# Patient Record
Sex: Female | Born: 1945 | Race: White | Hispanic: No | Smoking: Current every day smoker
Health system: Southern US, Community
[De-identification: ages and names within clinical notes are randomized; demographics above are authoritative.]

## PROBLEM LIST (undated history)

## (undated) DIAGNOSIS — I739 Peripheral vascular disease, unspecified: Secondary | ICD-10-CM

## (undated) DIAGNOSIS — T8859XA Other complications of anesthesia, initial encounter: Secondary | ICD-10-CM

## (undated) DIAGNOSIS — H3530 Unspecified macular degeneration: Secondary | ICD-10-CM

## (undated) DIAGNOSIS — K5790 Diverticulosis of intestine, part unspecified, without perforation or abscess without bleeding: Secondary | ICD-10-CM

## (undated) DIAGNOSIS — D509 Iron deficiency anemia, unspecified: Secondary | ICD-10-CM

## (undated) DIAGNOSIS — K2270 Barrett's esophagus without dysplasia: Secondary | ICD-10-CM

## (undated) DIAGNOSIS — K439 Ventral hernia without obstruction or gangrene: Secondary | ICD-10-CM

## (undated) DIAGNOSIS — Z86010 Personal history of colonic polyps: Secondary | ICD-10-CM

## (undated) DIAGNOSIS — R002 Palpitations: Secondary | ICD-10-CM

## (undated) DIAGNOSIS — Z972 Presence of dental prosthetic device (complete) (partial): Secondary | ICD-10-CM

## (undated) DIAGNOSIS — A0472 Enterocolitis due to Clostridium difficile, not specified as recurrent: Secondary | ICD-10-CM

## (undated) DIAGNOSIS — F419 Anxiety disorder, unspecified: Secondary | ICD-10-CM

## (undated) DIAGNOSIS — I4891 Unspecified atrial fibrillation: Secondary | ICD-10-CM

## (undated) DIAGNOSIS — G4700 Insomnia, unspecified: Secondary | ICD-10-CM

## (undated) DIAGNOSIS — K589 Irritable bowel syndrome without diarrhea: Secondary | ICD-10-CM

## (undated) DIAGNOSIS — I2510 Atherosclerotic heart disease of native coronary artery without angina pectoris: Secondary | ICD-10-CM

## (undated) DIAGNOSIS — I6529 Occlusion and stenosis of unspecified carotid artery: Secondary | ICD-10-CM

## (undated) DIAGNOSIS — I779 Disorder of arteries and arterioles, unspecified: Secondary | ICD-10-CM

## (undated) DIAGNOSIS — Z87440 Personal history of urinary (tract) infections: Secondary | ICD-10-CM

## (undated) DIAGNOSIS — F32A Depression, unspecified: Secondary | ICD-10-CM

## (undated) DIAGNOSIS — R011 Cardiac murmur, unspecified: Secondary | ICD-10-CM

## (undated) DIAGNOSIS — M47819 Spondylosis without myelopathy or radiculopathy, site unspecified: Secondary | ICD-10-CM

## (undated) DIAGNOSIS — N281 Cyst of kidney, acquired: Secondary | ICD-10-CM

## (undated) DIAGNOSIS — H409 Unspecified glaucoma: Secondary | ICD-10-CM

## (undated) DIAGNOSIS — Z860101 Personal history of adenomatous and serrated colon polyps: Secondary | ICD-10-CM

## (undated) DIAGNOSIS — Z7982 Long term (current) use of aspirin: Secondary | ICD-10-CM

## (undated) DIAGNOSIS — E119 Type 2 diabetes mellitus without complications: Secondary | ICD-10-CM

## (undated) DIAGNOSIS — J45909 Unspecified asthma, uncomplicated: Secondary | ICD-10-CM

## (undated) DIAGNOSIS — R7303 Prediabetes: Secondary | ICD-10-CM

## (undated) DIAGNOSIS — I700 Atherosclerosis of aorta: Secondary | ICD-10-CM

## (undated) DIAGNOSIS — E785 Hyperlipidemia, unspecified: Secondary | ICD-10-CM

## (undated) DIAGNOSIS — M51379 Other intervertebral disc degeneration, lumbosacral region without mention of lumbar back pain or lower extremity pain: Secondary | ICD-10-CM

## (undated) DIAGNOSIS — J449 Chronic obstructive pulmonary disease, unspecified: Secondary | ICD-10-CM

## (undated) DIAGNOSIS — M1990 Unspecified osteoarthritis, unspecified site: Secondary | ICD-10-CM

## (undated) DIAGNOSIS — K219 Gastro-esophageal reflux disease without esophagitis: Secondary | ICD-10-CM

## (undated) DIAGNOSIS — Q2730 Arteriovenous malformation, site unspecified: Secondary | ICD-10-CM

## (undated) DIAGNOSIS — R3129 Other microscopic hematuria: Secondary | ICD-10-CM

## (undated) HISTORY — DX: Cyst of kidney, acquired: N28.1

## (undated) HISTORY — DX: Enterocolitis due to Clostridium difficile, not specified as recurrent: A04.72

## (undated) HISTORY — PX: BUNIONECTOMY: SHX129

## (undated) HISTORY — DX: Arteriovenous malformation, site unspecified: Q27.30

## (undated) HISTORY — DX: Barrett's esophagus without dysplasia: K22.70

## (undated) HISTORY — DX: Gastro-esophageal reflux disease without esophagitis: K21.9

## (undated) HISTORY — DX: Personal history of adenomatous and serrated colon polyps: Z86.0101

## (undated) HISTORY — PX: UPPER GASTROINTESTINAL ENDOSCOPY: SHX188

## (undated) HISTORY — DX: Insomnia, unspecified: G47.00

## (undated) HISTORY — PX: CHOLECYSTECTOMY: SHX55

## (undated) HISTORY — DX: Diverticulosis of intestine, part unspecified, without perforation or abscess without bleeding: K57.90

## (undated) HISTORY — DX: Cardiac murmur, unspecified: R01.1

## (undated) HISTORY — PX: TUBAL LIGATION: SHX77

## (undated) HISTORY — DX: Personal history of colonic polyps: Z86.010

## (undated) HISTORY — DX: Peripheral vascular disease, unspecified: I73.9

## (undated) HISTORY — DX: Iron deficiency anemia, unspecified: D50.9

## (undated) HISTORY — PX: CERVICAL CONE BIOPSY: SUR198

## (undated) HISTORY — DX: Irritable bowel syndrome, unspecified: K58.9

## (undated) HISTORY — PX: CATARACT EXTRACTION: SUR2

## (undated) HISTORY — DX: Hyperlipidemia, unspecified: E78.5

## (undated) HISTORY — DX: Other microscopic hematuria: R31.29

## (undated) HISTORY — DX: Occlusion and stenosis of unspecified carotid artery: I65.29

## (undated) HISTORY — PX: COLONOSCOPY: SHX174

## (undated) HISTORY — PX: FOOT SURGERY: SHX648

---

## 2004-05-20 ENCOUNTER — Ambulatory Visit: Payer: Self-pay | Admitting: Internal Medicine

## 2005-06-11 ENCOUNTER — Ambulatory Visit: Payer: Self-pay | Admitting: Internal Medicine

## 2005-08-03 ENCOUNTER — Ambulatory Visit: Payer: Self-pay | Admitting: Internal Medicine

## 2005-08-09 DIAGNOSIS — K5520 Angiodysplasia of colon without hemorrhage: Secondary | ICD-10-CM

## 2005-08-09 HISTORY — DX: Angiodysplasia of colon without hemorrhage: K55.20

## 2005-09-17 ENCOUNTER — Ambulatory Visit: Payer: Self-pay | Admitting: Gastroenterology

## 2005-09-17 ENCOUNTER — Encounter (INDEPENDENT_AMBULATORY_CARE_PROVIDER_SITE_OTHER): Payer: Self-pay | Admitting: *Deleted

## 2005-11-05 ENCOUNTER — Ambulatory Visit: Payer: Self-pay | Admitting: Internal Medicine

## 2005-11-18 ENCOUNTER — Ambulatory Visit: Payer: Self-pay | Admitting: Internal Medicine

## 2006-02-05 ENCOUNTER — Ambulatory Visit: Payer: Self-pay | Admitting: General Practice

## 2006-05-24 ENCOUNTER — Encounter (INDEPENDENT_AMBULATORY_CARE_PROVIDER_SITE_OTHER): Payer: Self-pay | Admitting: *Deleted

## 2006-08-05 ENCOUNTER — Ambulatory Visit: Payer: Self-pay | Admitting: Internal Medicine

## 2007-06-29 ENCOUNTER — Ambulatory Visit: Payer: Self-pay | Admitting: Internal Medicine

## 2007-07-11 ENCOUNTER — Ambulatory Visit: Payer: Self-pay | Admitting: Internal Medicine

## 2007-07-11 ENCOUNTER — Encounter: Payer: Self-pay | Admitting: Internal Medicine

## 2007-08-08 ENCOUNTER — Ambulatory Visit: Payer: Self-pay | Admitting: Internal Medicine

## 2007-08-28 ENCOUNTER — Ambulatory Visit: Payer: Self-pay | Admitting: Internal Medicine

## 2007-08-31 LAB — CONVERTED CEMR LAB
Basophils Absolute: 0.1 10*3/uL (ref 0.0–0.1)
Basophils Relative: 0.6 % (ref 0.0–1.0)
Eosinophils Absolute: 0.1 10*3/uL (ref 0.0–0.6)
Eosinophils Relative: 1.6 % (ref 0.0–5.0)
HCT: 37.9 % (ref 36.0–46.0)
Hemoglobin: 13.3 g/dL (ref 12.0–15.0)
Iron: 62 ug/dL (ref 42–145)
Lymphocytes Relative: 21.9 % (ref 12.0–46.0)
MCHC: 35.1 g/dL (ref 30.0–36.0)
MCV: 94 fL (ref 78.0–100.0)
Monocytes Absolute: 0.7 10*3/uL (ref 0.2–0.7)
Monocytes Relative: 8.7 % (ref 3.0–11.0)
Neutro Abs: 5.7 10*3/uL (ref 1.4–7.7)
Neutrophils Relative %: 67.2 % (ref 43.0–77.0)
Platelets: 387 10*3/uL (ref 150–400)
RBC: 4.03 M/uL (ref 3.87–5.11)
RDW: 12.7 % (ref 11.5–14.6)
Saturation Ratios: 16.5 % — ABNORMAL LOW (ref 20.0–50.0)
Transferrin: 269 mg/dL (ref >=212.0)
WBC: 8.5 10*3/uL (ref 4.5–10.5)

## 2007-09-05 DIAGNOSIS — D509 Iron deficiency anemia, unspecified: Secondary | ICD-10-CM | POA: Insufficient documentation

## 2007-09-05 DIAGNOSIS — Q279 Congenital malformation of peripheral vascular system, unspecified: Secondary | ICD-10-CM | POA: Insufficient documentation

## 2007-09-05 DIAGNOSIS — K2270 Barrett's esophagus without dysplasia: Secondary | ICD-10-CM | POA: Insufficient documentation

## 2007-09-05 DIAGNOSIS — K5730 Diverticulosis of large intestine without perforation or abscess without bleeding: Secondary | ICD-10-CM | POA: Insufficient documentation

## 2007-09-05 DIAGNOSIS — K589 Irritable bowel syndrome without diarrhea: Secondary | ICD-10-CM | POA: Insufficient documentation

## 2007-09-05 DIAGNOSIS — D126 Benign neoplasm of colon, unspecified: Secondary | ICD-10-CM | POA: Insufficient documentation

## 2007-09-05 DIAGNOSIS — E785 Hyperlipidemia, unspecified: Secondary | ICD-10-CM | POA: Insufficient documentation

## 2007-10-12 ENCOUNTER — Ambulatory Visit: Payer: Self-pay | Admitting: Internal Medicine

## 2007-10-12 LAB — CONVERTED CEMR LAB
Basophils Absolute: 0 10*3/uL (ref 0.0–0.1)
Basophils Relative: 0.3 % (ref 0.0–1.0)
Eosinophils Absolute: 0.2 10*3/uL (ref 0.0–0.6)
Eosinophils Relative: 1.5 % (ref 0.0–5.0)
HCT: 40.1 % (ref 36.0–46.0)
Hemoglobin: 13.5 g/dL (ref 12.0–15.0)
Lymphocytes Relative: 21.2 % (ref 12.0–46.0)
MCHC: 33.8 g/dL (ref 30.0–36.0)
MCV: 92.7 fL (ref 78.0–100.0)
Monocytes Absolute: 0.8 10*3/uL — ABNORMAL HIGH (ref 0.2–0.7)
Monocytes Relative: 7.8 % (ref 3.0–11.0)
Neutro Abs: 7 10*3/uL (ref 1.4–7.7)
Neutrophils Relative %: 69.2 % (ref 43.0–77.0)
Platelets: 379 10*3/uL (ref 150–400)
RBC: 4.33 M/uL (ref 3.87–5.11)
RDW: 13.5 % (ref 11.5–14.6)
WBC: 10.1 10*3/uL (ref 4.5–10.5)

## 2008-01-09 ENCOUNTER — Ambulatory Visit: Payer: Self-pay | Admitting: Internal Medicine

## 2008-01-12 LAB — CONVERTED CEMR LAB
Basophils Absolute: 0.1 10*3/uL (ref 0.0–0.1)
Basophils Relative: 1.4 % — ABNORMAL HIGH (ref 0.0–1.0)
Eosinophils Absolute: 0.2 10*3/uL (ref 0.0–0.7)
Eosinophils Relative: 2.4 % (ref 0.0–5.0)
HCT: 41 % (ref 36.0–46.0)
Hemoglobin: 14.3 g/dL (ref 12.0–15.0)
Lymphocytes Relative: 17.7 % (ref 12.0–46.0)
MCHC: 34.9 g/dL (ref 30.0–36.0)
MCV: 95.8 fL (ref 78.0–100.0)
Monocytes Absolute: 0.8 10*3/uL (ref 0.1–1.0)
Monocytes Relative: 8.8 % (ref 3.0–12.0)
Neutro Abs: 6.1 10*3/uL (ref 1.4–7.7)
Neutrophils Relative %: 69.7 % (ref 43.0–77.0)
Platelets: 373 10*3/uL (ref 150–400)
RBC: 4.28 M/uL (ref 3.87–5.11)
RDW: 12.8 % (ref 11.5–14.6)
WBC: 8.7 10*3/uL (ref 4.5–10.5)

## 2008-04-04 ENCOUNTER — Ambulatory Visit: Payer: Self-pay | Admitting: Urology

## 2008-08-22 ENCOUNTER — Ambulatory Visit: Payer: Self-pay | Admitting: Internal Medicine

## 2008-08-26 ENCOUNTER — Ambulatory Visit: Payer: Self-pay | Admitting: Internal Medicine

## 2008-08-26 LAB — CONVERTED CEMR LAB
Basophils Absolute: 0 10*3/uL (ref 0.0–0.1)
Basophils Relative: 0.2 % (ref 0.0–3.0)
Eosinophils Absolute: 0.2 10*3/uL (ref 0.0–0.7)
Eosinophils Relative: 1.9 % (ref 0.0–5.0)
HCT: 39.9 % (ref 36.0–46.0)
Hemoglobin: 13.6 g/dL (ref 12.0–15.0)
Lymphocytes Relative: 18.2 % (ref 12.0–46.0)
MCHC: 34 g/dL (ref 30.0–36.0)
MCV: 94.1 fL (ref 78.0–100.0)
Monocytes Absolute: 0.9 10*3/uL (ref 0.1–1.0)
Monocytes Relative: 10 % (ref 3.0–12.0)
Neutro Abs: 5.9 10*3/uL (ref 1.4–7.7)
Neutrophils Relative %: 69.7 % (ref 43.0–77.0)
Platelets: 384 10*3/uL (ref 150–400)
RBC: 4.24 M/uL (ref 3.87–5.11)
RDW: 13.5 % (ref 11.5–14.6)
WBC: 8.5 10*3/uL (ref 4.5–10.5)

## 2008-08-28 ENCOUNTER — Ambulatory Visit: Payer: Self-pay | Admitting: Internal Medicine

## 2009-06-04 ENCOUNTER — Encounter (INDEPENDENT_AMBULATORY_CARE_PROVIDER_SITE_OTHER): Payer: Self-pay | Admitting: *Deleted

## 2009-07-08 ENCOUNTER — Ambulatory Visit: Payer: Self-pay | Admitting: Internal Medicine

## 2009-08-15 ENCOUNTER — Telehealth: Payer: Self-pay | Admitting: Internal Medicine

## 2009-08-15 ENCOUNTER — Ambulatory Visit: Payer: Self-pay | Admitting: Internal Medicine

## 2009-08-19 ENCOUNTER — Encounter: Payer: Self-pay | Admitting: Internal Medicine

## 2009-08-25 ENCOUNTER — Ambulatory Visit: Payer: Self-pay | Admitting: Internal Medicine

## 2010-09-08 NOTE — Procedures (Signed)
Summary: Gastroenterology EGD  Gastroenterology EGD   Imported By: Darcey Nora RN 09/06/2007 10:29:49  _____________________________________________________________________  External Attachment:    Type:   Image     Comment:   External Document

## 2010-09-08 NOTE — Procedures (Signed)
Summary: Gastroenterology-Capsule Endo  Gastroenterology-Capsule Endo   Imported By: Hortense Ramal CMA 09/02/2008 08:50:22  _____________________________________________________________________  External Attachment:    Type:   Image     Comment:   External Document

## 2010-09-08 NOTE — Procedures (Signed)
Summary: Colonoscopy  Patient: Yolanda Jimenez Note: All result statuses are Final unless otherwise noted.  Tests: (1) Colonoscopy (COL)   COL Colonoscopy           DONE (C)     [facility]     [address]     [place], [place]  [address]           COLONOSCOPY PROCEDURE REPORT           PATIENT:  Yolanda Jimenez, Yolanda Jimenez  MR#:  #[id number]     BIRTHDATE:  10-05-1945, 63 yrs. old  GENDER:           ENDOSCOPIST:  [name]. Juanda Chance, MD     Referred by:  Bethann Punches, M.D.           PROCEDURE DATE:  08/15/2009     PROCEDURE:  Colonoscopy [address]     ASA CLASS:  Class I     INDICATIONS:  history of pre-cancerous (adenomatous) colon polyps     adenomatous polyp in 2007     diarrhea, likely post-cholecystectomy           MEDICATIONS:   There was residual sedation effect present from     prior procedure. 3 mg, Fentanyl 25 mcg           DESCRIPTION OF PROCEDURE:   After the risks benefits and     alternatives of the procedure were thoroughly explained, informed     consent was obtained.  Digital rectal exam was performed and     revealed no rectal masses.   The LB [id number] endoscope     was introduced through the anus and advanced to the cecum, which     was identified by both the appendix and ileocecal valve, without     limitations.  The quality of the prep was good, using MiraLax.     The instrument was then slowly withdrawn as the colon was fully     examined.     <<PROCEDUREIMAGES>>           FINDINGS:  A sessile polyp was found in the ascending colon. 1 cm     sessile polyp removed from right colon Polyp was snared without     cautery. Retrieval was successful (see image7 and image8). snare     polyp  Moderate diverticulosis was found (see image9 and image1).     This was otherwise a normal examination of the colon. Random     biopsies were obtained and sent to pathology (see image2, image3,     image4, image5, and image10). r/o microscopic colitis     Retroflexed views in  the rectum revealed no abnormalities.    The     scope was then withdrawn from the patient and the procedure     completed.           COMPLICATIONS:  None           ENDOSCOPIC IMPRESSION:     1) Sessile polyp in the ascending colon     2) Moderate diverticulosis     3) Otherwise normal examination     s/p random biopsies to r/o microscopic colitis     RECOMMENDATIONS:     1) Await pathology results     Questran 4 gm qd, #30 packets, 3 refills           REPEAT EXAM:  In 5 year(s) for.  ______________________________     [name]. Juanda Chance, MD           CC:           n.     REVISED:  09/01/2009 09:58 AM     eSIGNED:   [name]. Coreon Simkins at 09/01/2009 09:58 AM           Page 2 of 3   Aleda Grana, #[id number] L3  Note: An exclamation mark (!) indicates a result that was not dispersed into the flowsheet. Document Creation Date: 09/01/2009 9:58 AM _______________________________________________________________________  (1) Order result status: Final Collection or observation date-time: 08/15/2009 11:22 Requested date-time:  Receipt date-time:  Reported date-time:  Referring Physician:   Ordering Physician: [name] [phone]) Specimen Source:  Source: Launa Grill Order Number: [id number] Lab site:

## 2010-09-08 NOTE — Assessment & Plan Note (Signed)
Summary: consult before ecl diarrhea, frequent heartburn..em    History of Present Illness Visit Type: Follow-up Visit Primary GI MD: [name] MD Primary Provider: Bethann Punches, MD Chief Complaint: Patient is here for consult before ECL. She is having frequent loose stools mostly in the morning. Takes 5 trips to the bathroom to complete a BM to get her day started. She has some increased reflux symptoms and is needing to take some Tums at night as needd. She has noticed a questionable hernia upper abd she woul like Dr Juanda Chance to take a look at today. She denies any abdominal pain. History of Present Illness:   This is a 65 year old white female with Barrett's esophagus which was diagnosed on an upper endoscopy in [place] in 2007 and confirmed on a subsequent upper endoscopy in December 2008 at our office. 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 Patient had iron deficiency anemia which was corrected with iron supplements. Patient's hemoglobin done every 3 months in 2009 remained stable at 13.3-14.3. She has eliminated Motrin and anti-inflammatory medications. She continues to smoke. Patient has diarrhea since her cholecystectomy in 1997. She takes Prilosec 20 mg in the morning and another one sometimes at night. Patient comes today to discuss having an endoscopy and colonoscopy. She complains of frequent loose stools and increased reflux symptoms. She denies any abdominal discomfort or blood in the stool.    GI Review of Systems    Reports acid reflux.      Denies abdominal pain, belching, bloating, chest pain, dysphagia with liquids, dysphagia with solids, heartburn, loss of appetite, nausea, vomiting, vomiting blood, weight loss, and  weight gain.        Denies anal fissure, black tarry stools, change in bowel habit, constipation, diarrhea, diverticulosis, fecal incontinence, heme positive stool, hemorrhoids, irritable bowel syndrome,  jaundice, light color stool, liver problems, rectal bleeding, and  rectal pain.  Preventive Screening-Counseling & Management  Alcohol-Tobacco     Smoking Status: quit    Current Medications (verified): 1)  Prilosec Otc 20 Mg Tbec (Omeprazole Magnesium) .... Take 1 Tablet By Mouth Once A Day and One At Night As Needed 2)  Simvastatin 40 Mg Tabs (Simvastatin) .... Take One By Mouth Once Daily 3)  Vitamin D 1000 Unit  Tabs (Cholecalciferol) .... Take One By Mouth Once Daily 4)  Vitamin C 1000 Mg Tabs (Ascorbic Acid) .... Take One By Mouth Once Daily 5)  Calcium Carbonate-Vitamin D 600-400 Mg-Unit  Tabs (Calcium Carbonate-Vitamin D) .... Take One By Mouth Once Daily 6)  Vesicare 5 Mg Tabs (Solifenacin Succinate) .... Take One By Mouth Once Daily 7)  Magnesium 500 Mg Tabs (Magnesium) .... Take One By Mouth Once Daily 8)  Tums 500 Mg Chew (Calcium Carbonate Antacid) .... Take One By Mouth As Needed  Allergies (verified): 1)  ! Codeine 2)  ! 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 quit November 13 2008 Smoking Status:  quit  Review of Systems  The patient complains of back pain, muscle pains/cramps, and urine leakage.  The patient denies allergy/sinus, anemia, anxiety-new, arthritis/joint pain, blood in urine, breast changes/lumps, change in vision,  confusion, cough, coughing up blood, depression-new, fainting, fatigue, fever, headaches-new, hearing problems, heart murmur, heart rhythm changes, itching, menstrual pain, night sweats, nosebleeds, pregnancy symptoms, shortness of breath, skin rash, sleeping problems, sore throat, swelling of feet/legs, swollen lymph glands, thirst - excessive , urination - excessive , urination changes/pain, vision changes, and voice change.         Pertinent positive and negative review of systems were noted in the above HPI. All other ROS was otherwise negative.   Vital Signs:  Patient profile:   65 year old female Height:      62 inches Weight:      152.2 pounds BMI:     27.94 Pulse rate:   68 / minute Pulse rhythm:   regular BP sitting:   124 / 74  (left arm) Cuff size:   regular  Vitals Entered By: Harlow Mares CMA Duncan Dull) (July 08, 2009 2:19 PM)  Physical Exam  General:  alert, oriented and in no distress. Eyes:  PERRLA, no icterus. Mouth:  No deformity or lesions, dentition normal. Neck:  Supple; no masses or thyromegaly. Lungs:  Clear throughout to auscultation. Heart:  Regular rate and rhythm; no murmurs, rubs,  or bruits. Abdomen:  small ventral hernia in the midline in epigastrium 2-3 cm in diameter overlying the post laparoscopic cholecystectomy scar. It is easily reducible and it is not tender. Extremities:  No clubbing, cyanosis, edema or deformities noted.   Impression & Recommendations:  Problem # 1:  COLONIC POLYPS, ADENOMATOUS (ICD-211.3)  Patient has a history of adenomatous polyps of the colon on several prior colonoscopies. The last one was done in February 2007. Patient is due for a repeat colonoscopy.  Orders: Colon/Endo (Colon/Endo)  Problem # 2:  BARRETTS ESOPHAGUS (ICD-530.85)  Patient had Barrett's esophagus on her last upper endoscopy and biopsies from December 2008. She is due for a repeat endoscopy. She needs to continue Prilosec 20 mg twice a  day.  Orders: Colon/Endo (Colon/Endo)  Patient Instructions: 1)  Prilosec 20 mg p.o. b.i.d. 2)  Schedule upper endoscopies and biopsies for followup of Barrett's. 3)  Schedule colonoscopy for followup of adenomatous colon polyps. 4)  Copy sent to : Dr Bethann Punches Prescriptions: DULCOLAX 5 MG  TBEC (BISACODYL) Day before procedure take 2 at 3pm and 2 at 8pm.  #4 x 0   Entered by:   Hortense Ramal CMA (AAMA)   Authorized by:   Hart Carwin MD   Signed by:   Hortense Ramal CMA (AAMA) on 07/08/2009   Method used:   Electronically to        [facility] [phone]* (retail)       [address] [place], [place]  [address]       Ph: [phone] or [phone]       Fax: [phone]   RxID:   [id number] REGLAN 10 MG  TABS (METOCLOPRAMIDE HCL) As per prep instructions.  #2 x 0   Entered by:   Hortense Ramal CMA (AAMA)   Authorized by:   Hart Carwin MD   Signed by:   Hortense Ramal CMA (AAMA) on 07/08/2009   Method used:   [facility]  [address] [phone]* (retail)       [address] [place], [place]  [address]       Ph: [phone] or [phone]       Fax: [phone]   RxID:   [id number] MIRALAX   POWD (POLYETHYLENE GLYCOL 3350) As per prep  instructions.  #255gm x 0   Entered by:   Hortense Ramal CMA (AAMA)   Authorized by:   Hart Carwin MD   Signed by:   Hortense Ramal CMA (AAMA) on 07/08/2009   Method used:   Electronically to        [facility] [phone]* (retail)       [address] [place], [place]  [address]       Ph: [phone] or [phone]       Fax: [phone]   RxID:   [id number] PRILOSEC OTC 20 MG TBEC (OMEPRAZOLE MAGNESIUM) Take 1 tablet by mouth two times a day  #60 x 4   Entered by:   Hortense Ramal CMA (AAMA)   Authorized by:   Hart Carwin MD   Signed by:   Hortense Ramal CMA (AAMA) on 07/08/2009   Method used:   Electronically to        [facility] [phone]* (retail)       [address] [place], [place]  [address]       Ph: [phone] or [phone]       Fax: [phone]   RxID:   [id number]

## 2010-09-08 NOTE — Letter (Signed)
Summary: [facility] Instructions  [facility]  [address] [place], [place] [address]   Phone: [phone]  Fax: [phone]       Yolanda Jimenez    May 03, 1946    MRN: [id number]       Procedure Day /Date: Friday 08/15/09     Arrival Time: 8:30 am     Procedure Time: 9:30 am     Location of Procedure:                    _x_  [facility] (4th Floor)   PREPARATION FOR COLONOSCOPY WITH MIRALAX  Starting 5 days prior to your procedure _(08/08/09) do not eat nuts, seeds, popcorn, corn, beans, peas,  salads, or any raw vegetables.  Do not take any fiber supplements (e.g. Metamucil, Citrucel, and Benefiber). ____________________________________________________________________________________________________   THE DAY BEFORE YOUR PROCEDURE         DATE: 08/14/09 DAY: Thursday  1   Drink clear liquids the entire day-NO SOLID FOOD  2   Do not drink anything colored red or purple.  Avoid juices with pulp.  No orange juice.  3   Drink at least 64 oz. (8 glasses) of fluid/clear liquids during the day to prevent dehydration and help the prep work efficiently.  CLEAR LIQUIDS INCLUDE: Water Jello Ice Popsicles Tea (sugar ok, no milk/cream) Powdered fruit flavored drinks Coffee (sugar ok, no milk/cream) Gatorade Juice: apple, white grape, white cranberry  Lemonade Clear bullion, consomm, broth Carbonated beverages (any kind) Strained chicken noodle soup Hard Candy  4   Mix the entire bottle of Miralax with 64 oz. of Gatorade/Powerade in the morning and put in the refrigerator to chill.  5   At 3:00 pm take 2 Dulcolax/Bisacodyl tablets.  6   At 4:30 pm take one Reglan/Metoclopramide tablet.  7  Starting at 5:00 pm drink one 8 oz glass of the Miralax mixture every 15-20 minutes until you have finished drinking the entire 64 oz.  You should finish drinking prep around 7:30 or 8:00 pm.  8   If you are nauseated, you may take the 2nd Reglan/Metoclopramide tablet  at 6:30 pm.        9    At 8:00 pm take 2 more DULCOLAX/Bisacodyl tablets.       THE DAY OF YOUR PROCEDURE      DATE:  08/15/09 DAY: Friday  You may drink clear liquids until 7:30 am  (2 HOURS BEFORE PROCEDURE).   MEDICATION INSTRUCTIONS  Unless otherwise instructed, you should take regular prescription medications with a small sip of water as early as possible the morning of your procedure.         OTHER INSTRUCTIONS  You will need a responsible adult at least 67 years of age to accompany you and drive you home.   This person must remain in the waiting room during your procedure.  Wear loose fitting clothing that is easily removed.  Leave jewelry and other valuables at home.  However, you may wish to bring a book to read or an iPod/MP3 player to listen to music as you wait for your procedure to start.  Remove all body piercing jewelry and leave at home.  Total time from sign-in until discharge is approximately 2-3 hours.  You should go home directly after your procedure and rest.  You can resume normal activities the day after your procedure.  The day of your procedure you should not:   Drive   Make  legal decisions   Operate machinery   Drink alcohol   Return to work  You will receive specific instructions about eating, activities and medications before you leave.   The above instructions have been reviewed and explained to me by   Hortense Ramal, CMA    I fully understand and can verbalize these instructions _____________________________ Date 07/08/09

## 2010-09-08 NOTE — Letter (Signed)
Summary: Endo/Colon Letter  [facility]  [address] [place], [place] [address]   Phone: [phone]  Fax: [phone]      June 04, 2009 MRN: [id number]   Yolanda Jimenez [address] [place], [place]  [address]   Dear Ms. Werst,   According to your medical record, it is time for you to schedule an Endoscopy/Colonoscopy . Endoscopic screeening is recommended for patients with certain upper digestive tract conditions because of associated increased risk for cancers of the upper digestive system. The American Cancer Society recommends Colonoscopy as a method to detect early colon cancer. Patients with a family history of colon cancer, or a personal history of colon polyps or inflammatory bowel disease are at increased risk.  This letter has been generated based on the recommendations made at the time of your prior procedure. If you feel that in your particular situation this may no longer apply, please contact our office.  Please call our office at [phone] to schedule this appointment or to update your records at your earliest convenience.  Thank you for cooperating with [place] to provide you with the very best care possible.   Sincerely,  [name]. Juanda Chance, M.D.  [facility] [phone]

## 2010-09-08 NOTE — Letter (Signed)
Summary: Patient Notice-Barrett's [facility]  [address] [place], [place] [address]   Phone: [phone]  Fax: [phone]    x    August 19, 2009 MRN: [id number]    Yolanda Jimenez [address] [place], [place]  [address]    Dear Yolanda Jimenez,  I am pleased to inform you that the biopsies taken during your recent endoscopic examination did not show any evidence of cancer upon pathologic examination.  However, your biopsies indicate you have a condition known as Barrett's esophagus. While not cancer, it is pre-cancerous (can progress to cancer) and needs to be monitored with repeat endoscopic examination and biopsies.  Fortunately, it is quite rare that this develops into cancer, but careful monitoring of the condition along with taking your medication as prescribed is important in reducing the risk of developing cancer.  It is my recommendation that you have a repeat upper gastrointestinal endoscopic examination in _ years.  Additional information/recommendations:  __Please call [phone] to schedule a return visit to further      evaluate your condition.  __Continue with treatment plan as outlined the day of your exam.  Please call us if you have or develop heartburn, reflux symptoms, any swallowing problems, or if you have questions about your condition that have not been fully answered at this time.  Sincerely,  Hart Carwin MD  This letter has been electronically signed by your physician.  Appended Document: Patient Notice-Barrett's Esopghagus August 19, 2009 MRN: [id number]    [facility] [address] [place], [place]  [address]     Dear Yolanda Jimenez,  I am pleased to inform you that the biopsies taken during your recent endoscopic examination did not show any evidence of cancer upon pathologic examination.  However, your biopsies indicate you have a condition known as Barrett's esophagus. While not cancer, it is  pre-cancerous (can progress to cancer) and needs to be monitored with repeat endoscopic examination and biopsies.  Fortunately, it is quite rare that this develops into cancer, but careful monitoring of the condition along with taking your medication as prescribed is important in reducing the risk of developing cancer.  It is my recommendation that you have a repeat upper gastrointestinal endoscopic examination in 2 years.  Additional information/recommendations:  __Please call [phone] to schedule a return visit to further      evaluate your condition.  __Continue with treatment plan as outlined the day of your exam.  Please call us if you have or develop heartburn, reflux symptoms, any swallowing problems, or if you have questions about your condition that have not been fully answered at this time.  Sincerely,  Hart Carwin MD  This letter has been electronically signed by your physician.   Letter mailed 01.12.11

## 2010-09-08 NOTE — Letter (Signed)
Summary: Patient Notice- Colon Biospy Results  [facility]  [address] [place], [place] [address]   Phone: [phone]  Fax: [phone]        August 19, 2009 MRN: [id number]    TAMZIN BERTLING [address] [place], [place]  [address]    Dear Ms. Bhat,  I am pleased to inform you that the biopsies taken during your recent colonoscopy did not show any evidence of cancer upon pathologic examination.The polyp was adenomatous ( precancerous)  Additional information/recommendations:  _x_No further action is needed at this time.  Please follow-up with      your primary care physician for your other healthcare needs.  __Please call [phone] to schedule a return visit to review      your condition.  __Continue with the treatment plan as outlined on the day of your      exam.  _x_You should have a repeat colonoscopy examination for this problem           in _5  years.  Please call us if you are having persistent problems or have questions about your condition that have not been fully answered at this time.  Sincerely,  Hart Carwin MD   This letter has been electronically signed by your physician.  Appended Document: Patient Notice- Colon Biospy Results Letter mailed 01.12.11

## 2010-09-08 NOTE — Progress Notes (Signed)
Summary: pain in abdomen, passed blood   Phone Note Call from Patient   Caller: Patient Summary of Call: colonoscopy and egd today, notes reviewed some moderate crampy lower abd pain, T99, passed mucous with streaks, clump of blood, about a teaspoon no back pain ate a hamburger earlier rec: clear liquids, observe, notify me if T> 100.5 or worse if ok tomorrow resume normal diet for her Initial call taken by: Iva Boop MD, Clementeen Graham,  August 15, 2009 7:45 PM

## 2010-09-08 NOTE — Procedures (Signed)
Summary: Colonoscopy  Patient: Yolanda Jimenez Note: All result statuses are Final unless otherwise noted.  Tests: (1) Colonoscopy (COL)   COL Colonoscopy           DONE     [facility]     [address]     [place], [place]  [address]           COLONOSCOPY PROCEDURE REPORT           PATIENT:  Yolanda Jimenez, Yolanda Jimenez  MR#:  #[id number] L3     BIRTHDATE:  ,  yrs. old  GENDER:           ENDOSCOPIST:  [name]. Juanda Chance, MD     Referred by:  Bethann Punches, M.D.           PROCEDURE DATE:  08/15/2009     PROCEDURE:  Colonoscopy 347[address]     ASA CLASS:  Class I     INDICATIONS:  history of pre-cancerous (adenomatous) colon polyps     adenomatous polyp in 2007     diarrhea, likely post-cholecystectomy           MEDICATIONS:   There was residual sedation effect present from     prior procedure. 8 mg, Fentanyl 75 mcg           DESCRIPTION OF PROCEDURE:   After the risks benefits and     alternatives of the procedure were thoroughly explained, informed     consent was obtained.  Digital rectal exam was performed and     revealed no rectal masses.   The LB CF-H180AL [id number] endoscope     was introduced through the anus and advanced to the cecum, which     was identified by both the appendix and ileocecal valve, without     limitations.  The quality of the prep was good, using MiraLax.     The instrument was then slowly withdrawn as the colon was fully     examined.     <<PROCEDUREIMAGES>>           FINDINGS:  A sessile polyp was found in the ascending colon. 1 cm     sessile polyp removed from right colon Polyp was snared without     cautery. Retrieval was successful (see image7 and image8). snare     polyp  Moderate diverticulosis was found (see image9 and image1).     This was otherwise a normal examination of the colon. Random     biopsies were obtained and sent to pathology (see image2, image3,     image4, image5, and image10). r/o microscopic colitis     Retroflexed views in the rectum  revealed no abnormalities.    The     scope was then withdrawn from the patient and the procedure     completed.           COMPLICATIONS:  None           ENDOSCOPIC IMPRESSION:     1) Sessile polyp in the ascending colon     2) Moderate diverticulosis     3) Otherwise normal examination     s/p random biopsies to r/o microscopic colitis     RECOMMENDATIONS:     1) Await pathology results     Questran 4 gm qd, #30 packets, 3 refills           REPEAT EXAM:  In 5 year(s) for.           ______________________________  [name]. Juanda Chance, MD           CC:           n.     eSIGNED:   [name]. Brodie at 08/15/2009 11:46 AM           Page 2 of 3   Aleda Grana, #[id number] L3  Note: An exclamation mark (!) indicates a result that was not dispersed into the flowsheet. Document Creation Date: 08/15/2009 2:50 PM _______________________________________________________________________  (1) Order result status: Final Collection or observation date-time: 08/15/2009 11:22 Requested date-time:  Receipt date-time:  Reported date-time:  Referring Physician:   Ordering Physician: [name] [phone]) Specimen Source:  Source: Launa Grill Order Number: [id number] Lab site:   Appended Document: Colonoscopy     Procedures Next Due Date:    Colonoscopy: 08/2014

## 2010-09-08 NOTE — Miscellaneous (Signed)
Summary: RX Questran  Clinical Lists Changes  Medications: Added new medication of QUESTRAN 4 GM  PACK (CHOLESTYRAMINE) 1 packet once daily - Signed Rx of QUESTRAN 4 GM  PACK (CHOLESTYRAMINE) 1 packet once daily;  #30 x 3;  Signed;  Entered by: Sherren Kerns RN;  Authorized by: Hart Carwin MD;  Method used: [facility] to [facility]. [phone]*, [address], [place], [place], [place]  [address], Ph: [phone] or [phone], Fax: [phone] Observations: Added new observation of ALLERGY REV: Done (08/15/2009 11:55)    Prescriptions: QUESTRAN 4 GM  PACK (CHOLESTYRAMINE) 1 packet once daily  #30 x 3   Entered by:   Sherren Kerns RN   Authorized by:   Hart Carwin MD   Signed by:   Sherren Kerns RN on 08/15/2009   Method used:   [facility]ronically to        [facility] [phone] (retail)       [address] [place], [place]  [address]       Ph: [phone] or [phone]       Fax: [phone]   RxID:   [id number]

## 2010-09-08 NOTE — Procedures (Signed)
Summary: Upper Endoscopy  Patient: Yolanda Jimenez Note: All result statuses are Final unless otherwise noted.  Tests: (1) Upper Endoscopy (EGD)   EGD Upper Endoscopy       DONE     [facility]     [address]     [place], [place]  [address]           ENDOSCOPY PROCEDURE REPORT           PATIENT:  [name], [name]  MR#:  [id number]     BIRTHDATE:  11-14-1945, 63 yrs. old  GENDER:  female           ENDOSCOPIST:  [name]. Juanda Chance, MD     Referred by:  Bethann Punches, M.D.           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  The LB GIF-H180 [id number] endoscope was     introduced through the mouth and advanced to the second portion of     the duodenum, without limitations.  The instrument was slowly     withdrawn as the mucosa was fully examined.     <<PROCEDUREIMAGES>>           Normal GE junction was noted. Four quadrant biopsies were taken     every 35 cm. through the length of the barretts.  A sessile polyp     was found in the fundus. fundic gland polyp With standard forceps,     a biopsy was obtained and sent to pathology (see image3).     Otherwise the examination was normal (see image1 and image2).     Retroflexed views revealed no abnormalities.    The scope was then     withdrawn from the patient and the procedure completed.           COMPLICATIONS:  None           ENDOSCOPIC IMPRESSION:     1) Normal GE junction     2) Sessile polyp in the fundus     3) Otherwise normal examination    s/p biopsies from the normal appearing g-e junction     RECOMMENDATIONS:     1) await pathology results     continue Prelosec 20 mg po qd           REPEAT EXAM:  In 2 - 3 year(s) for Colonoscopy.           ______________________________     [name]. Juanda Chance, MD           CC:           n.     eSIGNED:   [name]. Christianjames Soule at 08/15/2009 11:32 AM  [name], [name], [phone]  Note: An exclamation mark (!) indicates a result that was not dispersed into the flowsheet. Document Creation Date: 08/15/2009 11:29 AM _______________________________________________________________________  (1) Order result status: Final Collection or observation date-time: 08/15/2009 11:02 Requested date-time:  Receipt date-time:  Reported date-time:  Referring Physician:   Ordering Physician: [name] [phone]) Specimen Source:  Source: Launa Grill Order Number: [id number] Lab site:   Appended Document: Upper Endoscopy     Procedures Next Due Date:    EGD: 08/2011

## 2010-09-11 ENCOUNTER — Ambulatory Visit: Payer: Self-pay | Admitting: Internal Medicine

## 2010-12-22 NOTE — Assessment & Plan Note (Signed)
Yolanda Jimenez                         GASTROENTEROLOGY OFFICE NOTE   NAME:Yolanda Jimenez, Yolanda Jimenez                         MRN:          [id number]  DATE:06/29/2007                            DOB:          1946/04/10    NEW PATIENT EVALUATION:  Yolanda Jimenez is a very nice 65 year old white  female who seeks another opinion of her AV malformations and Barrett's  esophagus.  She was found to be iron deficient approximately a year and  a half ago.  Hemoglobin less than 10, heme positive stool.  She  underwent GI evaluation by Dr. Trecia Rogers in [place].  Upper endoscopy  showed grade II esophagitis.  Biopsies of the GE junction showed  intestinal metaplasia, consistent with Barrett's esophagus.  There were  no blood cells on the biopsies.  The pathologist indicated that the  diagnosis of Barrett's would depend on where the biopsies were taken  from.   Her colonoscopy showed adenomatous polyps of the colon and mild  diverticulosis.  There was no evidence of colitis, only clear site of GI  blood loss.  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  I am not sure how many of them had  been seen.  Patient at the time of the capsule was taking up to six  ibuprofen a day.  She since then discontinued her ibuprofen and has  continued her iron supplement with complete resolution of her anemia.  Her last hemoglobin was 14 gm with 55% iron saturation.  She denies any  symptoms of gastroesophageal reflux and has never really had problems  with reflux or upper GI discomfort.  She was put on Prilosec on the  basis of the Barrett's esophagus, which she continues to take on a daily  basis.  She also has occasional diarrhea suggestive of irritable bowel  syndrome.  Her bowel habits are usually normal except for occasional  morning urgency and diarrhea, resulting in about 3-4 loose bowel  movements.  She has Bentyl 10 mg to control it.   She denies any rectal  bleeding.   MEDICATIONS:  1. Iron supplements 2-3 times a week.  2. Omeprazole 20 mg p.o. daily.  3. Lovastatin 40 mg p.o. daily.   PAST MEDICAL HISTORY:  Significant for high cholesterol.  She had a  cholecystectomy in 1997, tubal ligation in 1955.   FAMILY HISTORY:  Positive for diverticulitis in his father, heart  disease in mother, and diabetes in mother and her son.   SOCIAL HISTORY:  She is divorced.  Has college education.  She works as  a Child psychotherapist.  She smokes less than a pack of cigarettes a day.  Drinks wine socially.   REVIEW OF SYSTEMS:  Positive for eye glasses, back pain, leakage of  urine.   PHYSICAL EXAMINATION:  Blood pressure 122/80, pulse 82, weight 140  pounds.  __________.  Oral cavity was normal.  NECK:  Supple without adenopathy.  LUNGS:  Clear to auscultation.  COR:  Normal S1 and S2.  ABDOMEN:  Soft, nontender with normoactive bowel sounds.  No distention.  No palpable mass or fullness.  RECTAL:  Scant hemoccult negative stool.   IMPRESSION:  54. A 65 year old white female with diagnosis of intestinal metaplasia      on biopsies of the gastroesophageal junction, consistent with      Barrett's esophagus.  This was in February, 2007.  She is due for      repeat endoscopy in February, 2009.  According to her      gastroenterologist, but patient denies any symptoms of      gastroesophageal reflux.  She said she was never told that she had      Barrett's esophagus and was surprised when we brought it up.  2. History of arteriovenous malformation diagnosis made by small bowel      capsule endoscopy.  Patient is not sure how reliable is this      diagnosis, since she was on a lot of ibuprofen at the time of the      exam.  3. History of iron-deficiency anemia, heme-positive stool now      corrected with iron supplements.  4. History of adenomatous polyp of the polyp on colonoscopy in      February, 2007.  Next colonoscopy in  February, 2010.  5. History of irritable bowel syndrome and mild diverticulosis of the      left colon.  Patient on Benefiber and Bentyl p.r.n.   PLAN:  1. Repeat upper endoscopy.  Will re-biopsy the GE junction.  If she      truly has Barrett's esophagus and globoid cells at the GE junction.      She will have to stay on PPI indefinitely.  However, if her      biopsies are negative and she remains asymptomatic, I do not see      any reason to continue the Prilosec.  2. Stop smoking.  3. Continue Benefiber and antispasmodics.  4. Patient will repeat colonoscopy in February, 2010.  5. As far as her AV malformations are concerned, I would like her to      stop taking her iron supplements now and watch her hemoglobin and      hematocrit every 2-3 months.  If it keeps drifting down, it would      indicate that she has continued GI blood loss; however, if it stays      the same, and her stools remain hemoccult negative, the diagnosis      of AV malformation would be in doubt.     [name]. Juanda Chance, MD  Electronically Signed    DMB/MedQ  DD: 06/29/2007  DT: 06/29/2007  Job #: [id number]   cc:   Bethann Punches

## 2010-12-22 NOTE — Assessment & Plan Note (Signed)
[facility]                         GASTROENTEROLOGY OFFICE NOTE   NAME:Yolanda Jimenez, Yolanda Jimenez                         MRN:          [id number]  DATE:08/28/2007                            DOB:          [birth year]    Ms. Yolanda Jimenez is a 64 year old white female who has Barrett's esophagus.  This was confirmed on the second upper endoscopy on July 11, 2007.  First endoscopy being in 2007 in [place].  She also has a history of  iron deficiency anemia and AV malformations found on small bowel capsule  endoscopy in October 2007.  She is a smoker.  She has a history of  tubular adenoma of her colon found on colonoscopy in February 2007.  Her  next colonoscopy is scheduled for 3 years which would be 2010.  There is  a history of cholecystectomy, 1997, and bilateral tubal ligation, 1985.   MEDICATIONS:  1. Omeprazole 20 mg daily.  2. Simvastatin 40 mg daily.  3. Vitamin D 1000 mg a day.  4. She is also on Benefiber.   PHYSICAL EXAMINATION:  Blood pressure 120/70, pulse 72, and weight 138  pounds.  The patient was not reexamined today.   We have talked about Barrett's esophagus, about the association with  gastroesophageal reflux, and need for her to stop smoking.  Also we  talked about antireflux measures and having her blood count rechecked  every 3 months.  Her insurance prefers that she take Prilosec OTC 20 mg  dispense 42 one a day which is associated with only $5 co pay.   PLAN:  1. Prilosec 20 mg over the counter dispense 42, [facility]      [facility].  2. CBC and iron studies today.  3. Recall endoscopy and colonoscopy in December 2010.     [name]. Juanda Chance, MD  Electronically Signed    DMB/MedQ  DD: 08/28/2007  DT: 08/28/2007  Job #: [id number]   cc:   Bethann Punches

## 2011-01-13 ENCOUNTER — Other Ambulatory Visit (INDEPENDENT_AMBULATORY_CARE_PROVIDER_SITE_OTHER): Payer: Medicare Other

## 2011-01-13 ENCOUNTER — Encounter: Payer: Self-pay | Admitting: Internal Medicine

## 2011-01-13 ENCOUNTER — Ambulatory Visit (INDEPENDENT_AMBULATORY_CARE_PROVIDER_SITE_OTHER): Payer: Medicare Other | Admitting: Internal Medicine

## 2011-01-13 VITALS — BP 138/72 | HR 66 | Ht 62.0 in | Wt 135.0 lb

## 2011-01-13 DIAGNOSIS — D509 Iron deficiency anemia, unspecified: Secondary | ICD-10-CM

## 2011-01-13 DIAGNOSIS — K5521 Angiodysplasia of colon with hemorrhage: Secondary | ICD-10-CM

## 2011-01-13 DIAGNOSIS — R195 Other fecal abnormalities: Secondary | ICD-10-CM

## 2011-01-13 LAB — CBC WITH DIFFERENTIAL/PLATELET
Basophils Absolute: 0.1 10*3/uL (ref 0.0–0.1)
Basophils Relative: 0.7 % (ref 0.0–3.0)
Eosinophils Absolute: 0.2 10*3/uL (ref 0.0–0.7)
Eosinophils Relative: 2 % (ref 0.0–5.0)
HCT: 38.9 % (ref 36.0–46.0)
Hemoglobin: 12.6 g/dL (ref 12.0–15.0)
Lymphocytes Relative: 26.6 % (ref 12.0–46.0)
Lymphs Abs: 2.1 10*3/uL (ref 0.7–4.0)
MCHC: 32.4 g/dL (ref 30.0–36.0)
MCV: 88.6 fl (ref 78.0–100.0)
Monocytes Absolute: 0.7 10*3/uL (ref 0.1–1.0)
Monocytes Relative: 9.6 % (ref 3.0–12.0)
Neutro Abs: 4.7 10*3/uL (ref 1.4–7.7)
Neutrophils Relative %: 61.1 % (ref 43.0–77.0)
Platelets: 350 10*3/uL (ref 150.0–400.0)
RBC: 4.39 Mil/uL (ref 3.87–5.11)
RDW: 22.6 % — ABNORMAL HIGH (ref 11.5–14.6)
WBC: 7.7 10*3/uL (ref 4.5–10.5)

## 2011-01-13 MED ORDER — INTEGRA PLUS PO CAPS: ORAL_CAPSULE | ORAL | Status: DC

## 2011-01-13 NOTE — Patient Instructions (Addendum)
Your physician has requested that you go to the basement for the following lab work before leaving today: CBC We have sent Integra to your pharmacy. Please take 1 capsule by mouth once daily.  Dr Loraine Leriche Hyacinth Meeker

## 2011-01-13 NOTE — Progress Notes (Signed)
Yolanda Jimenez 1945/10/24 MRN [id number]      History of Present Illness:  This is a 65 year old white female with Barrett's esophagus and a history of AV malformations documented on a small bowel capsule endoscopy. She comes in with recurrent anemia. Her hemoglobin is 9.3. She has a low ferritin and iron binding capacity. She had a complete colonoscopy and upper endoscopy in January 2011 with findings of Barrett's esophagus and an aenomatous polyp. She denies any visible blood per rectum. Prior endoscopic procedures were in 2007. She started on iron supplements 4 weeks ago but complains of change in bowel habits, having difficulty evacuating the stool.   Past Medical History  Diagnosis Date  . IBS (irritable bowel syndrome)   . Hyperlipidemia   . Diverticulosis   . Hx of adenomatous colonic polyps   . Barrett esophagus   . AVM (arteriovenous malformation)   . Iron deficiency anemia   . Microscopic hematuria   . PVD (peripheral vascular disease)   . Carotid stenosis   . Osteoporosis     mild  . Hypertension   . Insomnia    Past Surgical History  Procedure Date  . Cholecystectomy   . Cervical cone biopsy   . Bunionectomy     right foot  . Foot surgery     right 2nd toe  . Tubal ligation     reports that she quit smoking about 2 years ago. She does not have any smokeless tobacco history on file. She reports that she drinks alcohol. She reports that she does not use illicit drugs. family history includes Diabetes in her mother, sister, and son; Heart disease in her mother; and Inflammatory bowel disease in her father.  There is no history of Colon cancer. Allergies  Allergen Reactions  . Codeine   . Hydrocodone-Acetaminophen     delirium        Review of Systems: Denies dysphagia, odynophagia, chest pain or visible blood per rectum  The remainder of the 10  point ROS is negative except as outlined in H&P   Physical Exam: General appearance  Well developed, in no  distress. Eyes- non icteric. HEENT nontraumatic, normocephalic. Mouth no lesions, tongue papillated, no cheilosis. Neck supple without adenopathy, thyroid not enlarged, no carotid bruits, no JVD. Lungs Clear to auscultation bilaterally. Cor normal S1 normal S2, regular rhythm , no murmur,  quiet precordium. Abdomen soft nontender abdomen with normal active bowel sounds. Liver edge at posterior margin. Rectal: Soft Hemoccult positive stool. Extremities no pedal edema. Skin no lesions. Neurological alert and oriented x 3. Psychological normal mood and affect.  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 Patient stopped taking iron orally at our request after her hemoglobin responded to oral iron supplements. It appears that she will have to take the iron supplements indefinitely and have her hemoglobin checked on a regular basis. We will repeat her CBC today. She wants to switch to a different iron preparation. She has been given a prescription for Integra plus.  Problem #2 colon polyps on her last colonoscopy in January 2011. A recall colonoscopy will be due in January 2016.  Problem #3 Barrett's esophagus. This was confirmed on an upper endoscopy in January 2011. We will repeat her endoscopy in January 2013.   01/13/2011 [name]

## 2011-04-05 ENCOUNTER — Telehealth: Payer: Self-pay | Admitting: Internal Medicine

## 2011-04-05 NOTE — Telephone Encounter (Signed)
Spoke with patient and she is having problems with IBS and reflux. States that when she eats, 15-30 minutes later she has to go to the bathroom multiple times. The stool is soft with mucous in it. When she wipes, she has bright red blood on the tissue. She is also having abdominal pain below her belly button. For the last month, she is waking up with a sore throat. She went to her ENT and was told it is due to reflux. Her ENT increased her Prilosec to 2 in AM and PM for 2 weeks then BID. This has not helped her at all. She is requesting an OV. Scheduled with Mike Gip, PA on 04/07/11 at 8:30 AM. Hx Barrett's esophagus, IBS, AV malformations, anemia, Last colon, EGD Jan. 2011- Barrett's eso., adenomatous polyps. Last OV 01/13/11.

## 2011-04-06 NOTE — Telephone Encounter (Signed)
Reviewed and agree.

## 2011-04-07 ENCOUNTER — Ambulatory Visit (INDEPENDENT_AMBULATORY_CARE_PROVIDER_SITE_OTHER): Payer: Medicare Other | Admitting: Physician Assistant

## 2011-04-07 ENCOUNTER — Encounter: Payer: Self-pay | Admitting: Physician Assistant

## 2011-04-07 ENCOUNTER — Other Ambulatory Visit (INDEPENDENT_AMBULATORY_CARE_PROVIDER_SITE_OTHER): Payer: Medicare Other

## 2011-04-07 DIAGNOSIS — Q2730 Arteriovenous malformation, site unspecified: Secondary | ICD-10-CM

## 2011-04-07 DIAGNOSIS — R1011 Right upper quadrant pain: Secondary | ICD-10-CM

## 2011-04-07 DIAGNOSIS — K219 Gastro-esophageal reflux disease without esophagitis: Secondary | ICD-10-CM

## 2011-04-07 DIAGNOSIS — Q279 Congenital malformation of peripheral vascular system, unspecified: Secondary | ICD-10-CM

## 2011-04-07 LAB — CBC WITH DIFFERENTIAL/PLATELET
Basophils Absolute: 0.1 10*3/uL (ref 0.0–0.1)
Basophils Relative: 1 % (ref 0.0–3.0)
Eosinophils Absolute: 0.1 10*3/uL (ref 0.0–0.7)
Eosinophils Relative: 1.1 % (ref 0.0–5.0)
HCT: 43.1 % (ref 36.0–46.0)
Hemoglobin: 14.1 g/dL (ref 12.0–15.0)
Lymphocytes Relative: 18.7 % (ref 12.0–46.0)
Lymphs Abs: 1.5 10*3/uL (ref 0.7–4.0)
MCHC: 32.8 g/dL (ref 30.0–36.0)
MCV: 93.5 fl (ref 78.0–100.0)
Monocytes Absolute: 0.7 10*3/uL (ref 0.1–1.0)
Monocytes Relative: 7.9 % (ref 3.0–12.0)
Neutro Abs: 5.9 10*3/uL (ref 1.4–7.7)
Neutrophils Relative %: 71.3 % (ref 43.0–77.0)
Platelets: 372 10*3/uL (ref 150.0–400.0)
RBC: 4.61 Mil/uL (ref 3.87–5.11)
RDW: 15.4 % — ABNORMAL HIGH (ref 11.5–14.6)
WBC: 8.3 10*3/uL (ref 4.5–10.5)

## 2011-04-07 MED ORDER — CIPROFLOXACIN HCL 500 MG PO TABS: 500.0000 mg | ORAL_TABLET | Freq: Two times a day (BID) | ORAL | Status: AC

## 2011-04-07 MED ORDER — METRONIDAZOLE 500 MG PO TABS: 500.0000 mg | ORAL_TABLET | Freq: Two times a day (BID) | ORAL | Status: AC

## 2011-04-07 MED ORDER — DICYCLOMINE HCL 10 MG PO CAPS: ORAL_CAPSULE | ORAL | Status: DC

## 2011-04-07 MED ORDER — ESOMEPRAZOLE MAGNESIUM 40 MG PO CPDR: DELAYED_RELEASE_CAPSULE | ORAL | Status: DC

## 2011-04-07 NOTE — Patient Instructions (Signed)
We have given you samples of Nexium, take 1 capsule twice daily for 12-14 days. Call us if the Nexium helps and we will call in a prescription.  We sent 3 prescriptions to [facility] pharmacy [facility]. Call us back if the symptoms have not resolved. Follow up with Dr. Juanda Chance in 1 month.  Ask for an early morning appointment to avoid having to wait too long.

## 2011-04-08 ENCOUNTER — Telehealth: Payer: Self-pay | Admitting: *Deleted

## 2011-04-08 LAB — C-REACTIVE PROTEIN: CRP: 0.18 mg/dL (ref <0.6)

## 2011-04-08 NOTE — Telephone Encounter (Signed)
Message copied by Daphine Deutscher on Thu Apr 08, 2011  2:24 PM ------      Message from: Dierks, Virginia S      Created: Thu Apr 08, 2011  2:09 PM       Please let Tove know her hgb is normal

## 2011-04-08 NOTE — Telephone Encounter (Signed)
Patient notified of results.

## 2011-04-09 ENCOUNTER — Encounter: Payer: Self-pay | Admitting: Physician Assistant

## 2011-04-09 NOTE — Progress Notes (Signed)
Agree with initial assessment and plans 

## 2011-04-09 NOTE — Progress Notes (Signed)
Subjective:    Patient ID: Yolanda Jimenez, female    DOB: August 08, 1946, 65 y.o.   MRN: [id number]  HPI R. of his age 65 year old female known to Dr. Lina Jimenez with history of Barrett's esophagus and previously documented small bowel AVMs on  capsule endoscopy. She was last seen earlier this summer with a change in bowel habits after starting on iron supplements for her anemia which is felt related to be AVMs. She was switched to Integra plus at that time and plans were for recall colonoscopy in 2016.  At this time she had acute onset about 5 days  with diarrhea. She says with her IBS she generally will have loose stools in the mornings, however on the way home from the beach this past weekend  she had stopped 4 times with an acute diarrhea. Associated with this she has had cramping and lower abdominal discomfort and some mild bright blood on the tissue. Since that time she is also noted a lot of mucus and bile in the stool. She has not had any nausea or vomiting, no fever or chills. She says she's been scared to eat because within 15 minutes or so of eating she has to run to the bathroom.  She is also complaining of an increase in reflux symptoms. She has been on Prilosec once daily and after her recent visit to the ENT M.D. she was told that she had reflux causing her early morning sore throat. She was told she had her evidence of inflammation on her laryngoscopy  and has been on Prilosec 2,twice daily over the past 2 weeks. She did feel somewhat better on higher doses but is aware that she is having reflux symptoms and is concerned because of her Barrett's.    Review of Systems  Constitutional: Negative.   HENT: Positive for sore throat.   Eyes: Negative.   Respiratory: Negative.   Cardiovascular: Negative.   Gastrointestinal: Positive for nausea, abdominal pain and diarrhea.  Genitourinary: Negative.   Musculoskeletal: Negative.   Skin: Negative.   Neurological: Negative.   Hematological:  Negative.   Psychiatric/Behavioral: Negative.    Outpatient Prescriptions Prior to Visit  Medication Sig Dispense Refill  . Ascorbic Acid (VITAMIN C) 1000 MG tablet Take 1,000 mg by mouth daily.        . calcium carbonate 200 MG capsule Take 250 mg by mouth daily.        . Cholecalciferol (VITAMIN D) 1000 UNITS capsule Take 1,000 Units by mouth daily.        [name] omeprazole (PRILOSEC) 20 MG capsule Take 20 mg by mouth daily.       . simvastatin (ZOCOR) 40 MG tablet Take 40 mg by mouth at bedtime.        . temazepam (RESTORIL) 30 MG capsule Take 30 mg by mouth at bedtime as needed.        . FeFum-FePoly-FA-B Cmp-C-Biot (INTEGRA PLUS) CAPS Take 1 tablet by mouth once daily  30 capsule  2  . Ferrous Fumarate-Vitamin C (VITRON-C) 200-125 MG TABS Take 1 tablet by mouth daily.        . magnesium gluconate (MAGONATE) 500 MG tablet Take 500 mg by mouth daily.         Allergies  Allergen Reactions  . Codeine   . Hydrocodone-Acetaminophen     delirium        Objective:   Physical Exam Well-developed female in no acute distress, alert oriented x3, pleasant, HEENT; nontraumatic normocephalic EOMI PERRLA  sclera anicteric, Neck; supple no JVD, Cardiovascular; regular rate and rhythm with S1-S2 no murmur or gallop, Pulmonary; clear bilaterally, Abdomen; soft, bowel sounds active,    she has mild bilateral lower abdominal tenderness, More marked on the left, no guarding, no rebound ,no palpable mass or hepatosplenomegaly , Rectal; not done, Extremities no clubbing cyanosis or edema skin benign, Psych; mood and affect normal and appropriate.        Assessment & Plan:  #39 66 year old female with history of IBS now with acute diarrheal illness x5 days with lower abdominal pain and history of diverticulosis. Rule out acute infectious colitis, rule out diverticulitis though feel less likely.  Plan; Check CBC today Start dicyclomine 10 mg by mouth 2-3 times daily as needed for cramping and pain Start  empiric Cipro 500 mg twice a day x7 days and Flagyl 500 mg twice a day x7 days.  Patient is advised that if her symptoms worsen and/or persist she will need stool cultures, and reevaluation.  #2 Poorly controlled GERD with laryngeal inflammation. Will give trial of Nexium 40 mg twice daily before breakfast and before dinner for 2-3 weeks, samples given today and if effective we'll give prescription. We discussed upper endoscopy. She is due for followup screening early 2013. She will followup with Dr. Lina Jimenez in the office in 3-4 weeks and can decide at that time regarding proceeding with EGD

## 2011-04-21 ENCOUNTER — Telehealth: Payer: Self-pay | Admitting: Internal Medicine

## 2011-04-21 DIAGNOSIS — R197 Diarrhea, unspecified: Secondary | ICD-10-CM

## 2011-04-22 NOTE — Telephone Encounter (Signed)
Not doing well. Saw Amy Esterwood, PA 2 weeks ago and was put on 2 antibiotics, Bentyl and Nexium. Still has a lot of discomfort in stomach and it is sore. States her stools a mushy to liquid and dark in color except for this morning when it was light in color.  She is having multiple stools a day, especially after eating.She is seeing pieces of food in her stool. She is wanting to know if she should do stool samples prior to her OV with Dr. Juanda Chance on 05/14/11.

## 2011-04-22 NOTE — Telephone Encounter (Signed)
Spoke with patient and she will pick up stool sample containers today. She understands not to start Imodium until after obtaining samples.

## 2011-04-22 NOTE — Telephone Encounter (Signed)
Yes, please ask her do stool culture, cdiff pcr, stool o/p and lactoferrin. She may take immodium  2-3 x daily as needed after she brings in stool samples. thanks

## 2011-04-23 ENCOUNTER — Telehealth: Payer: Self-pay | Admitting: Internal Medicine

## 2011-04-23 ENCOUNTER — Other Ambulatory Visit: Payer: Medicare Other

## 2011-04-23 DIAGNOSIS — R197 Diarrhea, unspecified: Secondary | ICD-10-CM

## 2011-04-23 MED ORDER — ESOMEPRAZOLE MAGNESIUM 40 MG PO CPDR: DELAYED_RELEASE_CAPSULE | ORAL | Status: DC

## 2011-04-23 NOTE — Telephone Encounter (Signed)
Left message--samples out front 

## 2011-04-24 LAB — FECAL LACTOFERRIN, QUANT: Lactoferrin: POSITIVE

## 2011-04-26 ENCOUNTER — Telehealth: Payer: Self-pay | Admitting: *Deleted

## 2011-04-26 LAB — CLOSTRIDIUM DIFFICILE BY PCR: Toxigenic C. Difficile by PCR: DETECTED — CR

## 2011-04-26 LAB — OVA AND PARASITE SCREEN: OP: NONE SEEN

## 2011-04-26 MED ORDER — METRONIDAZOLE 250 MG PO TABS: ORAL_TABLET | ORAL | Status: DC

## 2011-04-26 MED ORDER — SACCHAROMYCES BOULARDII 250 MG PO CAPS: ORAL_CAPSULE | ORAL | Status: DC

## 2011-04-26 NOTE — Telephone Encounter (Signed)
Spoke with patient and gave her the results and recommendations. Rx sent to pharmacy. Patient will call on Thursday with update.

## 2011-04-26 NOTE — Telephone Encounter (Signed)
Message copied by Daphine Deutscher on Mon Apr 26, 2011  3:48 PM ------      Message from: Shubert, Virginia S      Created: Mon Apr 26, 2011  3:39 PM       Please call Yolanda Jimenez and let her know  She has +c. Diff-not sure why as she had not been on any antibiotics. She needs to start flagyl 250 mg 4 times daily x 14 days, and florastor twice daily x 3 weeks. Stop immodium for now. Ask her to call back on Thursday with a progress report. Lots of fluids, and no alcohol while on flagyl. thanks

## 2011-04-27 LAB — STOOL CULTURE

## 2011-04-30 ENCOUNTER — Telehealth: Payer: Self-pay | Admitting: *Deleted

## 2011-04-30 NOTE — Telephone Encounter (Signed)
Patient calling with an update on how she is doing. She is doing better. Only had one trip to the bathroom yesterday and two trips today. Still has urgency and bad odor.

## 2011-05-07 ENCOUNTER — Encounter: Payer: Self-pay | Admitting: *Deleted

## 2011-05-14 ENCOUNTER — Ambulatory Visit (INDEPENDENT_AMBULATORY_CARE_PROVIDER_SITE_OTHER): Payer: Medicare Other | Admitting: Internal Medicine

## 2011-05-14 ENCOUNTER — Other Ambulatory Visit (INDEPENDENT_AMBULATORY_CARE_PROVIDER_SITE_OTHER): Payer: Medicare Other

## 2011-05-14 ENCOUNTER — Other Ambulatory Visit: Payer: Self-pay | Admitting: Internal Medicine

## 2011-05-14 ENCOUNTER — Encounter: Payer: Self-pay | Admitting: Internal Medicine

## 2011-05-14 VITALS — BP 120/80 | HR 64 | Ht 62.0 in | Wt 131.2 lb

## 2011-05-14 DIAGNOSIS — A0472 Enterocolitis due to Clostridium difficile, not specified as recurrent: Secondary | ICD-10-CM

## 2011-05-14 DIAGNOSIS — K5521 Angiodysplasia of colon with hemorrhage: Secondary | ICD-10-CM

## 2011-05-14 DIAGNOSIS — K2270 Barrett's esophagus without dysplasia: Secondary | ICD-10-CM

## 2011-05-14 LAB — CBC WITH DIFFERENTIAL/PLATELET
Basophils Absolute: 0 10*3/uL (ref 0.0–0.1)
Basophils Relative: 0.5 % (ref 0.0–3.0)
Eosinophils Absolute: 0.1 10*3/uL (ref 0.0–0.7)
Eosinophils Relative: 1.1 % (ref 0.0–5.0)
HCT: 43.1 % (ref 36.0–46.0)
Hemoglobin: 14.3 g/dL (ref 12.0–15.0)
Lymphocytes Relative: 16.4 % (ref 12.0–46.0)
Lymphs Abs: 1.2 10*3/uL (ref 0.7–4.0)
MCHC: 33.2 g/dL (ref 30.0–36.0)
MCV: 96.4 fl (ref 78.0–100.0)
Monocytes Absolute: 0.7 10*3/uL (ref 0.1–1.0)
Monocytes Relative: 8.9 % (ref 3.0–12.0)
Neutro Abs: 5.4 10*3/uL (ref 1.4–7.7)
Neutrophils Relative %: 73.1 % (ref 43.0–77.0)
Platelets: 363 10*3/uL (ref 150.0–400.0)
RBC: 4.47 Mil/uL (ref 3.87–5.11)
RDW: 14.8 % — ABNORMAL HIGH (ref 11.5–14.6)
WBC: 7.4 10*3/uL (ref 4.5–10.5)

## 2011-05-14 LAB — IBC PANEL
Iron: 166 ug/dL — ABNORMAL HIGH (ref 42–145)
Saturation Ratios: 53.9 % — ABNORMAL HIGH (ref 20.0–50.0)
Transferrin: 220.1 mg/dL (ref 212.0–360.0)

## 2011-05-14 MED ORDER — MAGIC MOUTHWASH: ORAL | Status: DC

## 2011-05-14 MED ORDER — OMEPRAZOLE 20 MG PO CPDR: DELAYED_RELEASE_CAPSULE | ORAL | Status: DC

## 2011-05-14 NOTE — Patient Instructions (Addendum)
You have been scheduled for an endoscopy. Please follow written instructions given to you at your visit today. We have sent the following medications to your pharmacy for you to pick up at your convenience: Prilosec 20 mg. Take 2 tablets by mouth twice daily (you requested we give you 20 mg instead of 40 mg due to insurance coverage) Magic Mouthwash. Swish and Swallow 5 cc by mouth four times daily x 1 week. Your physician has requested that you go to the basement for the following lab work before leaving today: CBC, CMET CC:Dr Bethann Punches

## 2011-05-14 NOTE — Progress Notes (Signed)
Yolanda Jimenez 10/30/1945 MRN [id number]    History of Present Illness:  This is a 65 year old white female, with a recent history of pseudomembranous colitis which has responded to Flagyl. It started after she was treated for suspected diverticulitis with Cipro and Flagyl. Her C. difficile toxin was positive. She responded to Flagyl after discontinuation of Cipro. Her only complaint today is a coated tongue, and a bad taste in her mouth as well as the continued  gastroesophageal reflux. She was diagnosed with Barrett's esophagus on an upper endoscopy in January 2011. She is currently on Prilosec 20 mg twice a day with only partial relief of her symptoms. She could not afford Nexium. She has a history of AV malformations taking iron supplements as well as aspirin 81 mg 2 times a week. Her last hemoglobin on August 29 was 14.1.   Past Medical History  Diagnosis Date  . IBS (irritable bowel syndrome)   . Hyperlipidemia   . Diverticulosis   . Hx of adenomatous colonic polyps   . Barrett esophagus   . AVM (arteriovenous malformation)   . Iron deficiency anemia   . Microscopic hematuria   . PVD (peripheral vascular disease)   . Carotid stenosis   . Osteoporosis     mild  . Hypertension   . Insomnia   . C. difficile diarrhea    Past Surgical History  Procedure Date  . Cholecystectomy   . Cervical cone biopsy   . Bunionectomy     right foot  . Foot surgery     right 2nd toe  . Tubal ligation     reports that she has been smoking Cigarettes.  She has been smoking about .5 packs per day. She does not have any smokeless tobacco history on file. She reports that she drinks alcohol. She reports that she does not use illicit drugs. family history includes Diabetes in her mother, sister, and son; Heart disease in her mother; Hypertension in her mother and son; Inflammatory bowel disease in her father; and Multiple myeloma in her father.  There is no history of Colon cancer. Allergies    Allergen Reactions  . Codeine   . Hydrocodone-Acetaminophen     delirium        Review of Systems:  The remainder of the 10 point ROS is negative except as outlined in H&P   Physical Exam: General appearance  Well developed, in no distress. Eyes- non icteric. HEENT nontraumatic, normocephalic. Tongue is coated. Mouth no lesion, no cheilosis. Neck supple without adenopathy, thyroid not enlarged, no carotid bruits, no JVD. Lungs Clear to auscultation bilaterally. Cor normal S1, normal S2, regular rhythm, no murmur,  quiet precordium. Abdomen: Soft abdomen with normal active bowel sounds. No distention minimal tenderness left lower quadrant. Liver edge at costal margin. Rectal: Soft Hemoccult-positive stool. Extremities no pedal edema. Skin no lesions. Neurological alert and oriented x 3. Psychological normal mood and affect.  Assessment and Plan:  Problem #1 C. difficile colitis. Patient is currently asymptomatic. She will continue probiotics, one a day for 2 weeks then discontinue. We will add Magic mouthwash 4 times a day for one week.  Problem #2 gastroesophageal reflux with Barrett's esophagus. She is interested in repeating her upper endoscopy at this time. We will increase her Prilosec to 40 mg by mouth twice a day.  Problem #3 AVMs. We will check a CBC and iron studies today. She needs to continue on aspirin because of vascular disease.   05/14/2011 Yolanda Jimenez

## 2011-05-17 ENCOUNTER — Telehealth: Payer: Self-pay

## 2011-05-17 NOTE — Telephone Encounter (Signed)
Message copied by Michele Mcalpine on Mon May 17, 2011  8:16 AM ------      Message from: Hart Carwin      Created: Sat May 15, 2011  3:20 PM       Please call pt with normal iron level and CBC

## 2011-05-17 NOTE — Telephone Encounter (Signed)
Pt aware.

## 2011-06-01 ENCOUNTER — Encounter: Payer: Self-pay | Admitting: Internal Medicine

## 2011-06-01 ENCOUNTER — Ambulatory Visit (AMBULATORY_SURGERY_CENTER): Payer: Medicare Other | Admitting: Internal Medicine

## 2011-06-01 VITALS — BP 143/77 | HR 63 | Temp 98.2°F | Resp 16 | Ht 62.0 in | Wt 131.0 lb

## 2011-06-01 DIAGNOSIS — K2270 Barrett's esophagus without dysplasia: Secondary | ICD-10-CM

## 2011-06-01 MED ORDER — SODIUM CHLORIDE 0.9 % IV SOLN: 500.0000 mL | INTRAVENOUS | Status: DC

## 2011-06-01 NOTE — Patient Instructions (Signed)
FOLLOW DISCHARGE INSTRUCTIONS (BLUE & GREEN SHEETS)    ANTIREFLUX INFORMATION GIVEN TO YOU   CONTINUE PPI (ANTIREFLUX MEDICATION )

## 2011-06-02 ENCOUNTER — Telehealth: Payer: Self-pay | Admitting: *Deleted

## 2011-06-02 NOTE — Telephone Encounter (Signed)

## 2011-06-07 ENCOUNTER — Encounter: Payer: Self-pay | Admitting: Internal Medicine

## 2011-09-30 ENCOUNTER — Ambulatory Visit: Payer: Self-pay | Admitting: Internal Medicine

## 2011-10-08 ENCOUNTER — Other Ambulatory Visit: Payer: Self-pay | Admitting: Internal Medicine

## 2012-02-17 ENCOUNTER — Ambulatory Visit: Payer: Self-pay | Admitting: Internal Medicine

## 2012-03-15 ENCOUNTER — Other Ambulatory Visit: Payer: Self-pay | Admitting: Internal Medicine

## 2012-10-02 ENCOUNTER — Ambulatory Visit: Payer: Self-pay | Admitting: Internal Medicine

## 2012-12-08 ENCOUNTER — Other Ambulatory Visit: Payer: Self-pay | Admitting: *Deleted

## 2012-12-08 MED ORDER — OMEPRAZOLE MAGNESIUM 20 MG PO TBEC: DELAYED_RELEASE_TABLET | ORAL | Status: DC

## 2013-02-26 ENCOUNTER — Telehealth: Payer: Self-pay | Admitting: Internal Medicine

## 2013-02-26 NOTE — Telephone Encounter (Signed)
Spoke with patient and scheduled her on 03/05/13 at 10:00 AM with Doug Sou, PA.

## 2013-03-05 ENCOUNTER — Ambulatory Visit (INDEPENDENT_AMBULATORY_CARE_PROVIDER_SITE_OTHER): Payer: Medicare Other | Admitting: Gastroenterology

## 2013-03-05 ENCOUNTER — Encounter: Payer: Self-pay | Admitting: Gastroenterology

## 2013-03-05 VITALS — BP 106/70 | HR 64 | Ht 61.0 in | Wt 128.5 lb

## 2013-03-05 DIAGNOSIS — R197 Diarrhea, unspecified: Secondary | ICD-10-CM

## 2013-03-05 DIAGNOSIS — D509 Iron deficiency anemia, unspecified: Secondary | ICD-10-CM

## 2013-03-05 DIAGNOSIS — K589 Irritable bowel syndrome without diarrhea: Secondary | ICD-10-CM

## 2013-03-05 NOTE — Progress Notes (Signed)
Reviewed and agree.

## 2013-03-05 NOTE — Progress Notes (Signed)
03/05/2013 Yolanda Jimenez [id number] 05-16-1946   History of Present Illness:  Patient is a pleasant 67 year old female who is a patient of Dr. Regino Schultze.  She follows with her for GERD/Barrett's and IBS with diarrhea.    Patient was put on my schedule today to discuss issues with the diarrhea.  She has intermittent diarrhea that is unpredictable.  Does not correlate with her diet per se (thinks that she may have some component of lactose intolerance and switched to almond milk, but still eating other dairy containing products).  Had an episode of incontinence with explosive diarrhea last Monday.  Since then she has been taking one Imodium daily.  Did not have a BM for 4 days, but now had three days in a row with solid, formed BM.  No abdominal pain or bloody stool.  Last colonoscopy was 08/2009 at which time random biopsies were negative for microscopic colitis.  She also had one polyp that was a TA with recommended repeat in 5 years from that time.  Has GERD and Barretts.  Is due for EGD in October for her two year recommended surveillance.  Takes prilosec 40 mg daily and a second one if needed.    Recent labs reveal normal Hgb of 12.8 grams, but ferritin was low at 6.  Is only taking ferrous gluconate, one tablet two times a week at this time.    Current Medications, Allergies, Past Medical History, Past Surgical History, Family History and Social History were reviewed in [employer] record.   Physical Exam: Ht 5\' 1"  (1.549 m)  Wt 128 lb 8 oz (58.287 kg)  BMI 24.29 kg/m2 General: Well developed, white female in no acute distress Head: Normocephalic and atraumatic Eyes:  sclerae anicteric, conjunctiva pink  Ears: Normal auditory acuity Lungs: Clear throughout to auscultation Heart: Regular rate and rhythm Abdomen: Soft, non-tender and non-distended. No masses, no hepatomegaly.  Hyperactive BS. Musculoskeletal: Symmetrical with no gross deformities  Extremities: No  edema  Neurological: Alert oriented x 4, grossly nonfocal Psychological:  Alert and cooperative. Normal mood and affect  Assessment and Recommendations: -Diarrhea:  Intermittent issue for several years.  Likely component of IBS and bile-salt diarrhea with possibly some lactose intolerance as well.  She is doing well with taking only one Imodium daily and is having formed bowel movements.  She can absolutely continue this regimen.  We will give her a lactose intolerance diet to try as well. -GERD/Barrett's:  Due for EGD in October.  Will return to see Dr. Juanda Chance at that time and schedule procedure. -Iron deficiency:  Hgb normal on recent BW but ferritin low.  She has only been taking her iron supplements twice a week, therefore, I recommended that she begin taking them daily.  Also, I recommended ferrous sulfate 325 mg as the recommended supplement.

## 2013-03-05 NOTE — Patient Instructions (Addendum)
You have been given a separate informational sheet regarding your tobacco use, the importance of quitting and local resources to help you quit.  Continue your Imodium one every day to control the diarrhea.  Take iron every day, consider switching to Ferrous Sulfate 325 mg (65 FE) which is over the counter.  Today we are giving you information on Lactose Intolerance to read and follow.  Make a follow up appointment with Dr. [name] for Oct. 2014.  I appreciate the opportunity to care for you.

## 2013-04-16 ENCOUNTER — Other Ambulatory Visit: Payer: Self-pay

## 2013-04-16 MED ORDER — OMEPRAZOLE 40 MG PO CPDR: 40.0000 mg | DELAYED_RELEASE_CAPSULE | Freq: Every day | ORAL | Status: DC

## 2013-05-18 ENCOUNTER — Other Ambulatory Visit (INDEPENDENT_AMBULATORY_CARE_PROVIDER_SITE_OTHER): Payer: Medicare Other

## 2013-05-18 ENCOUNTER — Ambulatory Visit: Payer: Medicare Other | Admitting: Internal Medicine

## 2013-05-18 ENCOUNTER — Encounter: Payer: Self-pay | Admitting: Internal Medicine

## 2013-05-18 ENCOUNTER — Ambulatory Visit (INDEPENDENT_AMBULATORY_CARE_PROVIDER_SITE_OTHER): Payer: Medicare Other | Admitting: Internal Medicine

## 2013-05-18 VITALS — BP 126/74 | HR 68 | Ht 61.0 in | Wt 126.2 lb

## 2013-05-18 DIAGNOSIS — K2270 Barrett's esophagus without dysplasia: Secondary | ICD-10-CM

## 2013-05-18 LAB — CBC WITH DIFFERENTIAL/PLATELET
Basophils Absolute: 0 10*3/uL (ref 0.0–0.1)
Basophils Relative: 0.5 % (ref 0.0–3.0)
Eosinophils Absolute: 0.2 10*3/uL (ref 0.0–0.7)
Eosinophils Relative: 1.8 % (ref 0.0–5.0)
HCT: 40.3 % (ref 36.0–46.0)
Hemoglobin: 13.3 g/dL (ref 12.0–15.0)
Lymphocytes Relative: 23.9 % (ref 12.0–46.0)
Lymphs Abs: 2.1 10*3/uL (ref 0.7–4.0)
MCHC: 32.8 g/dL (ref 30.0–36.0)
MCV: 91.6 fl (ref 78.0–100.0)
Monocytes Absolute: 0.7 10*3/uL (ref 0.1–1.0)
Monocytes Relative: 7.6 % (ref 3.0–12.0)
Neutro Abs: 5.9 10*3/uL (ref 1.4–7.7)
Neutrophils Relative %: 66.2 % (ref 43.0–77.0)
Platelets: 363 10*3/uL (ref 150.0–400.0)
RBC: 4.4 Mil/uL (ref 3.87–5.11)
RDW: 16.5 % — ABNORMAL HIGH (ref 11.5–14.6)
WBC: 8.9 10*3/uL (ref 4.5–10.5)

## 2013-05-18 LAB — IBC PANEL
Iron: 102 ug/dL (ref 42–145)
Saturation Ratios: 26.7 % (ref 20.0–50.0)
Transferrin: 272.4 mg/dL (ref 212.0–360.0)

## 2013-05-18 NOTE — Patient Instructions (Addendum)
You have been given a separate informational sheet regarding your tobacco use, the importance of quitting and local resources to help you quit.  You have been scheduled for an endoscopy with propofol. Please follow written instructions given to you at your visit today. If you use inhalers (even only as needed), please bring them with you on the day of your procedure. Your physician has requested that you go to www.startemmi.com and enter the access code given to you at your visit today. This web site gives a general overview about your procedure. However, you should still follow specific instructions given to you by our office regarding your preparation for the procedure.  Your physician has requested that you go to the basement for the following lab work before leaving today: Urine for HIAA  CC: Dr Bethann Punches

## 2013-05-18 NOTE — Progress Notes (Signed)
Yolanda Jimenez 09/05/1945 MRN [id number]   History of Present Illness:  This is a 67 year old white female with Barrett's esophagus and chronic diarrhea. She has a history of AVMs causing chronic GI blood loss. The AVMs showed on a small bowel capsule endoscopy. Her last appointment was in July 2014. She continues to have diarrhea unless she takes Imodium every other day. Colonoscopies in 2007 and in January 2011 showed tubular adenomas. She will be due for a recall colonoscopy in January 2016. Her last upper endoscopy in October 2012 showed Barrett's esophagus. She has been on iron supplements. She is due to have her blood count rechecked today.   Past Medical History  Diagnosis Date  . IBS (irritable bowel syndrome)   . Hyperlipidemia   . Diverticulosis   . Hx of adenomatous colonic polyps   . Barrett esophagus   . AVM (arteriovenous malformation)   . Iron deficiency anemia   . Microscopic hematuria   . PVD (peripheral vascular disease)   . Carotid stenosis   . Osteoporosis     mild  . Hypertension   . Insomnia   . C. difficile diarrhea   . GERD (gastroesophageal reflux disease)    Past Surgical History  Procedure Laterality Date  . Cholecystectomy    . Cervical cone biopsy    . Bunionectomy      right foot  . Foot surgery      right 2nd toe  . Tubal ligation      reports that she has been smoking Cigarettes.  She has been smoking about 0.50 packs per day. She has never used smokeless tobacco. She reports that she drinks alcohol. She reports that she does not use illicit drugs. family history includes Diabetes in her maternal grandmother, mother, sister, and son; Heart disease in her mother; Hypertension in her mother and son; Inflammatory bowel disease in her father; Multiple myeloma in her father. There is no history of Colon cancer. Allergies  Allergen Reactions  . Codeine         Review of Systems: Denies dysphagia, odynophagia, abdominal pain  The remainder of the  10 point ROS is negative except as outlined in H&P   Physical Exam: General appearance  Well developed, in no distress. Eyes- non icteric. HEENT nontraumatic, normocephalic. Mouth no lesions, tongue papillated, no cheilosis. Neck supple without adenopathy, thyroid not enlarged, no carotid bruits, no JVD. Lungs Clear to auscultation bilaterally. Cor normal S1, normal S2, regular rhythm, no murmur,  quiet precordium. Abdomen: Normal active bowel sounds. Soft with minimal tenderness left lower quadrant. Rectal: Small amount of Hemoccult negative stool. Extremities no pedal edema. Skin no lesions. Neurological alert and oriented x 3. Psychological normal mood and affect.  Assessment and Plan:  Problem #8 67 year old white female with Barrett's esophagus who is due for a recall upper endoscopy. This will be scheduled.   Problem #2 Colorectal screening. Patient has a history of a tubular adenoma in 2011. A recall colonoscopy will be due in January 2016.  Problem #3 Chronic diarrhea. We will check a sprue profile today and 24-hour urine for HIAA. She will continue Imodium.  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 She is to continue iron supplements. She is Hemoccult-negative today. We will recheck her CBC and iron studies.   05/18/2013 Yolanda Jimenez

## 2013-05-21 ENCOUNTER — Encounter: Payer: Self-pay | Admitting: Internal Medicine

## 2013-05-21 LAB — CELIAC PANEL 10
Endomysial Screen: NEGATIVE
Gliadin IgA: 7.5 U/mL (ref <20)
Gliadin IgG: 6.9 U/mL (ref <20)
IgA: 148 mg/dL (ref 69–380)
Tissue Transglut Ab: 7.9 U/mL (ref <20)
Tissue Transglutaminase Ab, IgA: 2 U/mL (ref <20)

## 2013-06-01 ENCOUNTER — Other Ambulatory Visit: Payer: Self-pay

## 2013-06-01 ENCOUNTER — Ambulatory Visit (AMBULATORY_SURGERY_CENTER): Payer: Medicare Other | Admitting: Internal Medicine

## 2013-06-01 ENCOUNTER — Encounter: Payer: Self-pay | Admitting: Internal Medicine

## 2013-06-01 VITALS — BP 138/91 | HR 58 | Temp 97.5°F | Resp 19 | Ht 61.0 in | Wt 126.0 lb

## 2013-06-01 DIAGNOSIS — K209 Esophagitis, unspecified without bleeding: Secondary | ICD-10-CM

## 2013-06-01 DIAGNOSIS — D1330 Benign neoplasm of unspecified part of small intestine: Secondary | ICD-10-CM

## 2013-06-01 DIAGNOSIS — K2270 Barrett's esophagus without dysplasia: Secondary | ICD-10-CM

## 2013-06-01 MED ORDER — SODIUM CHLORIDE 0.9 % IV SOLN: 500.0000 mL | INTRAVENOUS | Status: DC

## 2013-06-01 NOTE — Progress Notes (Signed)
Called to room to assist during endoscopic procedure.  Patient ID and intended procedure confirmed with present staff. Received instructions for my participation in the procedure from the performing physician.  

## 2013-06-01 NOTE — Progress Notes (Addendum)
Patient did not experience any of the following events: a burn prior to discharge; a fall within the facility; wrong site/side/patient/procedure/implant event; or a hospital transfer or hospital admission upon discharge from the facility. ([id number])Patient did not have preoperative order for IV antibiotic SSI prophylaxis. [phone])  Pt bp was gradually increasing during recovery, held pt a little longer to check bp, last bp was 138/91 which had decreased from the previous.-adm

## 2013-06-01 NOTE — Op Note (Signed)
[facility] [address] [place], [address]   ENDOSCOPY PROCEDURE REPORT  PATIENT: Yolanda Jimenez, Yolanda Jimenez  MR#: [id number] BIRTHDATE: 22-Jun-1946 , 67  yrs. old GENDER: Female ENDOSCOPIST: Hart Carwin, MD REFERRED BY:  Bethann Punches, M.D. PROCEDURE DATE:  06/01/2013 PROCEDURE:  EGD w/ biopsy ASA CLASS:     Class II INDICATIONS:  history of Barrett's esophagus.   05/2011 EGD- Barrett's esophagus, pt is a smoker, also hx of diarrhea. MEDICATIONS: MAC sedation, administered by CRNA and propofol (Diprivan) 100mg  IV TOPICAL ANESTHETIC: none  DESCRIPTION OF PROCEDURE: After the risks benefits and alternatives of the procedure were thoroughly explained, informed consent was obtained.  The LB [id number] endoscope was introduced through the mouth and advanced to the second portion of the duodenum. Without limitations.  The instrument was slowly withdrawn as the mucosa was fully examined.      [Esophagus: proximal mid and distal esophageal mucosa appeared normal there was no esophageal stricture or hernia. The Z line appeared normal. Biopsies were taken for followup of Barrett's esophagus Stomach: Gastric mucosa appeared normal through the body and the gastric antrum. Pyloric outlet was unremarkable. Retroflexion of the scope revealed normal fundus and cardia. Duodenum, descending duodenum and duodenal bulb were unremarkable. Multiple biopsies were taken from second portion duodenum to rule out sprue          The scope was then withdrawn from the patient and the procedure completed.  COMPLICATIONS: There were no complications. ENDOSCOPIC IMPRESSION:  normal upper endoscopy L. esophagus stomach and duodenum. Status post biopsies from the GE junction to followup on Barrett's esophagus. Status post small bowel biopsy to evaluate chronic diarrhea RECOMMENDATIONS: 1.  Await biopsy results 2.  Anti-reflux regimen to be follow 3.  Continue PPI  REPEAT EXAM: for  EGD pending biopsy results.  eSigned:  Hart Carwin, MD 06/01/2013 9:36 AM   CC:  PATIENT NAME:  Yolanda Jimenez, Yolanda Jimenez MR#: [id number]

## 2013-06-01 NOTE — Progress Notes (Signed)
Pt states she has had microscopic blood in her urine "for years:- has been followed be her MD

## 2013-06-01 NOTE — Patient Instructions (Addendum)

## 2013-06-01 NOTE — Progress Notes (Signed)
Report to pacu rn, vss, bbs=clear 

## 2013-06-04 ENCOUNTER — Telehealth: Payer: Self-pay | Admitting: *Deleted

## 2013-06-04 NOTE — Telephone Encounter (Signed)
  Follow up Call-  Call back number 06/01/2013 06/01/2011  Post procedure Call Back phone  # [phone]  Permission to leave phone message Yes -     Patient questions:  Do you have a fever, pain , or abdominal swelling? no Pain Score  0 *  Have you tolerated food without any problems? yes  Have you been able to return to your normal activities? yes  Do you have any questions about your discharge instructions: Diet   no Medications  no Follow up visit  no  Do you have questions or concerns about your Care? no  Actions: * If pain score is 4 or above: No action needed, pain <4.

## 2013-06-05 ENCOUNTER — Encounter: Payer: Self-pay | Admitting: Internal Medicine

## 2013-06-07 ENCOUNTER — Encounter: Payer: Self-pay | Admitting: Internal Medicine

## 2013-06-07 LAB — 5 HIAA, QUANTITATIVE, URINE, 24 HOUR: 5-HIAA, 24 Hr Urine: 7.4 mg/24 h — ABNORMAL HIGH (ref <=6.0)

## 2013-06-14 ENCOUNTER — Other Ambulatory Visit: Payer: Self-pay

## 2013-06-15 ENCOUNTER — Telehealth: Payer: Self-pay | Admitting: Internal Medicine

## 2013-06-15 NOTE — Telephone Encounter (Signed)
Patient is calling for results of 24 hour urine test. She has results that were posted on MyChart but does not know what they mean.

## 2013-06-17 NOTE — Telephone Encounter (Signed)
24 hour urine collection for HIAA shows very slightly elevated value of 7.4, in order to see if it is significant, we will have to repeat the test in 3 months. As it is now, it is not a significant elevation to warranty octreotide scan.She will need a follow up appointment for diarrhea.

## 2013-06-18 NOTE — Telephone Encounter (Signed)
Patient notified of Dr. Regino Schultze response.  She is scheduled for a REV for 07/03/13 10:00

## 2013-07-03 ENCOUNTER — Encounter: Payer: Self-pay | Admitting: Internal Medicine

## 2013-07-03 ENCOUNTER — Ambulatory Visit (INDEPENDENT_AMBULATORY_CARE_PROVIDER_SITE_OTHER): Payer: Medicare Other | Admitting: Internal Medicine

## 2013-07-03 VITALS — BP 120/80 | HR 78 | Ht 61.0 in | Wt 125.4 lb

## 2013-07-03 DIAGNOSIS — R197 Diarrhea, unspecified: Secondary | ICD-10-CM

## 2013-07-03 MED ORDER — OMEPRAZOLE 20 MG PO CPDR: DELAYED_RELEASE_CAPSULE | ORAL | Status: DC

## 2013-07-03 NOTE — Patient Instructions (Addendum)
You have been scheduled for a small bowel follow thru at [facility] Radiology Department. Your appointment is on 07/04/13 at 9:00 am. Please arrive 30 minutes prior to your test for registration. Make certain not to have anything to eat or drink starting Midnight on the night before your test. ____________________________________________________________________ The Small Bowel Follow Thru examination is used to visualize the entire small bowel (intestines); specifically the connection between the small and large intestine. You will be positioned on a flat x-ray table and an image of your abdomen taken. Then the technologist will show the x-ray to the radiologist. The radiologist will instruct your technologist how much (1-2 cups) barium sulfate you will drink and when to begin taking the timed x-rays, usually 15-30 minutes after you begin drinking. Barium is a harmless substance that will highlight your small intestine by absorbing x-ray. The taste is chalky and it feels very heavy both in the cup and in your stomach.  After the first x-ray is taken and shown to the radiologist, he/she will determine when the next image is to be taken. This is repeated until the barium has reached the end of the small intestine and enters the beginning of the colon (cecum). At such time when the barium spills into the colon, you will be positioned on the x-ray table once again. The radiologist will use a fluoroscopic camera to take some detailed pictures of the connection between your small intestine and colon. The fluoroscope is an x-ray unit that works with a television/computer screen. The radiologist will apply pressure to your abdomen with his/her hand and a lead glove, a plastic paddle, or a paddle with an inflated rubber balloon on the end. This is to spread apart your loops of intestine so he/she can see all areas.   This test typically takes around 1 hour to complete.  **Important** Drink plenty of  water (8-10 cups/day) for a few days following the procedure to avoid constipation and blockage. The barium will make your stools white for a few days. ____________________________________________________________________  We have sent the following medications to your pharmacy for you to pick up at your convenience: Omeprazole  CC: Dr Hyacinth Meeker

## 2013-07-03 NOTE — Progress Notes (Signed)
Yolanda Jimenez [birth year] MRN [id number]  History of Present Illness:  This is a 67 year old white female with AV malformation, chronic GI blood loss and chronic diarrhea. AVMs were diagnosed on a small bowel capsule endoscopy.doen in [place]. She had Barrett's esophagus on an endoscopy in 2012 but the most recent endoscopy last month did not show any evidence of Barrett's esophagus. Her diarrhea has been fully evaluated with small bowel biopsies which were negative. She also had a negative sprue profile and had a slightly elevated 24-hour urine for HIAA of 7.4 (normal being less than 6). She has a history of C. difficile colitis treated with Flagyl in September 2012. Her diarrhea is controlled with Imodium, one a day. If she doesn't take it, she has loose stools. She denies abdominal pain or weight loss. Her last colonoscopy in 2011 showed adenomatous polyps, random biopsies negative for microscopic colitis. Her recall colonoscopy will be due in January 2016.    Past Medical History  Diagnosis Date  . IBS (irritable bowel syndrome)   . Hyperlipidemia   . Diverticulosis   . Hx of adenomatous colonic polyps   . Barrett esophagus   . AVM (arteriovenous malformation)   . Iron deficiency anemia   . Microscopic hematuria   . PVD (peripheral vascular disease)   . Carotid stenosis   . Osteoporosis     mild  . Hypertension   . Insomnia   . C. difficile diarrhea   . GERD (gastroesophageal reflux disease)   . Kidney cysts    Past Surgical History  Procedure Laterality Date  . Cholecystectomy    . Cervical cone biopsy    . Bunionectomy      right foot  . Foot surgery      right 2nd toe  . Tubal ligation      reports that she has been smoking Cigarettes.  She has been smoking about 0.50 packs per day. She has never used smokeless tobacco. She reports that she drinks alcohol. She reports that she does not use illicit drugs. family history includes Diabetes in her maternal grandmother,  mother, sister, and son; Heart disease in her mother; Hypertension in her mother and son; Inflammatory bowel disease in her father; Multiple myeloma in her father. There is no history of Colon cancer, Esophageal cancer, Rectal cancer, or Stomach cancer. Allergies  Allergen Reactions  . Codeine         Review of Systems: Negative for weight loss or abdominal pain  The remainder of the 10 point ROS is negative except as outlined in H&P   Physical Exam: General appearance  Well developed, in no distress. Psychological normal mood and affect.  Assessment and Plan:  Problem #73 67 year old white female with chronic diarrhea who has slight elevation of 24-hour urine collection for HIAA's. The elevation is only slight and probably not clinically significant. However, I would like to repeat the 24-hour urine collection in 6 months. We will obtain a small bowel follow-through . She will continue Imodium, one a day.Consider Lotronex 0.5 mg  qd  Problem #2 History of adenomatous polyps. A repeat colonoscopy will be due in January 2016.  Problem #3 AVMs causing chronic GI blood loss. Her last hemoglobin was 13.3, iron saturation was 26%. She will continue iron supplements 2 times a week.   07/03/2013 [name]

## 2013-07-04 ENCOUNTER — Ambulatory Visit: Payer: Self-pay | Admitting: Internal Medicine

## 2013-07-18 ENCOUNTER — Telehealth: Payer: Self-pay | Admitting: Internal Medicine

## 2013-07-18 MED ORDER — COLESTIPOL HCL 1 G PO TABS: ORAL_TABLET | ORAL | Status: DC

## 2013-07-18 NOTE — Telephone Encounter (Signed)
Spoke with patient and gave her Dr. Brodie's recommendations. 

## 2013-07-18 NOTE — Telephone Encounter (Signed)
SBFT transit time was 60 min which is not too fast or too slow, about normal. We are planning to repeat the urine collection for HIAA's in  About  April 2015. Try Colestid 1gm, #60 2 po qd, it binds bile overflow after cholecystectomy and often helps the diarrhea. She can still use the Imodium

## 2013-07-18 NOTE — Telephone Encounter (Signed)
rx sent to pharmacy. Left a message for patient to call me. 

## 2013-07-18 NOTE — Telephone Encounter (Signed)
Spoke with patient and she is asking for SBFT results. The results as per report from [facility] are negative. She is asking what is next for her. Please, advise.

## 2013-08-03 ENCOUNTER — Encounter: Payer: Self-pay | Admitting: Internal Medicine

## 2013-08-09 DIAGNOSIS — R87629 Unspecified abnormal cytological findings in specimens from vagina: Secondary | ICD-10-CM

## 2013-08-09 DIAGNOSIS — Z9841 Cataract extraction status, right eye: Secondary | ICD-10-CM

## 2013-08-09 HISTORY — DX: Unspecified abnormal cytological findings in specimens from vagina: R87.629

## 2013-08-09 HISTORY — DX: Cataract extraction status, left eye: Z98.41

## 2013-09-01 ENCOUNTER — Other Ambulatory Visit: Payer: Self-pay | Admitting: Internal Medicine

## 2013-09-11 ENCOUNTER — Ambulatory Visit: Payer: Self-pay | Admitting: Ophthalmology

## 2013-10-09 ENCOUNTER — Ambulatory Visit: Payer: Self-pay | Admitting: Internal Medicine

## 2013-10-16 ENCOUNTER — Ambulatory Visit: Payer: Self-pay | Admitting: Ophthalmology

## 2013-11-06 ENCOUNTER — Telehealth: Payer: Self-pay | Admitting: Internal Medicine

## 2013-11-06 DIAGNOSIS — R197 Diarrhea, unspecified: Secondary | ICD-10-CM

## 2013-11-06 NOTE — Telephone Encounter (Signed)
Patient states she is to have a repeat 24 hour urine in April 2015. Please, advise if she needs this.

## 2013-11-06 NOTE — Telephone Encounter (Signed)
Patient notified. Lab in EPIC.

## 2013-11-06 NOTE — Telephone Encounter (Signed)
24 hr urine for HIAA 06/01/2013 was slightly elevated, repeat in 6 months 11/30/2013. Please notify the pt.

## 2013-11-15 ENCOUNTER — Other Ambulatory Visit: Payer: Medicare Other

## 2013-11-15 DIAGNOSIS — R197 Diarrhea, unspecified: Secondary | ICD-10-CM

## 2013-11-22 LAB — 5 HIAA, QUANTITATIVE, URINE, 24 HOUR: 5-HIAA, 24 Hr Urine: 3.3 mg/24 h (ref <=6.0)

## 2013-12-10 ENCOUNTER — Other Ambulatory Visit: Payer: Self-pay | Admitting: Internal Medicine

## 2014-01-29 ENCOUNTER — Ambulatory Visit: Payer: Self-pay | Admitting: Physician Assistant

## 2014-03-01 DIAGNOSIS — N390 Urinary tract infection, site not specified: Secondary | ICD-10-CM | POA: Insufficient documentation

## 2014-03-01 DIAGNOSIS — R3129 Other microscopic hematuria: Secondary | ICD-10-CM | POA: Insufficient documentation

## 2014-03-01 DIAGNOSIS — R351 Nocturia: Secondary | ICD-10-CM | POA: Insufficient documentation

## 2014-03-02 DIAGNOSIS — R1030 Lower abdominal pain, unspecified: Secondary | ICD-10-CM | POA: Insufficient documentation

## 2014-03-19 DIAGNOSIS — R350 Frequency of micturition: Secondary | ICD-10-CM | POA: Insufficient documentation

## 2014-03-21 ENCOUNTER — Telehealth: Payer: Self-pay | Admitting: Internal Medicine

## 2014-03-21 MED ORDER — DICYCLOMINE HCL 10 MG PO CAPS: ORAL_CAPSULE | ORAL | Status: DC

## 2014-03-21 NOTE — Telephone Encounter (Signed)
Patient calling to report for the last 2-3 weeks, she has had episodes of lower abdominal pain and "man belching." Almost went to ED. She took Tums, Zantac and Maalox. Maalox finally helped. Scheduled with Dr. Olevia Perches on 04/02/14 at 1:30 PM.

## 2014-03-21 NOTE — Telephone Encounter (Signed)
Spoke with patient and gave her Dr. Brodie's recommendations. 

## 2014-03-21 NOTE — Telephone Encounter (Signed)
Please continue Omeprazole 20 mg daily as listed under her meds ( just in case she stopped it). Bentyl 10 mg, #30, 1 po tid ac, prn lower abd. pain

## 2014-04-02 ENCOUNTER — Ambulatory Visit (INDEPENDENT_AMBULATORY_CARE_PROVIDER_SITE_OTHER): Payer: Medicare Other | Admitting: Internal Medicine

## 2014-04-02 ENCOUNTER — Encounter: Payer: Self-pay | Admitting: Internal Medicine

## 2014-04-02 VITALS — BP 106/70 | HR 60 | Ht 61.0 in | Wt 133.2 lb

## 2014-04-02 DIAGNOSIS — T8189XA Other complications of procedures, not elsewhere classified, initial encounter: Secondary | ICD-10-CM

## 2014-04-02 DIAGNOSIS — D509 Iron deficiency anemia, unspecified: Secondary | ICD-10-CM

## 2014-04-02 DIAGNOSIS — K5520 Angiodysplasia of colon without hemorrhage: Secondary | ICD-10-CM

## 2014-04-02 DIAGNOSIS — K2270 Barrett's esophagus without dysplasia: Secondary | ICD-10-CM

## 2014-04-02 MED ORDER — DICYCLOMINE HCL 10 MG PO CAPS: 10.0000 mg | ORAL_CAPSULE | ORAL | Status: DC

## 2014-04-02 MED ORDER — COLESTIPOL HCL 1 G PO TABS: ORAL_TABLET | ORAL | Status: DC

## 2014-04-02 NOTE — Patient Instructions (Addendum)
You will be due for a recall colonoscopy in 08/2014. We will send you a reminder in the mail when it gets closer to that time.  We have sent the following medications to your pharmacy for you to pick up at your convenience: Dicyclomine Colestipol  CC:Dr Emily Filbert

## 2014-04-02 NOTE — Progress Notes (Signed)
Yolanda Jimenez 05-Aug-1946 [id number]  Note: This dictation was prepared with Dragon digital system. Any transcriptional errors that result from this procedure are unintentional.   History of Present Illness:  This is a 68 year old white female with postcholecystectomy diarrhea  controlled on colestipol 2 g daily, Imodium 1 by mouth every other day, Bentyl 10 mg every morning. Her last appointment was in November 2014. She will be due for a recall colonoscopy in January 2016. There is a history of adenomatous polyps in 08/2009.. Random biopsies of the colon were negative for microscopic colitis. She had a Barrett's esophagus om EGD  in 2012 but this was not confirmed on a repeat endoscopy in October 2014. She had mild elevation of 24-hour urine collection for HIAA. A repeat exam showed a normal value of 3.3. 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    Past Medical History  Diagnosis Date  . IBS (irritable bowel syndrome)   . Hyperlipidemia   . Diverticulosis   . Hx of adenomatous colonic polyps   . Barrett esophagus   . AVM (arteriovenous malformation)   . Iron deficiency anemia   . Microscopic hematuria   . PVD (peripheral vascular disease)   . Carotid stenosis   . Osteoporosis     mild  . Hypertension   . Insomnia   . C. difficile diarrhea   . GERD (gastroesophageal reflux disease)   . Kidney cysts     Past Surgical History  Procedure Laterality Date  . Cholecystectomy    . Cervical cone biopsy    . Bunionectomy      right foot  . Foot surgery      right 2nd toe  . Tubal ligation      Allergies  Allergen Reactions  . Codeine     Family history and social history have been reviewed.  Review of Systems: Denies dysphagia heartburn. Diarrhea has improved  The remainder of the 10 point ROS is negative except as outlined in the H&P  Physical Exam: General Appearance Well developed, in no distress Eyes   Non icteric  HEENT  Non traumatic, normocephalic  Mouth No lesion, tongue papillated, no cheilosis Neck Supple without adenopathy, thyroid not enlarged, no carotid bruits, no JVD Lungs Clear to auscultation bilaterally COR Normal S1, normal S2, regular rhythm, no murmur, quiet precordium Abdomen soft nontender with normoactive bowel sounds. 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 This is currently not a problem.  Problem #2 History of adenomatous polyps of the colon. She will be due for recall colonoscopy in January 2016.  Problem #3 Chronic diarrhea. This is probably from a combination of irritable bowel syndrome and bile salt overflow. We will refill Bentyl, colestipol and Imodium.  Problem #4 She will have her CBC and iron levels checked with her primary care physician, Dr. Sabra Heck.  Problem#5- icisional hernia post chole scar  Yolanda Jimenez 04/02/2014

## 2014-05-07 ENCOUNTER — Other Ambulatory Visit: Payer: Self-pay | Admitting: *Deleted

## 2014-05-07 ENCOUNTER — Telehealth: Payer: Self-pay | Admitting: Internal Medicine

## 2014-05-07 NOTE — Telephone Encounter (Signed)
Spoke with patient and gave her recommendations. Scheduled colonoscopy on 07/10/14 at 11:30 AM and pre visit on 07/03/14 at 10:00 pre visit.

## 2014-05-07 NOTE — Telephone Encounter (Signed)
Pt's complaint has been diarrhea, so we are treating her diarrhea with colestipol, Bentyl ,Imodium,  Clearly, it has been working. She needs to GRADUALLY reduce her medications to find the right balance. She has to do it herself..Taking a laxative is not a good idea. Since her underlying problem is post cholecystectomy diarrhea.You can go ahead and schedule her for colonoscopy.

## 2014-05-07 NOTE — Telephone Encounter (Signed)
Patient calling to report she has been having constipation and abdominal pain. States she stopped the Bentyl last week because she thought it was causing the constipation. She reports she has to take Dulcolax to have a bowel movement. She did hold her Colestipol yesterday. Last night, she had the abdominal pain. This AM she had diarrhea again. She is asking if she can schedule her colonoscopy now instead of January since she is having all these issues. Please, advise.

## 2014-05-21 ENCOUNTER — Encounter: Payer: Self-pay | Admitting: Internal Medicine

## 2014-05-31 ENCOUNTER — Telehealth: Payer: Self-pay | Admitting: *Deleted

## 2014-05-31 NOTE — Telephone Encounter (Signed)
Patient's omeprazole has been approved from 04/29/14-05/30/15. Case ID is [id number].

## 2014-07-03 ENCOUNTER — Ambulatory Visit (AMBULATORY_SURGERY_CENTER): Payer: Self-pay | Admitting: *Deleted

## 2014-07-03 VITALS — Ht 62.0 in | Wt 130.6 lb

## 2014-07-03 DIAGNOSIS — Z86010 Personal history of colonic polyps: Secondary | ICD-10-CM

## 2014-07-03 MED ORDER — MOVIPREP 100 G PO SOLR: ORAL | Status: DC

## 2014-07-03 NOTE — Progress Notes (Signed)
No egg or soy allergy  No anesthesia or intubation problems per pt  No diet medications taken  Registered in EMMI   

## 2014-07-10 ENCOUNTER — Ambulatory Visit (AMBULATORY_SURGERY_CENTER): Payer: Medicare Other | Admitting: Internal Medicine

## 2014-07-10 ENCOUNTER — Encounter: Payer: Self-pay | Admitting: Internal Medicine

## 2014-07-10 VITALS — BP 142/72 | HR 63 | Temp 97.8°F | Resp 20 | Ht 62.0 in | Wt 130.0 lb

## 2014-07-10 DIAGNOSIS — D126 Benign neoplasm of colon, unspecified: Secondary | ICD-10-CM

## 2014-07-10 DIAGNOSIS — D124 Benign neoplasm of descending colon: Secondary | ICD-10-CM

## 2014-07-10 DIAGNOSIS — Z86010 Personal history of colonic polyps: Secondary | ICD-10-CM

## 2014-07-10 MED ORDER — SODIUM CHLORIDE 0.9 % IV SOLN: 500.0000 mL | INTRAVENOUS | Status: DC

## 2014-07-10 NOTE — Patient Instructions (Addendum)
No Aspirin,Aspirin Products or NSAIDS (motrin, aleve, advil etc) for 2 weeks, July 24, 2014.  YOU HAD AN ENDOSCOPIC PROCEDURE TODAY AT [facility]: Refer to the procedure report that was given to you for any specific questions about what was found during the examination.  If the procedure report does not answer your questions, please call your gastroenterologist to clarify.  If you requested that your care partner not be given the details of your procedure findings, then the procedure report has been included in a sealed envelope for you to review at your convenience later.  YOU SHOULD EXPECT: Some feelings of bloating in the abdomen. Passage of more gas than usual.  Walking can help get rid of the air that was put into your GI tract during the procedure and reduce the bloating. If you had a lower endoscopy (such as a colonoscopy or flexible sigmoidoscopy) you may notice spotting of blood in your stool or on the toilet paper. If you underwent a bowel prep for your procedure, then you may not have a normal bowel movement for a few days.  DIET: Your first meal following the procedure should be a light meal and then it is ok to progress to your normal diet.  A half-sandwich or bowl of soup is an example of a good first meal.  Heavy or fried foods are harder to digest and may make you feel nauseous or bloated.  Likewise meals heavy in dairy and vegetables can cause extra gas to form and this can also increase the bloating.  Drink plenty of fluids but you should avoid alcoholic beverages for 24 hours.  ACTIVITY: Your care partner should take you home directly after the procedure.  You should plan to take it easy, moving slowly for the rest of the day.  You can resume normal activity the day after the procedure however you should NOT DRIVE or use heavy machinery for 24 hours (because of the sedation medicines used during the test).    SYMPTOMS TO REPORT IMMEDIATELY: A gastroenterologist  can be reached at any hour.  During normal business hours, 8:30 AM to 5:00 PM Monday through Friday, call [phone].  After hours and on weekends, please call the GI answering service at [phone] who will take a message and have the physician on call contact you.   Following lower endoscopy (colonoscopy or flexible sigmoidoscopy):  Excessive amounts of blood in the stool  Significant tenderness or worsening of abdominal pains  Swelling of the abdomen that is new, acute  Fever of 100F or higher  FOLLOW UP: If any biopsies were taken you will be contacted by phone or by letter within the next 1-3 weeks.  Call your gastroenterologist if you have not heard about the biopsies in 3 weeks.  Our staff will call the home number listed on your records the next business day following your procedure to check on you and address any questions or concerns that you may have at that time regarding the information given to you following your procedure. This is a courtesy call and so if there is no answer at the home number and we have not heard from you through the emergency physician on call, we will assume that you have returned to your regular daily activities without incident.  SIGNATURES/CONFIDENTIALITY: You and/or your care partner have signed paperwork which will be entered into your electronic medical record.  These signatures attest to the fact that that the information above on your After  Visit Summary has been reviewed and is understood.  Full responsibility of the confidentiality of this discharge information lies with you and/or your care-partner. 

## 2014-07-10 NOTE — Progress Notes (Signed)
Pt stable to RR 

## 2014-07-10 NOTE — Progress Notes (Signed)
Called to room to assist during endoscopic procedure.  Patient ID and intended procedure confirmed with present staff. Received instructions for my participation in the procedure from the performing physician.  

## 2014-07-10 NOTE — Op Note (Signed)
[facility]  [address] [place], [id number]   COLONOSCOPY PROCEDURE REPORT  PATIENT: Yolanda Jimenez, Yolanda Jimenez  MR#: [id number] BIRTHDATE: August 04, 1946 , 43  yrs. old GENDER: female ENDOSCOPIST: Lafayette Dragon, MD REFERRED HQ:[facility] Sabra Heck, M.D. PROCEDURE DATE:  07/10/2014 PROCEDURE:   Colonoscopy with snare polypectomy First Screening Colonoscopy - Avg.  risk and is 50 yrs.  old or older - No.  Prior Negative Screening - Now for repeat screening. N/A  History of Adenoma - Now for follow-up colonoscopy & has been > or = to 3 yrs.  Yes hx of adenoma.  Has been 3 or more years since last colonoscopy.  Polyps Removed Today? Yes. ASA CLASS:   Class II INDICATIONS:prior colonoscopy in 2007 and in 2011 revealed tubular adenoma. MEDICATIONS: Monitored anesthesia care and Propofol 150 mg IV  DESCRIPTION OF PROCEDURE:   After the risks benefits and alternatives of the procedure were thoroughly explained, informed consent was obtained.  The digital rectal exam revealed no abnormalities of the rectum.   The LB [id number]  endoscope was introduced through the anus and advanced to the cecum, which was identified by both the appendix and ileocecal valve. No adverse events experienced.   The quality of the prep was Moviprep fair The instrument was then slowly withdrawn as the colon was fully examined.      COLON FINDINGS: Two sessile polyps measuring 10 mm in size were found in the descending colon.  A polypectomy was performed with a cold snare.  The resection was complete, the polyp tissue was completely retrieved and sent to histology.   There was moderate diverticulosis noted in the sigmoid colon.  Retroflexed views revealed no abnormalities. The time to cecum=7 minutes 44 seconds. Withdrawal time=6 minutes 25 seconds.  The scope was withdrawn and the procedure completed. COMPLICATIONS: There were no immediate complications.  ENDOSCOPIC IMPRESSION: 1.   Two sessile  polyps were found in the descending colon; polypectomy was performed with a cold snare 2.   Moderate diverticulosis was noted in the sigmoid colon 3 .internal hemorrhoids first-grade   RECOMMENDATIONS: 1.  Await pathology results 2.  No aspirin or anti-inflammatory agents for 2 weeks  3. High fiber diet recall colonoscopy pending path report  eSigned:  Lafayette Dragon, MD 07/10/2014 12:42 PM   cc:   PATIENT NAME:  Yolanda Jimenez, Yolanda Jimenez MR#: [id number]

## 2014-07-11 ENCOUNTER — Telehealth: Payer: Self-pay | Admitting: *Deleted

## 2014-07-11 NOTE — Telephone Encounter (Signed)
  Follow up Call-  Call back number 07/10/2014 06/01/2013  Post procedure Call Back phone  # [phone]  Permission to leave phone message Yes Yes     Patient questions:  Do you have a fever, pain , or abdominal swelling? No. Pain Score  0 *  Have you tolerated food without any problems? Yes.    Have you been able to return to your normal activities? No.  Do you have any questions about your discharge instructions: Diet   No. Medications  No. Follow up visit  No.  Do you have questions or concerns about your Care? No.  Actions: * If pain score is 4 or above: No action needed, pain <4.

## 2014-07-12 ENCOUNTER — Emergency Department: Payer: Self-pay | Admitting: Emergency Medicine

## 2014-07-12 LAB — URINALYSIS, COMPLETE
Bacteria: NONE SEEN
Bilirubin,UR: NEGATIVE
Glucose,UR: NEGATIVE mg/dL (ref 0–75)
Ketone: NEGATIVE
Nitrite: NEGATIVE
Ph: 5 (ref 4.5–8.0)
Protein: NEGATIVE
RBC,UR: 17 /HPF (ref 0–5)
Specific Gravity: 1.015 (ref 1.003–1.030)
Squamous Epithelial: 1
Transitional Epi: <1
WBC UR: 2 /HPF (ref 0–5)

## 2014-07-12 LAB — CBC
HCT: 36.8 % (ref 35.0–47.0)
HGB: 11.6 g/dL — ABNORMAL LOW (ref 12.0–16.0)
MCH: 27.4 pg (ref 26.0–34.0)
MCHC: 31.6 g/dL — ABNORMAL LOW (ref 32.0–36.0)
MCV: 87 fL (ref 80–100)
Platelet: 414 10*3/uL (ref 150–440)
RBC: 4.24 10*6/uL (ref 3.80–5.20)
RDW: 18.1 % — ABNORMAL HIGH (ref 11.5–14.5)
WBC: 8.2 10*3/uL (ref 3.6–11.0)

## 2014-07-12 LAB — BASIC METABOLIC PANEL
Anion Gap: 5 — ABNORMAL LOW (ref 7–16)
BUN: 11 mg/dL (ref 7–18)
Calcium, Total: 8.3 mg/dL — ABNORMAL LOW (ref 8.5–10.1)
Chloride: 107 mmol/L (ref 98–107)
Co2: 30 mmol/L (ref 21–32)
Creatinine: 0.73 mg/dL (ref 0.60–1.30)
EGFR (African American): >60
EGFR (Non-African Amer.): >60
Glucose: 133 mg/dL — ABNORMAL HIGH (ref 65–99)
Osmolality: 284 (ref 275–301)
Potassium: 3.4 mmol/L — ABNORMAL LOW (ref 3.5–5.1)
Sodium: 142 mmol/L (ref 136–145)

## 2014-07-12 LAB — TROPONIN I: Troponin-I: <0.02 ng/mL

## 2014-07-15 ENCOUNTER — Encounter: Payer: Self-pay | Admitting: Internal Medicine

## 2014-07-24 DIAGNOSIS — R339 Retention of urine, unspecified: Secondary | ICD-10-CM | POA: Insufficient documentation

## 2014-08-29 DIAGNOSIS — E119 Type 2 diabetes mellitus without complications: Secondary | ICD-10-CM | POA: Insufficient documentation

## 2014-08-29 DIAGNOSIS — E782 Mixed hyperlipidemia: Secondary | ICD-10-CM | POA: Insufficient documentation

## 2014-08-29 DIAGNOSIS — E1151 Type 2 diabetes mellitus with diabetic peripheral angiopathy without gangrene: Secondary | ICD-10-CM | POA: Insufficient documentation

## 2014-08-29 DIAGNOSIS — N029 Recurrent and persistent hematuria with unspecified morphologic changes: Secondary | ICD-10-CM | POA: Insufficient documentation

## 2014-08-29 HISTORY — DX: Recurrent and persistent hematuria with unspecified morphologic changes: N02.9

## 2014-10-14 ENCOUNTER — Ambulatory Visit: Payer: Self-pay | Admitting: Internal Medicine

## 2015-04-20 ENCOUNTER — Emergency Department
Admission: EM | Admit: 2015-04-20 | Discharge: 2015-04-20 | Disposition: A | Discharge status: Home / Self Care | Payer: Medicare Other | Attending: Emergency Medicine | Admitting: Emergency Medicine

## 2015-04-20 ENCOUNTER — Encounter: Payer: Self-pay | Admitting: Emergency Medicine

## 2015-04-20 ENCOUNTER — Emergency Department: Payer: Medicare Other

## 2015-04-20 DIAGNOSIS — Z79811 Long term (current) use of aromatase inhibitors: Secondary | ICD-10-CM | POA: Insufficient documentation

## 2015-04-20 DIAGNOSIS — G8929 Other chronic pain: Secondary | ICD-10-CM | POA: Diagnosis not present

## 2015-04-20 DIAGNOSIS — K439 Ventral hernia without obstruction or gangrene: Secondary | ICD-10-CM

## 2015-04-20 DIAGNOSIS — Z79899 Other long term (current) drug therapy: Secondary | ICD-10-CM | POA: Insufficient documentation

## 2015-04-20 DIAGNOSIS — E119 Type 2 diabetes mellitus without complications: Secondary | ICD-10-CM | POA: Insufficient documentation

## 2015-04-20 DIAGNOSIS — Z720 Tobacco use: Secondary | ICD-10-CM | POA: Insufficient documentation

## 2015-04-20 DIAGNOSIS — R1010 Upper abdominal pain, unspecified: Secondary | ICD-10-CM | POA: Diagnosis present

## 2015-04-20 HISTORY — DX: Type 2 diabetes mellitus without complications: E11.9

## 2015-04-20 LAB — COMPREHENSIVE METABOLIC PANEL
ALT: 11 U/L — ABNORMAL LOW (ref 14–54)
AST: 22 U/L (ref 15–41)
Albumin: 4.1 g/dL (ref 3.5–5.0)
Alkaline Phosphatase: 67 U/L (ref 38–126)
Anion gap: 8 (ref 5–15)
BUN: 15 mg/dL (ref 6–20)
CO2: 26 mmol/L (ref 22–32)
Calcium: 8.8 mg/dL — ABNORMAL LOW (ref 8.9–10.3)
Chloride: 102 mmol/L (ref 101–111)
Creatinine, Ser: 0.65 mg/dL (ref 0.44–1.00)
GFR calc Af Amer: >60 mL/min (ref >60)
GFR calc non Af Amer: >60 mL/min (ref >60)
Glucose, Bld: 118 mg/dL — ABNORMAL HIGH (ref 65–99)
Potassium: 3.7 mmol/L (ref 3.5–5.1)
Sodium: 136 mmol/L (ref 135–145)
Total Bilirubin: 0.6 mg/dL (ref 0.3–1.2)
Total Protein: 7.2 g/dL (ref 6.5–8.1)

## 2015-04-20 LAB — URINALYSIS COMPLETE WITH MICROSCOPIC (ARMC ONLY)
Bilirubin Urine: NEGATIVE
Glucose, UA: NEGATIVE mg/dL
Ketones, ur: NEGATIVE mg/dL
Nitrite: NEGATIVE
Protein, ur: NEGATIVE mg/dL
Specific Gravity, Urine: 1.011 (ref 1.005–1.030)
pH: 5 (ref 5.0–8.0)

## 2015-04-20 LAB — CBC
HCT: 41.7 % (ref 35.0–47.0)
Hemoglobin: 13.7 g/dL (ref 12.0–16.0)
MCH: 30.3 pg (ref 26.0–34.0)
MCHC: 33 g/dL (ref 32.0–36.0)
MCV: 91.9 fL (ref 80.0–100.0)
Platelets: 346 10*3/uL (ref 150–440)
RBC: 4.53 MIL/uL (ref 3.80–5.20)
RDW: 15.3 % — ABNORMAL HIGH (ref 11.5–14.5)
WBC: 9.5 10*3/uL (ref 3.6–11.0)

## 2015-04-20 LAB — TROPONIN I: Troponin I: <0.03 ng/mL (ref <0.031)

## 2015-04-20 LAB — LIPASE, BLOOD: Lipase: 28 U/L (ref 22–51)

## 2015-04-20 MED ORDER — ACETAMINOPHEN 500 MG PO TABS
1000.0000 mg | ORAL_TABLET | Freq: Once | ORAL | Status: AC
Start: 2015-04-20 — End: 2015-04-20
  Administered 2015-04-20: 1000 mg via ORAL
  Filled 2015-04-20: qty 2

## 2015-04-20 MED ORDER — IOHEXOL 300 MG/ML  SOLN
75.0000 mL | Freq: Once | INTRAMUSCULAR | Status: AC | PRN
  Administered 2015-04-20: 75 mL via INTRAVENOUS

## 2015-04-20 MED ORDER — SODIUM CHLORIDE 0.9 % IV BOLUS (SEPSIS)
500.0000 mL | Freq: Once | INTRAVENOUS | Status: AC
  Administered 2015-04-20: 500 mL via INTRAVENOUS

## 2015-04-20 MED ORDER — IOHEXOL 240 MG/ML SOLN
25.0000 mL | INTRAMUSCULAR | Status: AC
  Administered 2015-04-20: 25 mL via ORAL

## 2015-04-20 NOTE — ED Provider Notes (Signed)
[facility] Emergency Department Provider Note REMINDER - THIS NOTE IS NOT A FINAL MEDICAL RECORD UNTIL IT IS SIGNED. UNTIL THEN, THE CONTENT BELOW MAY REFLECT INFORMATION FROM A DOCUMENTATION TEMPLATE, NOT THE ACTUAL PATIENT VISIT. ____________________________________________  Time seen: Approximately 1:04 PM  I have reviewed the triage vital signs and the nursing notes.   HISTORY  Chief Complaint Abdominal Pain    HPI Yolanda Jimenez is a 69 y.o. female previous history of a cholecystectomy, diverticulosis, colonic AVM who presents today with upper abdominal pain. She reports that when she woke up at 7 AM this morning she was noticing moderate cramping pain in the area of her old surgical hernia. She also reports she felt this pain kind of radiated both up and down into the lower chest and into the lower abdomen. No nausea or vomiting. No trouble breathing. There is no fever or chills. She also reports having chronic low back pain, this does not appear to be significantly changed.  She reports that Dr. Iran Planas performed her original cholecystectomy many years ago.  The patient reports that her hernia actually seems to be slightly smaller than it usually does, and she is not completely sure that the pain is actually coming from it but the area of pain is the same region that it is in.   Past Medical History  Diagnosis Date  . IBS (irritable bowel syndrome)   . Hyperlipidemia   . Diverticulosis   . Hx of adenomatous colonic polyps   . Barrett esophagus   . AVM (arteriovenous malformation)   . Iron deficiency anemia   . Microscopic hematuria   . PVD (peripheral vascular disease)   . Carotid stenosis   . Osteoporosis     mild  . Insomnia   . C. difficile diarrhea   . GERD (gastroesophageal reflux disease)   . Heart murmur   . Kidney cysts     hx UTIs  . Diabetes mellitus without complication     Patient Active Problem List   Diagnosis Date  Noted  . Diarrhea 03/05/2013  . COLONIC POLYPS, ADENOMATOUS 09/05/2007  . HYPERLIPIDEMIA 09/05/2007  . IRON DEFICIENCY 09/05/2007  . BARRETTS ESOPHAGUS 09/05/2007  . DIVERTICULOSIS, COLON 09/05/2007  . IRRITABLE BOWEL SYNDROME 09/05/2007  . ARTERIOVENOUS MALFORMATION 09/05/2007    Past Surgical History  Procedure Laterality Date  . Cholecystectomy    . Cervical cone biopsy    . Bunionectomy      right foot  . Foot surgery      right 2nd toe  . Tubal ligation    . Cataract extraction      both eyes  . Colonoscopy    . Upper gastrointestinal endoscopy      Current Outpatient Rx  Name  Route  Sig  Dispense  Refill  . Ascorbic Acid (VITAMIN C) 1000 MG tablet   Oral   Take 1,000 mg by mouth daily.           . calcium carbonate 200 MG capsule   Oral   Take 250 mg by mouth daily.           . Cholecalciferol (VITAMIN D) 1000 UNITS capsule   Oral   Take 1,000 Units by mouth daily.           . Loperamide HCl (IMODIUM PO)   Oral   Take 1 tablet by mouth as needed.         [name] omeprazole (PRILOSEC) 20 MG capsule  Take 2 tablets by mouth every morning (30 minutes before breakfast) and 1 tablet by mouth at bedtime.   270 capsule   3   . Probiotic Product (PROBIOTIC DAILY PO)   Oral   Take by mouth. Perfect probiotic 1 tablet daily         . simvastatin (ZOCOR) 40 MG tablet   Oral   Take 40 mg by mouth at bedtime.           . temazepam (RESTORIL) 30 MG capsule   Oral   Take 30 mg by mouth at bedtime as needed.             Allergies Codeine  Family History  Problem Relation Age of Onset  . Colon cancer Neg Hx   . Esophageal cancer Neg Hx   . Rectal cancer Neg Hx   . Stomach cancer Neg Hx   . Diabetes Mother   . Heart disease Mother   . Hypertension Mother   . Diabetes Son   . Diabetes Sister   . Inflammatory bowel disease Father     diverticulitis  . Multiple myeloma Father   . Hypertension Son   . Diabetes Maternal Grandmother      Social History Social History  Substance Use Topics  . Smoking status: Current Every Day Smoker -- 0.50 packs/day    Types: Cigarettes  . Smokeless tobacco: Never Used     Comment: she has patches to start just not started yet.   . Alcohol Use: Yes     Comment: socially    Review of Systems Constitutional: No fever/chills Eyes: No visual changes. ENT: No sore throat. Cardiovascular: Denies chest pain. Respiratory: Denies shortness of breath. Gastrointestinal:  No nausea, no vomiting.  No diarrhea.  No constipation. She had a normal bowel movement this morning. Genitourinary: Negative for dysuria. Musculoskeletal: Negative for back pain except for her somewhat chronic pain. Skin: Negative for rash. Neurological: Negative for headaches, focal weakness or numbness.  10-point ROS otherwise negative.  ____________________________________________   PHYSICAL EXAM:  VITAL SIGNS: ED Triage Vitals  Enc Vitals Group     BP 04/20/15 1226 138/73 mmHg     Pulse Rate 04/20/15 1226 73     Resp 04/20/15 1226 20     Temp 04/20/15 1226 98 F (36.7 C)     Temp Source 04/20/15 1226 Oral     SpO2 04/20/15 1226 99 %     Weight 04/20/15 1226 117 lb (53.071 kg)     Height 04/20/15 1226 _0  (1.575 m)     Head Cir --      Peak Flow --      Pain Score 04/20/15 1228 8     Pain Loc --      Pain Edu? --      Excl. in [place]? --    Constitutional: Alert and oriented. Well appearing and in no acute distress. Eyes: Conjunctivae are normal. PERRL. EOMI. Head: Atraumatic. Nose: No congestion/rhinnorhea. Mouth/Throat: Mucous membranes are moist.  Oropharynx non-erythematous. Neck: No stridor.   Cardiovascular: Normal rate, regular rhythm. Grossly normal heart sounds.  Good peripheral circulation. Respiratory: Normal respiratory effort.  No retractions. Lungs CTAB. Gastrointestinal: Soft and nontender except for some very mild tenderness in the epigastrium, she does have a soft ventral hernia,  but there is no overlying erythema and it does not appear to be overtly tender. The hernia is not reducible, but is also not painful to attempt to reduce and she reports  that she has not been able to push this hernia back in for well over a year. She does have mild tenderness around the site of the umbilical hernia throughout the epigastrium without rebound or guarding. No distention. No abdominal bruits.Musculoskeletal: No lower extremity tenderness nor edema.  No joint effusions. Neurologic:  Normal speech and language. No gross focal neurologic deficits are appreciated. No gait instability. Skin:  Skin is warm, dry and intact. No rash noted. Psychiatric: Mood and affect are normal. Speech and behavior are normal.  ____________________________________________   LABS (all labs ordered are listed, but only abnormal results are displayed)  Labs Reviewed  COMPREHENSIVE METABOLIC PANEL - Abnormal; Notable for the following:    Glucose, Bld 118 (*)    Calcium 8.8 (*)    ALT 11 (*)    All other components within normal limits  CBC - Abnormal; Notable for the following:    RDW 15.3 (*)    All other components within normal limits  URINALYSIS COMPLETEWITH MICROSCOPIC ([facility] ONLY) - Abnormal; Notable for the following:    Color, Urine YELLOW (*)    APPearance CLEAR (*)    Hgb urine dipstick 1+ (*)    Leukocytes, UA TRACE (*)    Bacteria, UA RARE (*)    Squamous Epithelial / LPF 0-5 (*)    All other components within normal limits  LIPASE, BLOOD  TROPONIN I   ____________________________________________  EKG  Reviewed and interpreted by me Ventricular rate 55 Sinus bradycardia No ischemic T-wave abnormalities Overall reviewed and felt to be normal EKG except for sinus bradycardia without ischemic change QRS 66 QTc 400 PR 1:30 ____________________________________________  RADIOLOGY  IMPRESSION: No acute intra abdominal abnormality to account for the patient's symptoms.  Note  that there is a small ventral midline hernia containing small amount of fat and the falciform ligament just be low the xiphoid process. Unchanged in size since the comparison, however, there is a small amount of fluid which may a represent inflammatory changes. Correlation with point tenderness at this location would be useful.  No nephrolithiasis. Right kidney collecting system is unchanged from the comparison.  Signed,  Dulcy Fanny. Earleen Newport, DO  [facility]  [facility]iology ____________________________________________   PROCEDURES  Procedure(s) performed: None  Critical Care performed: No  ____________________________________________   INITIAL IMPRESSION / ASSESSMENT AND PLAN / ED COURSE  Pertinent labs & imaging results that were available during my care of the patient were reviewed by me and considered in my medical decision making (see chart for details).  CT and labs and history reviewed with Dr. Marletta Lor of surgery. He advises that close outpatient follow-up is indicated, but no surgical intervention at this time. I agree, the patient reports her symptoms are much better and she has no evidence of incarceration. She does appear to have a fat and ligament containing hernia without bowel involvement. I reviewed follow-up and a close plan of care and return precautions with the patient who is very agreeable. She is sitting up, feeling well, fully dressed and reports that she feels much better at this time after Tylenol. ____________________________________________   FINAL CLINICAL IMPRESSION(S) / ED DIAGNOSES  Final diagnoses:  Ventral hernia without obstruction or gangrene      Delman Kitten, MD 04/20/15 [phone]

## 2015-04-20 NOTE — ED Notes (Signed)
Patient has hernia to upper abdomen that occurred after gallbladder surgery a few years ago.  Patient began having severe abdominal pain this morning.  Was told by Dr. Pat Patrick previously if ever pain come to ED right away

## 2015-04-20 NOTE — Discharge Instructions (Signed)
Please follow-up with [facility] surgical this week. Return to the emergency room right away if you develop vomiting, severe pain, you have noticed that the hernia site is red or becomes very hard or severely painful, or new concerns arise.  Hernia A hernia occurs when an internal organ pushes out through a weak spot in the abdominal wall. Hernias most commonly occur in the groin and around the navel. Hernias often can be pushed back into place (reduced). Most hernias tend to get worse over time. Some abdominal hernias can get stuck in the opening (irreducible or incarcerated hernia) and cannot be reduced. An irreducible abdominal hernia which is tightly squeezed into the opening is at risk for impaired blood supply (strangulated hernia). A strangulated hernia is a medical emergency. Because of the risk for an irreducible or strangulated hernia, surgery may be recommended to repair a hernia. CAUSES   Heavy lifting.  Prolonged coughing.  Straining to have a bowel movement.  A cut (incision) made during an abdominal surgery. HOME CARE INSTRUCTIONS   Bed rest is not required. You may continue your normal activities.  Avoid lifting more than 10 pounds (4.5 kg) or straining.  Cough gently. If you are a smoker it is best to stop. Even the best hernia repair can break down with the continual strain of coughing. Even if you do not have your hernia repaired, a cough will continue to aggravate the problem.  Do not wear anything tight over your hernia. Do not try to keep it in with an outside bandage or truss. These can damage abdominal contents if they are trapped within the hernia sac.  Eat a normal diet.  Avoid constipation. Straining over long periods of time will increase hernia size and encourage breakdown of repairs. If you cannot do this with diet alone, stool softeners may be used. SEEK IMMEDIATE MEDICAL CARE IF:   You have a fever.  You develop increasing abdominal pain.  You feel nauseous or  vomit.  Your hernia is stuck outside the abdomen, looks discolored, feels hard, or is tender.  You have any changes in your bowel habits or in the hernia that are unusual for you.  You have increased pain or swelling around the hernia.  You cannot push the hernia back in place by applying gentle pressure while lying down. MAKE SURE YOU:   Understand these instructions.  Will watch your condition.  Will get help right away if you are not doing well or get worse. Document Released: 07/26/2005 Document Revised: 10/18/2011 Document Reviewed: 03/14/2008 [facility] Patient Information 2015 [place], [place]. This information is not intended to replace advice given to you by your health care provider. Make sure you discuss any questions you have with your health care provider.

## 2015-04-20 NOTE — ED Notes (Signed)
MD at bedside. 

## 2015-04-29 ENCOUNTER — Encounter: Payer: Self-pay | Admitting: Surgery

## 2015-04-29 ENCOUNTER — Ambulatory Visit (INDEPENDENT_AMBULATORY_CARE_PROVIDER_SITE_OTHER): Payer: Medicare Other | Admitting: Surgery

## 2015-04-29 VITALS — BP 139/87 | HR 59 | Temp 98.5°F | Ht 62.0 in | Wt 119.0 lb

## 2015-04-29 DIAGNOSIS — K439 Ventral hernia without obstruction or gangrene: Secondary | ICD-10-CM

## 2015-04-29 NOTE — Progress Notes (Signed)
Surgery History and Physical  CC: Epigastric bulge  HPI: Yolanda Jimenez is a pleasant 69 yo F who presents with nonreducible epigastric bulge.  Was seen in ER recently for pain in this area.  CT scan showed some fat in hernia.  Has had a history of cholecystectomy and noticed the bulge a few years later.  Has had for approx 3-4 years without pain and was seen by Dr. Pat Patrick years ago who did not recommend repair at that time.  Currently pain free.  No fevers/chills, night sweats, shortness of breath, chest pain, abdominal pain, nausea/vomiting, diarrhea/constipation, dysuria/hematuria.  Active Ambulatory Problems    Diagnosis Date Noted  . COLONIC POLYPS, ADENOMATOUS 09/05/2007  . HYPERLIPIDEMIA 09/05/2007  . IRON DEFICIENCY 09/05/2007  . BARRETTS ESOPHAGUS 09/05/2007  . DIVERTICULOSIS, COLON 09/05/2007  . IRRITABLE BOWEL SYNDROME 09/05/2007  . ARTERIOVENOUS MALFORMATION 09/05/2007  . Diarrhea 03/05/2013  . Diabetes mellitus, type 2 08/29/2014  . Combined fat and carbohydrate induced hyperlipemia 08/29/2014   Resolved Ambulatory Problems    Diagnosis Date Noted  . No Resolved Ambulatory Problems   Past Medical History  Diagnosis Date  . IBS (irritable bowel syndrome)   . Hyperlipidemia   . Diverticulosis   . Hx of adenomatous colonic polyps   . Barrett esophagus   . AVM (arteriovenous malformation)   . Iron deficiency anemia   . Microscopic hematuria   . PVD (peripheral vascular disease)   . Carotid stenosis   . Osteoporosis   . Insomnia   . C. difficile diarrhea   . GERD (gastroesophageal reflux disease)   . Heart murmur   . Kidney cysts   . Diabetes mellitus without complication      Medication List       This list is accurate as of: 04/29/15 10:08 AM.  Always use your most recent med list.               calcium carbonate 200 MG capsule  Take 250 mg by mouth daily.     diazepam 5 MG tablet  Commonly known as:  VALIUM     IMODIUM PO  Take 1 tablet by mouth as  needed.     magnesium oxide 400 MG tablet  Commonly known as:  MAG-OX  Take 400 mg by mouth daily.     omeprazole 20 MG capsule  Commonly known as:  PRILOSEC  Take 2 tablets by mouth every morning (30 minutes before breakfast) and 1 tablet by mouth at bedtime.     PRESERVISION AREDS 2 PO  Take 1 tablet by mouth daily.     simvastatin 40 MG tablet  Commonly known as:  ZOCOR  Take 40 mg by mouth at bedtime.     temazepam 30 MG capsule  Commonly known as:  RESTORIL  Take 30 mg by mouth at bedtime as needed.     vitamin C 1000 MG tablet  Take 1,000 mg by mouth daily.     Vitamin D 1000 UNITS capsule  Take 1,000 Units by mouth daily.       Family History  Problem Relation Age of Onset  . Colon cancer Neg Hx   . Esophageal cancer Neg Hx   . Rectal cancer Neg Hx   . Stomach cancer Neg Hx   . Diabetes Mother   . Heart disease Mother   . Hypertension Mother   . Diabetes Son   . Diabetes Sister   . Inflammatory bowel disease Father     diverticulitis  .  Multiple myeloma Father   . Hypertension Son   . Diabetes Maternal Grandmother    Social History   Social History  . Marital Status: Single    Spouse Name: N/A  . Number of Children: 2  . Years of Education: N/A   Occupational History  . Retired    Social History Main Topics  . Smoking status: Current Every Day Smoker -- 0.50 packs/day    Types: Cigarettes  . Smokeless tobacco: Never Used     Comment: she has patches to start just not started yet.   . Alcohol Use: Yes     Comment: socially  . Drug Use: No  . Sexual Activity: Not on file   Other Topics Concern  . Not on file   Social History Narrative   ROS: Full ROS obtained, pertinent positives and negatives as above  Blood pressure 139/87, pulse 59, temperature 98.5 F (36.9 C), temperature source Oral, height _0  (1.575 m), weight 119 lb (53.978 kg). 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  I have offered surgical repair vs observation.  I have explained the surgery and its risks and benefits and the risks and benefits of observation, including worsening pain and risk of incarcerated intestine requiring emergent surgery.  I have told her the signs and symptoms of incarcerated intestine as well as offered her to return in 1 month to evaluate how she does over this interval.  She would like to continue to observe.

## 2015-04-29 NOTE — Patient Instructions (Signed)
Told to call or return to ER if hernia becomes painful, hard, red, associated with nausea/vomiting

## 2015-06-12 ENCOUNTER — Encounter: Payer: Self-pay | Admitting: Emergency Medicine

## 2015-06-12 ENCOUNTER — Emergency Department: Payer: Medicare Other

## 2015-06-12 ENCOUNTER — Emergency Department
Admission: EM | Admit: 2015-06-12 | Discharge: 2015-06-12 | Disposition: A | Discharge status: Home / Self Care | Payer: Medicare Other | Attending: Emergency Medicine | Admitting: Emergency Medicine

## 2015-06-12 DIAGNOSIS — Z79899 Other long term (current) drug therapy: Secondary | ICD-10-CM | POA: Insufficient documentation

## 2015-06-12 DIAGNOSIS — R079 Chest pain, unspecified: Secondary | ICD-10-CM | POA: Diagnosis present

## 2015-06-12 DIAGNOSIS — E119 Type 2 diabetes mellitus without complications: Secondary | ICD-10-CM | POA: Insufficient documentation

## 2015-06-12 DIAGNOSIS — R0789 Other chest pain: Secondary | ICD-10-CM | POA: Insufficient documentation

## 2015-06-12 DIAGNOSIS — Z720 Tobacco use: Secondary | ICD-10-CM | POA: Diagnosis not present

## 2015-06-12 LAB — CBC
HCT: 37.7 % (ref 35.0–47.0)
Hemoglobin: 12.4 g/dL (ref 12.0–16.0)
MCH: 29.8 pg (ref 26.0–34.0)
MCHC: 32.9 g/dL (ref 32.0–36.0)
MCV: 90.6 fL (ref 80.0–100.0)
Platelets: 373 10*3/uL (ref 150–440)
RBC: 4.16 MIL/uL (ref 3.80–5.20)
RDW: 14.8 % — ABNORMAL HIGH (ref 11.5–14.5)
WBC: 7.3 10*3/uL (ref 3.6–11.0)

## 2015-06-12 LAB — BASIC METABOLIC PANEL
Anion gap: 5 (ref 5–15)
BUN: 15 mg/dL (ref 6–20)
CO2: 26 mmol/L (ref 22–32)
Calcium: 8.8 mg/dL — ABNORMAL LOW (ref 8.9–10.3)
Chloride: 107 mmol/L (ref 101–111)
Creatinine, Ser: 0.59 mg/dL (ref 0.44–1.00)
GFR calc Af Amer: >60 mL/min (ref >60)
GFR calc non Af Amer: >60 mL/min (ref >60)
Glucose, Bld: 121 mg/dL — ABNORMAL HIGH (ref 65–99)
Potassium: 3.9 mmol/L (ref 3.5–5.1)
Sodium: 138 mmol/L (ref 135–145)

## 2015-06-12 LAB — TROPONIN I: Troponin I: <0.03 ng/mL (ref <0.031)

## 2015-06-12 NOTE — ED Notes (Signed)
Per Dr Burlene Arnt, hold off on orders for now because pt may be discharged.

## 2015-06-12 NOTE — ED Provider Notes (Signed)
[facility] Emergency Department Provider Note  ____________________________________________   I have reviewed the triage vital signs and the nursing notes.   HISTORY  Chief Complaint Chest Pain    HPI Yolanda Jimenez is a 69 y.o. female Patient presents today complaining of left-sided chest wall pain. She states that his been several weeks since she last exercised in the gym. Yesterday, she did increased work with a machine that involves her pushing weights away from her chest with her arms. This morning, her chest felt a little bit sore and then she had some cramping-like discomfort in the pectoralis muscle at around 1:00. The pain is improving although she touches it or moves the wrong way it will flareup again. She is not had any dyspnea, denies any lower extremity pain or swelling, no recent travel no recent surgery no personal or family history of PE or DVT she has had no fevers chills or cough she is not short of breath she is watching TV in a chair and has no complaints at this time. She states "I think I pulled a muscle". patient has never had any coronary artery disease, she is not baseline hypertensive, she is a nonsmoker, her family history is positive for CAD in her mother. Patient has no antecedent exertional symptoms.the pain is a cramping sharp-like discomfort there is no diaphoresis there is no nausea there is no vomiting there is no radiation she can pinpoint the exact spots where it hurts.  Past Medical History  Diagnosis Date  . IBS (irritable bowel syndrome)   . Hyperlipidemia   . Diverticulosis   . Hx of adenomatous colonic polyps   . Barrett esophagus   . AVM (arteriovenous malformation)   . Iron deficiency anemia   . Microscopic hematuria   . PVD (peripheral vascular disease) ([place])   . Carotid stenosis   . Osteoporosis     mild  . Insomnia   . C. difficile diarrhea   . GERD (gastroesophageal reflux disease)   . Heart murmur   . Kidney  cysts     hx UTIs  . Diabetes mellitus without complication [facility])     Patient Active Problem List   Diagnosis Date Noted  . Diabetes mellitus, type 2 ([place]) 08/29/2014  . Combined fat and carbohydrate induced hyperlipemia 08/29/2014  . Diarrhea 03/05/2013  . COLONIC POLYPS, ADENOMATOUS 09/05/2007  . HYPERLIPIDEMIA 09/05/2007  . IRON DEFICIENCY 09/05/2007  . BARRETTS ESOPHAGUS 09/05/2007  . DIVERTICULOSIS, COLON 09/05/2007  . IRRITABLE BOWEL SYNDROME 09/05/2007  . ARTERIOVENOUS MALFORMATION 09/05/2007    Past Surgical History  Procedure Laterality Date  . Cholecystectomy    . Cervical cone biopsy    . Bunionectomy      right foot  . Foot surgery      right 2nd toe  . Tubal ligation    . Cataract extraction      both eyes  . Colonoscopy    . Upper gastrointestinal endoscopy      Current Outpatient Rx  Name  Route  Sig  Dispense  Refill  . Ascorbic Acid (VITAMIN C) 1000 MG tablet   Oral   Take 1,000 mg by mouth daily.           . calcium carbonate 200 MG capsule   Oral   Take 250 mg by mouth daily.           . Cholecalciferol (VITAMIN D) 1000 UNITS capsule   Oral   Take 1,000 Units by mouth  daily.           . diazepam (VALIUM) 5 MG tablet               . Loperamide HCl (IMODIUM PO)   Oral   Take 1 tablet by mouth as needed.         . magnesium oxide (MAG-OX) 400 MG tablet   Oral   Take 400 mg by mouth daily.         . Multiple Vitamins-Minerals (PRESERVISION AREDS 2 PO)   Oral   Take 1 tablet by mouth daily.         [name] omeprazole (PRILOSEC) 20 MG capsule      Take 2 tablets by mouth every morning (30 minutes before breakfast) and 1 tablet by mouth at bedtime.   270 capsule   3   . simvastatin (ZOCOR) 40 MG tablet   Oral   Take 40 mg by mouth at bedtime.           . temazepam (RESTORIL) 30 MG capsule   Oral   Take 30 mg by mouth at bedtime as needed.             Allergies Codeine and Hydrocodone-acetaminophen  Family  History  Problem Relation Age of Onset  . Colon cancer Neg Hx   . Esophageal cancer Neg Hx   . Rectal cancer Neg Hx   . Stomach cancer Neg Hx   . Diabetes Mother   . Heart disease Mother   . Hypertension Mother   . Diabetes Son   . Diabetes Sister   . Inflammatory bowel disease Father     diverticulitis  . Multiple myeloma Father   . Hypertension Son   . Diabetes Maternal Grandmother     Social History Social History  Substance Use Topics  . Smoking status: Current Every Day Smoker -- 0.50 packs/day    Types: Cigarettes  . Smokeless tobacco: Never Used     Comment: she has patches to start just not started yet.   . Alcohol Use: Yes     Comment: socially    Review of Systems Constitutional: No fever/chills Eyes: No visual changes. ENT: No sore throat. No stiff neck no neck pain Cardiovascular: Denies chest pain. Respiratory: Denies shortness of breath. Gastrointestinal:   no vomiting.  No diarrhea.  No constipation. Genitourinary: Negative for dysuria. Musculoskeletal: Negative lower extremity swelling Skin: Negative for rash. Neurological: Negative for headaches, focal weakness or numbness. 10-point ROS otherwise negative.  ____________________________________________   PHYSICAL EXAM:  VITAL SIGNS: ED Triage Vitals  Enc Vitals Group     BP 06/12/15 1433 140/79 mmHg     Pulse Rate 06/12/15 1433 62     Resp 06/12/15 1433 18     Temp 06/12/15 1433 97.9 F (36.6 C)     Temp Source 06/12/15 1433 Oral     SpO2 06/12/15 1433 98 %     Weight 06/12/15 1433 117 lb (53.071 kg)     Height 06/12/15 1433 5' 2"  (1.575 m)     Head Cir --      Peak Flow --      Pain Score 06/12/15 1431 3     Pain Loc --      Pain Edu? --      Excl. in [place]? --     Constitutional: Alert and oriented. Well appearing and in no acute distress. Eyes: Conjunctivae are normal. PERRL. EOMI. Head: Atraumatic. Nose: No congestion/rhinnorhea. Mouth/Throat: Mucous membranes  are moist.   Oropharynx non-erythematous. Neck: No stridor.   Nontender with no meningismus Cardiovascular: Normal rate, regular rhythm. Grossly normal heart sounds.  Good peripheral circulation. Respiratory: Normal respiratory effort.  No retractions. Lungs CTAB. Abdominal: Soft and nontender. No distention. No guarding no rebound Back:  There is no focal tenderness or step off there is no midline tenderness there are no lesions noted. there is no CVA tenderness Chest: There are no shingles lesions, the chest wall is normal appearance. Patient's husband is the room, she declines other chaperone. The patient'sleft pectoralis muscle at origin and insertion as well as along the chest wall is tender to palpation. There is no abscess is not red is not swollen there is not hot to touch there is no crepitus there is no flail chest. My touch this area patient states "ouch that's the pain right there", she can specify the exact spot with the pain is the worst. This is the muscle group that was the most sore yesterday after doing her exercise. Musculoskeletal: No lower extremity tenderness. No joint effusions, no DVT signs strong distal pulses no edema Neurologic:  Normal speech and language. No gross focal neurologic deficits are appreciated.  Skin:  Skin is warm, dry and intact. No rash noted. Psychiatric: Mood and affect are normal. Speech and behavior are normal.  ____________________________________________   LABS (all labs ordered are listed, but only abnormal results are displayed)  Labs Reviewed  BASIC METABOLIC PANEL - Abnormal; Notable for the following:    Glucose, Bld 121 (*)    Calcium 8.8 (*)    All other components within normal limits  CBC - Abnormal; Notable for the following:    RDW 14.8 (*)    All other components within normal limits  TROPONIN I   ____________________________________________  EKG  I personally interpreted EKG, normal sinus rhythm rate 63 bpm no acute ST elevation or  acute ST depression normal axis unremarkable EKG ____________________________________________  RADIOLOGY  I personally reviewed x-ray ____________________________________________   PROCEDURES  Procedure(s) performed: None  Critical Care performed: None  ____________________________________________   INITIAL IMPRESSION / ASSESSMENT AND PLAN / ED COURSE  Pertinent labs & imaging results that were available during my care of the patient were reviewed by me and considered in my medical decision making (see chart for details).  Visual very reproducible pain in her chest wall after exercising her pectoralis muscle yesterday. The patient does not appear to be suffering at this time for any evidence of ACS PE or dissection myocarditis pericarditis endocarditis pneumothorax or pneumonia. I am very reassured by her physical exam. Given her age I have offered to keep her here for serial cardiac enzymes, but she declines. She understands the risk of going home which, as I see, is likely minimal. However this was made available to her. The patient understands that it would help me definitively rule out CAD. The patient has had no antecedent or concomitant exertional symptoms and she will return if she does. She has had no leg swelling recent travels or other evidence of DVT and she has no real risk factors for that. Her symptoms are not consistent with PE. I do not think that it is in the patient's best interest to give her a dye load and put her in a CT scan for this. Given that the patient declines further workup, her etiology seems quite clearly related to her reproducible muscular skeletal tenderness, and there is no evidence of acute infectious cardiopulmonary or other pathology  we will discharge her home and her request with close outpatient follow-up ____________________________________________   FINAL CLINICAL IMPRESSION(S) / ED DIAGNOSES  Final diagnoses:  None     Schuyler Amor,  MD 06/12/15 [phone]

## 2015-06-12 NOTE — ED Notes (Signed)
Pt began with sharp left side chest pain while walking across living room just prior to coming.  CP eased while in triage.  Denies any other sx.

## 2015-06-12 NOTE — Discharge Instructions (Signed)
At this time, it does seem most consistent with a strained here pectoralis muscle. However, if you have increased pain, shortness of breath, nausea, or change your mind about further workup please return to the emergency department. You have declined to stay for repeat troponin, cardiac enzymes, which is not unreasonable but it does somewhat limit the way we can evaluate this. If you do have increased pain or you feel worse please return immediately to see [place].follow closely with your doctor.

## 2015-10-03 ENCOUNTER — Other Ambulatory Visit: Payer: Self-pay | Admitting: Internal Medicine

## 2015-10-03 DIAGNOSIS — Z1231 Encounter for screening mammogram for malignant neoplasm of breast: Secondary | ICD-10-CM

## 2015-10-21 ENCOUNTER — Ambulatory Visit
Admission: RE | Admit: 2015-10-21 | Discharge: 2015-10-21 | Disposition: A | Discharge status: Home / Self Care | Payer: Medicare Other | Source: Ambulatory Visit | Attending: Internal Medicine | Admitting: Internal Medicine

## 2015-10-21 DIAGNOSIS — Z1231 Encounter for screening mammogram for malignant neoplasm of breast: Secondary | ICD-10-CM | POA: Diagnosis present

## 2016-01-16 ENCOUNTER — Encounter: Payer: Self-pay | Admitting: Gastroenterology

## 2016-01-16 ENCOUNTER — Ambulatory Visit (INDEPENDENT_AMBULATORY_CARE_PROVIDER_SITE_OTHER): Payer: Medicare Other | Admitting: Gastroenterology

## 2016-01-16 VITALS — BP 110/70 | HR 76 | Ht 61.0 in | Wt 119.0 lb

## 2016-01-16 DIAGNOSIS — K589 Irritable bowel syndrome without diarrhea: Secondary | ICD-10-CM

## 2016-01-16 DIAGNOSIS — K439 Ventral hernia without obstruction or gangrene: Secondary | ICD-10-CM | POA: Diagnosis not present

## 2016-01-16 DIAGNOSIS — R197 Diarrhea, unspecified: Secondary | ICD-10-CM | POA: Diagnosis not present

## 2016-01-16 MED ORDER — CHOLESTYRAMINE 4 G PO PACK: 4.0000 g | PACK | Freq: Every day | ORAL | Status: DC

## 2016-01-16 NOTE — Patient Instructions (Signed)
Low FODMAP Diet given VSL #3 one packet daily, you can purchase this over the counter  We will send in your prescription to your pharmacy

## 2016-02-09 NOTE — Progress Notes (Signed)
Yolanda Jimenez    [id number]    04/09/1946  Primary Care Physician:Mark Roselee Culver, MD  Referring Physician: Rusty Aus, MD [facility], [place] [address]  Chief complaint:  Diarrhea  HPI: 35 yr F with s/p cholecystectomy  Here for follow up visit. She has chronic diarrhea and takes imodium as needed. She was also on Colestipol, discontinued unclear when. Last seen in [facility] 2015. Past GI work up includes random biopsies of the colon were negative for microscopic colitis. She had a Barrett's esophagus om EGD in 2012 but this was not confirmed on a repeat endoscopy in October 2014. She had mild elevation of 24-hour urine collection for HIAA. A repeat exam showed a normal value of 3.3. 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 Last colonoscopy in 07/2014 2 tubular adenomas were removed.  Patient c/o of having explosive BM mostly in the morning after she wakes, 1-2 BM a day. Sometimes soft and sometimes watery or semi formed. Denies having multiple bowel movements though out the day or fecal incontinence. No noctural symptoms. She also c/o bulge in the belly, ventral hernia. Denies any nausea, vomiting, abdominal pain, melena or bright red blood per rectum    Outpatient Encounter Prescriptions as of 01/16/2016  Medication Sig  . Ascorbic Acid (VITAMIN C) 1000 MG tablet Take 1,000 mg by mouth daily.    . calcium carbonate 200 MG capsule Take 250 mg by mouth daily.    . Cholecalciferol (VITAMIN D) 1000 UNITS capsule Take 1,000 Units by mouth daily.    . diazepam (VALIUM) 5 MG tablet Take 5 mg by mouth as needed.   . Loperamide HCl (IMODIUM PO) Take 1 tablet by mouth as needed.  . magnesium oxide (MAG-OX) 400 MG tablet Take 400 mg by mouth daily.  . Multiple Vitamins-Minerals (PRESERVISION AREDS 2 PO) Take 1 tablet by mouth daily.  [name] omeprazole (PRILOSEC) 20 MG capsule  Take 2 tablets by mouth every morning (30 minutes before breakfast) and 1 tablet by mouth at bedtime.  . simvastatin (ZOCOR) 40 MG tablet Take 40 mg by mouth at bedtime.    . temazepam (RESTORIL) 30 MG capsule Take 30 mg by mouth at bedtime as needed.    . cholestyramine (QUESTRAN) 4 g packet Take 1 packet (4 g total) by mouth daily.   No facility-administered encounter medications on file as of 01/16/2016.    Allergies as of 01/16/2016 - Review Complete 01/16/2016  Allergen Reaction Noted  . Codeine  08/23/2008  . Hydrocodone-acetaminophen Other (See Comments) 04/25/2015    Past Medical History  Diagnosis Date  . IBS (irritable bowel syndrome)   . Hyperlipidemia   . Diverticulosis   . Hx of adenomatous colonic polyps   . Barrett esophagus   . AVM (arteriovenous malformation)   . Iron deficiency anemia   . Microscopic hematuria   . PVD (peripheral vascular disease) ([place])   . Carotid stenosis   . Osteoporosis     mild  . Insomnia   . C. difficile diarrhea   . GERD (gastroesophageal reflux disease)   . Heart murmur   . Kidney cysts     hx UTIs  . Diabetes mellitus without complication [facility])     Past Surgical History  Procedure Laterality Date  . Cholecystectomy    . Cervical cone biopsy    . Bunionectomy  right foot  . Foot surgery      right 2nd toe  . Tubal ligation    . Cataract extraction      both eyes  . Colonoscopy    . Upper gastrointestinal endoscopy      Family History  Problem Relation Age of Onset  . Colon cancer Neg Hx   . Esophageal cancer Neg Hx   . Rectal cancer Neg Hx   . Stomach cancer Neg Hx   . Diabetes Mother   . Heart disease Mother   . Hypertension Mother   . Diabetes Son   . Diabetes Sister   . Inflammatory bowel disease Father     diverticulitis  . Multiple myeloma Father   . Hypertension Son   . Diabetes Maternal Grandmother     Social History   Social History  . Marital Status: Single    Spouse Name: N/A  . Number  of Children: 2  . Years of Education: N/A   Occupational History  . Retired    Social History Main Topics  . Smoking status: Current Every Day Smoker -- 0.50 packs/day    Types: Cigarettes  . Smokeless tobacco: Never Used     Comment: she has patches to start just not started yet.   . Alcohol Use: Yes     Comment: socially  . Drug Use: No  . Sexual Activity: Not on file   Other Topics Concern  . Not on file   Social History Narrative      Review of systems: Review of Systems  Constitutional: Negative for fever and chills.  HENT: Negative.   Eyes: Negative for blurred vision.  Respiratory: Negative for cough, shortness of breath and wheezing.   Cardiovascular: Negative for chest pain and palpitations.  Gastrointestinal: as per HPI Genitourinary: Negative for dysuria, urgency, frequency and hematuria.  Musculoskeletal: Negative for myalgias, back pain and joint pain.  Skin: Negative for itching and rash.  Neurological: Negative for dizziness, tremors, focal weakness, seizures and loss of consciousness.  Endo/Heme/Allergies: Negative for environmental allergies.  Psychiatric/Behavioral: Negative for depression, suicidal ideas and hallucinations.  All other systems reviewed and are negative.   Physical Exam: Filed Vitals:   01/16/16 1340  BP: 110/70  Pulse: 76   Gen:      No acute distress HEENT:  EOMI, sclera anicteric Neck:     No masses; no thyromegaly Lungs:    Clear to auscultation bilaterally; normal respiratory effort CV:         Regular rate and rhythm; no murmurs Abd:      + bowel sounds; soft, non-tender; no palpable masses, no distension. Small reducible ventral hernia Ext:    No edema; adequate peripheral perfusion Skin:      Warm and dry; no rash Neuro: alert and oriented x 3 Psych: normal mood and affect  Data Reviewed:  Reviewed chart in epic   Assessment and Plan/Recommendations:  50 yr F s/p cholecystectomy with chronic diarrhea likely  IBS-predominant diarrhea here to establish care Advised patient to do a trial of Low fodmap diet and pro biotic VSL# 3 Cholestyramine as needed Avoid large meals or high fat diet Reassured regarding ventral hernia and will not recommend surgery at this point Due for surveillance colonoscopy in 07/2019 Return in 3-4 months   K. Denzil Magnuson , MD [phone] Mon-Fri 8a-5p [phone] after 5p, weekends, holidays  CC: Rusty Aus, MD

## 2016-09-03 DIAGNOSIS — Z0000 Encounter for general adult medical examination without abnormal findings: Secondary | ICD-10-CM | POA: Insufficient documentation

## 2016-09-13 DIAGNOSIS — N281 Cyst of kidney, acquired: Secondary | ICD-10-CM | POA: Insufficient documentation

## 2016-09-15 ENCOUNTER — Encounter (INDEPENDENT_AMBULATORY_CARE_PROVIDER_SITE_OTHER): Payer: Self-pay

## 2016-09-15 ENCOUNTER — Ambulatory Visit (INDEPENDENT_AMBULATORY_CARE_PROVIDER_SITE_OTHER): Payer: Self-pay | Admitting: Vascular Surgery

## 2016-09-17 DIAGNOSIS — M79676 Pain in unspecified toe(s): Secondary | ICD-10-CM | POA: Insufficient documentation

## 2016-09-20 ENCOUNTER — Other Ambulatory Visit: Payer: Self-pay | Admitting: Internal Medicine

## 2016-09-20 DIAGNOSIS — Z1231 Encounter for screening mammogram for malignant neoplasm of breast: Secondary | ICD-10-CM

## 2016-10-13 ENCOUNTER — Ambulatory Visit (INDEPENDENT_AMBULATORY_CARE_PROVIDER_SITE_OTHER): Payer: Self-pay | Admitting: Vascular Surgery

## 2016-10-13 ENCOUNTER — Ambulatory Visit (INDEPENDENT_AMBULATORY_CARE_PROVIDER_SITE_OTHER): Payer: Medicare Other

## 2016-10-13 ENCOUNTER — Other Ambulatory Visit (INDEPENDENT_AMBULATORY_CARE_PROVIDER_SITE_OTHER): Payer: Self-pay | Admitting: Vascular Surgery

## 2016-10-13 DIAGNOSIS — E119 Type 2 diabetes mellitus without complications: Secondary | ICD-10-CM

## 2016-10-13 DIAGNOSIS — R230 Cyanosis: Secondary | ICD-10-CM

## 2016-10-13 DIAGNOSIS — I6523 Occlusion and stenosis of bilateral carotid arteries: Secondary | ICD-10-CM | POA: Diagnosis not present

## 2016-10-15 ENCOUNTER — Ambulatory Visit (INDEPENDENT_AMBULATORY_CARE_PROVIDER_SITE_OTHER): Payer: Medicare Other | Admitting: Vascular Surgery

## 2016-10-15 VITALS — BP 130/79 | HR 61 | Resp 16 | Ht 62.0 in | Wt 113.0 lb

## 2016-10-15 DIAGNOSIS — I739 Peripheral vascular disease, unspecified: Secondary | ICD-10-CM | POA: Insufficient documentation

## 2016-10-15 DIAGNOSIS — I6523 Occlusion and stenosis of bilateral carotid arteries: Secondary | ICD-10-CM | POA: Diagnosis not present

## 2016-10-15 DIAGNOSIS — E785 Hyperlipidemia, unspecified: Secondary | ICD-10-CM

## 2016-10-15 DIAGNOSIS — E119 Type 2 diabetes mellitus without complications: Secondary | ICD-10-CM

## 2016-10-15 DIAGNOSIS — I6529 Occlusion and stenosis of unspecified carotid artery: Secondary | ICD-10-CM | POA: Insufficient documentation

## 2016-10-15 NOTE — Assessment & Plan Note (Signed)
She has known small vessel disease but her ABIs today remained preserved and normal at 1.0 bilaterally. Her digital pressure is reduced bilaterally. No current limb threatening symptoms. Plan to recheck in 1 year.

## 2016-10-15 NOTE — Assessment & Plan Note (Signed)
Her carotid duplex today reveals stable 1-39% carotid artery stenosis bilaterally without significant progression from her previous study. No worrisome symptoms. Recheck in 1 year.

## 2016-10-15 NOTE — Assessment & Plan Note (Signed)
lipid control important in reducing the progression of atherosclerotic disease. Continue statin therapy  

## 2016-10-15 NOTE — Assessment & Plan Note (Signed)
blood glucose control important in reducing the progression of atherosclerotic disease. Also, involved in wound healing. On appropriate medications.  

## 2016-10-15 NOTE — Progress Notes (Signed)
MRN : [id number]  Yolanda Jimenez is a 71 y.o. (1946/03/14) female who presents with chief complaint of  Chief Complaint  Patient presents with  . Re-evaluation    Ultrasound follow up done on 10/13/16  .  History of Present Illness: Patient returns today in follow up of Multiple vascular issues. She has had an area on the inside of her right second toe which has had intermittent ulceration. Currently healed. She has known small vessel disease but her ABIs today remained preserved and normal at 1.0 bilaterally. Her digital pressure is reduced bilaterally. She is also followed for carotid disease. She denies any focal neurologic symptoms. Specifically, the patient denies amaurosis fugax, speech or swallowing difficulties, or arm or leg weakness or numbness. Her carotid duplex today reveals stable 1-39% carotid artery stenosis bilaterally without significant progression from her previous study.  Current Outpatient Prescriptions  Medication Sig Dispense Refill  . Ascorbic Acid (VITAMIN C) 1000 MG tablet Take 1,000 mg by mouth daily.      . calcium carbonate 200 MG capsule Take 250 mg by mouth daily.      . Cholecalciferol (VITAMIN D) 1000 UNITS capsule Take 1,000 Units by mouth daily.      . cholestyramine (QUESTRAN) 4 g packet Take 1 packet (4 g total) by mouth daily. 30 each 6  . diazepam (VALIUM) 5 MG tablet Take 5 mg by mouth as needed.     . Loperamide HCl (IMODIUM PO) Take 1 tablet by mouth as needed.    . magnesium oxide (MAG-OX) 400 MG tablet Take 400 mg by mouth daily.    . Multiple Vitamins-Minerals (PRESERVISION AREDS 2 PO) Take 1 tablet by mouth daily.    [name] omeprazole (PRILOSEC) 20 MG capsule Take 2 tablets by mouth every morning (30 minutes before breakfast) and 1 tablet by mouth at bedtime. 270 capsule 3  . simvastatin (ZOCOR) 40 MG tablet Take 40 mg by mouth at bedtime.      . temazepam (RESTORIL) 30 MG capsule Take 30 mg by mouth at bedtime as needed.       No current  facility-administered medications for this visit.     Past Medical History:  Diagnosis Date  . AVM (arteriovenous malformation)   . Barrett esophagus   . C. difficile diarrhea   . Carotid stenosis   . Diabetes mellitus without complication ([place])   . Diverticulosis   . GERD (gastroesophageal reflux disease)   . Heart murmur   . Hx of adenomatous colonic polyps   . Hyperlipidemia   . IBS (irritable bowel syndrome)   . Insomnia   . Iron deficiency anemia   . Kidney cysts    hx UTIs  . Microscopic hematuria   . Osteoporosis    mild  . PVD (peripheral vascular disease) ([place])     Past Surgical History:  Procedure Laterality Date  . BUNIONECTOMY     right foot  . CATARACT EXTRACTION     both eyes  . CERVICAL CONE BIOPSY    . CHOLECYSTECTOMY    . COLONOSCOPY    . FOOT SURGERY     right 2nd toe  . TUBAL LIGATION    . UPPER GASTROINTESTINAL ENDOSCOPY      Social History Social History  Substance Use Topics  . Smoking status: Current Every Day Smoker    Packs/day: 0.50    Types: Cigarettes  . Smokeless tobacco: Never Used     Comment: she has patches to start just  not started yet.   . Alcohol use Yes     Comment: socially    Family History Family History  Problem Relation Age of Onset  . Colon cancer Neg Hx   . Esophageal cancer Neg Hx   . Rectal cancer Neg Hx   . Stomach cancer Neg Hx   . Diabetes Mother   . Heart disease Mother   . Hypertension Mother   . Diabetes Son   . Diabetes Sister   . Inflammatory bowel disease Father     diverticulitis  . Multiple myeloma Father   . Hypertension Son   . Diabetes Maternal Grandmother     Allergies  Allergen Reactions  . Codeine     "felt drunk, crazy"  . Hydrocodone-Acetaminophen Other (See Comments)    Confusion/delirium     REVIEW OF SYSTEMS (Negative unless checked)  Constitutional: [] Weight loss  [] Fever  [] Chills Cardiac: [] Chest pain   [] Chest pressure   [] Palpitations   [] Shortness of breath  when laying flat   [] Shortness of breath at rest   [] Shortness of breath with exertion. Vascular:  [] Pain in legs with walking   [] Pain in legs at rest   [] Pain in legs when laying flat   [] Claudication   [] Pain in feet when walking  [x] Pain in feet at rest  [] Pain in feet when laying flat   [] History of DVT   [] Phlebitis   [] Swelling in legs   [] Varicose veins   [] Non-healing ulcers Pulmonary:   [] Uses home oxygen   [] Productive cough   [] Hemoptysis   [] Wheeze  [] COPD   [] Asthma Neurologic:  [] Dizziness  [] Blackouts   [] Seizures   [] History of stroke   [] History of TIA  [] Aphasia   [] Temporary blindness   [] Dysphagia   [] Weakness or numbness in arms   [] Weakness or numbness in legs Musculoskeletal:  [] Arthritis   [] Joint swelling   [] Joint pain   [] Low back pain Hematologic:  [] Easy bruising  [] Easy bleeding   [] Hypercoagulable state   [] Anemic   Gastrointestinal:  [] Blood in stool   [] Vomiting blood  [] Gastroesophageal reflux/heartburn   [] Abdominal pain Genitourinary:  [] Chronic kidney disease   [] Difficult urination  [] Frequent urination  [] Burning with urination   [] Hematuria Skin:  [] Rashes   [x] Ulcers   [x] Wounds Psychological:  [] History of anxiety   []  History of major depression.  Physical Examination  BP 130/79 (BP Location: Right Arm)   Pulse 61   Resp 16   Ht 5' 2"  (1.575 m)   Wt 51.3 kg (113 lb)   BMI 20.67 kg/m  Gen:  WD/WN, NAD Head: [place]/AT, No temporalis wasting. Ear/Nose/Throat: Hearing grossly intact, nares w/o erythema or drainage, trachea midline Eyes: Conjunctiva clear. Sclera non-icteric Neck: Supple.  No JVD.  Pulmonary:  Good air movement, no use of accessory muscles.  Cardiac: RRR, normal S1, S2 Vascular:  Vessel Right Left  Radial Palpable Palpable  Ulnar Palpable Palpable  Brachial Palpable Palpable  Carotid Palpable, without bruit Palpable, without bruit  Aorta Not palpable N/A  Femoral Palpable Palpable  Popliteal Palpable Palpable  PT Palpable  Palpable  DP Palpable Palpable   Gastrointestinal: soft, non-tender/non-distended. No guarding/reflex.  Musculoskeletal: M/S 5/5 throughout.  No deformity or atrophy. No edema. Cyanosis is present in right second toe.   Neurologic: Sensation grossly intact in extremities.  Symmetrical.  Speech is fluent.  Psychiatric: Judgment intact, Mood & affect appropriate for pt's clinical situation. Dermatologic: No rashes or ulcers noted.  No cellulitis or open wounds.  Lymph : No Cervical, Axillary, or Inguinal lymphadenopathy.      Labs Recent Results (from the past 2160 hour(s))  VAS US CAROTID     Status: None (In process)   Collection Time: 10/13/16  9:40 AM  Result Value Ref Range   Right CCA prox sys 80 cm/s   Right CCA prox dias 15 cm/s   Right cca dist sys -55 cm/s   Left CCA prox sys 76 cm/s   Left CCA prox dias 25 cm/s   Left CCA dist sys 49 cm/s   Left CCA dist dias 17 cm/s   Left ICA prox sys -43 cm/s   Left ICA prox dias -12 cm/s   Left ICA dist sys -48 cm/s   Left ICA dist dias -16 cm/s   RIGHT CCA MID DIAS 18.00 cm/s   RIGHT ECA DIAS -9.00 cm/s   LEFT ECA DIAS 12.00 cm/s    Radiology No results found.  Assessment/Plan  Diabetes mellitus, type 2 blood glucose control important in reducing the progression of atherosclerotic disease. Also, involved in wound healing. On appropriate medications.   Hyperlipidemia lipid control important in reducing the progression of atherosclerotic disease. Continue statin therapy   PAD (peripheral artery disease) ([place])  She has known small vessel disease but her ABIs today remained preserved and normal at 1.0 bilaterally. Her digital pressure is reduced bilaterally. No current limb threatening symptoms. Plan to recheck in 1 year.  Carotid stenosis Her carotid duplex today reveals stable 1-39% carotid artery stenosis bilaterally without significant progression from her previous study. No worrisome symptoms. Recheck in 1  year.    Leotis Pain, MD  10/15/2016 10:44 AM    This note was created with Dragon medical transcription system.  Any errors from dictation are purely unintentional

## 2016-10-25 ENCOUNTER — Ambulatory Visit
Admission: RE | Admit: 2016-10-25 | Discharge: 2016-10-25 | Disposition: A | Discharge status: Home / Self Care | Payer: Medicare Other | Source: Ambulatory Visit | Attending: Internal Medicine | Admitting: Internal Medicine

## 2016-10-25 DIAGNOSIS — Z1231 Encounter for screening mammogram for malignant neoplasm of breast: Secondary | ICD-10-CM | POA: Diagnosis present

## 2017-07-26 ENCOUNTER — Other Ambulatory Visit: Payer: Self-pay

## 2017-07-26 DIAGNOSIS — Z8719 Personal history of other diseases of the digestive system: Secondary | ICD-10-CM

## 2017-08-22 ENCOUNTER — Encounter: Payer: Self-pay | Admitting: *Deleted

## 2017-08-22 ENCOUNTER — Other Ambulatory Visit: Payer: Self-pay

## 2017-08-26 NOTE — Discharge Instructions (Signed)
General Anesthesia, Adult, Care After °These instructions provide you with information about caring for yourself after your procedure. Your health care provider may also give you more specific instructions. Your treatment has been planned according to current medical practices, but problems sometimes occur. Call your health care provider if you have any problems or questions after your procedure. °What can I expect after the procedure? °After the procedure, it is common to have: °· Vomiting. °· A sore throat. °· Mental slowness. ° °It is common to feel: °· Nauseous. °· Cold or shivery. °· Sleepy. °· Tired. °· Sore or achy, even in parts of your body where you did not have surgery. ° °Follow these instructions at home: °For at least 24 hours after the procedure: °· Do not: °? Participate in activities where you could fall or become injured. °? Drive. °? Use heavy machinery. °? Drink alcohol. °? Take sleeping pills or medicines that cause drowsiness. °? Make important decisions or sign legal documents. °? Take care of children on your own. °· Rest. °Eating and drinking °· If you vomit, drink water, juice, or soup when you can drink without vomiting. °· Drink enough fluid to keep your urine clear or pale yellow. °· Make sure you have little or no nausea before eating solid foods. °· Follow the diet recommended by your health care provider. °General instructions °· Have a responsible adult stay with you until you are awake and alert. °· Return to your normal activities as told by your health care provider. Ask your health care provider what activities are safe for you. °· Take over-the-counter and prescription medicines only as told by your health care provider. °· If you smoke, do not smoke without supervision. °· Keep all follow-up visits as told by your health care provider. This is important. °Contact a health care provider if: °· You continue to have nausea or vomiting at home, and medicines are not helpful. °· You  cannot drink fluids or start eating again. °· You cannot urinate after 8-12 hours. °· You develop a skin rash. °· You have fever. °· You have increasing redness at the site of your procedure. °Get help right away if: °· You have difficulty breathing. °· You have chest pain. °· You have unexpected bleeding. °· You feel that you are having a life-threatening or urgent problem. °This information is not intended to replace advice given to you by your health care provider. Make sure you discuss any questions you have with your health care provider. °Document Released: 11/01/2000 Document Revised: 12/29/2015 Document Reviewed: 07/10/2015 °Elsevier Interactive Patient Education © 2018 [employer] ° °

## 2017-08-29 ENCOUNTER — Encounter
Admission: RE | Disposition: A | Discharge status: Home / Self Care | Payer: Self-pay | Source: Ambulatory Visit | Attending: Gastroenterology

## 2017-08-29 ENCOUNTER — Ambulatory Visit
Admission: RE | Admit: 2017-08-29 | Discharge: 2017-08-29 | Disposition: A | Discharge status: Home / Self Care | Payer: Medicare Other | Source: Ambulatory Visit | Attending: Gastroenterology | Admitting: Gastroenterology

## 2017-08-29 ENCOUNTER — Ambulatory Visit: Payer: Medicare Other | Admitting: Anesthesiology

## 2017-08-29 DIAGNOSIS — Z87440 Personal history of urinary (tract) infections: Secondary | ICD-10-CM | POA: Diagnosis not present

## 2017-08-29 DIAGNOSIS — K449 Diaphragmatic hernia without obstruction or gangrene: Secondary | ICD-10-CM | POA: Insufficient documentation

## 2017-08-29 DIAGNOSIS — E1151 Type 2 diabetes mellitus with diabetic peripheral angiopathy without gangrene: Secondary | ICD-10-CM | POA: Insufficient documentation

## 2017-08-29 DIAGNOSIS — K210 Gastro-esophageal reflux disease with esophagitis: Secondary | ICD-10-CM | POA: Diagnosis not present

## 2017-08-29 DIAGNOSIS — G4700 Insomnia, unspecified: Secondary | ICD-10-CM | POA: Diagnosis not present

## 2017-08-29 DIAGNOSIS — E785 Hyperlipidemia, unspecified: Secondary | ICD-10-CM | POA: Diagnosis not present

## 2017-08-29 DIAGNOSIS — F17210 Nicotine dependence, cigarettes, uncomplicated: Secondary | ICD-10-CM | POA: Insufficient documentation

## 2017-08-29 DIAGNOSIS — A0472 Enterocolitis due to Clostridium difficile, not specified as recurrent: Secondary | ICD-10-CM | POA: Insufficient documentation

## 2017-08-29 DIAGNOSIS — Z8719 Personal history of other diseases of the digestive system: Secondary | ICD-10-CM

## 2017-08-29 DIAGNOSIS — K209 Esophagitis, unspecified without bleeding: Secondary | ICD-10-CM

## 2017-08-29 DIAGNOSIS — K589 Irritable bowel syndrome without diarrhea: Secondary | ICD-10-CM | POA: Insufficient documentation

## 2017-08-29 DIAGNOSIS — K2270 Barrett's esophagus without dysplasia: Secondary | ICD-10-CM | POA: Insufficient documentation

## 2017-08-29 DIAGNOSIS — Z79899 Other long term (current) drug therapy: Secondary | ICD-10-CM | POA: Insufficient documentation

## 2017-08-29 HISTORY — DX: Presence of dental prosthetic device (complete) (partial): Z97.2

## 2017-08-29 HISTORY — PX: ESOPHAGOGASTRODUODENOSCOPY (EGD) WITH PROPOFOL: SHX5813

## 2017-08-29 SURGERY — ESOPHAGOGASTRODUODENOSCOPY (EGD) WITH PROPOFOL
Anesthesia: General | Wound class: Clean Contaminated

## 2017-08-29 MED ORDER — LACTATED RINGERS IV SOLN: INTRAVENOUS | Status: DC

## 2017-08-29 MED ORDER — LACTATED RINGERS IV SOLN
INTRAVENOUS | Status: DC
  Administered 2017-08-29: 07:00:00 via INTRAVENOUS

## 2017-08-29 MED ORDER — SODIUM CHLORIDE 0.9 % IV SOLN: INTRAVENOUS | Status: DC

## 2017-08-29 MED ORDER — PROPOFOL 10 MG/ML IV BOLUS
INTRAVENOUS | Status: DC | PRN
  Administered 2017-08-29 (×2): 20 mg via INTRAVENOUS
  Administered 2017-08-29: 70 mg via INTRAVENOUS

## 2017-08-29 MED ORDER — GLYCOPYRROLATE 0.2 MG/ML IJ SOLN
INTRAMUSCULAR | Status: DC | PRN
  Administered 2017-08-29: 0.2 mg via INTRAVENOUS

## 2017-08-29 MED ORDER — LIDOCAINE HCL (CARDIAC) 20 MG/ML IV SOLN
INTRAVENOUS | Status: DC | PRN
  Administered 2017-08-29: 50 mg via INTRAVENOUS

## 2017-08-29 SURGICAL SUPPLY — 33 items
BALLN DILATOR 10-12 8 (BALLOONS)
BALLN DILATOR 12-15 8 (BALLOONS)
BALLN DILATOR 15-18 8 (BALLOONS)
BALLN DILATOR CRE 0-12 8 (BALLOONS)
BALLN DILATOR ESOPH 8 10 CRE (MISCELLANEOUS) IMPLANT
BALLOON DILATOR 12-15 8 (BALLOONS) IMPLANT
BALLOON DILATOR 15-18 8 (BALLOONS) IMPLANT
BALLOON DILATOR CRE 0-12 8 (BALLOONS) IMPLANT
BLOCK BITE 60FR ADLT L/F GRN (MISCELLANEOUS) ×2 IMPLANT
CANISTER SUCT 1200ML W/VALVE (MISCELLANEOUS) ×2 IMPLANT
CLIP HMST 235XBRD CATH ROT (MISCELLANEOUS) IMPLANT
CLIP RESOLUTION 360 11X235 (MISCELLANEOUS)
ELECT REM PT RETURN 9FT ADLT (ELECTROSURGICAL)
ELECTRODE REM PT RTRN 9FT ADLT (ELECTROSURGICAL) IMPLANT
FCP ESCP3.2XJMB 240X2.8X (MISCELLANEOUS)
FORCEPS BIOP RAD 4 LRG CAP 4 (CUTTING FORCEPS) ×2 IMPLANT
FORCEPS BIOP RJ4 240 W/NDL (MISCELLANEOUS)
FORCEPS ESCP3.2XJMB 240X2.8X (MISCELLANEOUS) IMPLANT
GOWN CVR UNV OPN BCK APRN NK (MISCELLANEOUS) ×2 IMPLANT
GOWN ISOL THUMB LOOP REG UNIV (MISCELLANEOUS) ×2
INJECTOR VARIJECT VIN23 (MISCELLANEOUS) IMPLANT
KIT DEFENDO VALVE AND CONN (KITS) IMPLANT
KIT ENDO PROCEDURE OLY (KITS) ×2 IMPLANT
MARKER SPOT ENDO TATTOO 5ML (MISCELLANEOUS) IMPLANT
RETRIEVER NET PLAT FOOD (MISCELLANEOUS) IMPLANT
SNARE SHORT THROW 13M SML OVAL (MISCELLANEOUS) IMPLANT
SNARE SHORT THROW 30M LRG OVAL (MISCELLANEOUS) IMPLANT
SPOT EX ENDOSCOPIC TATTOO (MISCELLANEOUS)
SYR INFLATION 60ML (SYRINGE) IMPLANT
TRAP ETRAP POLY (MISCELLANEOUS) IMPLANT
VARIJECT INJECTOR VIN23 (MISCELLANEOUS)
WATER STERILE IRR 250ML POUR (IV SOLUTION) ×2 IMPLANT
WIRE CRE 18-20MM 8CM F G (MISCELLANEOUS) IMPLANT

## 2017-08-29 NOTE — Anesthesia Procedure Notes (Signed)
Performed by: Rai Sinagra, CRNA Pre-anesthesia Checklist: Patient identified, Emergency Drugs available, Suction available, Timeout performed and Patient being monitored Patient Re-evaluated:Patient Re-evaluated prior to induction Oxygen Delivery Method: Nasal cannula Placement Confirmation: positive ETCO2       

## 2017-08-29 NOTE — Transfer of Care (Signed)
Immediate Anesthesia Transfer of Care Note  Patient: Yolanda Jimenez  Procedure(s) Performed: ESOPHAGOGASTRODUODENOSCOPY (EGD) WITH PROPOFOL (N/A )  Patient Location: PACU  Anesthesia Type: General  Level of Consciousness: awake, alert  and patient cooperative  Airway and Oxygen Therapy: Patient Spontanous Breathing and Patient connected to supplemental oxygen  Post-op Assessment: Post-op Vital signs reviewed, Patient's Cardiovascular Status Stable, Respiratory Function Stable, Patent Airway and No signs of Nausea or vomiting  Post-op Vital Signs: Reviewed and stable  Complications: No apparent anesthesia complications

## 2017-08-29 NOTE — Anesthesia Preprocedure Evaluation (Signed)
Anesthesia Evaluation  Patient identified by MRN, date of birth, ID band Patient awake    Reviewed: Allergy & Precautions, H&P , NPO status , Patient's Chart, lab work & pertinent test results  Airway Mallampati: II  TM Distance: >3 FB Neck ROM: full    Dental no notable dental hx.    Pulmonary Current Smoker,    Pulmonary exam normal        Cardiovascular negative cardio ROS Normal cardiovascular exam     Neuro/Psych    GI/Hepatic Neg liver ROS, Medicated,  Endo/Other  diabetes  Renal/GU Renal disease     Musculoskeletal   Abdominal   Peds  Hematology   Anesthesia Other Findings   Reproductive/Obstetrics                             Anesthesia Physical Anesthesia Plan  ASA: II  Anesthesia Plan: General   Post-op Pain Management:    Induction:   PONV Risk Score and Plan:   Airway Management Planned:   Additional Equipment:   Intra-op Plan:   Post-operative Plan:   Informed Consent: I have reviewed the patients History and Physical, chart, labs and discussed the procedure including the risks, benefits and alternatives for the proposed anesthesia with the patient or authorized representative who has indicated his/her understanding and acceptance.     Plan Discussed with:   Anesthesia Plan Comments:         Anesthesia Quick Evaluation

## 2017-08-29 NOTE — Anesthesia Postprocedure Evaluation (Signed)
Anesthesia Post Note  Patient: Yolanda Jimenez  Procedure(s) Performed: ESOPHAGOGASTRODUODENOSCOPY (EGD) WITH PROPOFOL (N/A )  Patient location during evaluation: PACU Anesthesia Type: General Level of consciousness: awake and alert Pain management: pain level controlled Vital Signs Assessment: post-procedure vital signs reviewed and stable Respiratory status: spontaneous breathing Cardiovascular status: blood pressure returned to baseline Postop Assessment: no headache Anesthetic complications: no    Jaci Standard, III,  Ayeisha Lindenberger D

## 2017-08-29 NOTE — Op Note (Signed)
[facility] Gastroenterology Patient Name: Yolanda Jimenez Procedure Date: 08/29/2017 7:40 AM MRN: [id number] Account #: [id number] Date of Birth: [birth year] Admit Type: Outpatient Age: 72 Room: [facility] OR ROOM 01 Gender: Female Note Status: Finalized Procedure:            Upper GI endoscopy Indications:          Surveillance for malignancy due to personal history of                        Barrett's esophagus Providers:            Lucilla Lame MD, MD Referring MD:         Rusty Aus, MD (Referring MD) Medicines:            Propofol per Anesthesia Complications:        No immediate complications. Procedure:            Pre-Anesthesia Assessment:                       - Prior to the procedure, a History and Physical was                        performed, and patient medications and allergies were                        reviewed. The patient's tolerance of previous                        anesthesia was also reviewed. The risks and benefits of                        the procedure and the sedation options and risks were                        discussed with the patient. All questions were                        answered, and informed consent was obtained. Prior                        Anticoagulants: The patient has taken no previous                        anticoagulant or antiplatelet agents. ASA Grade                        Assessment: II - A patient with mild systemic disease.                        After reviewing the risks and benefits, the patient was                        deemed in satisfactory condition to undergo the                        procedure.                       After obtaining informed consent, the endoscope was  passed under direct vision. Throughout the procedure,                        the patient's blood pressure, pulse, and oxygen                        saturations were monitored continuously. The Olympus    [id number]-HQ190 Endoscope (S#. [id number]) was introduced                        through the mouth, and advanced to the second part of                        duodenum. The upper GI endoscopy was accomplished                        without difficulty. The patient tolerated the procedure                        well. Findings:      A small hiatal hernia was present.      LA Grade A (one or more mucosal breaks less than 5 mm, not extending       between tops of 2 mucosal folds) esophagitis with no bleeding was found       at the gastroesophageal junction. Biopsies were taken with a cold       forceps for histology.      The stomach was normal.      The examined duodenum was normal. Impression:           - Small hiatal hernia.                       - LA Grade A esophagitis. Biopsied.                       - Normal stomach.                       - Normal examined duodenum. Recommendation:       - Discharge patient to home.                       - Resume previous diet.                       - Continue present medications.                       - Await pathology results. Procedure Code(s):    --- Professional ---                       [phone], Esophagogastroduodenoscopy, flexible, transoral;                        with biopsy, single or multiple Diagnosis Code(s):    --- Professional ---                       K22.70, Barrett's esophagus without dysplasia                       K20.9, Esophagitis, unspecified CPT copyright 2016 American Medical Association. All rights reserved.  The codes documented in this report are preliminary and upon coder review may  be revised to meet current compliance requirements. Lucilla Lame MD, MD 08/29/2017 8:05:47 AM This report has been signed electronically. Number of Addenda: 0 Note Initiated On: 08/29/2017 7:40 AM      [facility]

## 2017-08-29 NOTE — H&P (Signed)
Lucilla Lame, MD [address] [place], [place] [address] Phone:[phone] Fax : [phone]  Primary Care Physician:  Rusty Aus, MD Primary Gastroenterologist:  Dr. Allen Norris  Pre-Procedure History & Physical: HPI:  Yolanda Jimenez is a 72 y.o. female is here for an endoscopy.   Past Medical History:  Diagnosis Date  . AVM (arteriovenous malformation)   . Barrett esophagus   . C. difficile diarrhea   . Carotid stenosis   . Diabetes mellitus without complication ([place])   . Diverticulosis   . GERD (gastroesophageal reflux disease)   . Heart murmur   . Hx of adenomatous colonic polyps   . Hyperlipidemia   . IBS (irritable bowel syndrome)   . Insomnia   . Iron deficiency anemia   . Kidney cysts    hx UTIs  . Microscopic hematuria   . Osteoporosis    mild  . PVD (peripheral vascular disease) ([place])   . Wears dentures    partial lower    Past Surgical History:  Procedure Laterality Date  . BUNIONECTOMY     right foot  . CATARACT EXTRACTION     both eyes  . CERVICAL CONE BIOPSY    . CHOLECYSTECTOMY    . COLONOSCOPY    . FOOT SURGERY     right 2nd toe  . TUBAL LIGATION    . UPPER GASTROINTESTINAL ENDOSCOPY      Prior to Admission medications   Medication Sig Start Date End Date Taking? Authorizing Provider  Ascorbic Acid (VITAMIN C) 1000 MG tablet Take 1,000 mg by mouth daily.     Yes [provider]  Cholecalciferol (VITAMIN D) 1000 UNITS capsule Take 1,000 Units by mouth daily.     Yes [provider]  diazepam (VALIUM) 5 MG tablet Take 5 mg by mouth as needed.  02/03/15  Yes [provider]  hydrochlorothiazide (HYDRODIURIL) 12.5 MG tablet Take 12.5 mg by mouth daily as needed.   Yes [provider]  Loperamide HCl (IMODIUM PO) Take 1 tablet by mouth as needed.   Yes [provider]  magnesium oxide (MAG-OX) 400 MG tablet Take 400 mg by mouth daily.   Yes [provider]  Multiple  Vitamins-Minerals (PRESERVISION AREDS 2 PO) Take 1 tablet by mouth daily.   Yes [provider]  omeprazole (PRILOSEC) 20 MG capsule Take 2 tablets by mouth every morning (30 minutes before breakfast) and 1 tablet by mouth at bedtime. 07/03/13  Yes Lafayette Dragon, MD  simvastatin (ZOCOR) 40 MG tablet Take 40 mg by mouth at bedtime.     Yes [provider]  temazepam (RESTORIL) 30 MG capsule Take 30 mg by mouth at bedtime as needed.     Yes [provider]    Allergies as of 07/26/2017 - Review Complete 10/15/2016  Allergen Reaction Noted  . Codeine  08/23/2008  . Hydrocodone-acetaminophen Other (See Comments) 04/25/2015    Family History  Problem Relation Age of Onset  . Diabetes Mother   . Heart disease Mother   . Hypertension Mother   . Diabetes Son   . Diabetes Sister   . Inflammatory bowel disease Father        diverticulitis  . Multiple myeloma Father   . Hypertension Son   . Diabetes Maternal Grandmother   . Colon cancer Neg Hx   . Esophageal cancer Neg Hx   . Rectal cancer Neg Hx   . Stomach cancer Neg Hx  Social History   Socioeconomic History  . Marital status: Single    Spouse name: Not on file  . Number of children: 2  . Years of education: Not on file  . Highest education level: Not on file  Social Needs  . Financial resource strain: Not on file  . Food insecurity - worry: Not on file  . Food insecurity - inability: Not on file  . Transportation needs - medical: Not on file  . Transportation needs - non-medical: Not on file  Occupational History  . Occupation: Retired  Tobacco Use  . Smoking status: Current Every Day Smoker    Packs/day: 0.50    Years: 55.00    Pack years: 27.50    Types: Cigarettes  . Smokeless tobacco: Never Used  . Tobacco comment: she has patches to start just not started yet. smoked since teenager.  Substance and Sexual Activity  . Alcohol use: Yes    Alcohol/week: 1.2 oz    Types: 2 Glasses of  wine per week    Comment: socially  . Drug use: No  . Sexual activity: Not on file  Other Topics Concern  . Not on file  Social History Narrative  . Not on file    Review of Systems: See HPI, otherwise negative ROS  Physical Exam: BP (!) 144/76   Pulse (!) 56   Temp 97.7 F (36.5 C) (Temporal)   Resp 16   Ht _0  (1.575 m)   Wt 112 lb (50.8 kg)   SpO2 100%   BMI 20.49 kg/m  General:   Alert,  pleasant and cooperative in NAD Head:  Normocephalic and atraumatic. Neck:  Supple; no masses or thyromegaly. Lungs:  Clear throughout to auscultation.    Heart:  Regular rate and rhythm. Abdomen:  Soft, nontender and nondistended. Normal bowel sounds, without guarding, and without rebound.   Neurologic:  Alert and  oriented x4;  grossly normal neurologically.  Impression/Plan: Yolanda Jimenez is here for an endoscopy to be performed for Barrett's esophagus  Risks, benefits, limitations, and alternatives regarding  endoscopy have been reviewed with the patient.  Questions have been answered.  All parties agreeable.   Lucilla Lame, MD  08/29/2017, 7:47 AM

## 2017-09-06 ENCOUNTER — Encounter: Payer: Self-pay | Admitting: Gastroenterology

## 2017-09-07 ENCOUNTER — Encounter: Payer: Self-pay | Admitting: Gastroenterology

## 2017-09-09 DIAGNOSIS — R7989 Other specified abnormal findings of blood chemistry: Secondary | ICD-10-CM | POA: Insufficient documentation

## 2017-09-14 ENCOUNTER — Other Ambulatory Visit: Payer: Self-pay | Admitting: Internal Medicine

## 2017-09-14 DIAGNOSIS — R319 Hematuria, unspecified: Secondary | ICD-10-CM

## 2017-09-16 ENCOUNTER — Ambulatory Visit
Admission: RE | Admit: 2017-09-16 | Discharge: 2017-09-16 | Disposition: A | Discharge status: Home / Self Care | Payer: Medicare Other | Source: Ambulatory Visit | Attending: Internal Medicine | Admitting: Internal Medicine

## 2017-09-16 DIAGNOSIS — N281 Cyst of kidney, acquired: Secondary | ICD-10-CM | POA: Insufficient documentation

## 2017-09-16 DIAGNOSIS — R319 Hematuria, unspecified: Secondary | ICD-10-CM | POA: Diagnosis not present

## 2017-09-19 ENCOUNTER — Other Ambulatory Visit: Payer: Self-pay | Admitting: Internal Medicine

## 2017-09-19 DIAGNOSIS — Z1231 Encounter for screening mammogram for malignant neoplasm of breast: Secondary | ICD-10-CM

## 2017-10-18 ENCOUNTER — Ambulatory Visit (INDEPENDENT_AMBULATORY_CARE_PROVIDER_SITE_OTHER): Payer: Medicare Other

## 2017-10-18 ENCOUNTER — Encounter (INDEPENDENT_AMBULATORY_CARE_PROVIDER_SITE_OTHER): Payer: Self-pay | Admitting: Vascular Surgery

## 2017-10-18 ENCOUNTER — Ambulatory Visit (INDEPENDENT_AMBULATORY_CARE_PROVIDER_SITE_OTHER): Payer: Medicare Other | Admitting: Vascular Surgery

## 2017-10-18 VITALS — BP 115/73 | HR 61 | Resp 16 | Wt 109.4 lb

## 2017-10-18 DIAGNOSIS — I739 Peripheral vascular disease, unspecified: Secondary | ICD-10-CM | POA: Diagnosis not present

## 2017-10-18 DIAGNOSIS — E119 Type 2 diabetes mellitus without complications: Secondary | ICD-10-CM | POA: Diagnosis not present

## 2017-10-18 DIAGNOSIS — I6523 Occlusion and stenosis of bilateral carotid arteries: Secondary | ICD-10-CM

## 2017-10-18 DIAGNOSIS — E785 Hyperlipidemia, unspecified: Secondary | ICD-10-CM | POA: Diagnosis not present

## 2017-10-18 NOTE — Progress Notes (Signed)
MRN : [id number]  Yolanda Jimenez is a 72 y.o. (01-09-1946) female who presents with chief complaint of  Chief Complaint  Patient presents with  . Follow-up    11yrcarotid and abi  .  History of Present Illness: Patient returns in follow-up.  She continues to be bothered by pain and discoloration of her right second toe.  Most of the toes on her right foot has some discoloration, but only the second toe is painful.  This has been present for many years.  It sounds like she has had a couple of ulcers over the past year but none currently.  Her ABIs today remain normal bilaterally with brisk triphasic waveforms, but her digital pressures are markedly reduced with very blunted waveforms throughout all of the toes.  This is consistent with some degree of small vessel disease and likely some vasospasm/Raynaud's disease.  She is also studied with carotid duplex today which reveals stable, 1-39% carotid artery stenosis bilaterally.  Current Outpatient Prescriptions  Medication Sig Dispense Refill  . Ascorbic Acid (VITAMIN C) 1000 MG tablet Take 1,000 mg by mouth daily.      . calcium carbonate 200 MG capsule Take 250 mg by mouth daily.      . Cholecalciferol (VITAMIN D) 1000 UNITS capsule Take 1,000 Units by mouth daily.      . cholestyramine (QUESTRAN) 4 g packet Take 1 packet (4 g total) by mouth daily. 30 each 6  . diazepam (VALIUM) 5 MG tablet Take 5 mg by mouth as needed.     . Loperamide HCl (IMODIUM PO) Take 1 tablet by mouth as needed.    . magnesium oxide (MAG-OX) 400 MG tablet Take 400 mg by mouth daily.    . Multiple Vitamins-Minerals (PRESERVISION AREDS 2 PO) Take 1 tablet by mouth daily.    .Marland Kitchenomeprazole (PRILOSEC) 20 MG capsule Take 2 tablets by mouth every morning (30 minutes before breakfast) and 1 tablet by mouth at bedtime. 270 capsule 3  . simvastatin (ZOCOR) 40 MG tablet Take 40 mg by mouth at bedtime.      . temazepam (RESTORIL) 30 MG capsule Take 30 mg by mouth  at bedtime as needed.       No current facility-administered medications for this visit.         Past Medical History:  Diagnosis Date  . AVM (arteriovenous malformation)   . Barrett esophagus   . C. difficile diarrhea   . Carotid stenosis   . Diabetes mellitus without complication ([place]   . Diverticulosis   . GERD (gastroesophageal reflux disease)   . Heart murmur   . Hx of adenomatous colonic polyps   . Hyperlipidemia   . IBS (irritable bowel syndrome)   . Insomnia   . Iron deficiency anemia   . Kidney cysts    hx UTIs  . Microscopic hematuria   . Osteoporosis    mild  . PVD (peripheral vascular disease) ([facility]          Past Surgical History:  Procedure Laterality Date  . BUNIONECTOMY     right foot  . CATARACT EXTRACTION     both eyes  . CERVICAL CONE BIOPSY    . CHOLECYSTECTOMY    . COLONOSCOPY    . FOOT SURGERY     right 2nd toe  . TUBAL LIGATION    . UPPER GASTROINTESTINAL ENDOSCOPY      Social History        Social History  Substance  Use Topics  . Smoking status: Current Every Day Smoker    Packs/day: 0.50    Types: Cigarettes  . Smokeless tobacco: Never Used     Comment: she has patches to start just not started yet.   . Alcohol use Yes      Comment: socially    Family History       Family History  Problem Relation Age of Onset  . Colon cancer Neg Hx   . Esophageal cancer Neg Hx   . Rectal cancer Neg Hx   . Stomach cancer Neg Hx   . Diabetes Mother   . Heart disease Mother   . Hypertension Mother   . Diabetes Son   . Diabetes Sister   . Inflammatory bowel disease Father     diverticulitis  . Multiple myeloma Father   . Hypertension Son   . Diabetes Maternal Grandmother          Allergies  Allergen Reactions  . Codeine     "felt drunk, crazy"  . Hydrocodone-Acetaminophen Other (See Comments)    Confusion/delirium     REVIEW OF SYSTEMS  (Negative unless checked)  Constitutional: [] Weight loss  [] Fever  [] Chills Cardiac: [] Chest pain   [] Chest pressure   [] Palpitations   [] Shortness of breath when laying flat   [] Shortness of breath at rest   [] Shortness of breath with exertion. Vascular:  [] Pain in legs with walking   [] Pain in legs at rest   [] Pain in legs when laying flat   [] Claudication   [] Pain in feet when walking  [x] Pain in feet at rest  [] Pain in feet when laying flat   [] History of DVT   [] Phlebitis   [] Swelling in legs   [] Varicose veins   [] Non-healing ulcers Pulmonary:   [] Uses home oxygen   [] Productive cough   [] Hemoptysis   [] Wheeze  [] COPD   [] Asthma Neurologic:  [] Dizziness  [] Blackouts   [] Seizures   [] History of stroke   [] History of TIA  [] Aphasia   [] Temporary blindness   [] Dysphagia   [] Weakness or numbness in arms   [] Weakness or numbness in legs Musculoskeletal:  [] Arthritis   [] Joint swelling   [] Joint pain   [] Low back pain Hematologic:  [] Easy bruising  [] Easy bleeding   [] Hypercoagulable state   [] Anemic   Gastrointestinal:  [] Blood in stool   [] Vomiting blood  [] Gastroesophageal reflux/heartburn   [] Abdominal pain Genitourinary:  [] Chronic kidney disease   [] Difficult urination  [] Frequent urination  [] Burning with urination   [] Hematuria Skin:  [] Rashes   [x] Ulcers   [x] Wounds Psychological:  [] History of anxiety   []  History of major depression.      Physical Examination  Vitals:   10/18/17 1031  BP: 115/73  Pulse: 61  Resp: 16  Weight: 49.6 kg (109 lb 6.4 oz)   Body mass index is 20.01 kg/m. Gen:  WD/WN, NAD. Appears younger than stated age. Head: [place]/AT, No temporalis wasting. Ear/Nose/Throat: Hearing grossly intact, nares w/o erythema or drainage, trachea midline Eyes: Conjunctiva clear. Sclera non-icteric Neck: Supple.  No bruit.  Pulmonary:  Good air movement, respirations not labored, no use of accessory muscles Cardiac: RRR, no JVD Vascular:  Vessel Right Left  Radial  Palpable Palpable                          PT Palpable Palpable  DP Palpable Palpable   Gastrointestinal: soft, non-tender/non-distended. No guarding/reflex.  Musculoskeletal: M/S 5/5 throughout.  No  deformity or atrophy. No edema.  Blue to purple discoloration on the right more than left of all 5 toes, although the second toe is the most severe.  No open ulcerations. Neurologic: CN 2-12 intact. Sensation grossly intact in extremities.  Symmetrical.  Speech is fluent. Motor exam as listed above. Psychiatric: Judgment intact, Mood & affect appropriate for pt's clinical situation. Dermatologic: No rashes or ulcers noted.  No cellulitis or open wounds.      CBC Lab Results  Component Value Date   WBC 7.3 06/12/2015   HGB 12.4 06/12/2015   HCT 37.7 06/12/2015   MCV 90.6 06/12/2015   PLT 373 06/12/2015    BMET    Component Value Date/Time   NA 138 06/12/2015 1441   NA 142 07/12/2014 2035   K 3.9 06/12/2015 1441   K 3.4 (L) 07/12/2014 2035   CL 107 06/12/2015 1441   CL 107 07/12/2014 2035   CO2 26 06/12/2015 1441   CO2 30 07/12/2014 2035   GLUCOSE 121 (H) 06/12/2015 1441   GLUCOSE 133 (H) 07/12/2014 2035   BUN 15 06/12/2015 1441   BUN 11 07/12/2014 2035   CREATININE 0.59 06/12/2015 1441   CREATININE 0.73 07/12/2014 2035   CALCIUM 8.8 (L) 06/12/2015 1441   CALCIUM 8.3 (L) 07/12/2014 2035   GFRNONAA >60 06/12/2015 1441   GFRNONAA >60 07/12/2014 2035   GFRAA >60 06/12/2015 1441   GFRAA >60 07/12/2014 2035   CrCl cannot be calculated (Patient's most recent lab result is older than the maximum 21 days allowed.).  COAG No results found for: INR, PROTIME  Radiology No results found.    Assessment/Plan  Diabetes mellitus, type 2 blood glucose control important in reducing the progression of atherosclerotic disease. Also, involved in wound healing. On appropriate medications.   Hyperlipidemia lipid control important in reducing the progression of  atherosclerotic disease. Continue statin therapy   Carotid stenosis Mild, 1-39% stenosis bilaterally.  No progression. Recheck in 1 year  PAD (peripheral artery disease) (HCC) Her ABIs today remain normal bilaterally with brisk triphasic waveforms, but her digital pressures are markedly reduced with very blunted waveforms throughout all of the toes.  This is consistent with some degree of small vessel disease and likely some vasospasm/Raynaud's disease.  No intervention recommended currently.  Would be very cautious with any procedures on her toes.  Recheck in 1 year.    Leotis Pain, MD  10/18/2017 2:16 PM    This note was created with Dragon medical transcription system.  Any errors from dictation are purely unintentional

## 2017-10-18 NOTE — Assessment & Plan Note (Signed)
Mild, 1-39% stenosis bilaterally.  No progression. Recheck in 1 year

## 2017-10-18 NOTE — Assessment & Plan Note (Signed)
Her ABIs today remain normal bilaterally with brisk triphasic waveforms, but her digital pressures are markedly reduced with very blunted waveforms throughout all of the toes.  This is consistent with some degree of small vessel disease and likely some vasospasm/Raynaud's disease.  No intervention recommended currently.  Would be very cautious with any procedures on her toes.  Recheck in 1 year.

## 2017-10-27 ENCOUNTER — Ambulatory Visit
Admission: RE | Admit: 2017-10-27 | Discharge: 2017-10-27 | Disposition: A | Discharge status: Home / Self Care | Payer: Medicare Other | Source: Ambulatory Visit | Attending: Internal Medicine | Admitting: Internal Medicine

## 2017-10-27 DIAGNOSIS — Z1231 Encounter for screening mammogram for malignant neoplasm of breast: Secondary | ICD-10-CM | POA: Diagnosis not present

## 2018-03-08 DIAGNOSIS — E782 Mixed hyperlipidemia: Secondary | ICD-10-CM

## 2018-03-08 DIAGNOSIS — E1169 Type 2 diabetes mellitus with other specified complication: Secondary | ICD-10-CM | POA: Insufficient documentation

## 2018-03-22 ENCOUNTER — Encounter: Payer: Self-pay | Admitting: Urology

## 2018-03-22 ENCOUNTER — Ambulatory Visit: Payer: Medicare Other | Admitting: Urology

## 2018-03-22 VITALS — BP 132/86 | HR 69 | Ht 62.0 in | Wt 107.0 lb

## 2018-03-22 DIAGNOSIS — R3129 Other microscopic hematuria: Secondary | ICD-10-CM

## 2018-03-22 LAB — URINALYSIS, COMPLETE
Bilirubin, UA: NEGATIVE
Glucose, UA: NEGATIVE
Ketones, UA: NEGATIVE
Nitrite, UA: NEGATIVE
Protein, UA: NEGATIVE
Specific Gravity, UA: 1.02 (ref 1.005–1.030)
Urobilinogen, Ur: 0.2 mg/dL (ref 0.2–1.0)
pH, UA: 5.5 (ref 5.0–7.5)

## 2018-03-22 LAB — MICROSCOPIC EXAMINATION

## 2018-03-22 NOTE — Progress Notes (Signed)
03/22/2018 12:07 PM   Yolanda Jimenez 22-Jul-1946 [id number]  Referring provider: Rusty Aus, MD [facility] [place], [place] [address]  Chief Complaint  Patient presents with  . Hematuria    New Patient    HPI: 72 year old female presents to establish local urologic care.  She has been followed by Dr. Jacqlyn Larsen for more than 10 years for microscopic hematuria.  She had a negative evaluation and her minimal microhematuria has been stable.  She last saw Dr. Jacqlyn Larsen in February 2018.  She has stable lower urinary tract symptoms.  She does have urinary frequency and variable nocturia ranging from 0 to every 1 hour.  She denies dysuria or gross hematuria.  Denies flank, abdominal or pelvic pain.  She did have an abdominal ultrasound ordered by Dr. Sabra Heck in February 2019 which showed a stable 2.9 cm left simple renal cyst.  Her PVR was 35 mL by ultrasound.   PMH: Past Medical History:  Diagnosis Date  . AVM (arteriovenous malformation)   . Barrett esophagus   . C. difficile diarrhea   . Carotid stenosis   . Diabetes mellitus without complication ([place])   . Diverticulosis   . GERD (gastroesophageal reflux disease)   . Heart murmur   . Hx of adenomatous colonic polyps   . Hyperlipidemia   . IBS (irritable bowel syndrome)   . Insomnia   . Iron deficiency anemia   . Kidney cysts    hx UTIs  . Microscopic hematuria   . Osteoporosis    mild  . PVD (peripheral vascular disease) ([place])   . Wears dentures    partial lower    Surgical History: Past Surgical History:  Procedure Laterality Date  . BUNIONECTOMY     right foot  . CATARACT EXTRACTION     both eyes  . CERVICAL CONE BIOPSY    . CHOLECYSTECTOMY    . COLONOSCOPY    . ESOPHAGOGASTRODUODENOSCOPY (EGD) WITH PROPOFOL N/A 08/29/2017   Procedure: ESOPHAGOGASTRODUODENOSCOPY (EGD) WITH PROPOFOL;  Surgeon: Lucilla Lame, MD;  Location: [facility];  Service: Endoscopy;  Laterality:  N/A;  . FOOT SURGERY     right 2nd toe  . TUBAL LIGATION    . UPPER GASTROINTESTINAL ENDOSCOPY      Home Medications:  Allergies as of 03/22/2018      Reactions   Codeine    "felt drunk, crazy"   Hydrocodone-acetaminophen Other (See Comments)   Confusion/delirium      Medication List        Accurate as of 03/22/18 12:07 PM. Always use your most recent med list.          ferrous gluconate 240 (27 FE) MG tablet Commonly known as:  FERGON Take by mouth.   hydrochlorothiazide 12.5 MG tablet Commonly known as:  HYDRODIURIL Take 12.5 mg by mouth daily as needed.   IMODIUM PO Take 1 tablet by mouth as needed.   magnesium oxide 400 MG tablet Commonly known as:  MAG-OX Take 400 mg by mouth daily.   omeprazole 20 MG capsule Commonly known as:  PRILOSEC Take 2 tablets by mouth every morning (30 minutes before breakfast) and 1 tablet by mouth at bedtime.   PRESERVISION AREDS 2 PO Take 1 tablet by mouth daily.   simvastatin 40 MG tablet Commonly known as:  ZOCOR Take 40 mg by mouth at bedtime.   vitamin C 1000 MG tablet Take 1,000 mg by mouth daily.   Vitamin D 1000  units capsule Take 1,000 Units by mouth daily.       Allergies:  Allergies  Allergen Reactions  . Codeine     "felt drunk, crazy"  . Hydrocodone-Acetaminophen Other (See Comments)    Confusion/delirium    Family History: Family History  Problem Relation Age of Onset  . Diabetes Mother   . Heart disease Mother   . Hypertension Mother   . Diabetes Son   . Diabetes Sister   . Inflammatory bowel disease Father        diverticulitis  . Multiple myeloma Father   . Hypertension Son   . Diabetes Maternal Grandmother   . Colon cancer Neg Hx   . Esophageal cancer Neg Hx   . Rectal cancer Neg Hx   . Stomach cancer Neg Hx   . Breast cancer Neg Hx     Social History:  reports that she has been smoking cigarettes. She has a 27.50 pack-year smoking history. She has never used smokeless tobacco.  She reports that she drinks about 2.0 standard drinks of alcohol per week. She reports that she does not use drugs.  ROS: UROLOGY Frequent Urination?: Yes Hard to postpone urination?: No Burning/pain with urination?: No Get up at night to urinate?: Yes Leakage of urine?: No Urine stream starts and stops?: No Trouble starting stream?: No Do you have to strain to urinate?: No Blood in urine?: Yes Urinary tract infection?: No Sexually transmitted disease?: No Injury to kidneys or bladder?: No Painful intercourse?: No Weak stream?: No Currently pregnant?: No Vaginal bleeding?: No Last menstrual period?: n  Gastrointestinal Nausea?: No Vomiting?: No Indigestion/heartburn?: Yes Diarrhea?: Yes Constipation?: No  Constitutional Fever: No Night sweats?: No Weight loss?: No Fatigue?: Yes  Skin Skin rash/lesions?: No Itching?: No  Eyes Blurred vision?: No Double vision?: No  Ears/Nose/Throat Sore throat?: No Sinus problems?: No  Hematologic/Lymphatic Swollen glands?: No Easy bruising?: No  Cardiovascular Leg swelling?: No Chest pain?: No  Respiratory Cough?: No Shortness of breath?: No  Endocrine Excessive thirst?: No  Musculoskeletal Back pain?: No Joint pain?: No  Neurological Headaches?: No Dizziness?: No  Psychologic Depression?: No Anxiety?: No  Physical Exam: BP 132/86   Pulse 69   Ht 5' 2"  (1.575 m)   Wt 107 lb (48.5 kg)   BMI 19.57 kg/m   Constitutional:  Alert and oriented, No acute distress. HEENT: [place] AT, moist mucus membranes.  Trachea midline, no masses. Cardiovascular: No clubbing, cyanosis, or edema. Respiratory: Normal respiratory effort, no increased work of breathing. GI: Abdomen is soft, nontender, nondistended, no abdominal masses GU: No CVA tenderness Lymph: No cervical or inguinal lymphadenopathy. Skin: No rashes, bruises or suspicious lesions. Neurologic: Grossly intact, no focal deficits, moving all 4  extremities. Psychiatric: Normal mood and affect.  Laboratory Data:  Urinalysis Dipstick 2+ blood/1+ leukocytes Microscopy 3-10 RBC   Assessment & Plan:   72 year old female with stable microhematuria and a prior negative evaluation.  Recent abdominal ultrasound was unremarkable.  Recommend he continue annual follow-up and to return earlier for any noted changes such as gross hematuria.    Return in about 1 year (around 03/23/2019) for Recheck.  Abbie Sons, [place] [address], [place], [place] [address] [phone]

## 2018-03-26 ENCOUNTER — Encounter: Payer: Self-pay | Admitting: Urology

## 2018-04-04 DIAGNOSIS — Z720 Tobacco use: Secondary | ICD-10-CM | POA: Insufficient documentation

## 2018-05-15 ENCOUNTER — Inpatient Hospital Stay
Admission: EM | Admit: 2018-05-15 | Discharge: 2018-05-18 | DRG: 470 | Disposition: A | Discharge status: Skilled Nursing Facility | Payer: Medicare Other | Attending: Internal Medicine | Admitting: Internal Medicine

## 2018-05-15 ENCOUNTER — Other Ambulatory Visit: Payer: Self-pay

## 2018-05-15 ENCOUNTER — Encounter: Payer: Self-pay | Admitting: Emergency Medicine

## 2018-05-15 ENCOUNTER — Emergency Department: Payer: Medicare Other

## 2018-05-15 DIAGNOSIS — M810 Age-related osteoporosis without current pathological fracture: Secondary | ICD-10-CM | POA: Diagnosis present

## 2018-05-15 DIAGNOSIS — Z888 Allergy status to other drugs, medicaments and biological substances status: Secondary | ICD-10-CM

## 2018-05-15 DIAGNOSIS — M79672 Pain in left foot: Secondary | ICD-10-CM | POA: Diagnosis present

## 2018-05-15 DIAGNOSIS — S72009A Fracture of unspecified part of neck of unspecified femur, initial encounter for closed fracture: Secondary | ICD-10-CM | POA: Diagnosis present

## 2018-05-15 DIAGNOSIS — K219 Gastro-esophageal reflux disease without esophagitis: Secondary | ICD-10-CM | POA: Diagnosis present

## 2018-05-15 DIAGNOSIS — S72002A Fracture of unspecified part of neck of left femur, initial encounter for closed fracture: Secondary | ICD-10-CM | POA: Diagnosis present

## 2018-05-15 DIAGNOSIS — F17210 Nicotine dependence, cigarettes, uncomplicated: Secondary | ICD-10-CM | POA: Diagnosis present

## 2018-05-15 DIAGNOSIS — E1151 Type 2 diabetes mellitus with diabetic peripheral angiopathy without gangrene: Secondary | ICD-10-CM | POA: Diagnosis present

## 2018-05-15 DIAGNOSIS — K580 Irritable bowel syndrome with diarrhea: Secondary | ICD-10-CM | POA: Diagnosis present

## 2018-05-15 DIAGNOSIS — Z885 Allergy status to narcotic agent status: Secondary | ICD-10-CM | POA: Diagnosis not present

## 2018-05-15 DIAGNOSIS — Z96649 Presence of unspecified artificial hip joint: Secondary | ICD-10-CM

## 2018-05-15 DIAGNOSIS — M25559 Pain in unspecified hip: Secondary | ICD-10-CM

## 2018-05-15 DIAGNOSIS — M79673 Pain in unspecified foot: Secondary | ICD-10-CM

## 2018-05-15 DIAGNOSIS — Z79899 Other long term (current) drug therapy: Secondary | ICD-10-CM | POA: Diagnosis not present

## 2018-05-15 DIAGNOSIS — E785 Hyperlipidemia, unspecified: Secondary | ICD-10-CM | POA: Diagnosis present

## 2018-05-15 DIAGNOSIS — W010XXA Fall on same level from slipping, tripping and stumbling without subsequent striking against object, initial encounter: Secondary | ICD-10-CM | POA: Diagnosis present

## 2018-05-15 DIAGNOSIS — W19XXXA Unspecified fall, initial encounter: Secondary | ICD-10-CM

## 2018-05-15 DIAGNOSIS — Y92512 Supermarket, store or market as the place of occurrence of the external cause: Secondary | ICD-10-CM

## 2018-05-15 LAB — CBC WITH DIFFERENTIAL/PLATELET
Basophils Absolute: 0.1 10*3/uL (ref 0–0.1)
Basophils Relative: 1 %
Eosinophils Absolute: 0.1 10*3/uL (ref 0–0.7)
Eosinophils Relative: 1 %
HCT: 45.2 % (ref 35.0–47.0)
Hemoglobin: 15.3 g/dL (ref 12.0–16.0)
Lymphocytes Relative: 5 %
Lymphs Abs: 0.7 10*3/uL — ABNORMAL LOW (ref 1.0–3.6)
MCH: 32.9 pg (ref 26.0–34.0)
MCHC: 33.8 g/dL (ref 32.0–36.0)
MCV: 97.2 fL (ref 80.0–100.0)
Monocytes Absolute: 0.7 10*3/uL (ref 0.2–0.9)
Monocytes Relative: 5 %
Neutro Abs: 13.5 10*3/uL — ABNORMAL HIGH (ref 1.4–6.5)
Neutrophils Relative %: 88 %
Platelets: 277 10*3/uL (ref 150–440)
RBC: 4.65 MIL/uL (ref 3.80–5.20)
RDW: 14.2 % (ref 11.5–14.5)
WBC: 15.1 10*3/uL — ABNORMAL HIGH (ref 3.6–11.0)

## 2018-05-15 LAB — COMPREHENSIVE METABOLIC PANEL
ALT: 16 U/L (ref 0–44)
AST: 25 U/L (ref 15–41)
Albumin: 4.2 g/dL (ref 3.5–5.0)
Alkaline Phosphatase: 62 U/L (ref 38–126)
Anion gap: 9 (ref 5–15)
BUN: 11 mg/dL (ref 8–23)
CO2: 24 mmol/L (ref 22–32)
Calcium: 9.1 mg/dL (ref 8.9–10.3)
Chloride: 105 mmol/L (ref 98–111)
Creatinine, Ser: 0.57 mg/dL (ref 0.44–1.00)
GFR calc Af Amer: >60 mL/min (ref >60)
GFR calc non Af Amer: >60 mL/min (ref >60)
Glucose, Bld: 121 mg/dL — ABNORMAL HIGH (ref 70–99)
Potassium: 4.3 mmol/L (ref 3.5–5.1)
Sodium: 138 mmol/L (ref 135–145)
Total Bilirubin: 0.6 mg/dL (ref 0.3–1.2)
Total Protein: 7.1 g/dL (ref 6.5–8.1)

## 2018-05-15 MED ORDER — OCUVITE-LUTEIN PO CAPS
1.0000 | ORAL_CAPSULE | Freq: Every day | ORAL | Status: DC
  Administered 2018-05-15 – 2018-05-18 (×3): 1 via ORAL
  Filled 2018-05-15 (×4): qty 1

## 2018-05-15 MED ORDER — CEFAZOLIN SODIUM-DEXTROSE 2-4 GM/100ML-% IV SOLN
2.0000 g | INTRAVENOUS | Status: AC
  Administered 2018-05-16: 2 g via INTRAVENOUS
  Filled 2018-05-15: qty 100

## 2018-05-15 MED ORDER — PANTOPRAZOLE SODIUM 40 MG PO TBEC
40.0000 mg | DELAYED_RELEASE_TABLET | Freq: Two times a day (BID) | ORAL | Status: DC
  Administered 2018-05-17 – 2018-05-18 (×3): 40 mg via ORAL
  Filled 2018-05-15 (×3): qty 1

## 2018-05-15 MED ORDER — SIMVASTATIN 20 MG PO TABS
40.0000 mg | ORAL_TABLET | Freq: Every day | ORAL | Status: DC
  Administered 2018-05-15 – 2018-05-17 (×3): 40 mg via ORAL
  Filled 2018-05-15 (×3): qty 2

## 2018-05-15 MED ORDER — POLYETHYLENE GLYCOL 3350 17 G PO PACK: 17.0000 g | PACK | Freq: Every day | ORAL | Status: DC | PRN

## 2018-05-15 MED ORDER — TRAMADOL HCL 50 MG PO TABS
50.0000 mg | ORAL_TABLET | Freq: Four times a day (QID) | ORAL | Status: DC | PRN
  Administered 2018-05-15 – 2018-05-16 (×2): 50 mg via ORAL
  Filled 2018-05-15 (×2): qty 1

## 2018-05-15 MED ORDER — SODIUM CHLORIDE 0.9 % IV SOLN
INTRAVENOUS | Status: AC
  Administered 2018-05-15: 19:00:00 via INTRAVENOUS

## 2018-05-15 MED ORDER — MAGNESIUM OXIDE 400 (241.3 MG) MG PO TABS
400.0000 mg | ORAL_TABLET | Freq: Every day | ORAL | Status: DC
  Administered 2018-05-17 – 2018-05-18 (×2): 400 mg via ORAL
  Filled 2018-05-15 (×2): qty 1

## 2018-05-15 MED ORDER — VITAMIN C 500 MG PO TABS
1000.0000 mg | ORAL_TABLET | Freq: Every day | ORAL | Status: DC
  Administered 2018-05-17 – 2018-05-18 (×2): 1000 mg via ORAL
  Filled 2018-05-15 (×2): qty 2

## 2018-05-15 MED ORDER — MORPHINE SULFATE (PF) 2 MG/ML IV SOLN: 2.0000 mg | INTRAVENOUS | Status: DC | PRN

## 2018-05-15 MED ORDER — ONDANSETRON HCL 4 MG PO TABS: 4.0000 mg | ORAL_TABLET | Freq: Four times a day (QID) | ORAL | Status: DC | PRN

## 2018-05-15 MED ORDER — FERROUS GLUCONATE 324 (38 FE) MG PO TABS
324.0000 mg | ORAL_TABLET | Freq: Two times a day (BID) | ORAL | Status: DC
  Administered 2018-05-15 – 2018-05-18 (×3): 324 mg via ORAL
  Filled 2018-05-15 (×7): qty 1

## 2018-05-15 MED ORDER — DOCUSATE SODIUM 100 MG PO CAPS
100.0000 mg | ORAL_CAPSULE | Freq: Two times a day (BID) | ORAL | Status: DC
  Administered 2018-05-15: 100 mg via ORAL
  Filled 2018-05-15: qty 1

## 2018-05-15 MED ORDER — FENTANYL CITRATE (PF) 100 MCG/2ML IJ SOLN
25.0000 ug | Freq: Once | INTRAMUSCULAR | Status: AC
  Administered 2018-05-15: 25 ug via INTRAVENOUS
  Filled 2018-05-15: qty 2

## 2018-05-15 MED ORDER — ACETAMINOPHEN 650 MG RE SUPP: 650.0000 mg | Freq: Four times a day (QID) | RECTAL | Status: DC | PRN

## 2018-05-15 MED ORDER — VITAMIN D 1000 UNITS PO TABS
1000.0000 [IU] | ORAL_TABLET | Freq: Every day | ORAL | Status: DC
  Administered 2018-05-17 – 2018-05-18 (×2): 1000 [IU] via ORAL
  Filled 2018-05-15 (×2): qty 1

## 2018-05-15 MED ORDER — ACETAMINOPHEN 325 MG PO TABS: 650.0000 mg | ORAL_TABLET | Freq: Four times a day (QID) | ORAL | Status: DC | PRN

## 2018-05-15 MED ORDER — ONDANSETRON HCL 4 MG/2ML IJ SOLN
4.0000 mg | Freq: Four times a day (QID) | INTRAMUSCULAR | Status: DC | PRN
  Filled 2018-05-15: qty 2

## 2018-05-15 MED ORDER — TEMAZEPAM 15 MG PO CAPS
15.0000 mg | ORAL_CAPSULE | Freq: Every evening | ORAL | Status: DC | PRN
  Administered 2018-05-15: 15 mg via ORAL
  Filled 2018-05-15: qty 1

## 2018-05-15 NOTE — ED Provider Notes (Signed)
[facility] Emergency Department Provider Note  ___________________________________________   First MD Initiated Contact with Patient 05/15/18 1319     (approximate)  I have reviewed the triage vital signs and the nursing notes.   HISTORY  Chief Complaint Fall  HPI Yolanda Jimenez is a 72 y.o. female presents to the ED via EMS with injury to her left side after she tripped and fell over a plastic object that was sticking out in the aisle.  Patient states that she landed on her left shoulder and has pain to her left ankle, left knee, and left hip.  She denies headache or loss of consciousness.  She states that she was aware of what was going on the entire time.  She denies any visual changes, nausea, vomiting, chest pain or shortness of breath.  Currently she rates her pain as 9 out of 10.  Past Medical History:  Diagnosis Date  . AVM (arteriovenous malformation)   . Barrett esophagus   . C. difficile diarrhea   . Carotid stenosis   . Diabetes mellitus without complication ([place])   . Diverticulosis   . GERD (gastroesophageal reflux disease)   . Heart murmur   . Hx of adenomatous colonic polyps   . Hyperlipidemia   . IBS (irritable bowel syndrome)   . Insomnia   . Iron deficiency anemia   . Kidney cysts    hx UTIs  . Microscopic hematuria   . Osteoporosis    mild  . PVD (peripheral vascular disease) ([place])   . Wears dentures    partial lower    Patient Active Problem List   Diagnosis Date Noted  . Hip fracture ([place]) 05/15/2018  . DM type 2 with diabetic mixed hyperlipidemia ([place]) 03/08/2018  . Low serum vitamin D 09/09/2017  . History of Barrett's esophagus   . Esophagitis, unspecified   . PAD (peripheral artery disease) ([place]) 10/15/2016  . Carotid stenosis 10/15/2016  . Pain in toe 09/17/2016  . Renal cyst, acquired, left 09/13/2016  . Diabetes mellitus, type 2 ([place]) 08/29/2014  . Combined fat and carbohydrate induced hyperlipemia  08/29/2014  . Incomplete emptying of bladder 07/24/2014  . Increased frequency of urination 03/19/2014  . Lower abdominal pain 03/02/2014  . Microscopic hematuria 03/01/2014  . Nocturia 03/01/2014  . Urinary tract infection, site not specified 03/01/2014  . Diarrhea 03/05/2013  . COLONIC POLYPS, ADENOMATOUS 09/05/2007  . Hyperlipidemia 09/05/2007  . IRON DEFICIENCY 09/05/2007  . BARRETTS ESOPHAGUS 09/05/2007  . DIVERTICULOSIS, COLON 09/05/2007  . IRRITABLE BOWEL SYNDROME 09/05/2007  . ARTERIOVENOUS MALFORMATION 09/05/2007    Past Surgical History:  Procedure Laterality Date  . BUNIONECTOMY     right foot  . CATARACT EXTRACTION     both eyes  . CERVICAL CONE BIOPSY    . CHOLECYSTECTOMY    . COLONOSCOPY    . ESOPHAGOGASTRODUODENOSCOPY (EGD) WITH PROPOFOL N/A 08/29/2017   Procedure: ESOPHAGOGASTRODUODENOSCOPY (EGD) WITH PROPOFOL;  Surgeon: Lucilla Lame, MD;  Location: [place];  Service: Endoscopy;  Laterality: N/A;  . FOOT SURGERY     right 2nd toe  . TUBAL LIGATION    . UPPER GASTROINTESTINAL ENDOSCOPY      Prior to Admission medications   Medication Sig Start Date End Date Taking? Authorizing Provider  Ascorbic Acid (VITAMIN C) 1000 MG tablet Take 1,000 mg by mouth daily.     Yes [provider]  Cholecalciferol (VITAMIN D) 1000 UNITS capsule Take 1,000 Units by mouth daily.  Yes [provider]  diazepam (VALIUM) 5 MG tablet Take 5 mg by mouth daily as needed for anxiety.  05/10/18  Yes [provider]  ferrous gluconate (FERGON) 240 (27 FE) MG tablet Take 240 mg by mouth daily.  09/09/17  Yes [provider]  loperamide (IMODIUM) 2 MG capsule Take 2 mg by mouth as needed for diarrhea or loose stools.   Yes [provider]  magnesium oxide (MAG-OX) 400 MG tablet Take 400 mg by mouth daily.   Yes [provider]  Multiple Vitamins-Minerals (PRESERVISION AREDS 2 PO) Take 1 tablet by mouth daily.   Yes [provider]  omeprazole (PRILOSEC) 20 MG capsule Take 2 tablets by mouth every morning (30 minutes before breakfast) and 1 tablet by mouth at bedtime. 07/03/13  Yes Lafayette Dragon, MD  simvastatin (ZOCOR) 40 MG tablet Take 40 mg by mouth at bedtime.     Yes [provider]  temazepam (RESTORIL) 15 MG capsule Take 15 mg by mouth at bedtime as needed for sleep. 05/10/18  Yes [provider]    Allergies Codeine; Hydrocodone-acetaminophen; and Nsaids  Family History  Problem Relation Age of Onset  . Diabetes Mother   . Heart disease Mother   . Hypertension Mother   . Diabetes Son   . Diabetes Sister   . Inflammatory bowel disease Father        diverticulitis  . Multiple myeloma Father   . Hypertension Son   . Diabetes Maternal Grandmother   . Colon cancer Neg Hx   . Esophageal cancer Neg Hx   . Rectal cancer Neg Hx   . Stomach cancer Neg Hx   . Breast cancer Neg Hx     Social History Social History   Tobacco Use  . Smoking status: Current Every Day Smoker    Packs/day: 0.50    Years: 55.00    Pack years: 27.50    Types: Cigarettes  . Smokeless tobacco: Never Used  . Tobacco comment: she has patches to start just not started yet. smoked since teenager.  Substance Use Topics  . Alcohol use: Yes    Alcohol/week: 2.0 standard drinks    Types: 2 Glasses of wine per week    Comment: socially  . Drug use: No    Review of Systems Constitutional: No fever/chills Eyes: No visual changes. ENT: No trauma. Cardiovascular: Denies chest pain. Respiratory: Denies shortness of breath. Gastrointestinal: No abdominal pain.  No nausea, no vomiting. Genitourinary: Negative for dysuria. Musculoskeletal: Positive left shoulder, left hip, left knee, left ankle pain. Skin: Negative for injury. Neurological: Negative for headaches, focal weakness or numbness. ____________________________________________   PHYSICAL EXAM:  VITAL SIGNS: ED Triage Vitals  Enc  Vitals Group     BP 05/15/18 1205 (!) 166/100     Pulse Rate 05/15/18 1205 (!) 54     Resp 05/15/18 1205 18     Temp 05/15/18 1205 98 F (36.7 C)     Temp Source 05/15/18 1205 Oral     SpO2 05/15/18 1205 98 %     Weight 05/15/18 1206 105 lb (47.6 kg)     Height 05/15/18 1206 _0  (1.575 m)     Head Circumference --      Peak Flow --      Pain Score 05/15/18 1206 9     Pain Loc --      Pain Edu? --      Excl. in [place]? --  Constitutional: Alert and oriented. Well appearing and in no acute distress.  Currently patient is sitting in a wheelchair with her leg elevated on the stretcher.  Patient states that she did not want to move from the stretcher and preferred to sit in the wheelchair.  Patient is pleasant, cooperative and answering questions appropriately. Eyes: Conjunctivae are normal. PERRL. EOMI. Head: Atraumatic. Nose: No trauma. Neck: No stridor.  Nontender cervical spine to palpation posteriorly.  Range of motion is that restriction. Cardiovascular: Normal rate, regular rhythm. Grossly normal heart sounds.  Good peripheral circulation. Respiratory: Normal respiratory effort.  No retractions. Lungs CTAB. Gastrointestinal: Soft and nontender. No distention.  Bowel sounds are normoactive x4 quadrants. Musculoskeletal: Nontender thoracic or lumbar spine to palpation posteriorly.  Patient is able to move left shoulder without any restriction and no crepitus is appreciated.  There is no soft tissue edema or restricted range of motion with exam of the left elbow and wrist.  Patient is able to move all digits without any difficulty.  Examination of the left lower extremity rotation and shortening is not appreciated with patient sitting in a wheelchair.  There is moderate tenderness to palpation of the left lateral hip and mid femur.  No edema is present to the left knee or ankle.  Patient is able to move her lower extremity and flex and extend her knee without any difficulty.  There is  higher level of suspicion for injury to the left hip on exam. Neurologic:  Normal speech and language. No gross focal neurologic deficits are appreciated. No gait instability. Skin:  Skin is warm, dry and intact.  Psychiatric: Mood and affect are normal. Speech and behavior are normal.  ____________________________________________   LABS (all labs ordered are listed, but only abnormal results are displayed)  Labs Reviewed  CBC WITH DIFFERENTIAL/PLATELET - Abnormal; Notable for the following components:      Result Value   WBC 15.1 (*)    Neutro Abs 13.5 (*)    Lymphs Abs 0.7 (*)    All other components within normal limits  COMPREHENSIVE METABOLIC PANEL - Abnormal; Notable for the following components:   Glucose, Bld 121 (*)    All other components within normal limits    RADIOLOGY  ED MD interpretation:   Right pelvis is positive for left femoral neck fracture.  Official radiology report(s): Dg Chest 1 View  Result Date: 05/15/2018 CLINICAL DATA:  Left shoulder pain following a fall. Smoker. EXAM: CHEST  1 VIEW COMPARISON:  06/12/2015. FINDINGS: Normal sized heart. Mildly tortuous and calcified thoracic aorta. Clear lungs. The lungs are hyperexpanded with diffuse accentuation of the interstitial markings. Cholecystectomy clips. Mild scoliosis. Upper lumbar spine degenerative changes. IMPRESSION: No acute abnormality.  Mildly progressive changes of COPD. Electronically Signed   By: Claudie Revering M.D.   On: 05/15/2018 14:18   Dg Pelvis 1-2 Views  Result Date: 05/15/2018 CLINICAL DATA:  Proximal left leg pain following a fall. EXAM: PELVIS - 1-2 VIEW COMPARISON:  Left femur radiographs obtained at the same time. Abdomen and pelvis CT dated 04/20/2015. FINDINGS: Left femoral neck fracture with varus angulation and proximal displacement of the distal fragment. No other fractures seen. Lower lumbar spine degenerative changes. Atheromatous arterial calcifications. IMPRESSION: Left femoral  neck fracture, as described above. Electronically Signed   By: Claudie Revering M.D.   On: 05/15/2018 14:17   Dg Tibia/fibula Left  Result Date: 05/15/2018 CLINICAL DATA:  Left lower leg pain following a fall. EXAM: LEFT TIBIA AND FIBULA -  2 VIEW COMPARISON:  None. FINDINGS: Moderate inferior posterior calcaneal spur formation. No fracture or dislocation. Atheromatous arterial calcifications. IMPRESSION: 1. No fracture or dislocation. 2. Calcaneal spurs. Electronically Signed   By: Claudie Revering M.D.   On: 05/15/2018 14:15   Dg Femur Min 2 Views Left  Result Date: 05/15/2018 CLINICAL DATA:  Left leg pain following a fall. EXAM: LEFT FEMUR 2 VIEWS COMPARISON:  None. FINDINGS: Left femoral neck fracture with varus angulation and proximal displacement of the distal fragment. Atheromatous arterial calcifications. IMPRESSION: Left femoral neck fracture, as described above. Electronically Signed   By: Claudie Revering M.D.   On: 05/15/2018 14:15   ____________________________________________   PROCEDURES  Procedure(s) performed: None  Procedures  Critical Care performed: No  ____________________________________________   INITIAL IMPRESSION / ASSESSMENT AND PLAN / ED COURSE  As part of my medical decision making, I reviewed the following data within the electronic MEDICAL RECORD NUMBER Notes from prior ED visits and [place] Controlled Substance Database  Patient presents to the ED via EMS after a mechanical fall while shopping.  Physical exam is suspicious for left hip injury.  X-rays confirm that patient has a femoral neck fracture on the left.  Patient lab work was ordered and she was given fentanyl 25 mg IV as she states that she does not tolerate pain medication well.  Patient also has not had anything to eat since approximately 8:30 AM when she ate a rice crispy treat.  Dr. Roland Rack was on-call and her x-ray findings was discussed.  Patient will be seen by the hospitalist and admitted.  Patient is n.p.o. until  further instructions per Dr. Roland Rack.  ____________________________________________   FINAL CLINICAL IMPRESSION(S) / ED DIAGNOSES  Final diagnoses:  Closed displaced fracture of left femoral neck ([place])  Fall, initial encounter     ED Discharge Orders    None       Note:  This document was prepared using Dragon voice recognition software and may include unintentional dictation errors.    Johnn Hai, PA-C 05/15/18 1612    Merlyn Lot, MD 05/16/18 [phone]

## 2018-05-15 NOTE — H&P (Signed)
[place] [facility] [place] NAME: Nicolette Gieske    MR#:  [id number]  DATE OF BIRTH:  11-Oct-1945  DATE OF ADMISSION:  05/15/2018  PRIMARY CARE PHYSICIAN: Rusty Aus, MD   REQUESTING/REFERRING PHYSICIAN: Dr. Merlyn Lot  CHIEF COMPLAINT:   Chief Complaint  Patient presents with  . Fall    HISTORY OF PRESENT ILLNESS:  Amee Boothe  is a 72 y.o. female with a known history of IBS, history of colon polyps, iron deficiency anemia, osteoporosis, GERD, diet-controlled diabetes presents to hospital secondary to a fall and left hip pain. Patient is independent and ambulatory at baseline.  She was walking at the shopping store [employer] today and accidentally tripped over a plastic wire coming out of box and landed on her left side.  She immediately felt the pain in her left hip and could not move.  EMS was called and was brought over to the emergency room.  X-ray indicating left femoral neck fracture.  Patient denies any cardiac history, denies any chest pain, palpitations or dyspnea.  She is otherwise active at baseline.  No recent nausea or vomiting.  Has GI symptoms secondary to her IBS. Labs are within normal limits. Going to OR today.  PAST MEDICAL HISTORY:   Past Medical History:  Diagnosis Date  . AVM (arteriovenous malformation)   . Barrett esophagus   . C. difficile diarrhea   . Carotid stenosis   . Diabetes mellitus without complication ([place])   . Diverticulosis   . GERD (gastroesophageal reflux disease)   . Heart murmur   . Hx of adenomatous colonic polyps   . Hyperlipidemia   . IBS (irritable bowel syndrome)   . Insomnia   . Iron deficiency anemia   . Kidney cysts    hx UTIs  . Microscopic hematuria   . Osteoporosis    mild  . PVD (peripheral vascular disease) ([place])   . Wears dentures    partial lower    PAST SURGICAL HISTORY:   Past Surgical History:  Procedure Laterality Date  . BUNIONECTOMY     right foot  .  CATARACT EXTRACTION     both eyes  . CERVICAL CONE BIOPSY    . CHOLECYSTECTOMY    . COLONOSCOPY    . ESOPHAGOGASTRODUODENOSCOPY (EGD) WITH PROPOFOL N/A 08/29/2017   Procedure: ESOPHAGOGASTRODUODENOSCOPY (EGD) WITH PROPOFOL;  Surgeon: Lucilla Lame, MD;  Location: [place];  Service: Endoscopy;  Laterality: N/A;  . FOOT SURGERY     right 2nd toe  . TUBAL LIGATION    . UPPER GASTROINTESTINAL ENDOSCOPY      SOCIAL HISTORY:   Social History   Tobacco Use  . Smoking status: Current Every Day Smoker    Packs/day: 0.50    Years: 55.00    Pack years: 27.50    Types: Cigarettes  . Smokeless tobacco: Never Used  . Tobacco comment: she has patches to start just not started yet. smoked since teenager.  Substance Use Topics  . Alcohol use: Yes    Alcohol/week: 2.0 standard drinks    Types: 2 Glasses of wine per week    Comment: socially    FAMILY HISTORY:   Family History  Problem Relation Age of Onset  . Diabetes Mother   . Heart disease Mother   . Hypertension Mother   . Diabetes Son   . Diabetes Sister   . Inflammatory bowel disease Father        diverticulitis  .  Multiple myeloma Father   . Hypertension Son   . Diabetes Maternal Grandmother   . Colon cancer Neg Hx   . Esophageal cancer Neg Hx   . Rectal cancer Neg Hx   . Stomach cancer Neg Hx   . Breast cancer Neg Hx     DRUG ALLERGIES:   Allergies  Allergen Reactions  . Codeine     "felt drunk, crazy"  . Hydrocodone-Acetaminophen Other (See Comments)    Confusion/delirium  . Nsaids Other (See Comments)    States it makes her fell drunk    REVIEW OF SYSTEMS:   Review of Systems  Constitutional: Negative for chills, fever, malaise/fatigue and weight loss.  HENT: Negative for ear discharge, ear pain, hearing loss, nosebleeds and tinnitus.   Eyes: Negative for blurred vision, double vision and photophobia.  Respiratory: Negative for cough, hemoptysis, shortness of breath and wheezing.     Cardiovascular: Negative for chest pain, palpitations, orthopnea and leg swelling.  Gastrointestinal: Negative for abdominal pain, constipation, diarrhea, heartburn, melena, nausea and vomiting.  Genitourinary: Negative for dysuria, frequency, hematuria and urgency.  Musculoskeletal: Positive for joint pain and myalgias. Negative for back pain and neck pain.  Skin: Negative for rash.  Neurological: Negative for dizziness, tingling, tremors, sensory change, speech change, focal weakness and headaches.  Endo/Heme/Allergies: Does not bruise/bleed easily.  Psychiatric/Behavioral: Negative for depression.    MEDICATIONS AT HOME:   Prior to Admission medications   Medication Sig Start Date End Date Taking? Authorizing Provider  Ascorbic Acid (VITAMIN C) 1000 MG tablet Take 1,000 mg by mouth daily.     Yes [provider]  Cholecalciferol (VITAMIN D) 1000 UNITS capsule Take 1,000 Units by mouth daily.     Yes [provider]  diazepam (VALIUM) 5 MG tablet Take 5 mg by mouth daily as needed for anxiety.  05/10/18  Yes [provider]  ferrous gluconate (FERGON) 240 (27 FE) MG tablet Take 240 mg by mouth daily.  09/09/17  Yes [provider]  loperamide (IMODIUM) 2 MG capsule Take 2 mg by mouth as needed for diarrhea or loose stools.   Yes [provider]  magnesium oxide (MAG-OX) 400 MG tablet Take 400 mg by mouth daily.   Yes [provider]  Multiple Vitamins-Minerals (PRESERVISION AREDS 2 PO) Take 1 tablet by mouth daily.   Yes [provider]  omeprazole (PRILOSEC) 20 MG capsule Take 2 tablets by mouth every morning (30 minutes before breakfast) and 1 tablet by mouth at bedtime. 07/03/13  Yes Lafayette Dragon, MD  simvastatin (ZOCOR) 40 MG tablet Take 40 mg by mouth at bedtime.     Yes [provider]  temazepam (RESTORIL) 15 MG capsule Take 15 mg by mouth at bedtime as needed for sleep. 05/10/18  Yes [provider]       VITAL SIGNS:  Blood pressure (!) 166/100, pulse (!) 54, temperature 98 F (36.7 C), temperature source Oral, resp. rate 18, height 5' 2"  (1.575 m), weight 47.6 kg, SpO2 98 %.  PHYSICAL EXAMINATION:   Physical Exam  GENERAL:  72 y.o.-year-old pleasant patient lying in the bed with no acute distress.  EYES: Pupils equal, round, reactive to light and accommodation. No scleral icterus. Extraocular muscles intact.  HEENT: Head atraumatic, normocephalic. Oropharynx and nasopharynx clear.  NECK:  Supple, no jugular venous distention. No thyroid enlargement, no tenderness.  LUNGS: Normal breath sounds bilaterally, no wheezing, rales,rhonchi or crepitation. No use of accessory muscles of respiration.  CARDIOVASCULAR:  S1, S2 normal. No  rubs, or gallops. 2/6 systolic murmur present ABDOMEN: Soft, nontender, nondistended. Bowel sounds present. No organomegaly or mass.  EXTREMITIES: left leg abducted and externally rotated No pedal edema, cyanosis, or clubbing.  NEUROLOGIC: Cranial nerves II through XII are intact. Muscle strength 5/5 in all extremities. Sensation intact. Gait not checked.  PSYCHIATRIC: The patient is alert and oriented x 3.  SKIN: No obvious rash, lesion, or ulcer.   LABORATORY PANEL:   CBC Recent Labs  Lab 05/15/18 1415  WBC 15.1*  HGB 15.3  HCT 45.2  PLT 277   ------------------------------------------------------------------------------------------------------------------  Chemistries  Recent Labs  Lab 05/15/18 1415  NA 138  K 4.3  CL 105  CO2 24  GLUCOSE 121*  BUN 11  CREATININE 0.57  CALCIUM 9.1  AST 25  ALT 16  ALKPHOS 62  BILITOT 0.6   ------------------------------------------------------------------------------------------------------------------  Cardiac Enzymes No results for input(s): TROPONINI in the last 168  hours. ------------------------------------------------------------------------------------------------------------------  RADIOLOGY:  Dg Chest 1 View  Result Date: 05/15/2018 CLINICAL DATA:  Left shoulder pain following a fall. Smoker. EXAM: CHEST  1 VIEW COMPARISON:  06/12/2015. FINDINGS: Normal sized heart. Mildly tortuous and calcified thoracic aorta. Clear lungs. The lungs are hyperexpanded with diffuse accentuation of the interstitial markings. Cholecystectomy clips. Mild scoliosis. Upper lumbar spine degenerative changes. IMPRESSION: No acute abnormality.  Mildly progressive changes of COPD. Electronically Signed   By: Claudie Revering M.D.   On: 05/15/2018 14:18   Dg Pelvis 1-2 Views  Result Date: 05/15/2018 CLINICAL DATA:  Proximal left leg pain following a fall. EXAM: PELVIS - 1-2 VIEW COMPARISON:  Left femur radiographs obtained at the same time. Abdomen and pelvis CT dated 04/20/2015. FINDINGS: Left femoral neck fracture with varus angulation and proximal displacement of the distal fragment. No other fractures seen. Lower lumbar spine degenerative changes. Atheromatous arterial calcifications. IMPRESSION: Left femoral neck fracture, as described above. Electronically Signed   By: Claudie Revering M.D.   On: 05/15/2018 14:17   Dg Tibia/fibula Left  Result Date: 05/15/2018 CLINICAL DATA:  Left lower leg pain following a fall. EXAM: LEFT TIBIA AND FIBULA - 2 VIEW COMPARISON:  None. FINDINGS: Moderate inferior posterior calcaneal spur formation. No fracture or dislocation. Atheromatous arterial calcifications. IMPRESSION: 1. No fracture or dislocation. 2. Calcaneal spurs. Electronically Signed   By: Claudie Revering M.D.   On: 05/15/2018 14:15   Dg Femur Min 2 Views Left  Result Date: 05/15/2018 CLINICAL DATA:  Left leg pain following a fall. EXAM: LEFT FEMUR 2 VIEWS COMPARISON:  None. FINDINGS: Left femoral neck fracture with varus angulation and proximal displacement of the distal fragment.  Atheromatous arterial calcifications. IMPRESSION: Left femoral neck fracture, as described above. Electronically Signed   By: Claudie Revering M.D.   On: 05/15/2018 14:15    EKG:   Orders placed or performed during the hospital encounter of 05/15/18  . ED EKG  . ED EKG    IMPRESSION AND PLAN:   Kinjal Neitzke  is a 72 y.o. female with a known history of IBS, history of colon polyps, iron deficiency anemia, osteoporosis, GERD, diet-controlled diabetes presents to hospital secondary to a fall and left hip pain.  1. Left femoral neck fracture-orthopedic surgery consulted - pain control - DVT prophylaxis and physical therapy after surgery - monitor hb after surgery  2.  Hyperlipidemia-statin  3.  Tobacco use disorder-counseled, refused nicotine patch for now  4.  GERD-on PPI  5.  DVT prophylaxis-will be started after surgery  All the records are reviewed and case discussed with ED provider. Management plans discussed with the patient, family and they are in agreement.  CODE STATUS: Full Code  TOTAL TIME TAKING CARE OF THIS PATIENT: 50 minutes.    Jacorian Golaszewski M.D on 05/15/2018 at 3:45 PM  Between 7am to 6pm - Pager - [phone]  After 6pm go to [email or web address] - password [email or web address] [facility]  Office  [phone]  CC: Primary care physician; Rusty Aus, MD

## 2018-05-15 NOTE — ED Triage Notes (Signed)
Tripped and fell at store, pain L shoulder, hip knee and ankle. No LOC.

## 2018-05-15 NOTE — ED Notes (Signed)
See triage note  States she was at [employer] and tripped   Landed on left side  Having pain to left ankle.knee,hip,and shoulder  States she hit her head but denies any headache or neck pain

## 2018-05-15 NOTE — Consult Note (Signed)
ORTHOPAEDIC CONSULTATION  REQUESTING PHYSICIAN: Gladstone Lighter, MD  Chief Complaint:   Left hip pain.  History of Present Illness: Yolanda Jimenez is a 72 y.o. 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  She was brought to the emergency room where x-rays demonstrated a displaced left femoral neck fracture.  The patient notes soreness to her left shoulder, left hip, and left knee, but she did not strike her head or lose consciousness.  She denies any lightheadedness, dizziness, chest pain, shortness of breath, or other symptoms which may have precipitated her fall.  Past Medical History:  Diagnosis Date  . AVM (arteriovenous malformation)   . Barrett esophagus   . C. difficile diarrhea   . Carotid stenosis   . Diabetes mellitus without complication ([place])   . Diverticulosis   . GERD (gastroesophageal reflux disease)   . Heart murmur   . Hx of adenomatous colonic polyps   . Hyperlipidemia   . IBS (irritable bowel syndrome)   . Insomnia   . Iron deficiency anemia   . Kidney cysts    hx UTIs  . Microscopic hematuria   . Osteoporosis    mild  . PVD (peripheral vascular disease) ([place])   . Wears dentures    partial lower   Past Surgical History:  Procedure Laterality Date  . BUNIONECTOMY     right foot  . CATARACT EXTRACTION     both eyes  . CERVICAL CONE BIOPSY    . CHOLECYSTECTOMY    . COLONOSCOPY    . ESOPHAGOGASTRODUODENOSCOPY (EGD) WITH PROPOFOL N/A 08/29/2017   Procedure: ESOPHAGOGASTRODUODENOSCOPY (EGD) WITH PROPOFOL;  Surgeon: Lucilla Lame, MD;   Location: [facility];  Service: Endoscopy;  Laterality: N/A;  . FOOT SURGERY     right 2nd toe  . TUBAL LIGATION    . UPPER GASTROINTESTINAL ENDOSCOPY     Social History   Socioeconomic History  . Marital status: Single    Spouse name: Not on file  . Number of children: 2  . Years of education: Not on file  . Highest education level: Not on file  Occupational History  . Occupation: Retired  Scientific laboratory technician  . Financial resource strain: Not on file  . Food insecurity:    Worry: Not on file    Inability: Not on file  . Transportation needs:    Medical: Not on file    Non-medical: Not on file  Tobacco Use  . Smoking status: Current Every Day Smoker    Packs/day: 0.50    Years: 55.00    Pack years: 27.50    Types: Cigarettes  . Smokeless tobacco: Never Used  . Tobacco comment: she has patches to start just not started yet. smoked since teenager.  Substance and Sexual Activity  . Alcohol use: Yes    Alcohol/week: 2.0 standard drinks    Types: 2 Glasses of wine per week    Comment: socially  . Drug use: No  . Sexual activity: Not on file  Lifestyle  . Physical activity:    Days per week: Not on file    Minutes per session: Not on file  . Stress: Not on file  Relationships  .  Social connections:    Talks on phone: Not on file    Gets together: Not on file    Attends religious service: Not on file    Active member of club or organization: Not on file    Attends meetings of clubs or organizations: Not on file    Relationship status: Not on file  Other Topics Concern  . Not on file  Social History Narrative   Independent at baseline.  Lives at home with her husband   Family History  Problem Relation Age of Onset  . Diabetes Mother   . Heart disease Mother   . Hypertension Mother   . Diabetes Son   . Diabetes Sister   . Inflammatory bowel disease Father        diverticulitis  . Multiple myeloma Father   . Hypertension Son   . Diabetes Maternal  Grandmother   . Colon cancer Neg Hx   . Esophageal cancer Neg Hx   . Rectal cancer Neg Hx   . Stomach cancer Neg Hx   . Breast cancer Neg Hx    Allergies  Allergen Reactions  . Codeine     "felt drunk, crazy"  . Hydrocodone-Acetaminophen Other (See Comments)    Confusion/delirium  . Nsaids Other (See Comments)    States it makes her fell drunk   Prior to Admission medications   Medication Sig Start Date End Date Taking? Authorizing Provider  Ascorbic Acid (VITAMIN C) 1000 MG tablet Take 1,000 mg by mouth daily.     Yes [provider]  Cholecalciferol (VITAMIN D) 1000 UNITS capsule Take 1,000 Units by mouth daily.     Yes [provider]  diazepam (VALIUM) 5 MG tablet Take 5 mg by mouth daily as needed for anxiety.  05/10/18  Yes [provider]  ferrous gluconate (FERGON) 240 (27 FE) MG tablet Take 240 mg by mouth daily.  09/09/17  Yes [provider]  loperamide (IMODIUM) 2 MG capsule Take 2 mg by mouth as needed for diarrhea or loose stools.   Yes [provider]  magnesium oxide (MAG-OX) 400 MG tablet Take 400 mg by mouth daily.   Yes [provider]  Multiple Vitamins-Minerals (PRESERVISION AREDS 2 PO) Take 1 tablet by mouth daily.   Yes [provider]  omeprazole (PRILOSEC) 20 MG capsule Take 2 tablets by mouth every morning (30 minutes before breakfast) and 1 tablet by mouth at bedtime. 07/03/13  Yes Lafayette Dragon, MD  simvastatin (ZOCOR) 40 MG tablet Take 40 mg by mouth at bedtime.     Yes [provider]  temazepam (RESTORIL) 15 MG capsule Take 15 mg by mouth at bedtime as needed for sleep. 05/10/18  Yes [provider]   Dg Chest 1 View  Result Date: 05/15/2018 CLINICAL DATA:  Left shoulder pain following a fall. Smoker. EXAM: CHEST  1 VIEW COMPARISON:  06/12/2015. FINDINGS: Normal sized heart. Mildly tortuous and calcified thoracic aorta. Clear lungs. The lungs are hyperexpanded with diffuse  accentuation of the interstitial markings. Cholecystectomy clips. Mild scoliosis. Upper lumbar spine degenerative changes. IMPRESSION: No acute abnormality.  Mildly progressive changes of COPD. Electronically Signed   By: Claudie Revering M.D.   On: 05/15/2018 14:18   Dg Pelvis 1-2 Views  Result Date: 05/15/2018 CLINICAL DATA:  Proximal left leg pain following a fall. EXAM: PELVIS - 1-2 VIEW COMPARISON:  Left femur radiographs obtained at the same time. Abdomen and pelvis CT dated 04/20/2015. FINDINGS: Left femoral  neck fracture with varus angulation and proximal displacement of the distal fragment. No other fractures seen. Lower lumbar spine degenerative changes. Atheromatous arterial calcifications. IMPRESSION: Left femoral neck fracture, as described above. Electronically Signed   By: Claudie Revering M.D.   On: 05/15/2018 14:17   Dg Tibia/fibula Left  Result Date: 05/15/2018 CLINICAL DATA:  Left lower leg pain following a fall. EXAM: LEFT TIBIA AND FIBULA - 2 VIEW COMPARISON:  None. FINDINGS: Moderate inferior posterior calcaneal spur formation. No fracture or dislocation. Atheromatous arterial calcifications. IMPRESSION: 1. No fracture or dislocation. 2. Calcaneal spurs. Electronically Signed   By: Claudie Revering M.D.   On: 05/15/2018 14:15   Dg Femur Min 2 Views Left  Result Date: 05/15/2018 CLINICAL DATA:  Left leg pain following a fall. EXAM: LEFT FEMUR 2 VIEWS COMPARISON:  None. FINDINGS: Left femoral neck fracture with varus angulation and proximal displacement of the distal fragment. Atheromatous arterial calcifications. IMPRESSION: Left femoral neck fracture, as described above. Electronically Signed   By: Claudie Revering M.D.   On: 05/15/2018 14:15    Positive ROS: All other systems have been reviewed and were otherwise negative with the exception of those mentioned in the HPI and as above.  Physical Exam: General:  Alert, no acute distress Psychiatric:  Patient is competent for consent with  normal mood and affect   Cardiovascular:  No pedal edema Respiratory:  No wheezing, non-labored breathing GI:  Abdomen is soft and non-tender Skin:  No lesions in the area of chief complaint Neurologic:  Sensation intact distally Lymphatic:  No axillary or cervical lymphadenopathy  Orthopedic Exam:  Orthopedic examination is limited to the left hip and lower extremity.  The hip is held in a somewhat flexed and internally rotated position.  The left lower extremity is shorter than the right.  Skin inspection around the left hip is unremarkable.  There is no swelling, erythema, ecchymosis, abrasions, or other skin normality is identified.  She has only mild tenderness to palpation over the lateral aspect of the left hip.  She has more severe pain with any attempted active or passive motion of the left hip.  She is neurovascularly intact to the left lower extremity and foot, demonstrating the ability to actively dorsiflex and plantarflex her toes and ankle.  Sensation is intact to light touch to all distributions.  She has good capillary refill to her left foot.  X-rays:  X-rays of the pelvis and left hip are available for review.  These films demonstrate a completely displaced left femoral neck fracture with varus alignment of the fracture.  There are no significant degenerative changes of the hip joint.  No lytic lesions or other acute bony abnormalities are identified.  Assessment: Displaced left femoral neck fracture.  Plan: The treatment options have been discussed with the patient and her family, who is at the bedside, including both surgical and nonsurgical choices.  Patient would like to proceed with surgical intervention to include a left hip hemiarthroplasty.  This procedure has been discussed in detail with the patient, as have the potential risks (including bleeding, infection, nerve and blood vessel injury, persistent or recurrent pain, stiffness, dislocation, leg length inequality,  loosening of any failure of the components, need for further surgery, blood clots, strokes, heart attacks and/or arrhythmias, etc.) and benefits.  The patient states her understanding wishes to proceed.  A formal written consent will be obtained by the nursing staff.  Thank you for asked me to participate in the care of this  most pleasant woman.  I will be happy to follow her with you.   Pascal Lux, MD  Beeper #:  [phone]  05/15/2018 4:58 PM

## 2018-05-15 NOTE — ED Provider Notes (Signed)
-----------------------------------------   4:06 PM on 05/15/2018 -----------------------------------------  EKG reviewed and interpreted by myself shows a normal sinus rhythm at 66 bpm with a narrow QRS, normal axis, normal intervals with no ST changes.  Reassuring EKG.   Harvest Dark, MD 05/15/18 1606

## 2018-05-15 NOTE — ED Notes (Signed)
Admitting MD in with pt    

## 2018-05-16 ENCOUNTER — Inpatient Hospital Stay: Payer: Medicare Other | Admitting: Certified Registered Nurse Anesthetist

## 2018-05-16 ENCOUNTER — Encounter: Payer: Self-pay | Admitting: Anesthesiology

## 2018-05-16 ENCOUNTER — Inpatient Hospital Stay: Payer: Medicare Other

## 2018-05-16 ENCOUNTER — Encounter
Admission: EM | Disposition: A | Discharge status: Skilled Nursing Facility | Payer: Self-pay | Source: Home / Self Care | Attending: Internal Medicine

## 2018-05-16 HISTORY — PX: HIP ARTHROPLASTY: SHX981

## 2018-05-16 LAB — BASIC METABOLIC PANEL
Anion gap: 6 (ref 5–15)
BUN: 10 mg/dL (ref 8–23)
CO2: 26 mmol/L (ref 22–32)
Calcium: 8.3 mg/dL — ABNORMAL LOW (ref 8.9–10.3)
Chloride: 105 mmol/L (ref 98–111)
Creatinine, Ser: 0.6 mg/dL (ref 0.44–1.00)
GFR calc Af Amer: >60 mL/min (ref >60)
GFR calc non Af Amer: >60 mL/min (ref >60)
Glucose, Bld: 101 mg/dL — ABNORMAL HIGH (ref 70–99)
Potassium: 3.7 mmol/L (ref 3.5–5.1)
Sodium: 137 mmol/L (ref 135–145)

## 2018-05-16 LAB — CBC
HCT: 42.7 % (ref 35.0–47.0)
Hemoglobin: 14.3 g/dL (ref 12.0–16.0)
MCH: 33.3 pg (ref 26.0–34.0)
MCHC: 33.4 g/dL (ref 32.0–36.0)
MCV: 99.8 fL (ref 80.0–100.0)
Platelets: 240 10*3/uL (ref 150–440)
RBC: 4.28 MIL/uL (ref 3.80–5.20)
RDW: 14 % (ref 11.5–14.5)
WBC: 8.1 10*3/uL (ref 3.6–11.0)

## 2018-05-16 LAB — SURGICAL PCR SCREEN
MRSA, PCR: NEGATIVE
Staphylococcus aureus: NEGATIVE

## 2018-05-16 LAB — GLUCOSE, CAPILLARY: Glucose-Capillary: 83 mg/dL (ref 70–99)

## 2018-05-16 SURGERY — HEMIARTHROPLASTY, HIP, DIRECT ANTERIOR APPROACH, FOR FRACTURE
Anesthesia: Spinal | Site: Hip | Laterality: Left

## 2018-05-16 MED ORDER — PROPOFOL 500 MG/50ML IV EMUL
INTRAVENOUS | Status: AC
  Filled 2018-05-16: qty 50

## 2018-05-16 MED ORDER — BUPIVACAINE-EPINEPHRINE (PF) 0.25% -1:200000 IJ SOLN
INTRAMUSCULAR | Status: DC | PRN
  Administered 2018-05-16: 30 mL

## 2018-05-16 MED ORDER — ENOXAPARIN SODIUM 40 MG/0.4ML ~~LOC~~ SOLN
40.0000 mg | SUBCUTANEOUS | Status: DC
  Administered 2018-05-17 – 2018-05-18 (×2): 40 mg via SUBCUTANEOUS
  Filled 2018-05-16 (×2): qty 0.4

## 2018-05-16 MED ORDER — OXYCODONE HCL 5 MG PO TABS: 10.0000 mg | ORAL_TABLET | ORAL | Status: DC | PRN

## 2018-05-16 MED ORDER — MAGNESIUM HYDROXIDE 400 MG/5ML PO SUSP
30.0000 mL | Freq: Every day | ORAL | Status: DC | PRN
  Filled 2018-05-16: qty 30

## 2018-05-16 MED ORDER — BUPIVACAINE HCL (PF) 0.5 % IJ SOLN
INTRAMUSCULAR | Status: DC | PRN
  Administered 2018-05-16: 3 mL

## 2018-05-16 MED ORDER — MIDAZOLAM HCL 2 MG/2ML IJ SOLN
INTRAMUSCULAR | Status: AC
  Filled 2018-05-16: qty 2

## 2018-05-16 MED ORDER — MEPERIDINE HCL 50 MG/ML IJ SOLN
12.5000 mg | Freq: Once | INTRAMUSCULAR | Status: AC
  Administered 2018-05-16: 12.5 mg via INTRAVENOUS

## 2018-05-16 MED ORDER — DIPHENHYDRAMINE HCL 12.5 MG/5ML PO ELIX: 12.5000 mg | ORAL_SOLUTION | ORAL | Status: DC | PRN

## 2018-05-16 MED ORDER — DOCUSATE SODIUM 100 MG PO CAPS
100.0000 mg | ORAL_CAPSULE | Freq: Two times a day (BID) | ORAL | Status: DC
  Administered 2018-05-17 – 2018-05-18 (×3): 100 mg via ORAL
  Filled 2018-05-16 (×4): qty 1

## 2018-05-16 MED ORDER — ACETAMINOPHEN 500 MG PO TABS
1000.0000 mg | ORAL_TABLET | Freq: Four times a day (QID) | ORAL | Status: AC
  Administered 2018-05-16 – 2018-05-17 (×2): 1000 mg via ORAL
  Filled 2018-05-16 (×4): qty 2

## 2018-05-16 MED ORDER — TRAMADOL HCL 50 MG PO TABS
50.0000 mg | ORAL_TABLET | Freq: Four times a day (QID) | ORAL | Status: DC | PRN
  Administered 2018-05-17 (×2): 50 mg via ORAL
  Filled 2018-05-16 (×2): qty 1

## 2018-05-16 MED ORDER — BISACODYL 10 MG RE SUPP
10.0000 mg | Freq: Every day | RECTAL | Status: DC | PRN
  Administered 2018-05-18: 10 mg via RECTAL
  Filled 2018-05-16: qty 1

## 2018-05-16 MED ORDER — METOCLOPRAMIDE HCL 5 MG/ML IJ SOLN: 5.0000 mg | Freq: Three times a day (TID) | INTRAMUSCULAR | Status: DC | PRN

## 2018-05-16 MED ORDER — HYDROMORPHONE HCL 1 MG/ML IJ SOLN: 0.5000 mg | INTRAMUSCULAR | Status: DC | PRN

## 2018-05-16 MED ORDER — FLEET ENEMA 7-19 GM/118ML RE ENEM: 1.0000 | ENEMA | Freq: Once | RECTAL | Status: DC | PRN

## 2018-05-16 MED ORDER — KETAMINE HCL 50 MG/ML IJ SOLN
INTRAMUSCULAR | Status: DC | PRN
  Administered 2018-05-16: 25 mg via INTRAMUSCULAR

## 2018-05-16 MED ORDER — LOPERAMIDE HCL 2 MG PO CAPS
2.0000 mg | ORAL_CAPSULE | ORAL | Status: DC | PRN
  Filled 2018-05-16: qty 1

## 2018-05-16 MED ORDER — ACETAMINOPHEN 325 MG PO TABS: 325.0000 mg | ORAL_TABLET | Freq: Four times a day (QID) | ORAL | Status: DC | PRN

## 2018-05-16 MED ORDER — ONDANSETRON HCL 4 MG/2ML IJ SOLN
INTRAMUSCULAR | Status: AC
  Administered 2018-05-16: 4 mg via INTRAVENOUS
  Filled 2018-05-16: qty 2

## 2018-05-16 MED ORDER — PROPOFOL 500 MG/50ML IV EMUL
INTRAVENOUS | Status: DC | PRN
  Administered 2018-05-16: 25 ug/kg/min via INTRAVENOUS

## 2018-05-16 MED ORDER — LACTATED RINGERS IV SOLN
INTRAVENOUS | Status: DC | PRN
  Administered 2018-05-16 (×2): via INTRAVENOUS

## 2018-05-16 MED ORDER — ONDANSETRON HCL 4 MG/2ML IJ SOLN
4.0000 mg | Freq: Once | INTRAMUSCULAR | Status: AC | PRN
  Administered 2018-05-16: 4 mg via INTRAVENOUS

## 2018-05-16 MED ORDER — FENTANYL CITRATE (PF) 250 MCG/5ML IJ SOLN
INTRAMUSCULAR | Status: AC
  Filled 2018-05-16: qty 5

## 2018-05-16 MED ORDER — FENTANYL CITRATE (PF) 100 MCG/2ML IJ SOLN: 25.0000 ug | INTRAMUSCULAR | Status: DC | PRN

## 2018-05-16 MED ORDER — NEOMYCIN-POLYMYXIN B GU 40-200000 IR SOLN
Status: DC | PRN
  Administered 2018-05-16: 16 mL

## 2018-05-16 MED ORDER — MIDAZOLAM HCL 5 MG/5ML IJ SOLN
INTRAMUSCULAR | Status: DC | PRN
  Administered 2018-05-16: .5 mg via INTRAVENOUS
  Administered 2018-05-16: 1 mg via INTRAVENOUS
  Administered 2018-05-16: 0.5 mg via INTRAVENOUS

## 2018-05-16 MED ORDER — BUPIVACAINE LIPOSOME 1.3 % IJ SUSP
INTRAMUSCULAR | Status: DC | PRN
  Administered 2018-05-16: 20 mL

## 2018-05-16 MED ORDER — METOCLOPRAMIDE HCL 10 MG PO TABS: 5.0000 mg | ORAL_TABLET | Freq: Three times a day (TID) | ORAL | Status: DC | PRN

## 2018-05-16 MED ORDER — PROPOFOL 10 MG/ML IV BOLUS
INTRAVENOUS | Status: DC | PRN
  Administered 2018-05-16 (×2): 20 mg via INTRAVENOUS

## 2018-05-16 MED ORDER — MEPERIDINE HCL 50 MG/ML IJ SOLN
INTRAMUSCULAR | Status: AC
  Filled 2018-05-16: qty 1

## 2018-05-16 MED ORDER — CEFAZOLIN SODIUM-DEXTROSE 2-4 GM/100ML-% IV SOLN
2.0000 g | Freq: Four times a day (QID) | INTRAVENOUS | Status: AC
  Administered 2018-05-16 – 2018-05-17 (×3): 2 g via INTRAVENOUS
  Filled 2018-05-16 (×4): qty 100

## 2018-05-16 MED ORDER — LIDOCAINE HCL (PF) 2 % IJ SOLN
INTRAMUSCULAR | Status: AC
  Filled 2018-05-16: qty 10

## 2018-05-16 MED ORDER — ONDANSETRON HCL 4 MG/2ML IJ SOLN: 4.0000 mg | Freq: Four times a day (QID) | INTRAMUSCULAR | Status: DC | PRN

## 2018-05-16 MED ORDER — TRANEXAMIC ACID 1000 MG/10ML IV SOLN
INTRAVENOUS | Status: DC | PRN
  Administered 2018-05-16: 1000 mg

## 2018-05-16 MED ORDER — ONDANSETRON HCL 4 MG PO TABS: 4.0000 mg | ORAL_TABLET | Freq: Four times a day (QID) | ORAL | Status: DC | PRN

## 2018-05-16 SURGICAL SUPPLY — 62 items
BAG DECANTER FOR FLEXI CONT (MISCELLANEOUS) IMPLANT
BLADE SAGITTAL WIDE XTHICK NO (BLADE) ×2 IMPLANT
BLADE SURG SZ20 CARB STEEL (BLADE) ×2 IMPLANT
BNDG COHESIVE 6X5 TAN STRL LF (GAUZE/BANDAGES/DRESSINGS) ×2 IMPLANT
BOWL CEMENT MIXING ADV NOZZLE (MISCELLANEOUS) IMPLANT
CANISTER SUCT 1200ML W/VALVE (MISCELLANEOUS) ×2 IMPLANT
CANISTER SUCT 3000ML PPV (MISCELLANEOUS) ×4 IMPLANT
CHLORAPREP W/TINT 26ML (MISCELLANEOUS) ×4 IMPLANT
DECANTER SPIKE VIAL GLASS SM (MISCELLANEOUS) ×4 IMPLANT
DRAPE IMP U-DRAPE 54X76 (DRAPES) ×4 IMPLANT
DRAPE INCISE IOBAN 66X60 STRL (DRAPES) ×2 IMPLANT
DRAPE SHEET LG 3/4 BI-LAMINATE (DRAPES) ×2 IMPLANT
DRAPE SURG 17X11 SM STRL (DRAPES) ×2 IMPLANT
DRAPE SURG 17X23 STRL (DRAPES) ×2 IMPLANT
DRSG OPSITE POSTOP 4X12 (GAUZE/BANDAGES/DRESSINGS) ×2 IMPLANT
DRSG OPSITE POSTOP 4X14 (GAUZE/BANDAGES/DRESSINGS) ×2 IMPLANT
ELECT BLADE 6.5 EXT (BLADE) ×2 IMPLANT
ELECT CAUTERY BLADE 6.4 (BLADE) ×2 IMPLANT
ELECT REM PT RETURN 9FT ADLT (ELECTROSURGICAL) ×2
ELECTRODE REM PT RTRN 9FT ADLT (ELECTROSURGICAL) ×1 IMPLANT
GAUZE PACK 2X3YD (MISCELLANEOUS) IMPLANT
GLOVE BIO SURGEON STRL SZ8 (GLOVE) ×4 IMPLANT
GLOVE INDICATOR 8.0 STRL GRN (GLOVE) ×2 IMPLANT
GOWN STRL REUS W/ TWL LRG LVL3 (GOWN DISPOSABLE) ×1 IMPLANT
GOWN STRL REUS W/ TWL XL LVL3 (GOWN DISPOSABLE) ×1 IMPLANT
GOWN STRL REUS W/TWL LRG LVL3 (GOWN DISPOSABLE) ×1
GOWN STRL REUS W/TWL XL LVL3 (GOWN DISPOSABLE) ×1
HEAD ENDO II MOD SZ 45 (Orthopedic Implant) ×1 IMPLANT
HOOD PEEL AWAY FLYTE STAYCOOL (MISCELLANEOUS) ×4 IMPLANT
INSERT TAPER ENDO II -3 (Orthopedic Implant) ×1 IMPLANT
IV NS 100ML SINGLE PACK (IV SOLUTION) IMPLANT
LABEL OR SOLS (LABEL) ×2 IMPLANT
MAT ABSORB  FLUID 56X50 GRAY (MISCELLANEOUS) ×1
MAT ABSORB FLUID 56X50 GRAY (MISCELLANEOUS) ×1 IMPLANT
NDL FILTER BLUNT 18X1 1/2 (NEEDLE) ×1 IMPLANT
NDL SAFETY ECLIPSE 18X1.5 (NEEDLE) ×1 IMPLANT
NDL SPNL 20GX3.5 QUINCKE YW (NEEDLE) ×1 IMPLANT
NEEDLE FILTER BLUNT 18X 1/2SAF (NEEDLE) ×1
NEEDLE FILTER BLUNT 18X1 1/2 (NEEDLE) ×1 IMPLANT
NEEDLE HYPO 18GX1.5 SHARP (NEEDLE) ×1
NEEDLE SPNL 20GX3.5 QUINCKE YW (NEEDLE) ×2 IMPLANT
NS IRRIG 1000ML POUR BTL (IV SOLUTION) ×2 IMPLANT
PACK HIP PROSTHESIS (MISCELLANEOUS) ×2 IMPLANT
PULSAVAC PLUS IRRIG FAN TIP (DISPOSABLE) ×2
SOL .9 NS 3000ML IRR  AL (IV SOLUTION) ×2
SOL .9 NS 3000ML IRR UROMATIC (IV SOLUTION) ×2 IMPLANT
STAPLER SKIN PROX 35W (STAPLE) ×2 IMPLANT
STEM COLLARLESS RED 11X135MM (Stem) ×1 IMPLANT
STRAP SAFETY 5IN WIDE (MISCELLANEOUS) ×2 IMPLANT
SUT ETHIBOND 2 V 37 (SUTURE) ×6 IMPLANT
SUT VIC AB 1 CT1 36 (SUTURE) ×4 IMPLANT
SUT VIC AB 2-0 CT1 (SUTURE) ×6 IMPLANT
SUT VIC AB 2-0 CT1 27 (SUTURE) ×3
SUT VIC AB 2-0 CT1 TAPERPNT 27 (SUTURE) ×3 IMPLANT
SUT VICRYL 1-0 27IN ABS (SUTURE) ×4
SUTURE VICRYL 1-0 27IN ABS (SUTURE) ×2 IMPLANT
SYR 10ML LL (SYRINGE) ×2 IMPLANT
SYR 30ML LL (SYRINGE) ×6 IMPLANT
SYR TB 1ML 27GX1/2 LL (SYRINGE) IMPLANT
TAPE TRANSPORE STRL 2 31045 (GAUZE/BANDAGES/DRESSINGS) ×2 IMPLANT
TIP BRUSH PULSAVAC PLUS 24.33 (MISCELLANEOUS) ×2 IMPLANT
TIP FAN IRRIG PULSAVAC PLUS (DISPOSABLE) ×1 IMPLANT

## 2018-05-16 NOTE — Anesthesia Post-op Follow-up Note (Signed)
Anesthesia QCDR form completed.        

## 2018-05-16 NOTE — Progress Notes (Signed)
[facility] [place] NAME: Cathlene Gardella    MR#:  [id number]  DATE OF BIRTH:  [birth year]  SUBJECTIVE:   Patient came after she had asked in a fall while shopping at [employer] tripped over the plastic strap. Complained of left hip pain found to have hip fracture now awaiting surgery. REVIEW OF SYSTEMS:   Review of Systems  Constitutional: Negative for chills, fever and weight loss.  HENT: Negative for ear discharge, ear pain and nosebleeds.   Eyes: Negative for blurred vision, pain and discharge.  Respiratory: Negative for sputum production, shortness of breath, wheezing and stridor.   Cardiovascular: Negative for chest pain, palpitations, orthopnea and PND.  Gastrointestinal: Negative for abdominal pain, diarrhea, nausea and vomiting.  Genitourinary: Negative for frequency and urgency.  Musculoskeletal: Positive for back pain and joint pain.  Neurological: Negative for sensory change, speech change, focal weakness and weakness.  Psychiatric/Behavioral: Negative for depression and hallucinations. The patient is not nervous/anxious.    Tolerating Diet:npo Tolerating PT: pending  DRUG ALLERGIES:   Allergies  Allergen Reactions  . Codeine     "felt drunk, crazy"  . Hydrocodone-Acetaminophen Other (See Comments)    Confusion/delirium  . Nsaids Other (See Comments)    States it makes her fell drunk    VITALS:  Blood pressure (!) 141/74, pulse 61, temperature 98.1 F (36.7 C), temperature source Oral, resp. rate 18, height 5\' 2"  (1.575 m), weight 47.6 kg, SpO2 96 %.  PHYSICAL EXAMINATION:   Physical Exam  GENERAL:  72 y.o.-year-old patient lying in the bed with no acute distress.  EYES: Pupils equal, round, reactive to light and accommodation. No scleral icterus. Extraocular muscles intact.  HEENT: Head atraumatic, normocephalic. Oropharynx and nasopharynx clear.  NECK:  Supple, no jugular venous distention. No thyroid enlargement,  no tenderness.  LUNGS: Normal breath sounds bilaterally, no wheezing, rales, rhonchi. No use of accessory muscles of respiration.  CARDIOVASCULAR: S1, S2 normal. No murmurs, rubs, or gallops.  ABDOMEN: Soft, nontender, nondistended. Bowel sounds present. No organomegaly or mass.  EXTREMITIES: No cyanosis, clubbing or edema b/l. Left lower extremity decreased range of motion secondary to fracture NEUROLOGIC: Cranial nerves II through XII are intact. No focal Motor or sensory deficits b/l.   PSYCHIATRIC:  patient is alert and oriented x 3.  SKIN: No obvious rash, lesion, or ulcer.   LABORATORY PANEL:  CBC Recent Labs  Lab 05/16/18 0559  WBC 8.1  HGB 14.3  HCT 42.7  PLT 240    Chemistries  Recent Labs  Lab 05/15/18 1415 05/16/18 0559  NA 138 137  K 4.3 3.7  CL 105 105  CO2 24 26  GLUCOSE 121* 101*  BUN 11 10  CREATININE 0.57 0.60  CALCIUM 9.1 8.3*  AST 25  --   ALT 16  --   ALKPHOS 62  --   BILITOT 0.6  --    Cardiac Enzymes No results for input(s): TROPONINI in the last 168 hours. RADIOLOGY:  Dg Chest 1 View  Result Date: 05/15/2018 CLINICAL DATA:  Left shoulder pain following a fall. Smoker. EXAM: CHEST  1 VIEW COMPARISON:  06/12/2015. FINDINGS: Normal sized heart. Mildly tortuous and calcified thoracic aorta. Clear lungs. The lungs are hyperexpanded with diffuse accentuation of the interstitial markings. Cholecystectomy clips. Mild scoliosis. Upper lumbar spine degenerative changes. IMPRESSION: No acute abnormality.  Mildly progressive changes of COPD. Electronically Signed   By: Claudie Revering M.D.   On: 05/15/2018 14:18  Dg Pelvis 1-2 Views  Result Date: 05/15/2018 CLINICAL DATA:  Proximal left leg pain following a fall. EXAM: PELVIS - 1-2 VIEW COMPARISON:  Left femur radiographs obtained at the same time. Abdomen and pelvis CT dated 04/20/2015. FINDINGS: Left femoral neck fracture with varus angulation and proximal displacement of the distal fragment. No other  fractures seen. Lower lumbar spine degenerative changes. Atheromatous arterial calcifications. IMPRESSION: Left femoral neck fracture, as described above. Electronically Signed   By: Claudie Revering M.D.   On: 05/15/2018 14:17   Dg Tibia/fibula Left  Result Date: 05/15/2018 CLINICAL DATA:  Left lower leg pain following a fall. EXAM: LEFT TIBIA AND FIBULA - 2 VIEW COMPARISON:  None. FINDINGS: Moderate inferior posterior calcaneal spur formation. No fracture or dislocation. Atheromatous arterial calcifications. IMPRESSION: 1. No fracture or dislocation. 2. Calcaneal spurs. Electronically Signed   By: Claudie Revering M.D.   On: 05/15/2018 14:15   Dg Femur Min 2 Views Left  Result Date: 05/15/2018 CLINICAL DATA:  Left leg pain following a fall. EXAM: LEFT FEMUR 2 VIEWS COMPARISON:  None. FINDINGS: Left femoral neck fracture with varus angulation and proximal displacement of the distal fragment. Atheromatous arterial calcifications. IMPRESSION: Left femoral neck fracture, as described above. Electronically Signed   By: Claudie Revering M.D.   On: 05/15/2018 14:15   ASSESSMENT AND PLAN:  * Aneliz Carbary  is a 72 y.o. female with a known history of IBS, history of colon polyps, iron deficiency anemia, osteoporosis, GERD, diet-controlled diabetes presents to hospital secondary to a fall and left hip pain.  1. Left femoral neck fracture-orthopedic surgery consulted--Dr poggi - pain control - DVT prophylaxis and physical therapy after surgery - monitor hb after surgery -pt is at a low to intermediate risk for surgery  2.  Hyperlipidemia-statin  3.  Tobacco use disorder-counseled, refused nicotine patch for now  4.  GERD-on PPI  5.  DVT prophylaxis-will be started after surgery ase discussed with Care Management/Social Worker. Management plans discussed with the patient, family and they are in agreement.  CODE STATUS: *full  DVT Prophylaxis: after surgery, cont SCD  TOTAL TIME TAKING CARE OF THIS  PATIENT: *30* minutes.  >50% time spent on counselling and coordination of care  POSSIBLE D/C IN 1-2* DAYS, DEPENDING ON CLINICAL CONDITION.  Note: This dictation was prepared with Dragon dictation along with smaller phrase technology. Any transcriptional errors that result from this process are unintentional.  Fritzi Mandes M.D on 05/16/2018 at 11:45 AM  Between 7am to 6pm - Pager - [phone]  After 6pm go to [email or web address] - password [email or web address] [facility]  Office  [phone]  CC: Primary care physician; Rusty Aus, MDPatient ID: [name], female   DOB: 1946-05-17, 72 y.o.   MRN: [id number]

## 2018-05-16 NOTE — Clinical Social Work Note (Signed)
Clinical Social Work Assessment  Patient Details  Name: Yolanda Jimenez MRN: [id number] Date of Birth: 1946/02/29  Date of referral:  05/16/18               Reason for consult:  Facility Placement                Permission sought to share information with:  Chartered certified accountant granted to share information::  Yes, Verbal Permission Granted  Name::      [place]::   [place]   Relationship::     Contact Information:     Housing/Transportation Living arrangements for the past 2 months:  [place] of Information:  Patient Patient Interpreter Needed:  None Criminal Activity/Legal Involvement Pertinent to Current Situation/Hospitalization:  No - Comment as needed Significant Relationships:  Adult Children Lives with:  Self Do you feel safe going back to the place where you live?  Yes Need for family participation in patient care:  Yes (Comment)  Care giving concerns:  Patient lives alone in [place].    Social Worker assessment / plan:  Holiday representative ([place]) reviewed chart and noted that patient has a hip fracture. Surgery and PT are pending. CSW met with patient today prior to surgery. Patient was alert and oriented X4 and was laying in the bed. CSW introduced self and explained role of CSW department. Per patient she lives alone in [place] and has 2 adult sons that live in [place]. Per patient she does not have a HPOA but she does have a living will. CSW explained that after surgery PT will evaluate patient and make a recommendation of home health or SNF. Per patient she prefers to go home and believes she will have to support to do so. Per patient if she has to go to SNF [facility] or [facility] is her preference. CSW explained that [facility] will have to approve SNF. Patient verbalized her understanding and is agreeable to SNF search in [place]. FL2 complete and faxed out. CSW will continue to follow and  assist as needed.        Employment status:  Retired Nurse, adult PT Recommendations:  Not assessed at this time Information / Referral to community resources:  [facility](SNF vs. HH)  Patient/Family's Response to care:  Patient prefers to D/C home however is agreeable to SNF search as a back up plan.   Patient/Family's Understanding of and Emotional Response to Diagnosis, Current Treatment, and Prognosis:  Patient was very pleasant and thanked CSW for assistance.   Emotional Assessment Appearance:  Appears stated age Attitude/Demeanor/Rapport:    Affect (typically observed):  Accepting, Adaptable, Pleasant Orientation:  Oriented to Self, Oriented to Place, Oriented to  Time, Oriented to Situation Alcohol / Substance use:  Not Applicable Psych involvement (Current and /or in the community):  No (Comment)  Discharge Needs  Concerns to be addressed:  Discharge Planning Concerns Readmission within the last 30 days:  No Current discharge risk:  Dependent with Mobility Barriers to Discharge:  Continued Medical Work up   [employer], Veronia Beets, LCSW 05/16/2018, 9:50 AM

## 2018-05-16 NOTE — Progress Notes (Signed)
Pt off floor to OR for surgery.  

## 2018-05-16 NOTE — Anesthesia Procedure Notes (Signed)
Spinal  Patient location during procedure: OR Start time: 05/16/2018 4:15 PM End time: 05/16/2018 4:40 PM Staffing Anesthesiologist: Martha Clan, MD Resident/CRNA: Dionne Bucy, CRNA Performed: anesthesiologist and resident/CRNA  Preanesthetic Checklist Completed: patient identified, site marked, surgical consent, pre-op evaluation, timeout performed, IV checked, risks and benefits discussed and monitors and equipment checked Spinal Block Patient position: sitting Prep: ChloraPrep Patient monitoring: heart rate, continuous pulse ox, blood pressure and cardiac monitor Approach: midline Location: L2-3 Injection technique: single-shot Needle Needle type: [place] and Introducer  Needle gauge: 24 G Needle length: 9 cm Assessment Sensory level: T10 Additional Notes Negative paresthesia. Negative blood return. Positive free-flowing CSF. Expiration date of kit checked and confirmed. Patient tolerated procedure well, without complications.

## 2018-05-16 NOTE — Plan of Care (Signed)
  Problem: Education: Goal: Knowledge of General Education information will improve Description Including pain rating scale, medication(s)/side effects and non-pharmacologic comfort measures Outcome: Progressing   

## 2018-05-16 NOTE — Clinical Social Work Placement (Signed)
   CLINICAL SOCIAL WORK PLACEMENT  NOTE  Date:  05/16/2018  Patient Details  Name: SAREENA ODEH MRN: [id number] Date of Birth: Jun 28, 1946  Clinical Social Work is seeking post-discharge placement for this patient at the [place] level of care (*CSW will initial, date and re-position this form in  chart as items are completed):  Yes   Patient/family provided with [place] Work Department's list of facilities offering this level of care within the geographic area requested by the patient (or if unable, by the patient's family).  Yes   Patient/family informed of their freedom to choose among providers that offer the needed level of care, that participate in Medicare, Medicaid or managed care program needed by the patient, have an available bed and are willing to accept the patient.  Yes   Patient/family informed of [facility]'s ownership interest in [facility] and [facility], as well as of the fact that they are under no obligation to receive care at these facilities.  PASRR submitted to EDS on 05/16/18     PASRR number received on 05/16/18     Existing PASRR number confirmed on       FL2 transmitted to all facilities in geographic area requested by pt/family on 05/16/18     FL2 transmitted to all facilities within larger geographic area on       Patient informed that his/her managed care company has contracts with or will negotiate with certain facilities, including the following:            Patient/family informed of bed offers received.  Patient chooses bed at       Physician recommends and patient chooses bed at      Patient to be transferred to   on  .  Patient to be transferred to facility by       Patient family notified on   of transfer.  Name of family member notified:        PHYSICIAN       Additional Comment:    _______________________________________________ Ashly Yepez, Veronia Beets, LCSW 05/16/2018, 9:49 AM

## 2018-05-16 NOTE — Transfer of Care (Signed)
Immediate Anesthesia Transfer of Care Note  Patient: Yolanda Jimenez  Procedure(s) Performed: ARTHROPLASTY BIPOLAR HIP (HEMIARTHROPLASTY) (Left Hip)  Patient Location: PACU  Anesthesia Type:Spinal  Level of Consciousness: awake and patient cooperative  Airway & Oxygen Therapy: Patient Spontanous Breathing and Patient connected to face mask oxygen  Post-op Assessment: Report given to RN and Post -op Vital signs reviewed and stable  Post vital signs: Reviewed and stable  Last Vitals:  Vitals Value Taken Time  BP 142/92 05/16/2018  6:18 PM  Temp 36.4 C 05/16/2018  6:18 PM  Pulse 75 05/16/2018  6:20 PM  Resp 13 05/16/2018  6:21 PM  SpO2 100 % 05/16/2018  6:20 PM  Vitals shown include unvalidated device data.  Last Pain:  Vitals:   05/16/18 1029  TempSrc:   PainSc: 4       Patients Stated Pain Goal: 1 (00/51/10 2111)  Complications: No apparent anesthesia complications

## 2018-05-16 NOTE — Plan of Care (Signed)

## 2018-05-16 NOTE — H&P (Signed)
H&P and my consult reviewed, and patient re-examined. No changes.

## 2018-05-16 NOTE — Anesthesia Preprocedure Evaluation (Signed)
Anesthesia Evaluation  Patient identified by MRN, date of birth, ID band Patient awake    Reviewed: Allergy & Precautions, H&P , NPO status , Patient's Chart, lab work & pertinent test results, reviewed documented beta blocker date and time   History of Anesthesia Complications Negative for: history of anesthetic complications  Airway Mallampati: III  TM Distance: >3 FB Neck ROM: full    Dental  (+) Partial Lower, Dental Advidsory Given, Poor Dentition   Pulmonary neg shortness of breath, neg COPD, neg recent URI, Current Smoker,           Cardiovascular Exercise Tolerance: Good (-) hypertension(-) angina+ Peripheral Vascular Disease  (-) CAD, (-) Past MI, (-) Cardiac Stents and (-) CABG (-) dysrhythmias + Valvular Problems/Murmurs      Neuro/Psych negative neurological ROS  negative psych ROS   GI/Hepatic Neg liver ROS, GERD  ,  Endo/Other  diabetes  Renal/GU negative Renal ROS  negative genitourinary   Musculoskeletal   Abdominal   Peds  Hematology  (+) Blood dyscrasia, anemia ,   Anesthesia Other Findings Past Medical History: No date: AVM (arteriovenous malformation) No date: Barrett esophagus No date: C. difficile diarrhea No date: Carotid stenosis No date: Diabetes mellitus without complication (HCC) No date: Diverticulosis No date: GERD (gastroesophageal reflux disease) No date: Heart murmur No date: Hx of adenomatous colonic polyps No date: Hyperlipidemia No date: IBS (irritable bowel syndrome) No date: Insomnia No date: Iron deficiency anemia No date: Kidney cysts     Comment:  hx UTIs No date: Microscopic hematuria No date: Osteoporosis     Comment:  mild No date: PVD (peripheral vascular disease) (HCC) No date: Wears dentures     Comment:  partial lower   Reproductive/Obstetrics negative OB ROS                             Anesthesia Physical Anesthesia  Plan  ASA: III  Anesthesia Plan: Spinal   Post-op Pain Management:    Induction:   PONV Risk Score and Plan: Propofol infusion and TIVA  Airway Management Planned:   Additional Equipment:   Intra-op Plan:   Post-operative Plan:   Informed Consent: I have reviewed the patients History and Physical, chart, labs and discussed the procedure including the risks, benefits and alternatives for the proposed anesthesia with the patient or authorized representative who has indicated his/her understanding and acceptance.   Dental Advisory Given  Plan Discussed with: Anesthesiologist, CRNA and Surgeon  Anesthesia Plan Comments:         Anesthesia Quick Evaluation

## 2018-05-16 NOTE — NC FL2 (Signed)
[place] LEVEL OF CARE SCREENING TOOL     IDENTIFICATION  Patient Name: Yolanda Jimenez Birthdate: [birth year] Sex: female Admission Date (Current Location): 05/15/2018  [place] and [place] Number:  Engineering geologist and Address:  [facility], [address] [place], [place] [address]      Provider Number: [id number]  Attending Physician Name and Address:  Fritzi Mandes, MD  Relative Name and Phone Number:       Current Level of Care: Hospital Recommended Level of Care: [place] Prior Approval Number:    Date Approved/Denied:   PASRR Number: [id number])  Discharge Plan: SNF    Current Diagnoses: Patient Active Problem List   Diagnosis Date Noted  . Hip fracture ([place]) 05/15/2018  . DM type 2 with diabetic mixed hyperlipidemia ([place]) 03/08/2018  . Low serum vitamin D 09/09/2017  . History of Barrett's esophagus   . Esophagitis, unspecified   . PAD (peripheral artery disease) ([place]) 10/15/2016  . Carotid stenosis 10/15/2016  . Pain in toe 09/17/2016  . Renal cyst, acquired, left 09/13/2016  . Diabetes mellitus, type 2 ([place]) 08/29/2014  . Combined fat and carbohydrate induced hyperlipemia 08/29/2014  . Incomplete emptying of bladder 07/24/2014  . Increased frequency of urination 03/19/2014  . Lower abdominal pain 03/02/2014  . Microscopic hematuria 03/01/2014  . Nocturia 03/01/2014  . Urinary tract infection, site not specified 03/01/2014  . Diarrhea 03/05/2013  . COLONIC POLYPS, ADENOMATOUS 09/05/2007  . Hyperlipidemia 09/05/2007  . IRON DEFICIENCY 09/05/2007  . BARRETTS ESOPHAGUS 09/05/2007  . DIVERTICULOSIS, COLON 09/05/2007  . IRRITABLE BOWEL SYNDROME 09/05/2007  . ARTERIOVENOUS MALFORMATION 09/05/2007    Orientation RESPIRATION BLADDER Height & Weight     Self, Time, Situation, Place  Normal Continent Weight: 105 lb (47.6 kg) Height:  5\' 2"  (157.5 cm)  BEHAVIORAL SYMPTOMS/MOOD NEUROLOGICAL BOWEL  NUTRITION STATUS      Continent Diet(Diet: NPO for surgery to be advanced. )  AMBULATORY STATUS COMMUNICATION OF NEEDS Skin   Extensive Assist Verbally Surgical wounds                       Personal Care Assistance Level of Assistance  Bathing, Feeding, Dressing Bathing Assistance: Limited assistance Feeding assistance: Independent Dressing Assistance: Limited assistance     Functional Limitations Info  Sight, Hearing, Speech Sight Info: Adequate Hearing Info: Adequate Speech Info: Adequate    SPECIAL CARE FACTORS FREQUENCY  PT (By licensed PT), OT (By licensed OT)     PT Frequency: (5) OT Frequency: (5)            Contractures      Additional Factors Info  Code Status, Allergies Code Status Info: (Full Code. ) Allergies Info: (Codeine, Hydrocodone-acetaminophen, Nsaids)           Current Medications (05/16/2018):  This is the current hospital active medication list Current Facility-Administered Medications  Medication Dose Route Frequency Provider Last Rate Last Dose  . 0.9 %  sodium chloride infusion   Intravenous Continuous Gladstone Lighter, MD 60 mL/hr at 05/15/18 1849    . acetaminophen (TYLENOL) tablet 650 mg  650 mg Oral Q6H PRN Gladstone Lighter, MD       Or  . acetaminophen (TYLENOL) suppository 650 mg  650 mg Rectal Q6H PRN Gladstone Lighter, MD      . ceFAZolin (ANCEF) IVPB 2g/100 mL premix  2 g Intravenous 30 min Pre-Op Poggi, Marshall Cork, MD      . cholecalciferol (  VITAMIN D) tablet 1,000 Units  1,000 Units Oral Daily Gladstone Lighter, MD      . docusate sodium (COLACE) capsule 100 mg  100 mg Oral BID Gladstone Lighter, MD   100 mg at 05/15/18 2239  . ferrous gluconate (FERGON) tablet 324 mg  324 mg Oral BID WC Gladstone Lighter, MD   324 mg at 05/15/18 2240  . magnesium oxide (MAG-OX) tablet 400 mg  400 mg Oral Daily Gladstone Lighter, MD      . morphine 2 MG/ML injection 2 mg  2 mg Intravenous Q4H PRN Gladstone Lighter, MD      .  multivitamin-lutein (OCUVITE-LUTEIN) capsule 1 capsule  1 capsule Oral Daily Gladstone Lighter, MD   1 capsule at 05/15/18 2240  . ondansetron (ZOFRAN) tablet 4 mg  4 mg Oral Q6H PRN Gladstone Lighter, MD       Or  . ondansetron (ZOFRAN) injection 4 mg  4 mg Intravenous Q6H PRN Gladstone Lighter, MD      . pantoprazole (PROTONIX) EC tablet 40 mg  40 mg Oral BID AC Kalisetti, Radhika, MD      . polyethylene glycol (MIRALAX / GLYCOLAX) packet 17 g  17 g Oral Daily PRN Gladstone Lighter, MD      . simvastatin (ZOCOR) tablet 40 mg  40 mg Oral QHS Gladstone Lighter, MD   40 mg at 05/15/18 2240  . temazepam (RESTORIL) capsule 15 mg  15 mg Oral QHS PRN Lance Coon, MD   15 mg at 05/15/18 2325  . traMADol (ULTRAM) tablet 50 mg  50 mg Oral Q6H PRN Gladstone Lighter, MD   50 mg at 05/16/18 0929  . vitamin C (ASCORBIC ACID) tablet 1,000 mg  1,000 mg Oral Daily Gladstone Lighter, MD         Discharge Medications: Please see discharge summary for a list of discharge medications.  Relevant Imaging Results:  Relevant Lab Results:   Additional Information (SSN: [id number])  Vianny Schraeder, Veronia Beets, LCSW

## 2018-05-16 NOTE — Op Note (Signed)
05/15/2018 - 05/16/2018  6:06 PM  Patient:   Yolanda Jimenez  Pre-Op Diagnosis:   Displaced femoral neck fracture, left hip.  Post-Op Diagnosis:   Same.  Procedure:   Left hip unipolar hemiarthroplasty.  Surgeon:   Pascal Lux, MD  Assistant:   Cameron Proud, PA-C  Anesthesia:   Spinal  Findings:   As above.  Complications:   None  EBL:   75 cc  Fluids:   1000 cc crystalloid  UOP:   500 cc  TT:   None  Drains:   None  Closure:   Staples  Implants:   Biomet press-fit system with a #11 standard offset reduced proximal profile Echo femoral stem, a 45 mm outer diameter shell, and a -3 mm neck adapter.  Brief Clinical Note:   The patient is a 72 year old female who sustained the above-noted injury yesterday when she tripped and fell while in a [employer]. She was brought to the emergency room where x-rays demonstrated the above-noted injury. The patient has been cleared medically and presents at this time for definitive management of the injury.  Procedure:   The patient was brought into the operating room. After adequate spinal anesthesia was obtained, a Foley was inserted before the patient was repositioned in the right lateral decubitus position and secured using a lateral hip positioner. The left hip and lower extremity were prepped with ChloroPrep solution before being draped sterilely. Preoperative antibiotics were administered. A timeout was performed to verify the appropriate surgical site before a standard posterior approach to the hip was made through an approximately 3.5-4 inch incision. The incision was carried down through the subcutaneous tissues to expose the gluteal fascia and proximal end of the iliotibial band. These structures were split the length of the incision and the Charnley self-retaining hip retractor placed. The bursal tissues were swept posteriorly to expose the short external rotators. The anterior border of the piriformis tendon was identified and this  plane developed down through the capsule to enter the joint. Abundant fracture hematoma was suctioned. A flap of tissue was elevated off the posterior aspect of the femoral neck and greater trochanter and retracted posteriorly. This flap included the piriformis tendon, the short external rotators, and the posterior capsule. The femoral head was removed in its entirety, then taken to the back table where it was measured and found to be optimally replicated by a 45 mm head. The appropriate trial head was inserted and found to demonstrate an excellent suction fit.   Attention was directed to the femoral side. The femoral neck was recut 10-12 mm above the lesser trochanter using an oscillating saw. The piriformis fossa was debrided of soft tissues before the intramedullary canal was accessed through this point using a triple step reamer. The canal was reamed sequentially beginning with a #7 tapered reamer and progressing to a #11 tapered reamer. This provided excellent circumferential chatter. A box osteotome was used to establish version before the canal was broached sequentially beginning with a #8 broach and progressing to a #11 broach. This was left in place and several trial reductions performed. The permanent ##11 standard offset reduced proximal profile femoral stem was impacted into place. A repeat trial reduction was performed using both the -6 mm and -3 mm neck lengths. The -3 mm neck length demonstrated excellent stability both in extension and external rotation as well as with flexion to 90 and internal rotation beyond 70. It also was stable in the position of sleep. The 45 mm  outer diameter shell with the -3 mm neck adapter construct was put together on the back table before being impacted onto the stem of the femoral component. The Morse taper locking mechanism was verified using manual distraction before the head was relocated and the hip placed through a range of motion with the findings as described  above.  The wound was copiously irrigated with bacitracin saline solution via the jet lavage system before the peri-incisional and pericapsular tissues were injected with 30 cc of 0.5% Sensorcaine with epinephrine and 20 cc of Exparel diluted out to 60 cc with normal saline to help with postoperative analgesia. The posterior flap was reapproximated to the posterior aspect of the greater trochanter using #2 Tycron interrupted sutures placed through drill holes. Several additional #2 Tycron interrupted sutures were used to reinforce this layer of closure. The iliotibial band was reapproximated using #1 Vicryl interrupted sutures before the gluteal fascia was closed using a running #1 Vicryl suture. At this point, 1 g of transexemic acid in 10 cc of normal saline was injected into the joint to help reduce postoperative bleeding. The subcutaneous tissues were closed in several layers using 2-0 Vicryl interrupted sutures before the skin was closed using staples. A sterile occlusive dressing was applied to the wound before the patient was placed into an abduction wedge pillow. The patient was then rolled back into the supine position on the hospital bed before being awakened and returned to the recovery room in satisfactory condition after tolerating the procedure well.

## 2018-05-17 ENCOUNTER — Inpatient Hospital Stay: Payer: Medicare Other

## 2018-05-17 ENCOUNTER — Encounter: Payer: Self-pay | Admitting: Surgery

## 2018-05-17 ENCOUNTER — Encounter
Admission: RE | Admit: 2018-05-17 | Discharge: 2018-05-17 | Disposition: A | Discharge status: Home / Self Care | Payer: Medicare Other | Source: Ambulatory Visit | Attending: Internal Medicine | Admitting: Internal Medicine

## 2018-05-17 DIAGNOSIS — Z96649 Presence of unspecified artificial hip joint: Secondary | ICD-10-CM | POA: Insufficient documentation

## 2018-05-17 LAB — CBC WITH DIFFERENTIAL/PLATELET
Abs Immature Granulocytes: 0.04 10*3/uL (ref 0.00–0.07)
Basophils Absolute: 0 10*3/uL (ref 0.0–0.1)
Basophils Relative: 0 %
Eosinophils Absolute: 0.1 10*3/uL (ref 0.0–0.5)
Eosinophils Relative: 1 %
HCT: 37.9 % (ref 36.0–46.0)
Hemoglobin: 13 g/dL (ref 12.0–15.0)
Immature Granulocytes: 0 %
Lymphocytes Relative: 12 %
Lymphs Abs: 1.3 10*3/uL (ref 0.7–4.0)
MCH: 33.1 pg (ref 26.0–34.0)
MCHC: 34.3 g/dL (ref 30.0–36.0)
MCV: 96.4 fL (ref 80.0–100.0)
Monocytes Absolute: 1.3 10*3/uL — ABNORMAL HIGH (ref 0.1–1.0)
Monocytes Relative: 12 %
Neutro Abs: 8.1 10*3/uL — ABNORMAL HIGH (ref 1.7–7.7)
Neutrophils Relative %: 75 %
Platelets: 238 10*3/uL (ref 150–400)
RBC: 3.93 MIL/uL (ref 3.87–5.11)
RDW: 13.4 % (ref 11.5–15.5)
WBC: 10.8 10*3/uL — ABNORMAL HIGH (ref 4.0–10.5)
nRBC: 0 % (ref 0.0–0.2)

## 2018-05-17 LAB — BASIC METABOLIC PANEL
Anion gap: 8 (ref 5–15)
BUN: 9 mg/dL (ref 8–23)
CO2: 24 mmol/L (ref 22–32)
Calcium: 8.1 mg/dL — ABNORMAL LOW (ref 8.9–10.3)
Chloride: 105 mmol/L (ref 98–111)
Creatinine, Ser: 0.64 mg/dL (ref 0.44–1.00)
GFR calc Af Amer: >60 mL/min (ref >60)
GFR calc non Af Amer: >60 mL/min (ref >60)
Glucose, Bld: 115 mg/dL — ABNORMAL HIGH (ref 70–99)
Potassium: 3.8 mmol/L (ref 3.5–5.1)
Sodium: 137 mmol/L (ref 135–145)

## 2018-05-17 NOTE — Care Management (Signed)
CM consult for discharge planning.  Patient from home. Fell at [employer] and fractured left hip.   Independent in all adls, no  issues accessing medical care, obtaining medications or with transportation.  Current with her PCP.  Hemiarthroplasty left hip 10/8.  Physical therapy and OT recommending skilled nursing facility placement at present time

## 2018-05-17 NOTE — Progress Notes (Signed)
Physical Therapy Evaluation Patient Details Name: Yolanda Jimenez MRN: [id number] DOB: January 23, 1946 Today's Date: 05/17/2018   History of Present Illness  Pt admitted for hip fx on 05/15/2018 with c/c of L hip pain. Imaging showed dislocated L femoral neck fx. On 05/16/2018 had L hip unipolar hemiarthroplasty procedure.   Clinical Impression  Pt is a pleasant 72 year old female who was admitted for L hip fx. Pt sitting at EOB on arrival attempting to get up from bed, unware of hip precautions s/p hemiarthroplasty procedure. PT educated pt on precautions, visual aid provided. Required increased time to educate. Also educated pt on HEP, visual aid provided. Pt performs transfers and ambulation with Min A and RW. Pt had increased pain with all activity. Pt demonstrates deficits with mobility, LLE strength, balance, activity tolerance, and safety awareness. Pt is not at her baseline. Would benefit from skilled PT to address above deficits and promote optimal return to PLOF.      Follow Up Recommendations SNF    Equipment Recommendations  Rolling walker with 5" wheels    Recommendations for Other Services       Precautions / Restrictions Precautions Precautions: Posterior Hip Precaution Booklet Issued: Yes (comment) Restrictions Weight Bearing Restrictions: Yes LLE Weight Bearing: Weight bearing as tolerated      Mobility  Bed Mobility               General bed mobility comments: Not tested pt sitting at EOB on arrival.  Transfers Overall transfer level: Needs assistance Equipment used: Rolling walker (2 wheeled) Transfers: Sit to/from Stand Sit to Stand: Min assist         General transfer comment: Uses RW for UE support, mild R lat trunk lean. Heavy VC used for sequencing and maintaining hip precautions.  Ambulation/Gait Ambulation/Gait assistance: Min assist Gait Distance (Feet): 12 Feet Assistive device: Rolling walker (2 wheeled) Gait Pattern/deviations: Step-to  pattern Gait velocity: decreased   General Gait Details: Pt had greatly varied step lengths in BLE, dec L heel contact, dec B foot clearance. Uses RW for UE support. Requires inc time and effort. Needs heavy cueing for safe RW use and to take appropriate length steps.  Stairs            Wheelchair Mobility    Modified Rankin (Stroke Patients Only)       Balance Overall balance assessment: Needs assistance Sitting-balance support: Feet supported;No upper extremity supported Sitting balance-Leahy Scale: Good     Standing balance support: Bilateral upper extremity supported Standing balance-Leahy Scale: Poor Standing balance comment: RW used for UE support.                             Pertinent Vitals/Pain Pain Assessment: 0-10 Pain Score: 3  Pain Location: L: dorsum of foot, posterior knee, anterior thigh Pain Descriptors / Indicators: Constant;Discomfort;Grimacing;Guarding;Sore Pain Intervention(s): Limited activity within patient's tolerance;Monitored during session(Pain increased with WBing to 5/10, subsides with rest.)    Home Living Family/patient expects to be discharged to:: Private residence Living Arrangements: Alone Available Help at Discharge: Friend(s);Neighbor;Available PRN/intermittently Type of Home: House Home Access: Level entry     Home Layout: One level Home Equipment: None      Prior Function Level of Independence: Independent         Comments: Per pt they were independent with all mobility, ADLs, and IADLs. Also drives independently.     Hand Dominance   Dominant Hand: Right  Extremity/Trunk Assessment   Upper Extremity Assessment Upper Extremity Assessment: Overall WFL for tasks assessed    Lower Extremity Assessment Lower Extremity Assessment: RLE deficits/detail;LLE deficits/detail RLE Deficits / Details: WFL of task assessed LLE Deficits / Details: 3/5       Communication   Communication: No difficulties   Cognition Arousal/Alertness: Awake/alert Behavior During Therapy: WFL for tasks assessed/performed Overall Cognitive Status: Within Functional Limits for tasks assessed                                        General Comments      Exercises Other Exercises Other Exercises: Seated: AROM LLE ankle pumps, quad sets, glute sets SLRs, hip ABD/ADD. All ther-ex performed 10 reps with VC for technique. Other Exercises: Pt education on hip precautions   Assessment/Plan    PT Assessment Patient needs continued PT services  PT Problem List Decreased strength;Decreased activity tolerance;Decreased balance;Decreased mobility;Decreased safety awareness;Decreased knowledge of use of DME;Decreased knowledge of precautions;Pain       PT Treatment Interventions DME instruction;Gait training;Functional mobility training;Therapeutic activities;Therapeutic exercise;Balance training;Neuromuscular re-education;Patient/family education    PT Goals (Current goals can be found in the Care Plan section)  Acute Rehab PT Goals Patient Stated Goal: To get back home and be independent again. PT Goal Formulation: With patient Time For Goal Achievement: 05/31/18 Potential to Achieve Goals: Good    Frequency BID   Barriers to discharge        Co-evaluation               AM-PAC PT "6 Clicks" Daily Activity  Outcome Measure Difficulty turning over in bed (including adjusting bedclothes, sheets and blankets)?: A Little Difficulty moving from lying on back to sitting on the side of the bed? : A Little Difficulty sitting down on and standing up from a chair with arms (e.g., wheelchair, bedside commode, etc,.)?: Unable Help needed moving to and from a bed to chair (including a wheelchair)?: A Little Help needed walking in hospital room?: A Little Help needed climbing 3-5 steps with a railing? : A Lot 6 Click Score: 15    End of Session Equipment Utilized During Treatment: Gait  belt Activity Tolerance: Patient limited by pain(Had increased pain with activity.) Patient left: in chair;with call bell/phone within reach;with nursing/sitter in room;with SCD's reapplied Nurse Communication: Mobility status;Other (comment)(Chair alarm not working.) PT Visit Diagnosis: Unsteadiness on feet (R26.81);Muscle weakness (generalized) (M62.81);Pain Pain - Right/Left: Left Pain - part of body: Hip;Knee;Leg;Ankle and joints of foot    Time: 1638-4665 PT Time Calculation (min) (ACUTE ONLY): 38 min   Charges:              Algis Downs, SPT   Algis Downs 05/17/2018, 12:28 PM

## 2018-05-17 NOTE — Progress Notes (Signed)
PT is recommending SNF. Clinical Education officer, museum (CSW) presented bed offers to patient and she chose [facility]. Patient prefers a private room but is okay with a semi-private room if that is all thats available. Per [facility] admissions coordinator at [facility] she will start [facility] SNF authorization.   [employer], LCSW [phone]

## 2018-05-17 NOTE — Progress Notes (Signed)
[facility] [place] NAME: Yolanda Jimenez    MR#:  [id number]  DATE OF BIRTH:  [birth year]  SUBJECTIVE:   POD-1 status post left hip fracture surgery Worked with physical therapy today.  No shortness of breath.  Pain well controlled. Complains of left foot pain REVIEW OF SYSTEMS:   Review of Systems  Constitutional: Negative for chills, fever and weight loss.  HENT: Negative for ear discharge, ear pain and nosebleeds.   Eyes: Negative for blurred vision, pain and discharge.  Respiratory: Negative for sputum production, shortness of breath, wheezing and stridor.   Cardiovascular: Negative for chest pain, palpitations, orthopnea and PND.  Gastrointestinal: Negative for abdominal pain, diarrhea, nausea and vomiting.  Genitourinary: Negative for frequency and urgency.  Musculoskeletal: Positive for back pain and joint pain.  Neurological: Negative for sensory change, speech change, focal weakness and weakness.  Psychiatric/Behavioral: Negative for depression and hallucinations. The patient is not nervous/anxious.     DRUG ALLERGIES:   Allergies  Allergen Reactions  . Codeine     "felt drunk, crazy"  . Hydrocodone-Acetaminophen Other (See Comments)    Confusion/delirium  . Nsaids Other (See Comments)    States it makes her fell drunk    VITALS:  Blood pressure 133/82, pulse 63, temperature 98.3 F (36.8 C), temperature source Oral, resp. rate 16, height 5\' 2"  (1.575 m), weight 47.6 kg, SpO2 96 %.  PHYSICAL EXAMINATION:   Physical Exam  GENERAL:  72 y.o.-year-old patient lying in the bed with no acute distress.  EYES: Pupils equal, round, reactive to light and accommodation. No scleral icterus. Extraocular muscles intact.  HEENT: Head atraumatic, normocephalic. Oropharynx and nasopharynx clear.  NECK:  Supple, no jugular venous distention. No thyroid enlargement, no tenderness.  LUNGS: Normal breath sounds bilaterally, no  wheezing, rales, rhonchi. No use of accessory muscles of respiration.  CARDIOVASCULAR: S1, S2 normal. No murmurs, rubs, or gallops.  ABDOMEN: Soft, nontender, nondistended. Bowel sounds present. No organomegaly or mass.  EXTREMITIES: No cyanosis, clubbing or edema b/l. Left lower extremity decreased range of motion secondary to fracture NEUROLOGIC: Cranial nerves II through XII are intact. No focal Motor or sensory deficits b/l.   PSYCHIATRIC:  patient is alert and oriented x 3.  SKIN: No obvious rash, lesion, or ulcer.   LABORATORY PANEL:  CBC Recent Labs  Lab 05/17/18 0345  WBC 10.8*  HGB 13.0  HCT 37.9  PLT 238    Chemistries  Recent Labs  Lab 05/15/18 1415  05/17/18 0345  NA 138   < > 137  K 4.3   < > 3.8  CL 105   < > 105  CO2 24   < > 24  GLUCOSE 121*   < > 115*  BUN 11   < > 9  CREATININE 0.57   < > 0.64  CALCIUM 9.1   < > 8.1*  AST 25  --   --   ALT 16  --   --   ALKPHOS 62  --   --   BILITOT 0.6  --   --    < > = values in this interval not displayed.   Cardiac Enzymes No results for input(s): TROPONINI in the last 168 hours. RADIOLOGY:  Dg Chest 1 View  Result Date: 05/15/2018 CLINICAL DATA:  Left shoulder pain following a fall. Smoker. EXAM: CHEST  1 VIEW COMPARISON:  06/12/2015. FINDINGS: Normal sized heart. Mildly tortuous and calcified thoracic aorta. Clear lungs.  The lungs are hyperexpanded with diffuse accentuation of the interstitial markings. Cholecystectomy clips. Mild scoliosis. Upper lumbar spine degenerative changes. IMPRESSION: No acute abnormality.  Mildly progressive changes of COPD. Electronically Signed   By: Claudie Revering M.D.   On: 05/15/2018 14:18   Dg Pelvis 1-2 Views  Result Date: 05/15/2018 CLINICAL DATA:  Proximal left leg pain following a fall. EXAM: PELVIS - 1-2 VIEW COMPARISON:  Left femur radiographs obtained at the same time. Abdomen and pelvis CT dated 04/20/2015. FINDINGS: Left femoral neck fracture with varus angulation and  proximal displacement of the distal fragment. No other fractures seen. Lower lumbar spine degenerative changes. Atheromatous arterial calcifications. IMPRESSION: Left femoral neck fracture, as described above. Electronically Signed   By: Claudie Revering M.D.   On: 05/15/2018 14:17   Dg Tibia/fibula Left  Result Date: 05/15/2018 CLINICAL DATA:  Left lower leg pain following a fall. EXAM: LEFT TIBIA AND FIBULA - 2 VIEW COMPARISON:  None. FINDINGS: Moderate inferior posterior calcaneal spur formation. No fracture or dislocation. Atheromatous arterial calcifications. IMPRESSION: 1. No fracture or dislocation. 2. Calcaneal spurs. Electronically Signed   By: Claudie Revering M.D.   On: 05/15/2018 14:15   Dg Hip Unilat W Or W/o Pelvis 2-3 Views Left  Result Date: 05/16/2018 CLINICAL DATA:  Status post left hip hemiarthroplasty. EXAM: DG HIP (WITH OR WITHOUT PELVIS) 2-3V LEFT COMPARISON:  Radiograph of May 15, 2018. FINDINGS: Left hip prosthesis appears to be well situated. No fracture or dislocation is noted. Expected postoperative changes are seen in the surrounding soft tissues. IMPRESSION: Status post left hip hemiarthroplasty. Electronically Signed   By: Marijo Conception, M.D.   On: 05/16/2018 20:43   Dg Femur Min 2 Views Left  Result Date: 05/15/2018 CLINICAL DATA:  Left leg pain following a fall. EXAM: LEFT FEMUR 2 VIEWS COMPARISON:  None. FINDINGS: Left femoral neck fracture with varus angulation and proximal displacement of the distal fragment. Atheromatous arterial calcifications. IMPRESSION: Left femoral neck fracture, as described above. Electronically Signed   By: Claudie Revering M.D.   On: 05/15/2018 14:15   ASSESSMENT AND PLAN:  Yolanda Jimenez  is a 72 y.o. female with a known history of IBS, history of colon polyps, iron deficiency anemia, osteoporosis, GERD, diet-controlled diabetes presents to hospital secondary to a fall and left hip pain.  1. Left femoral neck fracture-s/p surgery POD 1 - pain  control - PT  - monitor hb after surgery  2.  Hyperlipidemia-statin  3.  Tobacco use disorder-counseled, refused nicotine patch for now  4.  GERD-on PPI  5.  DVT prophylaxis Lovenox  6. Left foot pain and tender. Check xray  ase discussed with Care Management/Social Worker. Management plans discussed with the patient, family and they are in agreement.  CODE STATUS: Full  TOTAL TIME TAKING CARE OF THIS PATIENT: 30 minutes.   POSSIBLE D/C IN 1-2 DAYS, DEPENDING ON CLINICAL CONDITION.  Note: This dictation was prepared with Dragon dictation along with smaller phrase technology. Any transcriptional errors that result from this process are unintentional.  Neita Carp M.D on 05/17/2018 at 12:45 PM  Between 7am to 6pm - Pager - [phone]  After 6pm go to [email or web address] - password [email or web address] [facility]  Office  [phone]  CC: Primary care physician; Rusty Aus, MDPatient ID: [name], female   DOB: Nov 01, 1945, 72 y.o.   MRN: [id number]

## 2018-05-17 NOTE — Evaluation (Signed)
Occupational Therapy Evaluation Patient Details Name: Yolanda Jimenez MRN: [id number] DOB: Dec 08, 1945 Today's Date: 05/17/2018    History of Present Illness Pt admitted for hip fx on 05/15/2018 with c/c of L hip pain. Imaging showed dislocated L femoral neck fx. On 05/16/2018 had L hip unipolar hemiarthroplasty procedure.    Clinical Impression   Pt seen for OT evaluation this date, POD#1 from above surgery. Pt was independent in all ADLs prior to surgery, and no other falls aside from fall leading to this surgery. Pt is eager to return to PLOF with less pain and improved safety and independence. Pt currently requires MOD assist for LB dressing and bathing while in seated position due to pain and limited AROM of L hip and cues to maintain precautions. Pt able to recall 1/3 posterior total hip precautions at start of session and unable to verbalize how to implement during ADL and mobility. Pt instructed in posterior total hip precautions and how to implement, self care skills, falls prevention strategies, home/routines modifications, DME/AE for LB bathing and dressing tasks. Pt would benefit from additional instruction in self care skills and techniques to help maintain precautions with or without assistive devices to support recall and carryover prior to discharge. Recommend STR at discharge.    Follow Up Recommendations  SNF    Equipment Recommendations  3 in 1 bedside commode    Recommendations for Other Services       Precautions / Restrictions Precautions Precautions: Posterior Hip Precaution Booklet Issued: Yes (comment) Precaution Comments: Pt able to recall 1/3 precautions at start of session, with additional education/instruction with demo opportunities, pt able to recall 2/3 with no cues and the last with a visual cue Restrictions Weight Bearing Restrictions: Yes LLE Weight Bearing: Weight bearing as tolerated      Mobility Bed Mobility Overal bed mobility: Needs  Assistance Bed Mobility: Sit to Supine       Sit to supine: Mod assist   General bed mobility comments: BLE mgt back to bed, poor spatial awareness of her body in bed, requiring cues and assist to better align her   Transfers Overall transfer level: Needs assistance Equipment used: Rolling walker (2 wheeled) Transfers: Sit to/from Stand Sit to Stand: Min guard;Min assist         General transfer comment: VC for hand and foot placement to maintain precautions and for RW safety    Balance Overall balance assessment: Needs assistance Sitting-balance support: Feet supported;No upper extremity supported Sitting balance-Leahy Scale: Good     Standing balance support: Bilateral upper extremity supported Standing balance-Leahy Scale: Fair Standing balance comment: able to stand without UE support on the RW to apply hand sanitizer without LOB                           ADL either performed or assessed with clinical judgement   ADL Overall ADL's : Needs assistance/impaired     Grooming: Sitting;Independent   Upper Body Bathing: Sitting;Supervision/ safety   Lower Body Bathing: Sit to/from stand;Moderate assistance   Upper Body Dressing : Sitting;Supervision/safety   Lower Body Dressing: Sit to/from stand;Moderate assistance Lower Body Dressing Details (indicate cue type and reason): pt instructed in AE for LB dressing to maintain precautions Toilet Transfer: RW;Ambulation;BSC;Min guard;Cueing for Marketing executive Details (indicate cue type and reason): VC for sequencing to maintain precautions when turning to the L side with RW, BSC over toilet Toileting- Clothing Manipulation and Hygiene: Sitting/lateral lean;Supervision/safety  Functional mobility during ADLs: Min guard;Cueing for sequencing;Cueing for safety;Rolling walker       Vision Patient Visual Report: No change from baseline       Perception     Praxis      Pertinent  Vitals/Pain Pain Assessment: 0-10 Pain Score: 5  Pain Location: L: dorsum of foot, posterior knee, anterior thigh Pain Descriptors / Indicators: Constant;Discomfort;Grimacing;Guarding;Sore Pain Intervention(s): Limited activity within patient's tolerance;Monitored during session;Repositioned     Hand Dominance Right   Extremity/Trunk Assessment Upper Extremity Assessment Upper Extremity Assessment: Overall WFL for tasks assessed   Lower Extremity Assessment Lower Extremity Assessment: Defer to PT evaluation LLE Deficits / Details: 3/5   Cervical / Trunk Assessment Cervical / Trunk Assessment: Normal   Communication Communication Communication: No difficulties   Cognition Arousal/Alertness: Awake/alert Behavior During Therapy: WFL for tasks assessed/performed Overall Cognitive Status: Within Functional Limits for tasks assessed                                 General Comments: grossly WFL, impaired recall of precautions   General Comments       Exercises Other Exercises Other Exercises: pt instructed in precautions to support recall and carryover   Shoulder Instructions      Home Living Family/patient expects to be discharged to:: Private residence Living Arrangements: Alone Available Help at Discharge: Friend(s);Neighbor;Available PRN/intermittently Type of Home: House Home Access: Level entry     Home Layout: One level     Bathroom Shower/Tub: Teacher, early years/pre: Standard     Home Equipment: None          Prior Functioning/Environment Level of Independence: Independent        Comments: Per pt they were independent with all mobility, ADLs, and IADLs. Also drives independently.        OT Problem List: Decreased strength;Decreased knowledge of use of DME or AE;Decreased knowledge of precautions;Decreased range of motion;Impaired balance (sitting and/or standing);Decreased safety awareness;Pain      OT  Treatment/Interventions: Self-care/ADL training;Balance training;Therapeutic exercise;Therapeutic activities;DME and/or AE instruction;Patient/family education    OT Goals(Current goals can be found in the care plan section) Acute Rehab OT Goals Patient Stated Goal: To get back home and be independent again. OT Goal Formulation: With patient Time For Goal Achievement: 05/31/18 Potential to Achieve Goals: Good ADL Goals Pt Will Perform Lower Body Dressing: with min guard assist;sit to/from stand;with adaptive equipment Pt Will Transfer to Toilet: with supervision;ambulating(elevated commode, LRAD for amb) Additional ADL Goal #1: Pt will independently instruct family/caregiver in compression stocking mgt including wear schedule, donning/doffing, and positioning. Additional ADL Goal #2: Pt will independently verbalize 3/3 posterior total hip precautions and demo how to implement during 5/5 ADL/mobility tasks with PRN cues.  OT Frequency: Min 1X/week   Barriers to D/C: Decreased caregiver support          Co-evaluation              AM-PAC PT "6 Clicks" Daily Activity     Outcome Measure Help from another person eating meals?: None Help from another person taking care of personal grooming?: None Help from another person toileting, which includes using toliet, bedpan, or urinal?: A Little Help from another person bathing (including washing, rinsing, drying)?: A Lot Help from another person to put on and taking off regular upper body clothing?: None Help from another person to put on and taking off regular lower body  clothing?: A Lot 6 Click Score: 19   End of Session Equipment Utilized During Treatment: Gait belt;Rolling walker  Activity Tolerance: Patient tolerated treatment well Patient left: in bed;with call bell/phone within reach;with bed alarm set;with family/visitor present;Other (comment);with nursing/sitter in room(wedge pillow applied)  OT Visit Diagnosis: Other  abnormalities of gait and mobility (R26.89);Pain Pain - Right/Left: Left Pain - part of body: Hip;Knee;Leg                Time: 1028-9022 OT Time Calculation (min): 32 min Charges:  OT General Charges $OT Visit: 1 Visit OT Evaluation $OT Eval Low Complexity: 1 Low OT Treatments $Self Care/Home Management : 23-37 mins  Jeni Salles, MPH, MS, OTR/L ascom [phone] 05/17/18, 5:38 PM

## 2018-05-17 NOTE — Progress Notes (Signed)
Subjective: 1 Day Post-Op Procedure(s) (LRB): ARTHROPLASTY BIPOLAR HIP (HEMIARTHROPLASTY) (Left) Patient reports pain as 6 on 0-10 scale.   Patient is well, and has had no acute complaints or problems PT and care management to assist with discharge planning. Negative for chest pain and shortness of breath Fever: no Gastrointestinal:Negative for nausea and vomiting  Objective: Vital signs in last 24 hours: Temp:  [97.6 F (36.4 C)-98.7 F (37.1 C)] 98 F (36.7 C) (10/09 0514) Pulse Rate:  [61-95] 72 (10/09 0514) Resp:  [12-20] 14 (10/09 0514) BP: (113-142)/(68-95) 126/68 (10/09 0514) SpO2:  [93 %-100 %] 98 % (10/09 0514)  Intake/Output from previous day:  Intake/Output Summary (Last 24 hours) at 05/17/2018 0755 Last data filed at 05/17/2018 0320 Gross per 24 hour  Intake 1845.92 ml  Output 625 ml  Net 1220.92 ml    Intake/Output this shift: No intake/output data recorded.  Labs: Recent Labs    05/15/18 1415 05/16/18 0559 05/17/18 0345  HGB 15.3 14.3 13.0   Recent Labs    05/16/18 0559 05/17/18 0345  WBC 8.1 10.8*  RBC 4.28 3.93  HCT 42.7 37.9  PLT 240 238   Recent Labs    05/16/18 0559 05/17/18 0345  NA 137 137  K 3.7 3.8  CL 105 105  CO2 26 24  BUN 10 9  CREATININE 0.60 0.64  GLUCOSE 101* 115*  CALCIUM 8.3* 8.1*   No results for input(s): LABPT, INR in the last 72 hours.   EXAM General - Patient is Alert, Appropriate and Oriented Extremity - ABD soft Sensation intact distally Intact pulses distally Dorsiflexion/Plantar flexion intact Incision: dressing C/D/I No cellulitis present Dressing/Incision - clean, dry, no drainage Motor Function - intact, moving foot and toes well on exam.  Abdomen is soft with hyperactive bowel sounds, no distention.  Past Medical History:  Diagnosis Date  . AVM (arteriovenous malformation)   . Barrett esophagus   . C. difficile diarrhea   . Carotid stenosis   . Diabetes mellitus without complication ([place])    . Diverticulosis   . GERD (gastroesophageal reflux disease)   . Heart murmur   . Hx of adenomatous colonic polyps   . Hyperlipidemia   . IBS (irritable bowel syndrome)   . Insomnia   . Iron deficiency anemia   . Kidney cysts    hx UTIs  . Microscopic hematuria   . Osteoporosis    mild  . PVD (peripheral vascular disease) ([place])   . Wears dentures    partial lower    Assessment/Plan: 1 Day Post-Op Procedure(s) (LRB): ARTHROPLASTY BIPOLAR HIP (HEMIARTHROPLASTY) (Left) Active Problems:   Hip fracture (HCC)  Estimated body mass index is 19.2 kg/m as calculated from the following:   Height as of this encounter: 5\' 2"  (1.575 m).   Weight as of this encounter: 47.6 kg. Advance diet Up with therapy D/C IV fluids when tolerating po intake.  Labs reviewed this AM. Up with therapy today. Will hold on MoM due to history of IBS, active bowel sounds this AM. PT and Care management to assist with discharge planning.  Did have a long discussion on the use of Lovenox, patient has history of GI bleed due to AVMs in the past and was instructed to not take NSAIDs.  Informed her that while Lovenox is a blood thinner it is not an NSAID and it would be more beneficial to be on Lovenox vs. Aspirin which also has the potential to irritate her stomach.  Patient voiced her understanding  and consented to Lovenox injections.  DVT Prophylaxis - Lovenox, Foot Pumps and TED hose Weight-Bearing as tolerated to left leg  J. Cameron Proud, PA-C [facility] 05/17/2018, 7:55 AM

## 2018-05-17 NOTE — Plan of Care (Signed)
  Problem: Education: Goal: Knowledge of General Education information will improve Description Including pain rating scale, medication(s)/side effects and non-pharmacologic comfort measures Outcome: Progressing   

## 2018-05-17 NOTE — Anesthesia Postprocedure Evaluation (Signed)
Anesthesia Post Note  Patient: Yolanda Jimenez  Procedure(s) Performed: ARTHROPLASTY BIPOLAR HIP (HEMIARTHROPLASTY) (Left Hip)  Patient location during evaluation: Nursing Unit Anesthesia Type: Spinal Level of consciousness: awake, awake and alert and oriented Pain management: pain level controlled Vital Signs Assessment: post-procedure vital signs reviewed and stable Respiratory status: spontaneous breathing, nonlabored ventilation and respiratory function stable Cardiovascular status: blood pressure returned to baseline and stable Postop Assessment: no headache and no backache Anesthetic complications: no     Last Vitals:  Vitals:   05/17/18 0052 05/17/18 0514  BP: 118/70 126/68  Pulse: 64 72  Resp: 14 14  Temp: 36.9 C 36.7 C  SpO2: 94% 98%    Last Pain:  Vitals:   05/17/18 0755  TempSrc:   PainSc: 5                  [employer]

## 2018-05-17 NOTE — Progress Notes (Signed)
Physical Therapy Treatment Patient Details Name: Yolanda Jimenez MRN: [id number] DOB: 12/11/1945 Today's Date: 05/17/2018    History of Present Illness Pt admitted for hip fx on 05/15/2018 with c/c of L hip pain. Imaging showed dislocated L femoral neck fx. On 05/16/2018 had L hip unipolar hemiarthroplasty procedure.     PT Comments    Pt is making good progress towards goals with increased ambulation distance this session with improved quality of gait. Still needs to focus on correct sequencing and L turns as she still requires heavy verbal cues for hip precautions. Unable to recite hip precautions at this time despite thorough education. Good endurance with there-ex, however L LE remains weak. Continue to recommend discharge to SNF as pt is not at baseline level. Will continue to progress as able.   Follow Up Recommendations  SNF     Equipment Recommendations  Rolling walker with 5" wheels    Recommendations for Other Services       Precautions / Restrictions Precautions Precautions: Posterior Hip Precaution Booklet Issued: Yes (comment) Restrictions Weight Bearing Restrictions: Yes LLE Weight Bearing: Weight bearing as tolerated    Mobility  Bed Mobility Overal bed mobility: Needs Assistance Bed Mobility: Sit to Supine       Sit to supine: Mod assist   General bed mobility comments: needed assistance to return supine including raising B LEs up towards bed. Once in bed, pt with poor spatial awareness, needs cues for realigning in bed.  Transfers Overall transfer level: Needs assistance Equipment used: Rolling walker (2 wheeled) Transfers: Sit to/from Stand Sit to Stand: Min assist         General transfer comment: cues for pushing from seated surface. Once standing, pt able to demonstrate upright posture. Bed slightly elevated for ease of transiton.  Ambulation/Gait Ambulation/Gait assistance: Min assist Gait Distance (Feet): 50 Feet Assistive device: Rolling  walker (2 wheeled) Gait Pattern/deviations: Step-to pattern     General Gait Details: Pt ambulated with very short steps needing heavy cues for sequencing including maintaining hip precautions. Needs cues to keep RW further away from body and to demonstrate upright posture as she typically looks down at ground. Fatigues with increased distance   Stairs             Wheelchair Mobility    Modified Rankin (Stroke Patients Only)       Balance                                            Cognition Arousal/Alertness: Awake/alert Behavior During Therapy: WFL for tasks assessed/performed Overall Cognitive Status: Within Functional Limits for tasks assessed                                        Exercises Other Exercises Other Exercises: Seated ther-ex performed on L LE including ankle pumps, quad sets, glut sets, hip ab/add, and SAQ. All ther-ex performed x 10 reps with min assist for completion and cues for correct technique.    General Comments        Pertinent Vitals/Pain Pain Assessment: 0-10 Pain Score: 5  Pain Location: L: dorsum of foot, posterior knee, anterior thigh Pain Descriptors / Indicators: Constant;Discomfort;Grimacing;Guarding;Sore Pain Intervention(s): Limited activity within patient's tolerance    Home Living  Prior Function            PT Goals (current goals can now be found in the care plan section) Acute Rehab PT Goals Patient Stated Goal: To get back home and be independent again. PT Goal Formulation: With patient Time For Goal Achievement: 05/31/18 Potential to Achieve Goals: Good Progress towards PT goals: Progressing toward goals    Frequency    BID      PT Plan Current plan remains appropriate    Co-evaluation              AM-PAC PT "6 Clicks" Daily Activity  Outcome Measure  Difficulty turning over in bed (including adjusting bedclothes, sheets and  blankets)?: Unable Difficulty moving from lying on back to sitting on the side of the bed? : Unable Difficulty sitting down on and standing up from a chair with arms (e.g., wheelchair, bedside commode, etc,.)?: Unable Help needed moving to and from a bed to chair (including a wheelchair)?: A Little Help needed walking in hospital room?: A Little Help needed climbing 3-5 steps with a railing? : A Lot 6 Click Score: 11    End of Session Equipment Utilized During Treatment: Gait belt Activity Tolerance: Patient limited by fatigue Patient left: in bed;with bed alarm set;with family/visitor present(hip abduction wedge) Nurse Communication: Mobility status PT Visit Diagnosis: Unsteadiness on feet (R26.81);Muscle weakness (generalized) (M62.81);Pain Pain - Right/Left: Left Pain - part of body: Hip     Time: 8675-4492 PT Time Calculation (min) (ACUTE ONLY): 40 min  Charges:  $Gait Training: 23-37 mins $Therapeutic Exercise: 8-22 mins                     Greggory Stallion, PT, DPT [phone]    Amyrah Pinkhasov 05/17/2018, 4:47 PM

## 2018-05-18 ENCOUNTER — Other Ambulatory Visit: Payer: Self-pay

## 2018-05-18 LAB — BASIC METABOLIC PANEL
Anion gap: 7 (ref 5–15)
BUN: 7 mg/dL — ABNORMAL LOW (ref 8–23)
CO2: 28 mmol/L (ref 22–32)
Calcium: 8.1 mg/dL — ABNORMAL LOW (ref 8.9–10.3)
Chloride: 102 mmol/L (ref 98–111)
Creatinine, Ser: 0.55 mg/dL (ref 0.44–1.00)
GFR calc Af Amer: >60 mL/min (ref >60)
GFR calc non Af Amer: >60 mL/min (ref >60)
Glucose, Bld: 92 mg/dL (ref 70–99)
Potassium: 3.9 mmol/L (ref 3.5–5.1)
Sodium: 137 mmol/L (ref 135–145)

## 2018-05-18 LAB — CBC
HCT: 36.5 % (ref 36.0–46.0)
Hemoglobin: 11.9 g/dL — ABNORMAL LOW (ref 12.0–15.0)
MCH: 31.8 pg (ref 26.0–34.0)
MCHC: 32.6 g/dL (ref 30.0–36.0)
MCV: 97.6 fL (ref 80.0–100.0)
Platelets: 226 10*3/uL (ref 150–400)
RBC: 3.74 MIL/uL — ABNORMAL LOW (ref 3.87–5.11)
RDW: 13.4 % (ref 11.5–15.5)
WBC: 10.7 10*3/uL — ABNORMAL HIGH (ref 4.0–10.5)
nRBC: 0 % (ref 0.0–0.2)

## 2018-05-18 LAB — SURGICAL PATHOLOGY

## 2018-05-18 MED ORDER — ENOXAPARIN SODIUM 40 MG/0.4ML ~~LOC~~ SOLN: 40.0000 mg | SUBCUTANEOUS | Status: DC

## 2018-05-18 MED ORDER — OXYCODONE HCL 5 MG PO TABS: 5.0000 mg | ORAL_TABLET | Freq: Four times a day (QID) | ORAL | 0 refills | Status: DC | PRN

## 2018-05-18 MED ORDER — TEMAZEPAM 15 MG PO CAPS: 15.0000 mg | ORAL_CAPSULE | Freq: Every evening | ORAL | 0 refills | Status: DC | PRN

## 2018-05-18 MED ORDER — DIAZEPAM 5 MG PO TABS: 5.0000 mg | ORAL_TABLET | Freq: Every day | ORAL | 0 refills | Status: DC | PRN

## 2018-05-18 NOTE — Discharge Summary (Signed)
[facility] [place] NAME: Yolanda Jimenez    MR#:  [id number]  DATE OF BIRTH:  [birth year]  DATE OF ADMISSION:  05/15/2018 ADMITTING PHYSICIAN: Gladstone Lighter, MD  DATE OF DISCHARGE: 05/18/2018  PRIMARY CARE PHYSICIAN: Rusty Aus, MD   ADMISSION DIAGNOSIS:  Hip pain [M25.559] Fall, initial encounter [W19.XXXA] Closed displaced fracture of left femoral neck ([place]) [S72.002A]  DISCHARGE DIAGNOSIS:  Active Problems:   Hip fracture (HCC)   SECONDARY DIAGNOSIS:   Past Medical History:  Diagnosis Date  . AVM (arteriovenous malformation)   . Barrett esophagus   . C. difficile diarrhea   . Carotid stenosis   . Diabetes mellitus without complication ([place])   . Diverticulosis   . GERD (gastroesophageal reflux disease)   . Heart murmur   . Hx of adenomatous colonic polyps   . Hyperlipidemia   . IBS (irritable bowel syndrome)   . Insomnia   . Iron deficiency anemia   . Kidney cysts    hx UTIs  . Microscopic hematuria   . Osteoporosis    mild  . PVD (peripheral vascular disease) ([place])   . Wears dentures    partial lower     ADMITTING HISTORY  HISTORY OF PRESENT ILLNESS:  Yolanda Jimenez  is a 72 y.o. female with a known history of IBS, history of colon polyps, iron deficiency anemia, osteoporosis, GERD, diet-controlled diabetes presents to hospital secondary to a fall and left hip pain. Patient is independent and ambulatory at baseline.  She was walking at the shopping store [employer] today and accidentally tripped over a plastic wire coming out of box and landed on her left side.  She immediately felt the pain in her left hip and could not move.  EMS was called and was brought over to the emergency room.  X-ray indicating left femoral neck fracture.  Patient denies any cardiac history, denies any chest pain, palpitations or dyspnea.  She is otherwise active at baseline.  No recent nausea or vomiting.  Has GI symptoms secondary to her  IBS. Labs are within normal limits. Going to OR today.   HOSPITAL COURSE:   Yolanda Jimenez a71 y.o.femalewith a known history of IBS, history of colon polyps, iron deficiency anemia, osteoporosis, GERD, diet-controlled diabetes presents to hospital secondary to a fall and left hip pain.  1. Left femoral neck fracture-s/p surgery POD 2 - pain control - PT  - Hb stable SNF today for further rehab  Lovenox for DVT prophylaxis for 12 more days till 05/30/2018  2.Hyperlipidemia-statin  3. Tobacco use disorder-counseled, refused nicotine patch for now  4. GERD-on PPI  5. IBS. Has intermittent diarrhea  6. Left foot pain and tender. Xray with no fractures  stable for d/c to SNF  CONSULTS OBTAINED:  Treatment Team:  Corky Mull, MD  DRUG ALLERGIES:   Allergies  Allergen Reactions  . Codeine     "felt drunk, crazy"  . Hydrocodone-Acetaminophen Other (See Comments)    Confusion/delirium  . Nsaids Other (See Comments)    States it makes her fell drunk    DISCHARGE MEDICATIONS:   Allergies as of 05/18/2018      Reactions   Codeine    "felt drunk, crazy"   Hydrocodone-acetaminophen Other (See Comments)   Confusion/delirium   Nsaids Other (See Comments)   States it makes her fell drunk      Medication List    TAKE these medications   diazepam 5 MG tablet Commonly known  as:  VALIUM Take 5 mg by mouth daily as needed for anxiety.   enoxaparin 40 MG/0.4ML injection Commonly known as:  LOVENOX Inject 0.4 mLs (40 mg total) into the skin daily for 12 days. Start taking on:  05/19/2018   ferrous gluconate 240 (27 FE) MG tablet Commonly known as:  FERGON Take 240 mg by mouth daily.   loperamide 2 MG capsule Commonly known as:  IMODIUM Take 2 mg by mouth as needed for diarrhea or loose stools.   magnesium oxide 400 MG tablet Commonly known as:  MAG-OX Take 400 mg by mouth daily.   omeprazole 20 MG capsule Commonly known as:  PRILOSEC Take  2 tablets by mouth every morning (30 minutes before breakfast) and 1 tablet by mouth at bedtime.   oxyCODONE 5 MG immediate release tablet Commonly known as:  Oxy IR/ROXICODONE Take 1 tablet (5 mg total) by mouth every 6 (six) hours as needed for moderate pain or severe pain.   PRESERVISION AREDS 2 PO Take 1 tablet by mouth daily.   simvastatin 40 MG tablet Commonly known as:  ZOCOR Take 40 mg by mouth at bedtime.   temazepam 15 MG capsule Commonly known as:  RESTORIL Take 15 mg by mouth at bedtime as needed for sleep.   vitamin C 1000 MG tablet Take 1,000 mg by mouth daily.   Vitamin D 1000 units capsule Take 1,000 Units by mouth daily.       Today   VITAL SIGNS:  Blood pressure 119/68, pulse 77, temperature 98.2 F (36.8 C), temperature source Oral, resp. rate 15, height 5\' 2"  (1.575 m), weight 47.6 kg, SpO2 99 %.  I/O:    Intake/Output Summary (Last 24 hours) at 05/18/2018 1048 Last data filed at 05/18/2018 1024 Gross per 24 hour  Intake 340 ml  Output -  Net 340 ml    PHYSICAL EXAMINATION:  Physical Exam  GENERAL:  72 y.o.-year-old patient lying in the bed with no acute distress.  LUNGS: Normal breath sounds bilaterally, no wheezing, rales,rhonchi or crepitation. No use of accessory muscles of respiration.  CARDIOVASCULAR: S1, S2 normal. No murmurs, rubs, or gallops.  ABDOMEN: Soft, non-tender, non-distended. Bowel sounds present. No organomegaly or mass.  NEUROLOGIC: Moves all 4 extremities. PSYCHIATRIC: The patient is alert and oriented x 3.  SKIN: No obvious rash, lesion, or ulcer.   DATA REVIEW:   CBC Recent Labs  Lab 05/18/18 0515  WBC 10.7*  HGB 11.9*  HCT 36.5  PLT 226    Chemistries  Recent Labs  Lab 05/15/18 1415  05/18/18 0515  NA 138   < > 137  K 4.3   < > 3.9  CL 105   < > 102  CO2 24   < > 28  GLUCOSE 121*   < > 92  BUN 11   < > 7*  CREATININE 0.57   < > 0.55  CALCIUM 9.1   < > 8.1*  AST 25  --   --   ALT 16  --   --    ALKPHOS 62  --   --   BILITOT 0.6  --   --    < > = values in this interval not displayed.    Cardiac Enzymes No results for input(s): TROPONINI in the last 168 hours.  Microbiology Results  Results for orders placed or performed during the hospital encounter of 05/15/18  Surgical pcr screen     Status: None   Collection Time: 05/16/18  5:35 AM  Result Value Ref Range Status   MRSA, PCR NEGATIVE NEGATIVE Final   Staphylococcus aureus NEGATIVE NEGATIVE Final    Comment: (NOTE) The Xpert SA Assay (FDA approved for NASAL specimens in patients 33 years of age and older), is one component of a comprehensive surveillance program. It is not intended to diagnose infection nor to guide or monitor treatment. Performed at [facility], [address] [place], [place] [address]     RADIOLOGY:  Dg Foot Complete Left  Result Date: 05/17/2018 CLINICAL DATA:  Plantar arch foot pain. EXAM: LEFT FOOT - COMPLETE 3+ VIEW COMPARISON:  None. FINDINGS: No fracture.  No bone lesion. Joints are normally spaced and aligned.  No arthropathic changes. Moderate-sized plantar and small dorsal calcaneal spurs. Soft tissues are unremarkable. IMPRESSION: 1. No fracture, bone lesion or joint abnormality. 2. Calcaneal spurs. Electronically Signed   By: Lajean Manes M.D.   On: 05/17/2018 16:29   Dg Hip Unilat W Or W/o Pelvis 2-3 Views Left  Result Date: 05/16/2018 CLINICAL DATA:  Status post left hip hemiarthroplasty. EXAM: DG HIP (WITH OR WITHOUT PELVIS) 2-3V LEFT COMPARISON:  Radiograph of May 15, 2018. FINDINGS: Left hip prosthesis appears to be well situated. No fracture or dislocation is noted. Expected postoperative changes are seen in the surrounding soft tissues. IMPRESSION: Status post left hip hemiarthroplasty. Electronically Signed   By: Marijo Conception, M.D.   On: 05/16/2018 20:43    Follow up with PCP in 1 week.  Management plans discussed with the patient, family and they are in  agreement.  CODE STATUS:     Code Status Orders  (From admission, onward)         Start     Ordered   05/15/18 1723  Full code  Continuous     05/15/18 1722        Code Status History    This patient has a current code status but no historical code status.      TOTAL TIME TAKING CARE OF THIS PATIENT ON DAY OF DISCHARGE: more than 30 minutes.   Leia Alf Ebon Ketchum M.D on 05/18/2018 at 10:48 AM  Between 7am to 6pm - Pager - [phone]  After 6pm go to [email or web address] - password [email or web address] [facility]  Office  [phone]  CC: Primary care physician; Rusty Aus, MD  Note: This dictation was prepared with Dragon dictation along with smaller phrase technology. Any transcriptional errors that result from this process are unintentional.

## 2018-05-18 NOTE — Progress Notes (Signed)
Physical Therapy Treatment Patient Details Name: Yolanda Jimenez MRN: [id number] DOB: Apr 27, 1946 Today's Date: 05/18/2018    History of Present Illness Pt admitted for hip fx on 05/15/2018 with c/c of L hip pain. Imaging showed dislocated L femoral neck fx. On 05/16/2018 had L hip unipolar hemiarthroplasty procedure.     PT Comments    Pt continues to progress toward goals with increased awareness of hip precautions. At end of session after education and opportunities to practice pt can recall 3/3 hip precautions. Has improved endurance of ther-ex.  Pain limited ambulation distance today, slightly less VC used for RW, still requires heavy VC for sequencing turns particularly toward L. Continue to recommend dc to SNF as pt is not at baseline level. Will continue to progress strength/mobility as able.   Follow Up Recommendations  SNF     Equipment Recommendations  Rolling walker with 5" wheels    Recommendations for Other Services       Precautions / Restrictions Precautions Precautions: Posterior Hip Precaution Booklet Issued: Yes (comment) Precaution Comments: Pt able to recall 2/3 precautions at start of session, with visual cues could recall the third. At end of session after education and demo opportunities pt could recall 3/3. Restrictions Weight Bearing Restrictions: Yes LLE Weight Bearing: Weight bearing as tolerated    Mobility  Bed Mobility Overal bed mobility: Needs Assistance Bed Mobility: Sit to Supine       Sit to supine: Min assist   General bed mobility comments: LLE mgt off of bed, VC for sequencing to maintain hip precautions.  Transfers Overall transfer level: Needs assistance Equipment used: Rolling walker (2 wheeled) Transfers: Sit to/from Stand Sit to Stand: Min guard         General transfer comment: VC for hand and foot placement to maintain precautions and for RW safety  Ambulation/Gait Ambulation/Gait assistance: Min guard Gait Distance  (Feet): 50 Feet Assistive device: Rolling walker (2 wheeled) Gait Pattern/deviations: Step-to pattern Gait velocity: decreased   General Gait Details: Pt amb with dec step length required heavy VC for sequencing RW and maintain hip precautions. Continues to require VC to keep RW further away from her, but upright posturing has improved. Pt faituged after distance and states inc pain at hip.    Stairs             Wheelchair Mobility    Modified Rankin (Stroke Patients Only)       Balance Overall balance assessment: Needs assistance Sitting-balance support: Feet supported;No upper extremity supported Sitting balance-Leahy Scale: Good     Standing balance support: Bilateral upper extremity supported Standing balance-Leahy Scale: Fair Standing balance comment: Able to stand without UE support on RW for 10 sec without LOB. Longer duration UE support required.                            Cognition Arousal/Alertness: Awake/alert Behavior During Therapy: WFL for tasks assessed/performed Overall Cognitive Status: Within Functional Limits for tasks assessed                                 General Comments: Grossly WFL, impaired recall of precautions      Exercises Other Exercises Other Exercises: Pt education on hip precautions including positioning with pillows at home during sleep. Other Exercises: Supine AROM: ankle pumps, glute sets, and SAQs. AAROM with CGA SLRs and hip ABD/ADD. All ther-ex  performed 15 reps with moderate VC for technique.    General Comments        Pertinent Vitals/Pain Pain Assessment: 0-10 Pain Score: 5  Pain Location: L hip at surgical site. Pain Descriptors / Indicators: Sore;Aching;Discomfort;Operative site guarding;Grimacing Pain Intervention(s): Monitored during session;Limited activity within patient's tolerance    Home Living                      Prior Function            PT Goals (current goals  can now be found in the care plan section) Acute Rehab PT Goals Patient Stated Goal: To get back home and be independent again. PT Goal Formulation: With patient Time For Goal Achievement: 05/31/18 Potential to Achieve Goals: Good Progress towards PT goals: Progressing toward goals    Frequency    BID      PT Plan Current plan remains appropriate    Co-evaluation              AM-PAC PT "6 Clicks" Daily Activity  Outcome Measure  Difficulty turning over in bed (including adjusting bedclothes, sheets and blankets)?: Unable Difficulty moving from lying on back to sitting on the side of the bed? : Unable Difficulty sitting down on and standing up from a chair with arms (e.g., wheelchair, bedside commode, etc,.)?: Unable Help needed moving to and from a bed to chair (including a wheelchair)?: A Little Help needed walking in hospital room?: A Little Help needed climbing 3-5 steps with a railing? : A Lot 6 Click Score: 11    End of Session Equipment Utilized During Treatment: Gait belt Activity Tolerance: Patient limited by fatigue Patient left: in chair;with call bell/phone within reach;with chair alarm set;with family/visitor present;Other (comment)(Hip ABD pillow)   PT Visit Diagnosis: Unsteadiness on feet (R26.81);Muscle weakness (generalized) (M62.81);Pain Pain - Right/Left: Left Pain - part of body: Hip     Time: 7616-0737 PT Time Calculation (min) (ACUTE ONLY): 45 min  Charges:                         Algis Downs, SPT 05/18/2018, 12:25 PM

## 2018-05-18 NOTE — Progress Notes (Signed)
Report called to [facility]. EMS Called for transportation.

## 2018-05-18 NOTE — Discharge Instructions (Signed)
Full weight bearing

## 2018-05-18 NOTE — Progress Notes (Signed)
Patient is medically stable for D/C to [facility] today. Per [facility] admissions coordinator at [facility] SNF authorization has been received and patient can come today to room 208-B. RN will call report at [phone] and arrange EMS for transport. Clinical Education officer, museum (CSW) sent D/C orders to [employer] via [employer]. Patient is aware of above. CSW contacted patient's son Broadus John and made him aware of above. Please reconsult if future social work needs arise. CSW signing off.   [employer], LCSW [phone]

## 2018-05-18 NOTE — Clinical Social Work Placement (Signed)
   CLINICAL SOCIAL WORK PLACEMENT  NOTE  Date:  05/18/2018  Patient Details  Name: Yolanda Jimenez MRN: [id number] Date of Birth: [birth year]  Clinical Social Work is seeking post-discharge placement for this patient at the [place] level of care (*CSW will initial, date and re-position this form in  chart as items are completed):  Yes   Patient/family provided with [place] Work Department's list of facilities offering this level of care within the geographic area requested by the patient (or if unable, by the patient's family).  Yes   Patient/family informed of their freedom to choose among providers that offer the needed level of care, that participate in Medicare, Medicaid or managed care program needed by the patient, have an available bed and are willing to accept the patient.  Yes   Patient/family informed of [facility]'s ownership interest in [facility] and [facility], as well as of the fact that they are under no obligation to receive care at these facilities.  PASRR submitted to EDS on 05/16/18     PASRR number received on 05/16/18     Existing PASRR number confirmed on       FL2 transmitted to all facilities in geographic area requested by pt/family on 05/16/18     FL2 transmitted to all facilities within larger geographic area on       Patient informed that his/her managed care company has contracts with or will negotiate with certain facilities, including the following:        Yes   Patient/family informed of bed offers received.  Patient chooses bed at [facility] )     Physician recommends and patient chooses bed at      Patient to be transferred to [facility] ) on 05/18/18.  Patient to be transferred to facility by [facility] )     Patient family notified on 05/18/18 of transfer.  Name of family member notified:  (Patient's son Broadus John is aware of D/C today. )     PHYSICIAN       Additional  Comment:    _______________________________________________ Byren Pankow, Veronia Beets, LCSW 05/18/2018, 11:02 AM

## 2018-05-18 NOTE — Progress Notes (Signed)
Subjective: 2 Days Post-Op Procedure(s) (LRB): ARTHROPLASTY BIPOLAR HIP (HEMIARTHROPLASTY) (Left) Patient reports pain as 6 on 0-10 scale.   More pain today following sessions with PT yesterday. Patient is well, and has had no acute complaints or problems Current plan will be for discharge to SNF when medically appropriate. Negative for chest pain and shortness of breath Fever: no Gastrointestinal:Negative for nausea and vomiting  Objective: Vital signs in last 24 hours: Temp:  [98.3 F (36.8 C)-98.6 F (37 C)] 98.6 F (37 C) (10/10 0003) Pulse Rate:  [63-74] 74 (10/10 0003) Resp:  [14-16] 15 (10/10 0003) BP: (125-143)/(70-82) 125/72 (10/10 0003) SpO2:  [94 %-97 %] 97 % (10/10 0003)  Intake/Output from previous day:  Intake/Output Summary (Last 24 hours) at 05/18/2018 0737 Last data filed at 05/17/2018 1900 Gross per 24 hour  Intake 220 ml  Output -  Net 220 ml    Intake/Output this shift: No intake/output data recorded.  Labs: Recent Labs    05/15/18 1415 05/16/18 0559 05/17/18 0345 05/18/18 0515  HGB 15.3 14.3 13.0 11.9*   Recent Labs    05/17/18 0345 05/18/18 0515  WBC 10.8* 10.7*  RBC 3.93 3.74*  HCT 37.9 36.5  PLT 238 226   Recent Labs    05/17/18 0345 05/18/18 0515  NA 137 137  K 3.8 3.9  CL 105 102  CO2 24 28  BUN 9 7*  CREATININE 0.64 0.55  GLUCOSE 115* 92  CALCIUM 8.1* 8.1*   No results for input(s): LABPT, INR in the last 72 hours.   EXAM General - Patient is Alert, Appropriate and Oriented Extremity - ABD soft Sensation intact distally Intact pulses distally Dorsiflexion/Plantar flexion intact Incision: dressing C/D/I No cellulitis present Dressing/Incision - clean, dry, no drainage Motor Function - intact, moving foot and toes well on exam.  Abdomen is soft with active bowel sounds, no distention.  Past Medical History:  Diagnosis Date  . AVM (arteriovenous malformation)   . Barrett esophagus   . C. difficile diarrhea   .  Carotid stenosis   . Diabetes mellitus without complication ([place])   . Diverticulosis   . GERD (gastroesophageal reflux disease)   . Heart murmur   . Hx of adenomatous colonic polyps   . Hyperlipidemia   . IBS (irritable bowel syndrome)   . Insomnia   . Iron deficiency anemia   . Kidney cysts    hx UTIs  . Microscopic hematuria   . Osteoporosis    mild  . PVD (peripheral vascular disease) ([place])   . Wears dentures    partial lower    Assessment/Plan: 2 Days Post-Op Procedure(s) (LRB): ARTHROPLASTY BIPOLAR HIP (HEMIARTHROPLASTY) (Left) Active Problems:   Hip fracture (HCC)  Estimated body mass index is 19.2 kg/m as calculated from the following:   Height as of this encounter: 5\' 2"  (1.575 m).   Weight as of this encounter: 47.6 kg. Advance diet Up with therapy D/C IV fluids when tolerating po intake.  Labs reviewed this AM. Up with therapy today.  Current plan is for d/c to SNF. Patient has history of IBS, will continue to monitor for BM today, if no BM this AM can move on to gentle dose of MoM. No abdominal pain this AM. CBC and BMP ordered for tomorrow morning.  DVT Prophylaxis - Lovenox, Foot Pumps and TED hose Weight-Bearing as tolerated to left leg  J. Cameron Proud, PA-C [facility] 05/18/2018, 7:37 AM

## 2018-05-18 NOTE — Telephone Encounter (Signed)
Rx sent to [facility] phone : [phone] , fax : [phone]  

## 2018-05-18 NOTE — Care Management Important Message (Signed)
Important Message  Patient Details  Name: Yolanda Jimenez MRN: [id number] Date of Birth: [birth year]   Medicare Important Message Given:  Yes    [name] A Lolita Faulds 05/18/2018, 10:49 AM

## 2018-05-22 ENCOUNTER — Non-Acute Institutional Stay (SKILLED_NURSING_FACILITY): Payer: Medicare Other | Admitting: Adult Health

## 2018-05-22 ENCOUNTER — Encounter: Payer: Self-pay | Admitting: Adult Health

## 2018-05-22 DIAGNOSIS — L03116 Cellulitis of left lower limb: Secondary | ICD-10-CM | POA: Diagnosis not present

## 2018-05-22 NOTE — Progress Notes (Signed)
[facility] Room Number: [place] of Service:  SNF (31)   CODE STATUS: Full Code  Allergies  Allergen Reactions  . Codeine     "felt drunk, crazy"  . Hydrocodone-Acetaminophen Other (See Comments)    Confusion/delirium  . Nsaids Other (See Comments)    States it makes her fell drunk    Chief Complaint  Patient presents with  . Acute Visit    left hip cellulitis     HPI:  Staff called me last night concerned about her left hip incision line. There was concern for infection as the ipo was red hip; inflamed and tender to touch. There are no reports of fever present. She tells me that her hip is looking better today; is not was red or tender.   Past Medical History:  Diagnosis Date  . AVM (arteriovenous malformation)   . Barrett esophagus   . C. difficile diarrhea   . Carotid stenosis   . Diabetes mellitus without complication ([place])   . Diverticulosis   . GERD (gastroesophageal reflux disease)   . Heart murmur   . Hx of adenomatous colonic polyps   . Hyperlipidemia   . IBS (irritable bowel syndrome)   . Insomnia   . Iron deficiency anemia   . Kidney cysts    hx UTIs  . Microscopic hematuria   . Osteoporosis    mild  . PVD (peripheral vascular disease) ([place])   . Wears dentures    partial lower    Past Surgical History:  Procedure Laterality Date  . BUNIONECTOMY     right foot  . CATARACT EXTRACTION     both eyes  . CERVICAL CONE BIOPSY    . CHOLECYSTECTOMY    . COLONOSCOPY    . ESOPHAGOGASTRODUODENOSCOPY (EGD) WITH PROPOFOL N/A 08/29/2017   Procedure: ESOPHAGOGASTRODUODENOSCOPY (EGD) WITH PROPOFOL;  Surgeon: Lucilla Lame, MD;  Location: [place];  Service: Endoscopy;  Laterality: N/A;  . FOOT SURGERY     right 2nd toe  . HIP ARTHROPLASTY Left 05/16/2018   Procedure: ARTHROPLASTY BIPOLAR HIP (HEMIARTHROPLASTY);  Surgeon: Corky Mull, MD;  Location: [facility];  Service: Orthopedics;  Laterality: Left;  .  TUBAL LIGATION    . UPPER GASTROINTESTINAL ENDOSCOPY      Social History   Socioeconomic History  . Marital status: Single    Spouse name: Not on file  . Number of children: 2  . Years of education: Not on file  . Highest education level: Not on file  Occupational History  . Occupation: Retired  Scientific laboratory technician  . Financial resource strain: Not on file  . Food insecurity:    Worry: Not on file    Inability: Not on file  . Transportation needs:    Medical: Not on file    Non-medical: Not on file  Tobacco Use  . Smoking status: Current Every Day Smoker    Packs/day: 0.50    Years: 55.00    Pack years: 27.50    Types: Cigarettes  . Smokeless tobacco: Never Used  . Tobacco comment: she has patches to start just not started yet. smoked since teenager.  Substance and Sexual Activity  . Alcohol use: Yes    Alcohol/week: 2.0 standard drinks    Types: 2 Glasses of wine per week    Comment: socially  . Drug use: No  . Sexual activity: Not on file  Lifestyle  . Physical activity:    Days per  week: Not on file    Minutes per session: Not on file  . Stress: Not on file  Relationships  . Social connections:    Talks on phone: Not on file    Gets together: Not on file    Attends religious service: Not on file    Active member of club or organization: Not on file    Attends meetings of clubs or organizations: Not on file    Relationship status: Not on file  . Intimate partner violence:    Fear of current or ex partner: Not on file    Emotionally abused: Not on file    Physically abused: Not on file    Forced sexual activity: Not on file  Other Topics Concern  . Not on file  Social History Narrative   Independent at baseline.  Lives at home with her husband   Family History  Problem Relation Age of Onset  . Diabetes Mother   . Heart disease Mother   . Hypertension Mother   . Diabetes Son   . Diabetes Sister   . Inflammatory bowel disease Father        diverticulitis    . Multiple myeloma Father   . Hypertension Son   . Diabetes Maternal Grandmother   . Colon cancer Neg Hx   . Esophageal cancer Neg Hx   . Rectal cancer Neg Hx   . Stomach cancer Neg Hx   . Breast cancer Neg Hx       VITAL SIGNS BP 139/65   Pulse 63   Temp 98.6 F (37 C)   Resp 20   Ht 5' 2"  (1.575 m)   Wt 107 lb 12.8 oz (48.9 kg)   SpO2 98%   BMI 19.72 kg/m   Outpatient Encounter Medications as of 05/22/2018  Medication Sig  . acetaminophen (TYLENOL) 500 MG tablet Take 1,000 mg by mouth every 6 (six) hours as needed.  . Ascorbic Acid (VITAMIN C) 1000 MG tablet Take 1,000 mg by mouth daily.    . [EXPIRED] cephALEXin (KEFLEX) 500 MG capsule Take 500 mg by mouth every 8 (eight) hours.  . Cholecalciferol (VITAMIN D) 1000 UNITS capsule Take 1,000 Units by mouth daily.    [name] enoxaparin (LOVENOX) 40 MG/0.4ML injection Inject 0.4 mLs (40 mg total) into the skin daily for 12 days.  . ferrous gluconate (FERGON) 240 (27 FE) MG tablet Take 240 mg by mouth daily.   [name] loperamide (IMODIUM) 2 MG capsule Take 2 mg by mouth as needed for diarrhea or loose stools.  . magnesium oxide (MAG-OX) 400 MG tablet Take 400 mg by mouth daily.  . Multiple Vitamins-Minerals (PRESERVISION AREDS 2 PO) Take 1 tablet by mouth daily.  . NON FORMULARY Diet Type:  NCS  . omeprazole (PRILOSEC) 20 MG capsule Take 2 tablets by mouth every morning (30 minutes before breakfast) and 1 tablet by mouth at bedtime.  . [DISCONTINUED] diazepam (VALIUM) 5 MG tablet Take 1 tablet (5 mg total) by mouth daily as needed for anxiety.  . [DISCONTINUED] temazepam (RESTORIL) 15 MG capsule Take 1 capsule (15 mg total) by mouth at bedtime as needed for sleep.  . simvastatin (ZOCOR) 40 MG tablet Take 40 mg by mouth at bedtime.    . [DISCONTINUED] oxyCODONE (OXY IR/ROXICODONE) 5 MG immediate release tablet Take 1 tablet (5 mg total) by mouth every 6 (six) hours as needed for moderate pain or severe pain. (Patient not taking: Reported on  05/22/2018)   No facility-administered  encounter medications on file as of 05/22/2018.      SIGNIFICANT DIAGNOSTIC EXAMS  LABS REVIEWED TODAY:   05-18-18: wbc 10.7; hgb 11.9; hct 36.5; mcv 97.6; plt 226; glucose 92; bun 7; creat 0.55; k+ 3.9; na++ 137; ca 8.1    Review of Systems  Constitutional: Negative for malaise/fatigue.  Respiratory: Negative for cough and shortness of breath.   Cardiovascular: Negative for chest pain, palpitations and leg swelling.  Gastrointestinal: Negative for abdominal pain, constipation and heartburn.  Musculoskeletal: Positive for joint pain. Negative for back pain and myalgias.       Left hip pain is managed   Skin:       Left hip incision line is red and hot   Neurological: Negative for dizziness.  Psychiatric/Behavioral: The patient is not nervous/anxious.   '  Physical Exam  Constitutional: She is oriented to person, place, and time. She appears well-developed and well-nourished. No distress.  Neck: No thyromegaly present.  Cardiovascular: Normal rate, regular rhythm, normal heart sounds and intact distal pulses.  Pulmonary/Chest: Effort normal and breath sounds normal. No respiratory distress.  Abdominal: Soft. Bowel sounds are normal. She exhibits no distension. There is no tenderness.  Musculoskeletal: She exhibits no edema.  Is able to move all extremities Status post left hip arthroplasty   Lymphadenopathy:    She has no cervical adenopathy.  Neurological: She is alert and oriented to person, place, and time.  Skin: Skin is warm and dry. She is not diaphoretic.  Left hip incision line: red hot inflamed. Scant drainage present   Psychiatric: She has a normal mood and affect.      ASSESSMENT/ PLAN:  TODAY;   1. Left hip cellulitis: will complete keflex 500 mg three times daily and will monitor her status.   MD is aware of resident's narcotic use and is in agreement with current plan of care. We will attempt to wean resident as  apropriate   Ok Edwards NP [facility]  Contact [phone] Monday through Friday 8am- 5pm  After hours call [phone]

## 2018-05-24 ENCOUNTER — Encounter: Payer: Self-pay | Admitting: Adult Health

## 2018-05-24 ENCOUNTER — Other Ambulatory Visit: Payer: Self-pay

## 2018-05-24 ENCOUNTER — Non-Acute Institutional Stay (SKILLED_NURSING_FACILITY): Payer: Medicare Other | Admitting: Adult Health

## 2018-05-24 DIAGNOSIS — S72002S Fracture of unspecified part of neck of left femur, sequela: Secondary | ICD-10-CM

## 2018-05-24 DIAGNOSIS — I739 Peripheral vascular disease, unspecified: Secondary | ICD-10-CM | POA: Diagnosis not present

## 2018-05-24 DIAGNOSIS — E1169 Type 2 diabetes mellitus with other specified complication: Secondary | ICD-10-CM

## 2018-05-24 DIAGNOSIS — E782 Mixed hyperlipidemia: Secondary | ICD-10-CM

## 2018-05-24 MED ORDER — TEMAZEPAM 15 MG PO CAPS: 15.0000 mg | ORAL_CAPSULE | Freq: Every evening | ORAL | 0 refills | Status: DC | PRN

## 2018-05-24 MED ORDER — DIAZEPAM 5 MG PO TABS: 5.0000 mg | ORAL_TABLET | Freq: Every day | ORAL | 0 refills | Status: DC | PRN

## 2018-05-24 NOTE — Progress Notes (Signed)
[facility] Room Number: [place] of Service:  SNF (31)    CODE STATUS: Full Code  Allergies  Allergen Reactions  . Codeine     "felt drunk, crazy"  . Hydrocodone-Acetaminophen Other (See Comments)    Confusion/delirium  . Nsaids Other (See Comments)    States it makes her fell drunk    Chief Complaint  Patient presents with  . Discharge Note    Discharging on 05/27/18    HPI:  She is being discharged to home with home health for pt/ot. She will not need any dme. She will need her prescriptions written and will need to follow up with her medical provider. She had been hospitalized for a closed displaced fracture of left femoral neck. She was admitted to this facility for short term rehab and is now ready to be discharged to home.    Past Medical History:  Diagnosis Date  . AVM (arteriovenous malformation)   . Barrett esophagus   . C. difficile diarrhea   . Carotid stenosis   . Diabetes mellitus without complication ([place])   . Diverticulosis   . GERD (gastroesophageal reflux disease)   . Heart murmur   . Hx of adenomatous colonic polyps   . Hyperlipidemia   . IBS (irritable bowel syndrome)   . Insomnia   . Iron deficiency anemia   . Kidney cysts    hx UTIs  . Microscopic hematuria   . Osteoporosis    mild  . PVD (peripheral vascular disease) ([place])   . Wears dentures    partial lower    Past Surgical History:  Procedure Laterality Date  . BUNIONECTOMY     right foot  . CATARACT EXTRACTION     both eyes  . CERVICAL CONE BIOPSY    . CHOLECYSTECTOMY    . COLONOSCOPY    . ESOPHAGOGASTRODUODENOSCOPY (EGD) WITH PROPOFOL N/A 08/29/2017   Procedure: ESOPHAGOGASTRODUODENOSCOPY (EGD) WITH PROPOFOL;  Surgeon: Lucilla Lame, MD;  Location: [place];  Service: Endoscopy;  Laterality: N/A;  . FOOT SURGERY     right 2nd toe  . HIP ARTHROPLASTY Left 05/16/2018   Procedure: ARTHROPLASTY BIPOLAR HIP (HEMIARTHROPLASTY);   Surgeon: Corky Mull, MD;  Location: [facility];  Service: Orthopedics;  Laterality: Left;  . TUBAL LIGATION    . UPPER GASTROINTESTINAL ENDOSCOPY      Social History   Socioeconomic History  . Marital status: Single    Spouse name: Not on file  . Number of children: 2  . Years of education: Not on file  . Highest education level: Not on file  Occupational History  . Occupation: Retired  Scientific laboratory technician  . Financial resource strain: Not on file  . Food insecurity:    Worry: Not on file    Inability: Not on file  . Transportation needs:    Medical: Not on file    Non-medical: Not on file  Tobacco Use  . Smoking status: Current Every Day Smoker    Packs/day: 0.50    Years: 55.00    Pack years: 27.50    Types: Cigarettes  . Smokeless tobacco: Never Used  . Tobacco comment: she has patches to start just not started yet. smoked since teenager.  Substance and Sexual Activity  . Alcohol use: Yes    Alcohol/week: 2.0 standard drinks    Types: 2 Glasses of wine per week    Comment: socially  . Drug use: No  . Sexual  activity: Not on file  Lifestyle  . Physical activity:    Days per week: Not on file    Minutes per session: Not on file  . Stress: Not on file  Relationships  . Social connections:    Talks on phone: Not on file    Gets together: Not on file    Attends religious service: Not on file    Active member of club or organization: Not on file    Attends meetings of clubs or organizations: Not on file    Relationship status: Not on file  . Intimate partner violence:    Fear of current or ex partner: Not on file    Emotionally abused: Not on file    Physically abused: Not on file    Forced sexual activity: Not on file  Other Topics Concern  . Not on file  Social History Narrative   Independent at baseline.  Lives at home with her husband   Family History  Problem Relation Age of Onset  . Diabetes Mother   . Heart disease Mother   . Hypertension Mother   .  Diabetes Son   . Diabetes Sister   . Inflammatory bowel disease Father        diverticulitis  . Multiple myeloma Father   . Hypertension Son   . Diabetes Maternal Grandmother   . Colon cancer Neg Hx   . Esophageal cancer Neg Hx   . Rectal cancer Neg Hx   . Stomach cancer Neg Hx   . Breast cancer Neg Hx     VITAL SIGNS BP 109/77   Pulse 67   Temp 97.6 F (36.4 C)   Resp 16   Ht _0  (1.575 m)   Wt 107 lb 12.8 oz (48.9 kg)   SpO2 100%   BMI 19.72 kg/m   Patient's Medications  New Prescriptions   No medications on file  Previous Medications   ACETAMINOPHEN (TYLENOL) 500 MG TABLET    Take 1,000 mg by mouth every 6 (six) hours as needed.   ASCORBIC ACID (VITAMIN C) 1000 MG TABLET    Take 1,000 mg by mouth daily.     CEPHALEXIN (KEFLEX) 500 MG CAPSULE    Take 500 mg by mouth every 8 (eight) hours.   CHOLECALCIFEROL (VITAMIN D) 1000 UNITS CAPSULE    Take 1,000 Units by mouth daily.     DIAZEPAM (VALIUM) 5 MG TABLET    Take 1 tablet (5 mg total) by mouth daily as needed for anxiety.   ENOXAPARIN (LOVENOX) 40 MG/0.4ML INJECTION    Inject 0.4 mLs (40 mg total) into the skin daily for 12 days.   FERROUS GLUCONATE (FERGON) 240 (27 FE) MG TABLET    Take 240 mg by mouth daily.    LOPERAMIDE (IMODIUM) 2 MG CAPSULE    Take 2 mg by mouth as needed for diarrhea or loose stools.   MAGNESIUM OXIDE (MAG-OX) 400 MG TABLET    Take 400 mg by mouth daily.   MULTIPLE VITAMINS-MINERALS (PRESERVISION AREDS 2 PO)    Take 1 tablet by mouth daily.   NON FORMULARY    Diet Type:  NCS   OMEPRAZOLE (PRILOSEC) 20 MG CAPSULE    Take 2 tablets by mouth every morning (30 minutes before breakfast) and 1 tablet by mouth at bedtime.   PROBIOTIC PRODUCT (RISA-BID PROBIOTIC) TABS    Take 1 tablet by mouth 2 (two) times daily.   SIMVASTATIN (ZOCOR) 40 MG TABLET  Take 40 mg by mouth at bedtime.     TEMAZEPAM (RESTORIL) 15 MG CAPSULE    Take 1 capsule (15 mg total) by mouth at bedtime as needed for sleep.  Modified  Medications   No medications on file  Discontinued Medications   No medications on file     SIGNIFICANT DIAGNOSTIC EXAMS   LABS REVIEWED PREVIOUS:   05-18-18: wbc 10.7; hgb 11.9; hct 36.5; mcv 97.6; plt 226; glucose 92; bun 7; creat 0.55; k+ 3.9; na++ 137; ca 8.1   NO NEW LABS.    Review of Systems  Constitutional: Negative for malaise/fatigue.  Respiratory: Negative for cough and shortness of breath.   Cardiovascular: Negative for chest pain, palpitations and leg swelling.  Gastrointestinal: Negative for abdominal pain, constipation and heartburn.  Musculoskeletal: Negative for back pain, joint pain and myalgias.       Left hip pain is managed   Skin: Negative.   Neurological: Negative for dizziness.  Psychiatric/Behavioral: The patient is not nervous/anxious.     Physical Exam  Constitutional: She is oriented to person, place, and time. She appears well-developed and well-nourished. No distress.  Neck: No thyromegaly present.  Cardiovascular: Normal rate, regular rhythm, normal heart sounds and intact distal pulses.  Pulmonary/Chest: Effort normal and breath sounds normal. No respiratory distress.  Abdominal: Soft. Bowel sounds are normal. She exhibits no distension. There is no tenderness.  Musculoskeletal: She exhibits no edema.  Is able to move all extremities Status post left hip arthroplasty    Lymphadenopathy:    She has no cervical adenopathy.  Neurological: She is alert and oriented to person, place, and time.  Skin: Skin is warm and dry. She is not diaphoretic.  Left hip incision line without signs of infection present.   Psychiatric: She has a normal mood and affect.     ASSESSMENT/ PLAN:  Patient is being discharged with the following home health services:  Pt/ot: to evaluate and treat as indicated for gait balance strength adl training.   Patient is being discharged with the following durable medical equipment:  None needed   Patient has been  advised to f/u with their PCP in 1-2 weeks to bring them up to date on their rehab stay.  Social services at facility was responsible for arranging this appointment.  Pt was provided with a 30 day supply of prescriptions for medications and refills must be obtained from their PCP.  For controlled substances, a more limited supply may be provided adequate until PCP appointment only.  A 30 day supply of her prescription medications written as listed above  #10 restoril 15 mg tabs #10 valium 5 mg tabs.    Ok Edwards NP [facility]  Contact [phone] Monday through Friday 8am- 5pm  After hours call [phone]

## 2018-05-24 NOTE — Telephone Encounter (Signed)
Discharged with patient 

## 2018-06-01 DIAGNOSIS — L03116 Cellulitis of left lower limb: Secondary | ICD-10-CM | POA: Insufficient documentation

## 2018-07-12 ENCOUNTER — Other Ambulatory Visit: Payer: Self-pay | Admitting: Internal Medicine

## 2018-07-12 ENCOUNTER — Ambulatory Visit
Admission: RE | Admit: 2018-07-12 | Discharge: 2018-07-12 | Disposition: A | Discharge status: Home / Self Care | Payer: Medicare Other | Source: Ambulatory Visit | Attending: Internal Medicine | Admitting: Internal Medicine

## 2018-07-12 DIAGNOSIS — R0602 Shortness of breath: Secondary | ICD-10-CM

## 2018-07-12 LAB — POCT I-STAT CREATININE: Creatinine, Ser: 0.8 mg/dL (ref 0.44–1.00)

## 2018-07-12 MED ORDER — IOPAMIDOL (ISOVUE-300) INJECTION 61%
60.0000 mL | Freq: Once | INTRAVENOUS | Status: AC | PRN
  Administered 2018-07-12: 60 mL via INTRAVENOUS

## 2018-07-14 ENCOUNTER — Other Ambulatory Visit: Payer: Self-pay | Admitting: Internal Medicine

## 2018-07-14 DIAGNOSIS — R109 Unspecified abdominal pain: Secondary | ICD-10-CM

## 2018-07-24 ENCOUNTER — Ambulatory Visit
Admission: RE | Admit: 2018-07-24 | Discharge: 2018-07-24 | Disposition: A | Discharge status: Home / Self Care | Payer: Medicare Other | Source: Ambulatory Visit | Attending: Internal Medicine | Admitting: Internal Medicine

## 2018-07-24 DIAGNOSIS — R109 Unspecified abdominal pain: Secondary | ICD-10-CM | POA: Diagnosis present

## 2018-07-24 MED ORDER — IOPAMIDOL (ISOVUE-300) INJECTION 61%
75.0000 mL | Freq: Once | INTRAVENOUS | Status: AC | PRN
  Administered 2018-07-24: 75 mL via INTRAVENOUS

## 2018-08-03 DIAGNOSIS — J431 Panlobular emphysema: Secondary | ICD-10-CM | POA: Insufficient documentation

## 2018-08-03 DIAGNOSIS — I700 Atherosclerosis of aorta: Secondary | ICD-10-CM | POA: Insufficient documentation

## 2018-08-03 DIAGNOSIS — M818 Other osteoporosis without current pathological fracture: Secondary | ICD-10-CM | POA: Insufficient documentation

## 2018-08-23 NOTE — Progress Notes (Signed)
08/24/2018 9:38 AM   Yolanda Jimenez 1946/02/24 [id number]  Referring provider: Rusty Aus, MD [facility] [place], [place] [address]  Chief Complaint  Patient presents with  . New Patient (Initial Visit)    hematuria    HPI: Yolanda Jimenez is a 73 yo F with a history of stable microscopic hematuria returns today for the evaluation and management of microscopic hematuria.   Her last visit with Yolanda Jimenez was on 03/22/2018 with Yolanda Jimenez to establish local urologic care. She was previously with Yolanda Jimenez for more than 10 years for microscopic hematuria.Her last cystoscopy was in 2015.   She reports of losing 40 pounds in the last 2 years which she believes is due to reducing sugar intake. She does not report of gross hematuria. She reports of nocturia 1-2x. She denies any recent UTIs. She discontinued use of vaginal estrogen cream.   CT on 07/24/2018 shows slight enlargement of a now 3 cm left renal cyst and a stable 12 mm left adrenal nodule, suspect adenoma   Her UA today is positive for 3-10 RBC otherwise unremarkable.  She is a smoker.   PMH: Past Medical History:  Diagnosis Date  . AVM (arteriovenous malformation)   . Barrett esophagus   . C. difficile diarrhea   . Carotid stenosis   . Diabetes mellitus without complication ([place])   . Diverticulosis   . GERD (gastroesophageal reflux disease)   . Heart murmur   . Hx of adenomatous colonic polyps   . Hyperlipidemia   . IBS (irritable bowel syndrome)   . Insomnia   . Iron deficiency anemia   . Kidney cysts    hx UTIs  . Microscopic hematuria   . Osteoporosis    mild  . PVD (peripheral vascular disease) ([place])   . Wears dentures    partial lower    Surgical History: Past Surgical History:  Procedure Laterality Date  . BUNIONECTOMY     right foot  . CATARACT EXTRACTION     both eyes  . CERVICAL CONE BIOPSY    . CHOLECYSTECTOMY    . COLONOSCOPY    .  ESOPHAGOGASTRODUODENOSCOPY (EGD) WITH PROPOFOL N/A 08/29/2017   Procedure: ESOPHAGOGASTRODUODENOSCOPY (EGD) WITH PROPOFOL;  Surgeon: Yolanda Lame, MD;  Location: [facility];  Service: Endoscopy;  Laterality: N/A;  . FOOT SURGERY     right 2nd toe  . HIP ARTHROPLASTY Left 05/16/2018   Procedure: ARTHROPLASTY BIPOLAR HIP (HEMIARTHROPLASTY);  Surgeon: Yolanda Mull, MD;  Location: [facility];  Service: Orthopedics;  Laterality: Left;  . TUBAL LIGATION    . UPPER GASTROINTESTINAL ENDOSCOPY      Home Medications:  Allergies as of 08/24/2018      Reactions   Codeine    "felt drunk, crazy"   Hydrocodone-acetaminophen Other (See Comments)   Confusion/delirium   Nsaids Other (See Comments)   States it makes her fell drunk States it makes her fell drunk      Medication List       Accurate as of August 24, 2018 11:59 PM. Always use your most recent med list.        acetaminophen 500 MG tablet Commonly known as:  TYLENOL Take 1,000 mg by mouth every 6 (six) hours as needed.   diazepam 5 MG tablet Commonly known as:  VALIUM Take 1 tablet (5 mg total) by mouth daily as needed for anxiety.   diclofenac sodium 1 % Gel Commonly known as:  VOLTAREN   enoxaparin 40 MG/0.4ML injection Commonly known as:  LOVENOX Inject 0.4 mLs (40 mg total) into the skin daily for 12 days.   ferrous gluconate 240 (27 FE) MG tablet Commonly known as:  FERGON Take 240 mg by mouth daily.   loperamide 2 MG capsule Commonly known as:  IMODIUM Take 2 mg by mouth as needed for diarrhea or loose stools.   magnesium oxide 400 MG tablet Commonly known as:  MAG-OX Take 400 mg by mouth daily.   NON FORMULARY Diet Type:  NCS   omeprazole 20 MG capsule Commonly known as:  PRILOSEC Take 2 tablets by mouth every morning (30 minutes before breakfast) and 1 tablet by mouth at bedtime.   PRESERVISION AREDS 2 PO Take 1 tablet by mouth daily.   RISA-BID PROBIOTIC Tabs Take 1 tablet by mouth 2 (two)  times daily.   simvastatin 40 MG tablet Commonly known as:  ZOCOR Take 40 mg by mouth at bedtime.   temazepam 15 MG capsule Commonly known as:  RESTORIL Take 1 capsule (15 mg total) by mouth at bedtime as needed for sleep.   vitamin C 1000 MG tablet Take by mouth.   vitamin C 1000 MG tablet Take 1,000 mg by mouth daily.   Cholecalciferol 50 MCG (2000 UT) Caps Take by mouth.   Vitamin D 1000 units capsule Take 1,000 Units by mouth daily.       Allergies:  Allergies  Allergen Reactions  . Codeine     "felt drunk, crazy"  . Hydrocodone-Acetaminophen Other (See Comments)    Confusion/delirium  . Nsaids Other (See Comments)    States it makes her fell drunk States it makes her fell drunk    Family History: Family History  Problem Relation Age of Onset  . Diabetes Mother   . Heart disease Mother   . Hypertension Mother   . Diabetes Son   . Diabetes Sister   . Inflammatory bowel disease Father        diverticulitis  . Multiple myeloma Father   . Hypertension Son   . Diabetes Maternal Grandmother   . Colon cancer Neg Hx   . Esophageal cancer Neg Hx   . Rectal cancer Neg Hx   . Stomach cancer Neg Hx   . Breast cancer Neg Hx     Social History:  reports that she has been smoking cigarettes. She has a 27.50 pack-year smoking history. She has never used smokeless tobacco. She reports current alcohol use of about 2.0 standard drinks of alcohol per week. She reports that she does not use drugs.  ROS: UROLOGY Frequent Urination?: Yes Hard to postpone urination?: No Burning/pain with urination?: No Get up at night to urinate?: Yes Leakage of urine?: No Urine stream starts and stops?: No Trouble starting stream?: No Do you have to strain to urinate?: No Blood in urine?: Yes Urinary tract infection?: No Sexually transmitted disease?: No Injury to kidneys or bladder?: No Painful intercourse?: No Weak stream?: No Currently pregnant?: No Vaginal bleeding?:  No Last menstrual period?: n  Gastrointestinal Nausea?: No Vomiting?: No Indigestion/heartburn?: No Diarrhea?: No Constipation?: No  Constitutional Fever: No Night sweats?: No Weight loss?: No Fatigue?: No  Skin Skin rash/lesions?: No Itching?: No  Eyes Blurred vision?: No Double vision?: No  Ears/Nose/Throat Sore throat?: No Sinus problems?: No  Hematologic/Lymphatic Swollen glands?: No Easy bruising?: No  Cardiovascular Leg swelling?: No Chest pain?: No  Respiratory Cough?: No Shortness of breath?: No  Endocrine Excessive thirst?:  No  Musculoskeletal Back pain?: No Joint pain?: No  Neurological Headaches?: No Dizziness?: No  Psychologic Depression?: No Anxiety?: No  Physical Exam: BP 136/86 (BP Location: Left Arm, Patient Position: Sitting)   Pulse 73   Ht 5' 2"  (1.575 m)   Wt 98 lb (44.5 kg)   BMI 17.92 kg/m   Constitutional:  Alert and oriented, No acute distress.  Thin. HEENT:  AT, moist mucus membranes.  Trachea midline, no masses. Cardiovascular: No clubbing, cyanosis, or edema. Respiratory: Normal respiratory effort, no increased work of breathing. Skin: No rashes, bruises or suspicious lesions. Neurologic: Grossly intact, no focal deficits, moving all 4 extremities. Psychiatric: Normal mood and affect.  Laboratory Data: Creatinine 0.8 on 07/2018 Hemoglobin 14.5  Urinalysis Results for orders placed or performed in visit on 08/24/18  Microscopic Examination  Result Value Ref Range   WBC, UA None seen 0 - 5 /hpf   RBC, UA 3-10 (A) 0 - 2 /hpf   Epithelial Cells (non renal) 0-10 0 - 10 /hpf   Casts Present (A) None seen /lpf   Cast Type Hyaline casts N/A   Mucus, UA Present (A) Not Estab.   Bacteria, UA Moderate (A) None seen/Few  Urinalysis, Complete  Result Value Ref Range   Specific Gravity, UA >1.030 (H) 1.005 - 1.030   pH, UA 5.0 5.0 - 7.5   Color, UA Yellow Yellow   Appearance Ur Clear Clear   Leukocytes, UA Trace  (A) Negative   Protein, UA Trace (A) Negative/Trace   Glucose, UA Negative Negative   Ketones, UA Negative Negative   RBC, UA 2+ (A) Negative   Bilirubin, UA Negative Negative   Urobilinogen, Ur 0.2 0.2 - 1.0 mg/dL   Nitrite, UA Negative Negative   Microscopic Examination See below:     Pertinent Imaging: CLINICAL DATA:  Mid to left lower abdominal pain  EXAM: CT ABDOMEN AND PELVIS WITH CONTRAST  TECHNIQUE: Multidetector CT imaging of the abdomen and pelvis was performed using the standard protocol following bolus administration of intravenous contrast.  CONTRAST:  58m ISOVUE-300 IOPAMIDOL (ISOVUE-300) INJECTION 61%  COMPARISON:  04/20/2015  FINDINGS: Lower chest: No acute abnormality.  Hepatobiliary: Remote cholecystectomy.  Stable biliary prominence, suspect postsurgical. Focal fatty infiltration of the liver along the falciform ligament, image 15 series 2, unchanged. No other hepatic abnormality. No surrounding ascites.  Pancreas: Unremarkable. No pancreatic ductal dilatation or surrounding inflammatory changes.  Spleen: Normal in size without focal abnormality.  Adrenals/Urinary Tract: Stable 12 mm left adrenal nodule, suspect adenoma. No other significant renal abnormality. Slight enlargement of the hypodense exophytic anterior renal cyst on the left kidney lower pole measuring 3 cm. No other significant renal abnormality, obstruction, hydronephrosis. No surrounding perinephric edema. Ureters are nondilated. No hydroureter, ureteral obstruction, hydronephrosis. Bladder grossly unremarkable. Portions of the bladder obscured by artifact from a left hip replacement hardware.  Stomach/Bowel: Stomach wall appears thickened but this likely is secondary to underdistention. Negative for bowel obstruction, significant dilatation, ileus, or free air. Portions of the appendix appear normal extending over the right iliac vessels into  the pelvis.  Extensive rectosigmoid diverticulosis in the pelvis without definite acute inflammatory process, fluid collection, obstruction, abscess, or ascites.  Vascular/Lymphatic: Aortoiliac atherosclerosis. Negative for obstruction. Mesenteric and renal vasculature appear patent.  No adenopathy.  Reproductive: Small uterine calcifications noted as before. Uterus and adnexa partially obscured by the artifact from the left hip replacement. No large adnexal abnormality or mass. No pelvic free fluid or ascites.  Other: No  inguinal abnormality. In the subxiphoid region, there is a small fat containing ventral hernia anterior to the left hepatic lobe. This appears slightly smaller than the prior exam.  Musculoskeletal: Bones are osteopenic. Degenerative changes noted of the spine. Mild scoliosis. Left hip replacement hardware noted. No acute osseous finding.  IMPRESSION: No definite acute intra-abdominal or pelvic finding by CT.  Slightly smaller subxiphoid midline ventral hernia overlying the liver with protruding fat, as before.  Remote cholecystectomy and stable biliary prominence  Stable 12 mm left adrenal nodule, suspect adenoma  Nonspecific stomach wall prominence versus mild thickening may be related to underdistention  Abdominal atherosclerosis without aneurysm  Slight enlargement of a now 3 cm left renal cyst  Rectosigmoid diverticulosis without definite acute inflammatory process  Interval left hip replacement   Electronically Signed   By: Jerilynn Mages.  Shick M.D.   On: 07/24/2018 08:47  I have reviewed CT independently and agree with radiologic interpretation.  Assessment & Plan:    1. Microscopic Hematuria -We discussed the differential diagnosis for microscopic hematuria including nephrolithiasis, renal or upper tract tumors, bladder stones, UTIs, or bladder tumors as well as undetermined etiologies. -Schedule for cystoscopy; risk for  bladder cancer due to smoking   2. Renal Cyst -CT on 07/24/2018 shows slight enlargement of a now 3 cm left renal cyst and stable 12 mm left adrenal nodule; both benign   Return for cysto next available.  Jamas Lav, am acting as a scribe for Dr. Hollice Espy,  I have reviewed the above documentation for accuracy and completeness, and I agree with the above.   Hollice Espy, MD  [facility] [address], [place], [place] [address] [phone]

## 2018-08-24 ENCOUNTER — Encounter: Payer: Self-pay | Admitting: Urology

## 2018-08-24 ENCOUNTER — Ambulatory Visit: Payer: Medicare Other | Admitting: Urology

## 2018-08-24 VITALS — BP 136/86 | HR 73 | Ht 62.0 in | Wt 98.0 lb

## 2018-08-24 DIAGNOSIS — R3129 Other microscopic hematuria: Secondary | ICD-10-CM

## 2018-08-24 DIAGNOSIS — N281 Cyst of kidney, acquired: Secondary | ICD-10-CM

## 2018-08-24 LAB — URINALYSIS, COMPLETE
Bilirubin, UA: NEGATIVE
Glucose, UA: NEGATIVE
Ketones, UA: NEGATIVE
Nitrite, UA: NEGATIVE
Specific Gravity, UA: >1.03 — ABNORMAL HIGH (ref 1.005–1.030)
Urobilinogen, Ur: 0.2 mg/dL (ref 0.2–1.0)
pH, UA: 5 (ref 5.0–7.5)

## 2018-08-24 LAB — MICROSCOPIC EXAMINATION: WBC, UA: NONE SEEN /hpf (ref 0–5)

## 2018-09-13 NOTE — Progress Notes (Signed)
   09/14/18   CC:  Chief Complaint  Patient presents with  . Cysto    HPI: Yolanda Jimenez is a 73 yo F who presents today for a cystoscopy for the evaluation and management of a history of microscopic hematuria for more than 10 years.   CT on 07/24/2018 showed slight enlargement of a now 3 cm left renal cyst and a stable 12 mm left adrenal nodule, suspect adenoma. Her UA was positive for 3-10 RBC otherwise unremarkable.   UA today positive for 11-30 RBC, many bacteria, and 0-5 WBC.   She is a smoker.   Blood pressure 134/86, pulse 70, height 5\' 2"  (1.575 m), weight 96 lb (43.5 kg). NED. A&Ox3.   No respiratory distress   Abd soft, NT, ND Normal external genitalia with patent urethral meatus  Cystoscopy Procedure Note  Patient identification was confirmed, informed consent was obtained, and patient was prepped using Betadine solution.  Lidocaine jelly was administered per urethral meatus.    Procedure: - Flexible cystoscope introduced, without any difficulty.   - Thorough search of the bladder revealed:    normal urethral meatus    normal urothelium    no stones    no ulcers     no tumors    no urethral polyps    no trabeculation  - Ureteral orifices were normal in position and appearance.  Post-Procedure: - Patient tolerated the procedure well  Assessment/ Plan:  1. Microscopic hematuria Risk for bladder cancer due to smoking  Cystoscopy today was unremarkable; cystoscopy to be performed every other year F/u with Larene Beach in a year for UA/cytology given ongoing smoking and risk for bladder cancer May consider cysto q2-3 years and possible upper tract imaging with annual UA/ cytology   Return for with Larene Beach in a year for UA/cytology .  Jamas Lav, am acting as a scribe for Dr. Hollice Espy,  I have reviewed the above documentation for accuracy and completeness, and I agree with the above.   Hollice Espy, MD

## 2018-09-14 ENCOUNTER — Ambulatory Visit: Payer: Medicare Other | Admitting: Urology

## 2018-09-14 ENCOUNTER — Encounter: Payer: Self-pay | Admitting: Urology

## 2018-09-14 VITALS — BP 134/86 | HR 70 | Ht 62.0 in | Wt 96.0 lb

## 2018-09-14 DIAGNOSIS — R3129 Other microscopic hematuria: Secondary | ICD-10-CM | POA: Diagnosis not present

## 2018-09-14 LAB — MICROSCOPIC EXAMINATION

## 2018-09-14 LAB — URINALYSIS, COMPLETE
Bilirubin, UA: NEGATIVE
Glucose, UA: NEGATIVE
Nitrite, UA: NEGATIVE
Specific Gravity, UA: >1.03 — ABNORMAL HIGH (ref 1.005–1.030)
Urobilinogen, Ur: 0.2 mg/dL (ref 0.2–1.0)
pH, UA: 5.5 (ref 5.0–7.5)

## 2018-09-25 DIAGNOSIS — E440 Moderate protein-calorie malnutrition: Secondary | ICD-10-CM | POA: Insufficient documentation

## 2018-09-25 DIAGNOSIS — D369 Benign neoplasm, unspecified site: Secondary | ICD-10-CM | POA: Insufficient documentation

## 2018-09-28 ENCOUNTER — Ambulatory Visit: Payer: Medicare Other | Admitting: Gastroenterology

## 2018-10-18 ENCOUNTER — Encounter: Payer: Self-pay | Admitting: Gastroenterology

## 2018-10-18 ENCOUNTER — Other Ambulatory Visit: Payer: Self-pay

## 2018-10-18 ENCOUNTER — Ambulatory Visit: Payer: Medicare Other | Admitting: Gastroenterology

## 2018-10-18 VITALS — BP 127/73 | HR 60 | Ht 62.0 in | Wt 96.8 lb

## 2018-10-18 DIAGNOSIS — R197 Diarrhea, unspecified: Secondary | ICD-10-CM

## 2018-10-18 DIAGNOSIS — R634 Abnormal weight loss: Secondary | ICD-10-CM

## 2018-10-18 NOTE — Progress Notes (Signed)
Primary Care Physician: Rusty Aus, MD  Primary Gastroenterologist:  Dr. Lucilla Lame  Chief Complaint  Patient presents with  . Irritable Bowel Syndrome    HPI: Yolanda Jimenez is a 73 y.o. female here with a history of irritable bowel syndrome but she continues to have weight loss. The patient has had a CT scan of the chest and abdomen without any findings to explain her weight loss.  The patient states that she has diarrhea for which she takes one half of an Imodium tablet and then she becomes constipated.  There is no report of any black stools or bloody stools.  The patient does have a history of adenomatous polyps at her previous colonoscopies.  She denies any nausea vomiting black stools or bloody stools.  She does report that she has diffuse abdominal discomfort when she has her irritable bowel syndrome flare up.  The patient's last colonoscopy was done in [place].  She also has a history of Barrett's esophagus that is not due for repeat Endoscopy.  Current Outpatient Medications  Medication Sig Dispense Refill  . acetaminophen (TYLENOL) 500 MG tablet Take 1,000 mg by mouth every 6 (six) hours as needed.    . Ascorbic Acid (VITAMIN C) 1000 MG tablet Take 1,000 mg by mouth daily.      . Ascorbic Acid (VITAMIN C) 1000 MG tablet Take by mouth.    . Cholecalciferol (VITAMIN D) 1000 UNITS capsule Take 1,000 Units by mouth daily.      . Cholecalciferol 50 MCG (2000 UT) CAPS Take by mouth.    . diazepam (VALIUM) 5 MG tablet Take 1 tablet (5 mg total) by mouth daily as needed for anxiety. 10 tablet 0  . ferrous gluconate (FERGON) 240 (27 FE) MG tablet Take 240 mg by mouth daily.     [name] loperamide (IMODIUM) 2 MG capsule Take 2 mg by mouth as needed for diarrhea or loose stools.    . magnesium oxide (MAG-OX) 400 MG tablet Take 400 mg by mouth daily.    . Multiple Vitamins-Minerals (PRESERVISION AREDS 2 PO) Take 1 tablet by mouth daily.    . NON FORMULARY Diet Type:  NCS    .  omeprazole (PRILOSEC) 20 MG capsule Take 2 tablets by mouth every morning (30 minutes before breakfast) and 1 tablet by mouth at bedtime. 270 capsule 3  . simvastatin (ZOCOR) 40 MG tablet Take 40 mg by mouth at bedtime.      . temazepam (RESTORIL) 15 MG capsule Take 1 capsule (15 mg total) by mouth at bedtime as needed for sleep. 10 capsule 0  . diclofenac sodium (VOLTAREN) 1 % GEL     . enoxaparin (LOVENOX) 40 MG/0.4ML injection Inject 0.4 mLs (40 mg total) into the skin daily for 12 days.    . Probiotic Product (RISA-BID PROBIOTIC) TABS Take 1 tablet by mouth 2 (two) times daily.     No current facility-administered medications for this visit.     Allergies as of 10/18/2018 - Review Complete 10/18/2018  Allergen Reaction Noted  . Codeine  08/23/2008  . Hydrocodone-acetaminophen Other (See Comments) 04/25/2015  . Lidocaine  09/14/2018  . Nsaids Other (See Comments) 05/15/2018    ROS:  General: Negative for anorexia, weight loss, fever, chills, fatigue, weakness. ENT: Negative for hoarseness, difficulty swallowing , nasal congestion. CV: Negative for chest pain, angina, palpitations, dyspnea on exertion, peripheral edema.  Respiratory: Negative for dyspnea at rest, dyspnea on exertion, cough, sputum, wheezing.  GI: See  history of present illness. GU:  Negative for dysuria, hematuria, urinary incontinence, urinary frequency, nocturnal urination.  Endo: Negative for unusual weight change.    Physical Examination:   BP 127/73   Pulse 60   Ht 5\' 2"  (1.575 m)   Wt 96 lb 12.8 oz (43.9 kg)   BMI 17.70 kg/m   General: Well-nourished, well-developed in no acute distress.  Eyes: No icterus. Conjunctivae pink. Mouth: Oropharyngeal mucosa moist and pink , no lesions erythema or exudate. Lungs: Clear to auscultation bilaterally. Non-labored. Heart: Regular rate and rhythm, no murmurs rubs or gallops.  Abdomen: Bowel sounds are normal, nontender, nondistended, no hepatosplenomegaly or  masses, no abdominal bruits or hernia , no rebound or guarding.   Extremities: No lower extremity edema. No clubbing or deformities. Neuro: Alert and oriented x 3.  Grossly intact. Skin: Warm and dry, no jaundice.   Psych: Alert and cooperative, normal mood and affect.  Labs:    Imaging Studies: No results found.  Assessment and Plan:   Yolanda Jimenez is a 73 y.o. y/o female who comes in today with a history of Irritable bowel syndrome and diarrhea.  The patient states that her symptoms have been worse recently.  There is no report of any black stools or bloody stools but she continues to lose weight despite a negative CT scan of the abdomen and pelvis and a CT scan of the chest.  The patient may have microscopic colitis and has a history of adenomatous polyps I have requested that the patient have a repeat colonoscopy.  The patient has agreed to undergo a repeat colonoscopy. I have discussed risks & benefits which include, but are not limited to, bleeding, infection, perforation & drug reaction.  The patient agrees with this plan & written consent will be obtained.       Lucilla Lame, MD. [facility]   Note: This dictation was prepared with Dragon dictation along with smaller phrase technology. Any transcriptional errors that result from this process are unintentional.

## 2018-10-19 ENCOUNTER — Other Ambulatory Visit: Payer: Self-pay

## 2018-10-19 DIAGNOSIS — R634 Abnormal weight loss: Secondary | ICD-10-CM

## 2018-10-19 DIAGNOSIS — R197 Diarrhea, unspecified: Secondary | ICD-10-CM

## 2018-10-24 ENCOUNTER — Encounter (INDEPENDENT_AMBULATORY_CARE_PROVIDER_SITE_OTHER): Payer: Self-pay | Admitting: Vascular Surgery

## 2018-10-24 ENCOUNTER — Ambulatory Visit (INDEPENDENT_AMBULATORY_CARE_PROVIDER_SITE_OTHER): Payer: Medicare Other | Admitting: Vascular Surgery

## 2018-10-24 ENCOUNTER — Other Ambulatory Visit: Payer: Self-pay

## 2018-10-24 ENCOUNTER — Ambulatory Visit (INDEPENDENT_AMBULATORY_CARE_PROVIDER_SITE_OTHER): Payer: Medicare Other

## 2018-10-24 VITALS — BP 136/77 | HR 60 | Resp 16 | Ht 62.0 in | Wt 96.8 lb

## 2018-10-24 DIAGNOSIS — I739 Peripheral vascular disease, unspecified: Secondary | ICD-10-CM

## 2018-10-24 DIAGNOSIS — Z7984 Long term (current) use of oral hypoglycemic drugs: Secondary | ICD-10-CM

## 2018-10-24 DIAGNOSIS — E119 Type 2 diabetes mellitus without complications: Secondary | ICD-10-CM | POA: Diagnosis not present

## 2018-10-24 DIAGNOSIS — F17210 Nicotine dependence, cigarettes, uncomplicated: Secondary | ICD-10-CM

## 2018-10-24 DIAGNOSIS — I6523 Occlusion and stenosis of bilateral carotid arteries: Secondary | ICD-10-CM

## 2018-10-24 DIAGNOSIS — E785 Hyperlipidemia, unspecified: Secondary | ICD-10-CM

## 2018-10-24 DIAGNOSIS — Z79899 Other long term (current) drug therapy: Secondary | ICD-10-CM

## 2018-10-24 NOTE — Progress Notes (Signed)
MRN : [id number]  Yolanda Jimenez is a 73 y.o. (10-03-1945) female who presents with chief complaint of  Chief Complaint  Patient presents with  . Follow-up    ultrasound follow up  .  History of Present Illness: Patient returns in follow-up.  She is doing well without complaints other than some pain and discoloration of the toes on her right foot.  This has been present for many years and is chronic.  No major changes.  She says once the weather warms this will get much better.  Her ABIs remain normal today although her digital pressures are markedly reduced and difficult to find bilaterally. She is also studied with carotid duplex.  She has no focal neurologic symptoms.  Her carotid duplex shows irregular and calcific plaque but velocities consistent with 1 to 39% stenosis bilaterally.  Current Outpatient Medications  Medication Sig Dispense Refill  . acetaminophen (TYLENOL) 500 MG tablet Take 1,000 mg by mouth every 6 (six) hours as needed.    . Ascorbic Acid (VITAMIN C) 1000 MG tablet Take 1,000 mg by mouth daily.      . Ascorbic Acid (VITAMIN C) 1000 MG tablet Take by mouth.    . Cholecalciferol (VITAMIN D) 1000 UNITS capsule Take 1,000 Units by mouth daily.      . Cholecalciferol 50 MCG (2000 UT) CAPS Take by mouth.    . diazepam (VALIUM) 5 MG tablet Take 1 tablet (5 mg total) by mouth daily as needed for anxiety. 10 tablet 0  . diclofenac sodium (VOLTAREN) 1 % GEL     . ferrous gluconate (FERGON) 240 (27 FE) MG tablet Take 240 mg by mouth daily.     [name] loperamide (IMODIUM) 2 MG capsule Take 2 mg by mouth as needed for diarrhea or loose stools.    . magnesium oxide (MAG-OX) 400 MG tablet Take 400 mg by mouth daily.    . Multiple Vitamins-Minerals (PRESERVISION AREDS 2 PO) Take 1 tablet by mouth daily.    . NON FORMULARY Diet Type:  NCS    . omeprazole (PRILOSEC) 20 MG capsule Take 2 tablets by mouth every morning (30 minutes before breakfast) and 1 tablet by mouth at bedtime. 270  capsule 3  . Probiotic Product (RISA-BID PROBIOTIC) TABS Take 1 tablet by mouth 2 (two) times daily.    . simvastatin (ZOCOR) 40 MG tablet Take 40 mg by mouth at bedtime.      . temazepam (RESTORIL) 15 MG capsule Take 1 capsule (15 mg total) by mouth at bedtime as needed for sleep. 10 capsule 0  . enoxaparin (LOVENOX) 40 MG/0.4ML injection Inject 0.4 mLs (40 mg total) into the skin daily for 12 days.     No current facility-administered medications for this visit.     Past Medical History:  Diagnosis Date  . AVM (arteriovenous malformation)   . Barrett esophagus   . C. difficile diarrhea   . Carotid stenosis   . Diabetes mellitus without complication ([place])   . Diverticulosis   . GERD (gastroesophageal reflux disease)   . Heart murmur   . Hx of adenomatous colonic polyps   . Hyperlipidemia   . IBS (irritable bowel syndrome)   . Insomnia   . Iron deficiency anemia   . Kidney cysts    hx UTIs  . Microscopic hematuria   . Osteoporosis    mild  . PVD (peripheral vascular disease) ([place])   . Wears dentures    partial lower  Past Surgical History:  Procedure Laterality Date  . BUNIONECTOMY     right foot  . CATARACT EXTRACTION     both eyes  . CERVICAL CONE BIOPSY    . CHOLECYSTECTOMY    . COLONOSCOPY    . ESOPHAGOGASTRODUODENOSCOPY (EGD) WITH PROPOFOL N/A 08/29/2017   Procedure: ESOPHAGOGASTRODUODENOSCOPY (EGD) WITH PROPOFOL;  Surgeon: Lucilla Lame, MD;  Location: [facility];  Service: Endoscopy;  Laterality: N/A;  . FOOT SURGERY     right 2nd toe  . HIP ARTHROPLASTY Left 05/16/2018   Procedure: ARTHROPLASTY BIPOLAR HIP (HEMIARTHROPLASTY);  Surgeon: Corky Mull, MD;  Location: [facility];  Service: Orthopedics;  Laterality: Left;  . TUBAL LIGATION    . UPPER GASTROINTESTINAL ENDOSCOPY      Social History  Substance Use Topics  . Smoking status: Current Every Day Smoker    Packs/day: 0.50    Types: Cigarettes  . Smokeless tobacco: Never Used      Comment: she has patches to start just not started yet.   . Alcohol use Yes      Comment: socially    Family History       Family History  Problem Relation Age of Onset  . Colon cancer Neg Hx   . Esophageal cancer Neg Hx   . Rectal cancer Neg Hx   . Stomach cancer Neg Hx   . Diabetes Mother   . Heart disease Mother   . Hypertension Mother   . Diabetes Son   . Diabetes Sister   . Inflammatory bowel disease Father     diverticulitis  . Multiple myeloma Father   . Hypertension Son   . Diabetes Maternal Grandmother          Allergies  Allergen Reactions  . Codeine     "felt drunk, crazy"  . Hydrocodone-Acetaminophen Other (See Comments)    Confusion/delirium     REVIEW OF SYSTEMS(Negative unless checked)  Constitutional: [] ?Weight loss[] ?Fever[] ?Chills Cardiac:[] ?Chest pain[] ?Chest pressure[] ?Palpitations [] ?Shortness of breath when laying flat [] ?Shortness of breath at rest [] ?Shortness of breath with exertion. Vascular: [] ?Pain in legs with walking[] ?Pain in legsat rest[] ?Pain in legs when laying flat [] ?Claudication [] ?Pain in feet when walking [x] ?Pain in feet at rest [] ?Pain in feet when laying flat [] ?History of DVT [] ?Phlebitis [] ?Swelling in legs [] ?Varicose veins [] ?Non-healing ulcers Pulmonary: [] ?Uses home oxygen [] ?Productive cough[] ?Hemoptysis [] ?Wheeze [] ?COPD [] ?Asthma Neurologic: [] ?Dizziness [] ?Blackouts [] ?Seizures [] ?History of stroke [] ?History of TIA[] ?Aphasia [] ?Temporary blindness[] ?Dysphagia [] ?Weaknessor numbness in arms [] ?Weakness or numbnessin legs Musculoskeletal: [] ?Arthritis [] ?Joint swelling [] ?Joint pain [] ?Low back pain Hematologic:[] ?Easy bruising[] ?Easy bleeding [] ?Hypercoagulable state [] ?Anemic  Gastrointestinal:[] ?Blood in stool[] ?Vomiting blood[] ?Gastroesophageal reflux/heartburn[] ?Abdominal pain  Genitourinary: [] ?Chronic kidney disease [] ?Difficulturination [] ?Frequenturination [] ?Burning with urination[] ?Hematuria Skin: [] ?Rashes [x] ?Ulcers [x] ?Wounds Psychological: [] ?History of anxiety[] ?History of major depression.    Physical Examination  Vitals:   10/24/18 1045  BP: 136/77  Pulse: 60  Resp: 16  Weight: 96 lb 12.8 oz (43.9 kg)  Height: 5' 2"  (1.575 m)   Body mass index is 17.7 kg/m. Gen:  WD/WN, NAD Head: [place]/AT, No temporalis wasting. Ear/Nose/Throat: Hearing grossly intact, nares w/o erythema or drainage, trachea midline Eyes: Conjunctiva clear. Sclera non-icteric Neck: Supple.  No JVD Pulmonary:  Good air movement, respirations not labored, no [place] of accessory muscles Cardiac: RRR, No JVD Vascular:  Vessel Right Left  Radial Palpable Palpable                          PT Palpable Palpable  DP Palpable Palpable  Musculoskeletal: M/S 5/5 throughout.  No deformity or atrophy.  Some discoloration is present in the toes more so on the right foot than the left.  The third and fourth toes are the most cyanotic but capillary refill is still present. Neurologic: CN 2-12 intact. Sensation grossly intact in extremities.  Symmetrical.  Speech is fluent. Motor exam as listed above. Psychiatric: Judgment intact, Mood & affect appropriate for pt's clinical situation. Dermatologic: No rashes or ulcers noted.  No cellulitis or open wounds. Lymph : No Cervical, Axillary, or Inguinal lymphadenopathy.     CBC Lab Results  Component Value Date   WBC 10.7 (H) 05/18/2018   HGB 11.9 (L) 05/18/2018   HCT 36.5 05/18/2018   MCV 97.6 05/18/2018   PLT 226 05/18/2018    BMET    Component Value Date/Time   NA 137 05/18/2018 0515   NA 142 07/12/2014 2035   K 3.9 05/18/2018 0515   K 3.4 (L) 07/12/2014 2035   CL 102 05/18/2018 0515   CL 107 07/12/2014 2035   CO2 28 05/18/2018 0515   CO2 30 07/12/2014 2035   GLUCOSE 92 05/18/2018 0515   GLUCOSE  133 (H) 07/12/2014 2035   BUN 7 (L) 05/18/2018 0515   BUN 11 07/12/2014 2035   CREATININE 0.80 07/12/2018 1158   CREATININE 0.73 07/12/2014 2035   CALCIUM 8.1 (L) 05/18/2018 0515   CALCIUM 8.3 (L) 07/12/2014 2035   GFRNONAA >60 05/18/2018 0515   GFRNONAA >60 07/12/2014 2035   GFRAA >60 05/18/2018 0515   GFRAA >60 07/12/2014 2035   CrCl cannot be calculated (Patient's most recent lab result is older than the maximum 21 days allowed.).  COAG No results found for: INR, PROTIME  Radiology No results found.    Assessment/Plan Diabetes mellitus, type 2 blood glucose control important in reducing the progression of atherosclerotic disease. Also, involved in wound healing. On appropriate medications.   Hyperlipidemia lipid control important in reducing the progression of atherosclerotic disease. Continue statin therapy   Carotid stenosis Mild, 1-39% stenosis bilaterally.  No progression. Recheck in 1 year  PAD (peripheral artery disease) (HCC) Her ABIs today remain normal bilaterally with brisk triphasic waveforms, but her digital pressures are markedly reduced with very blunted waveforms throughout all of the toes.  This is consistent with some degree of small vessel disease and likely some vasospasm/Raynaud's disease.  No intervention recommended currently.  Would be very cautious with any procedures on her toes.  Recheck in 1 year.   Leotis Pain, MD  10/24/2018 11:23 AM    This note was created with Dragon medical transcription system.  Any errors from dictation are purely unintentional

## 2018-10-30 ENCOUNTER — Telehealth: Payer: Self-pay

## 2018-10-30 NOTE — Telephone Encounter (Signed)
Left message informing patient her 11/09/18 procedure has been canceled with Dr. Allen Norris due to [place].  We will call her back to schedule at a later time once restrictions have been lifted.  Thanks [employer]

## 2018-11-09 ENCOUNTER — Ambulatory Visit: Admit: 2018-11-09 | Payer: Medicare Other | Admitting: Gastroenterology

## 2018-11-09 SURGERY — COLONOSCOPY WITH PROPOFOL
Anesthesia: Choice

## 2018-11-14 ENCOUNTER — Other Ambulatory Visit: Payer: Self-pay

## 2018-11-14 DIAGNOSIS — R197 Diarrhea, unspecified: Secondary | ICD-10-CM

## 2018-11-14 DIAGNOSIS — R634 Abnormal weight loss: Secondary | ICD-10-CM

## 2019-01-16 ENCOUNTER — Other Ambulatory Visit: Payer: Self-pay

## 2019-01-16 ENCOUNTER — Encounter: Payer: Self-pay | Admitting: *Deleted

## 2019-01-16 ENCOUNTER — Telehealth: Payer: Self-pay | Admitting: Gastroenterology

## 2019-01-16 NOTE — Telephone Encounter (Signed)
Patient called & has a question about the covid-19 testing. She is scheduled for a colonoscopy on 01-22-2019 & seen in my chart she was to have some type of drive up testing?

## 2019-01-16 NOTE — Telephone Encounter (Signed)
Patient has been informed that her COVID 74 test on Thursday June 11th [facility] between 10:30am-12:30pm.  Thanks Sharyn Lull

## 2019-01-18 ENCOUNTER — Other Ambulatory Visit
Admission: RE | Admit: 2019-01-18 | Discharge: 2019-01-18 | Disposition: A | Discharge status: Home / Self Care | Payer: Medicare Other | Source: Ambulatory Visit | Attending: Gastroenterology | Admitting: Gastroenterology

## 2019-01-18 ENCOUNTER — Other Ambulatory Visit: Payer: Self-pay

## 2019-01-18 DIAGNOSIS — Z1159 Encounter for screening for other viral diseases: Secondary | ICD-10-CM | POA: Diagnosis present

## 2019-01-19 LAB — NOVEL CORONAVIRUS, NAA (HOSP ORDER, SEND-OUT TO REF LAB; TAT 18-24 HRS): SARS-CoV-2, NAA: NOT DETECTED

## 2019-01-19 NOTE — Discharge Instructions (Signed)
General Anesthesia, Adult, Care After  This sheet gives you information about how to care for yourself after your procedure. Your health care provider may also give you more specific instructions. If you have problems or questions, contact your health care provider.  What can I expect after the procedure?  After the procedure, the following side effects are common:  Pain or discomfort at the IV site.  Nausea.  Vomiting.  Sore throat.  Trouble concentrating.  Feeling cold or chills.  Weak or tired.  Sleepiness and fatigue.  Soreness and body aches. These side effects can affect parts of the body that were not involved in surgery.  Follow these instructions at home:    For at least 24 hours after the procedure:  Have a responsible adult stay with you. It is important to have someone help care for you until you are awake and alert.  Rest as needed.  Do not:  Participate in activities in which you could fall or become injured.  Drive.  Use heavy machinery.  Drink alcohol.  Take sleeping pills or medicines that cause drowsiness.  Make important decisions or sign legal documents.  Take care of children on your own.  Eating and drinking  Follow any instructions from your health care provider about eating or drinking restrictions.  When you feel hungry, start by eating small amounts of foods that are soft and easy to digest (bland), such as toast. Gradually return to your regular diet.  Drink enough fluid to keep your urine pale yellow.  If you vomit, rehydrate by drinking water, juice, or clear broth.  General instructions  If you have sleep apnea, surgery and certain medicines can increase your risk for breathing problems. Follow instructions from your health care provider about wearing your sleep device:  Anytime you are sleeping, including during daytime naps.  While taking prescription pain medicines, sleeping medicines, or medicines that make you drowsy.  Return to your normal activities as told by your health care  provider. Ask your health care provider what activities are safe for you.  Take over-the-counter and prescription medicines only as told by your health care provider.  If you smoke, do not smoke without supervision.  Keep all follow-up visits as told by your health care provider. This is important.  Contact a health care provider if:  You have nausea or vomiting that does not get better with medicine.  You cannot eat or drink without vomiting.  You have pain that does not get better with medicine.  You are unable to pass urine.  You develop a skin rash.  You have a fever.  You have redness around your IV site that gets worse.  Get help right away if:  You have difficulty breathing.  You have chest pain.  You have blood in your urine or stool, or you vomit blood.  Summary  After the procedure, it is common to have a sore throat or nausea. It is also common to feel tired.  Have a responsible adult stay with you for the first 24 hours after general anesthesia. It is important to have someone help care for you until you are awake and alert.  When you feel hungry, start by eating small amounts of foods that are soft and easy to digest (bland), such as toast. Gradually return to your regular diet.  Drink enough fluid to keep your urine pale yellow.  Return to your normal activities as told by your health care provider. Ask your health care   provider what activities are safe for you.  This information is not intended to replace advice given to you by your health care provider. Make sure you discuss any questions you have with your health care provider.  Document Released: 11/01/2000 Document Revised: 03/11/2017 Document Reviewed: 03/11/2017  Elsevier Interactive Patient Education  2019 [employer] Inc[employer]

## 2019-01-22 ENCOUNTER — Ambulatory Visit: Payer: Medicare Other | Admitting: Anesthesiology

## 2019-01-22 ENCOUNTER — Encounter
Admission: RE | Disposition: A | Discharge status: Home / Self Care | Payer: Self-pay | Source: Home / Self Care | Attending: Gastroenterology

## 2019-01-22 ENCOUNTER — Ambulatory Visit
Admission: RE | Admit: 2019-01-22 | Discharge: 2019-01-22 | Disposition: A | Discharge status: Home / Self Care | Payer: Medicare Other | Attending: Gastroenterology | Admitting: Gastroenterology

## 2019-01-22 ENCOUNTER — Other Ambulatory Visit: Payer: Self-pay

## 2019-01-22 DIAGNOSIS — F17210 Nicotine dependence, cigarettes, uncomplicated: Secondary | ICD-10-CM | POA: Insufficient documentation

## 2019-01-22 DIAGNOSIS — R634 Abnormal weight loss: Secondary | ICD-10-CM

## 2019-01-22 DIAGNOSIS — G4700 Insomnia, unspecified: Secondary | ICD-10-CM | POA: Diagnosis not present

## 2019-01-22 DIAGNOSIS — K635 Polyp of colon: Secondary | ICD-10-CM

## 2019-01-22 DIAGNOSIS — J449 Chronic obstructive pulmonary disease, unspecified: Secondary | ICD-10-CM | POA: Insufficient documentation

## 2019-01-22 DIAGNOSIS — K529 Noninfective gastroenteritis and colitis, unspecified: Secondary | ICD-10-CM | POA: Insufficient documentation

## 2019-01-22 DIAGNOSIS — M810 Age-related osteoporosis without current pathological fracture: Secondary | ICD-10-CM | POA: Diagnosis not present

## 2019-01-22 DIAGNOSIS — D122 Benign neoplasm of ascending colon: Secondary | ICD-10-CM | POA: Diagnosis not present

## 2019-01-22 DIAGNOSIS — E785 Hyperlipidemia, unspecified: Secondary | ICD-10-CM | POA: Diagnosis not present

## 2019-01-22 DIAGNOSIS — E1151 Type 2 diabetes mellitus with diabetic peripheral angiopathy without gangrene: Secondary | ICD-10-CM | POA: Diagnosis not present

## 2019-01-22 DIAGNOSIS — R197 Diarrhea, unspecified: Secondary | ICD-10-CM | POA: Diagnosis not present

## 2019-01-22 DIAGNOSIS — D509 Iron deficiency anemia, unspecified: Secondary | ICD-10-CM | POA: Insufficient documentation

## 2019-01-22 DIAGNOSIS — Z79899 Other long term (current) drug therapy: Secondary | ICD-10-CM | POA: Diagnosis not present

## 2019-01-22 DIAGNOSIS — K219 Gastro-esophageal reflux disease without esophagitis: Secondary | ICD-10-CM | POA: Insufficient documentation

## 2019-01-22 DIAGNOSIS — Z96642 Presence of left artificial hip joint: Secondary | ICD-10-CM | POA: Diagnosis not present

## 2019-01-22 DIAGNOSIS — K641 Second degree hemorrhoids: Secondary | ICD-10-CM | POA: Diagnosis not present

## 2019-01-22 DIAGNOSIS — I6529 Occlusion and stenosis of unspecified carotid artery: Secondary | ICD-10-CM | POA: Insufficient documentation

## 2019-01-22 DIAGNOSIS — K5730 Diverticulosis of large intestine without perforation or abscess without bleeding: Secondary | ICD-10-CM | POA: Insufficient documentation

## 2019-01-22 HISTORY — PX: COLONOSCOPY WITH PROPOFOL: SHX5780

## 2019-01-22 HISTORY — PX: POLYPECTOMY: SHX5525

## 2019-01-22 SURGERY — COLONOSCOPY WITH PROPOFOL
Anesthesia: General | Site: Rectum

## 2019-01-22 MED ORDER — STERILE WATER FOR IRRIGATION IR SOLN
Status: DC | PRN
  Administered 2019-01-22: 09:00:00 15 mL

## 2019-01-22 MED ORDER — LACTATED RINGERS IV SOLN
INTRAVENOUS | Status: DC
  Administered 2019-01-22: 08:00:00 via INTRAVENOUS

## 2019-01-22 MED ORDER — ONDANSETRON HCL 4 MG/2ML IJ SOLN: 4.0000 mg | Freq: Once | INTRAMUSCULAR | Status: DC | PRN

## 2019-01-22 MED ORDER — SODIUM CHLORIDE 0.9 % IV SOLN: INTRAVENOUS | Status: DC

## 2019-01-22 MED ORDER — PROPOFOL 10 MG/ML IV BOLUS
INTRAVENOUS | Status: DC | PRN
  Administered 2019-01-22 (×3): 20 mg via INTRAVENOUS
  Administered 2019-01-22: 10 mg via INTRAVENOUS
  Administered 2019-01-22 (×2): 20 mg via INTRAVENOUS
  Administered 2019-01-22: 10 mg via INTRAVENOUS
  Administered 2019-01-22: 20 mg via INTRAVENOUS
  Administered 2019-01-22: 30 mg via INTRAVENOUS
  Administered 2019-01-22: 20 mg via INTRAVENOUS
  Administered 2019-01-22: 30 mg via INTRAVENOUS
  Administered 2019-01-22: 70 mg via INTRAVENOUS
  Administered 2019-01-22 (×2): 20 mg via INTRAVENOUS

## 2019-01-22 MED ORDER — LACTATED RINGERS IV SOLN: INTRAVENOUS | Status: DC

## 2019-01-22 SURGICAL SUPPLY — 14 items
CANISTER SUCT 1200ML W/VALVE (MISCELLANEOUS) ×2 IMPLANT
CLIP HMST 235XBRD CATH ROT (MISCELLANEOUS) ×2 IMPLANT
CLIP RESOLUTION 360 11X235 (MISCELLANEOUS) ×2
ELEVIEW  INJECTABLE COMP 10 (MISCELLANEOUS) ×2
FORCEPS BIOP RAD 4 LRG CAP 4 (CUTTING FORCEPS) ×2 IMPLANT
GOWN CVR UNV OPN BCK APRN NK (MISCELLANEOUS) ×2 IMPLANT
GOWN ISOL THUMB LOOP REG UNIV (MISCELLANEOUS) ×2
INJECTABLE ELEVIEW COMP 10 (MISCELLANEOUS) ×2 IMPLANT
INJECTOR VARIJECT VIN23 (MISCELLANEOUS) ×1 IMPLANT
KIT ENDO PROCEDURE OLY (KITS) ×2 IMPLANT
SNARE SHORT THROW 13M SML OVAL (MISCELLANEOUS) ×2 IMPLANT
TRAP ETRAP POLY (MISCELLANEOUS) ×2 IMPLANT
VARIJECT INJECTOR VIN23 (MISCELLANEOUS) ×2
WATER STERILE IRR 250ML POUR (IV SOLUTION) ×2 IMPLANT

## 2019-01-22 NOTE — Anesthesia Preprocedure Evaluation (Signed)
Anesthesia Evaluation  Patient identified by MRN, date of birth, ID band Patient awake    Reviewed: Allergy & Precautions, NPO status , Patient's Chart, lab work & pertinent test results  Airway Mallampati: II  TM Distance: >3 FB     Dental   Pulmonary COPD, Current Smoker,    breath sounds clear to auscultation       Cardiovascular + Peripheral Vascular Disease   Rhythm:Regular Rate:Normal  HLD   Neuro/Psych    GI/Hepatic GERD  ,IBS   Endo/Other  diabetes  Renal/GU      Musculoskeletal   Abdominal   Peds  Hematology   Anesthesia Other Findings   Reproductive/Obstetrics                             Anesthesia Physical Anesthesia Plan  ASA: III  Anesthesia Plan: General   Post-op Pain Management:    Induction: Intravenous  PONV Risk Score and Plan:   Airway Management Planned: Nasal Cannula and Natural Airway  Additional Equipment:   Intra-op Plan:   Post-operative Plan:   Informed Consent: I have reviewed the patients History and Physical, chart, labs and discussed the procedure including the risks, benefits and alternatives for the proposed anesthesia with the patient or authorized representative who has indicated his/her understanding and acceptance.       Plan Discussed with: CRNA  Anesthesia Plan Comments:         Anesthesia Quick Evaluation

## 2019-01-22 NOTE — Anesthesia Postprocedure Evaluation (Signed)
Anesthesia Post Note  Patient: Yolanda Jimenez  Procedure(s) Performed: COLONOSCOPY WITH BIOPSY (N/A Rectum) POLYPECTOMY (N/A Rectum)  Patient location during evaluation: PACU Anesthesia Type: General Level of consciousness: awake Pain management: pain level controlled Vital Signs Assessment: post-procedure vital signs reviewed and stable Respiratory status: respiratory function stable Cardiovascular status: stable Postop Assessment: no signs of nausea or vomiting Anesthetic complications: no    Veda Canning

## 2019-01-22 NOTE — H&P (Signed)
Lucilla Lame, MD [address] [place], [place] [address] [phone] Fax : [phone]  Primary Care Physician:  Rusty Aus, MD Primary Gastroenterologist:  Dr. Allen Norris  Pre-Procedure History & Physical: HPI:  Yolanda Jimenez is a 73 y.o. female is here for an colonoscopy.   Past Medical History:  Diagnosis Date  . AVM (arteriovenous malformation)   . Barrett esophagus   . C. difficile diarrhea   . Carotid stenosis   . Diabetes mellitus without complication ([place])   . Diverticulosis   . GERD (gastroesophageal reflux disease)   . Heart murmur   . Hx of adenomatous colonic polyps   . Hyperlipidemia   . IBS (irritable bowel syndrome)   . Insomnia   . Iron deficiency anemia   . Kidney cysts    hx UTIs  . Microscopic hematuria   . Osteoporosis    mild  . PVD (peripheral vascular disease) ([place])   . Wears dentures    partial lower    Past Surgical History:  Procedure Laterality Date  . BUNIONECTOMY     right foot  . CATARACT EXTRACTION     both eyes  . CERVICAL CONE BIOPSY    . CHOLECYSTECTOMY    . COLONOSCOPY    . ESOPHAGOGASTRODUODENOSCOPY (EGD) WITH PROPOFOL N/A 08/29/2017   Procedure: ESOPHAGOGASTRODUODENOSCOPY (EGD) WITH PROPOFOL;  Surgeon: Lucilla Lame, MD;  Location: [facility];  Service: Endoscopy;  Laterality: N/A;  . FOOT SURGERY     right 2nd toe  . HIP ARTHROPLASTY Left 05/16/2018   Procedure: ARTHROPLASTY BIPOLAR HIP (HEMIARTHROPLASTY);  Surgeon: Corky Mull, MD;  Location: [facility];  Service: Orthopedics;  Laterality: Left;  . TUBAL LIGATION    . UPPER GASTROINTESTINAL ENDOSCOPY      Prior to Admission medications   Medication Sig Start Date End Date Taking? Authorizing Provider  acetaminophen (TYLENOL) 500 MG tablet Take 1,000 mg by mouth every 6 (six) hours as needed. 05/19/18  Yes [provider]  Ascorbic Acid (VITAMIN C) 1000 MG tablet Take by mouth.   Yes [provider]  Calcium  Carb-Cholecalciferol (CALCIUM 600 + D PO) Take by mouth daily.   Yes [provider]  Cholecalciferol 50 MCG (2000 UT) CAPS Take by mouth.   Yes [provider]  denosumab (PROLIA) 60 MG/ML SOSY injection Inject 60 mg into the skin every 6 (six) months.   Yes [provider]  diazepam (VALIUM) 5 MG tablet Take 1 tablet (5 mg total) by mouth daily as needed for anxiety. 05/24/18  Yes Gerlene Fee, NP  ferrous gluconate (FERGON) 240 (27 FE) MG tablet Take 240 mg by mouth daily.  09/09/17  Yes [provider]  loperamide (IMODIUM) 2 MG capsule Take 2 mg by mouth as needed for diarrhea or loose stools.   Yes [provider]  magnesium oxide (MAG-OX) 400 MG tablet Take 400 mg by mouth daily.   Yes [provider]  Multiple Vitamins-Minerals (PRESERVISION AREDS 2 PO) Take 1 tablet by mouth daily.   Yes [provider]  omeprazole (PRILOSEC) 20 MG capsule Take 2 tablets by mouth every morning (30 minutes before breakfast) and 1 tablet by mouth at bedtime. 07/03/13  Yes Lafayette Dragon, MD  simvastatin (ZOCOR) 40 MG tablet Take 40 mg by mouth at bedtime.     Yes [provider]  temazepam (RESTORIL) 15 MG capsule Take 1 capsule (15 mg total) by mouth at bedtime as needed for sleep.  05/24/18  Yes Gerlene Fee, NP    Allergies as of 11/14/2018 - Review Complete 10/24/2018  Allergen Reaction Noted  . Codeine  08/23/2008  . Hydrocodone-acetaminophen Other (See Comments) 04/25/2015  . Lidocaine  09/14/2018  . Nsaids Other (See Comments) 05/15/2018    Family History  Problem Relation Age of Onset  . Diabetes Mother   . Heart disease Mother   . Hypertension Mother   . Diabetes Son   . Diabetes Sister   . Inflammatory bowel disease Father        diverticulitis  . Multiple myeloma Father   . Hypertension Son   . Diabetes Maternal Grandmother   . Colon cancer Neg Hx   . Esophageal cancer Neg Hx   . Rectal cancer Neg Hx   .  Stomach cancer Neg Hx   . Breast cancer Neg Hx     Social History   Socioeconomic History  . Marital status: Single    Spouse name: Not on file  . Number of children: 2  . Years of education: Not on file  . Highest education level: Not on file  Occupational History  . Occupation: Retired  Scientific laboratory technician  . Financial resource strain: Not on file  . Food insecurity    Worry: Not on file    Inability: Not on file  . Transportation needs    Medical: Not on file    Non-medical: Not on file  Tobacco Use  . Smoking status: Current Every Day Smoker    Packs/day: 0.50    Years: 55.00    Pack years: 27.50    Types: Cigarettes  . Smokeless tobacco: Never Used  . Tobacco comment: she has patches to start just not started yet. smoked since teenager.  Substance and Sexual Activity  . Alcohol use: Yes    Alcohol/week: 2.0 standard drinks    Types: 2 Glasses of wine per week    Comment: socially  . Drug use: No  . Sexual activity: Not on file  Lifestyle  . Physical activity    Days per week: Not on file    Minutes per session: Not on file  . Stress: Not on file  Relationships  . Social Herbalist on phone: Not on file    Gets together: Not on file    Attends religious service: Not on file    Active member of club or organization: Not on file    Attends meetings of clubs or organizations: Not on file    Relationship status: Not on file  . Intimate partner violence    Fear of current or ex partner: Not on file    Emotionally abused: Not on file    Physically abused: Not on file    Forced sexual activity: Not on file  Other Topics Concern  . Not on file  Social History Narrative   Independent at baseline.  Lives at home with her husband    Review of Systems: See HPI, otherwise negative ROS  Physical Exam: BP 130/83   Pulse 65   Temp 97.7 F (36.5 C) (Temporal)   Ht 5' 2"  (1.575 m)   Wt 43.5 kg   SpO2 99%   BMI 17.56 kg/m  General:   Alert,  pleasant and  cooperative in NAD Head:  Normocephalic and atraumatic. Neck:  Supple; no masses or thyromegaly. Lungs:  Clear throughout to auscultation.    Heart:  Regular rate and rhythm. Abdomen:  Soft, nontender and  nondistended. Normal bowel sounds, without guarding, and without rebound.   Neurologic:  Alert and  oriented x4;  grossly normal neurologically.  Impression/Plan: Yolanda Jimenez is here for an colonoscopy to be performed for diarrhea  Risks, benefits, limitations, and alternatives regarding  colonoscopy have been reviewed with the patient.  Questions have been answered.  All parties agreeable.   Lucilla Lame, MD  01/22/2019, 7:51 AM

## 2019-01-22 NOTE — Op Note (Signed)
[facility] Gastroenterology Patient Name: Yolanda Jimenez Procedure Date: 01/22/2019 8:21 AM MRN: [id number] Account #: [id number] Date of Birth: Dec 12, 1945 Admit Type: Outpatient Age: 73 Room: [facility] OR ROOM 01 Gender: Female Note Status: Finalized Procedure:            Colonoscopy Indications:          Chronic diarrhea Providers:            Lucilla Lame MD, MD Referring MD:         Rusty Aus, MD (Referring MD) Medicines:            Propofol per Anesthesia Complications:        No immediate complications. Procedure:            Pre-Anesthesia Assessment:                       - Prior to the procedure, a History and Physical was                        performed, and patient medications and allergies were                        reviewed. The patient's tolerance of previous                        anesthesia was also reviewed. The risks and benefits of                        the procedure and the sedation options and risks were                        discussed with the patient. All questions were                        answered, and informed consent was obtained. Prior                        Anticoagulants: The patient has taken no previous                        anticoagulant or antiplatelet agents. ASA Grade                        Assessment: II - A patient with mild systemic disease.                        After reviewing the risks and benefits, the patient was                        deemed in satisfactory condition to undergo the                        procedure.                       After obtaining informed consent, the colonoscope was                        passed under direct vision. Throughout the procedure,  the patient's blood pressure, pulse, and oxygen                        saturations were monitored continuously. The was                        introduced through the anus and advanced to the the                        terminal  ileum. The colonoscopy was performed without                        difficulty. The patient tolerated the procedure well.                        The quality of the bowel preparation was excellent. Findings:      The perianal and digital rectal examinations were normal.      A 3 mm polyp was found in the ascending colon. The polyp was sessile.       The polyp was removed with a cold biopsy forceps. Resection and       retrieval were complete.      The terminal ileum appeared normal. Biopsies were taken with a cold       forceps for histology.      A 15 mm polyp was found in the ascending colon. The polyp was sessile.       Area was successfully injected with 10 mL saline with indigo carmine for       a lift polypectomy. The polyp was removed with a hot snare. Resection       and retrieval were complete. To prevent bleeding post-intervention, two       hemostatic clips were successfully placed (MR conditional). There was no       bleeding at the end of the procedure.      Multiple small-mouthed diverticula were found in the sigmoid colon.      Non-bleeding internal hemorrhoids were found during retroflexion. The       hemorrhoids were Grade II (internal hemorrhoids that prolapse but reduce       spontaneously).      Biopsies were taken with a cold forceps in the entire colon for       histology. Impression:           - One 3 mm polyp in the ascending colon, removed with a                        cold biopsy forceps. Resected and retrieved.                       - The examined portion of the ileum was normal.                        Biopsied.                       - One 15 mm polyp in the ascending colon, removed with                        a hot snare. Resected and retrieved. Injected. Clips                        (  MR conditional) were placed.                       - Diverticulosis in the sigmoid colon.                       - Non-bleeding internal hemorrhoids.                       -  Biopsies were taken with a cold forceps for histology                        in the entire colon. Recommendation:       - Discharge patient to home.                       - Resume previous diet.                       - Continue present medications.                       - Await pathology results. Procedure Code(s):    --- Professional ---                       [phone], Colonoscopy, flexible; with removal of tumor(s),                        polyp(s), or other lesion(s) by snare technique                       45380, 10, Colonoscopy, flexible; with biopsy, single                        or multiple                       45381, Colonoscopy, flexible; with directed submucosal                        injection(s), any substance Diagnosis Code(s):    --- Professional ---                       K52.9, Noninfective gastroenteritis and colitis,                        unspecified                       K63.5, Polyp of colon CPT copyright 2019 American Medical Association. All rights reserved. The codes documented in this report are preliminary and upon coder review may  be revised to meet current compliance requirements. Lucilla Lame MD, MD 01/22/2019 9:10:48 AM This report has been signed electronically. Number of Addenda: 0 Note Initiated On: 01/22/2019 8:21 AM Scope Withdrawal Time: 0 hours 25 minutes 19 seconds  Total Procedure Duration: 0 hours 31 minutes 32 seconds  Estimated Blood Loss: Estimated blood loss: none.      [facility]

## 2019-01-22 NOTE — Anesthesia Procedure Notes (Signed)
Procedure Name: MAC Date/Time: 01/22/2019 8:28 AM Performed by: Janna Arch, CRNA Pre-anesthesia Checklist: Patient identified, Emergency Drugs available, Suction available, Timeout performed and Patient being monitored Patient Re-evaluated:Patient Re-evaluated prior to induction Oxygen Delivery Method: Nasal cannula Placement Confirmation: positive ETCO2

## 2019-01-22 NOTE — Transfer of Care (Signed)
Immediate Anesthesia Transfer of Care Note  Patient: Yolanda Jimenez  Procedure(s) Performed: COLONOSCOPY WITH BIOPSY (N/A Rectum) POLYPECTOMY (N/A Rectum)  Patient Location: PACU  Anesthesia Type: General  Level of Consciousness: awake, alert  and patient cooperative  Airway and Oxygen Therapy: Patient Spontanous Breathing and Patient connected to supplemental oxygen  Post-op Assessment: Post-op Vital signs reviewed, Patient's Cardiovascular Status Stable, Respiratory Function Stable, Patent Airway and No signs of Nausea or vomiting  Post-op Vital Signs: Reviewed and stable  Complications: No apparent anesthesia complications

## 2019-01-23 ENCOUNTER — Encounter: Payer: Self-pay | Admitting: Gastroenterology

## 2019-01-24 ENCOUNTER — Encounter: Payer: Self-pay | Admitting: Gastroenterology

## 2019-03-27 ENCOUNTER — Ambulatory Visit: Payer: Medicare Other | Admitting: Urology

## 2019-06-26 ENCOUNTER — Other Ambulatory Visit: Payer: Self-pay

## 2019-06-26 DIAGNOSIS — Z20822 Contact with and (suspected) exposure to covid-19: Secondary | ICD-10-CM

## 2019-06-27 LAB — NOVEL CORONAVIRUS, NAA: SARS-CoV-2, NAA: NOT DETECTED

## 2019-09-18 ENCOUNTER — Other Ambulatory Visit: Payer: Self-pay

## 2019-09-18 ENCOUNTER — Encounter: Payer: Self-pay | Admitting: Urology

## 2019-09-18 ENCOUNTER — Ambulatory Visit: Payer: Medicare PPO | Admitting: Urology

## 2019-09-18 VITALS — BP 131/85 | HR 66 | Ht 62.0 in | Wt 100.0 lb

## 2019-09-18 DIAGNOSIS — Z87448 Personal history of other diseases of urinary system: Secondary | ICD-10-CM | POA: Diagnosis not present

## 2019-09-18 DIAGNOSIS — R351 Nocturia: Secondary | ICD-10-CM

## 2019-09-18 LAB — MICROSCOPIC EXAMINATION: Bacteria, UA: NONE SEEN

## 2019-09-18 LAB — URINALYSIS, COMPLETE
Bilirubin, UA: NEGATIVE
Glucose, UA: NEGATIVE
Ketones, UA: NEGATIVE
Leukocytes,UA: NEGATIVE
Nitrite, UA: NEGATIVE
Protein,UA: NEGATIVE
Specific Gravity, UA: 1.01 (ref 1.005–1.030)
Urobilinogen, Ur: 0.2 mg/dL (ref 0.2–1.0)
pH, UA: 5.5 (ref 5.0–7.5)

## 2019-09-18 MED ORDER — MIRABEGRON ER 25 MG PO TB24: 25.0000 mg | ORAL_TABLET | Freq: Every day | ORAL | 0 refills | Status: DC

## 2019-09-18 NOTE — Progress Notes (Signed)
09/18/2019 4:44 PM   Yolanda Jimenez 12-28-1945 [id number]  Referring provider: Rusty Aus, MD [facility] [place],  [place] [address]  Chief Complaint  Patient presents with  . Follow-up    HPI: Yolanda Jimenez is a 74 year old female with a history of hematuria and a renal cyst who presents today for follow up.  History of hematuria (high risk) Smoker.  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 No other significant renal abnormality. Slight enlargement of the hypodense exophytic anterior renal cyst on the left kidney lower pole measuring 3 cm. No other significant renal abnormality, obstruction, hydronephrosis. No surrounding perinephric edema.  Ureters are nondilated. No hydroureter, ureteral obstruction, hydronephrosis. Bladder grossly unremarkable. Portions of the bladder obscured by artifact from a left hip replacement hardware.  Cysto 09/2018 NED.   She does not report any gross hematuria.  Her UA with 0-2 RBC's/hpf.   Urine is sent for cytology.    Left renal cyst Slight enlargement of left renal cyst  2.9 cm simple cyst on 2019 RUS.   Contrast CT in 07/2018 cyst 3 cm.    She was having intense dysuria in January and dropped off an UA with  Dr. Ammie Ferrier office.  She grew out pan-sensitive E. Coli for which she was given Cipro.  She is asymptomatic at this time  Today, she is experiencing frequency, nocturia for months x 2-5, incontinence a little dribble and a weak urinary stream.  Patient denies any modifying or aggravating factors.  Patient denies any gross hematuria, dysuria or suprapubic/flank pain.  Patient denies any fevers, chills, nausea or vomiting.   Her UA today is yellow clear, specific gravity 1.010, 1+ blood, 0-5 WBCs per high-power field, 0-2 RBCs per high-power field, 0-10 epithelial cells per high-power field and 0-10 renal epithelial cells per high-power field.   PMH: Past Medical History:   Diagnosis Date  . AVM (arteriovenous malformation)   . Barrett esophagus   . C. difficile diarrhea   . Carotid stenosis   . Diabetes mellitus without complication ([place])   . Diverticulosis   . GERD (gastroesophageal reflux disease)   . Heart murmur   . Hx of adenomatous colonic polyps   . Hyperlipidemia   . IBS (irritable bowel syndrome)   . Insomnia   . Iron deficiency anemia   . Kidney cysts    hx UTIs  . Microscopic hematuria   . Osteoporosis    mild  . PVD (peripheral vascular disease) ([place])   . Wears dentures    partial lower    Surgical History: Past Surgical History:  Procedure Laterality Date  . BUNIONECTOMY     right foot  . CATARACT EXTRACTION     both eyes  . CERVICAL CONE BIOPSY    . CHOLECYSTECTOMY    . COLONOSCOPY    . COLONOSCOPY WITH PROPOFOL N/A 01/22/2019   Procedure: COLONOSCOPY WITH BIOPSY;  Surgeon: Lucilla Lame, MD;  Location: [place];  Service: Endoscopy;  Laterality: N/A;  diabetic - diet controlled  . ESOPHAGOGASTRODUODENOSCOPY (EGD) WITH PROPOFOL N/A 08/29/2017   Procedure: ESOPHAGOGASTRODUODENOSCOPY (EGD) WITH PROPOFOL;  Surgeon: Lucilla Lame, MD;  Location: [place];  Service: Endoscopy;  Laterality: N/A;  . FOOT SURGERY     right 2nd toe  . HIP ARTHROPLASTY Left 05/16/2018   Procedure: ARTHROPLASTY BIPOLAR HIP (HEMIARTHROPLASTY);  Surgeon: Corky Mull, MD;  Location: [facility];  Service: Orthopedics;  Laterality:  Left;  . POLYPECTOMY N/A 01/22/2019   Procedure: POLYPECTOMY;  Surgeon: Lucilla Lame, MD;  Location: [place];  Service: Endoscopy;  Laterality: N/A;  2 Clips placed at polyp removal site in ascending colon  . TUBAL LIGATION    . UPPER GASTROINTESTINAL ENDOSCOPY      Home Medications:  Allergies as of 09/18/2019      Reactions   Codeine    "felt drunk, crazy"   Hydrocodone-acetaminophen Other (See Comments)   Confusion/delirium   Lidocaine    Heart racing, increased BP - from local at dentist  (likely epi)   Nsaids Other (See Comments)   States it makes her fell drunk      Medication List       Accurate as of September 18, 2019  4:44 PM. If you have any questions, ask your nurse or doctor.        STOP taking these medications   denosumab 60 MG/ML Sosy injection Commonly known as: PROLIA Stopped by: Zara Council, PA-C     TAKE these medications   acetaminophen 500 MG tablet Commonly known as: TYLENOL Take 1,000 mg by mouth every 6 (six) hours as needed.   CALCIUM 600 + D PO Take by mouth daily.   Cholecalciferol 50 MCG (2000 UT) Caps Take by mouth.   diazepam 5 MG tablet Commonly known as: VALIUM Take 1 tablet (5 mg total) by mouth daily as needed for anxiety.   diltiazem 120 MG 24 hr capsule Commonly known as: CARDIZEM CD Take 120 mg by mouth at bedtime.   ferrous gluconate 240 (27 FE) MG tablet Commonly known as: FERGON Take 240 mg by mouth daily.   loperamide 2 MG capsule Commonly known as: IMODIUM Take 2 mg by mouth as needed for diarrhea or loose stools.   magnesium oxide 400 MG tablet Commonly known as: MAG-OX Take 400 mg by mouth daily.   mirabegron ER 25 MG Tb24 tablet Commonly known as: MYRBETRIQ Take 1 tablet (25 mg total) by mouth daily. Started by: Zara Council, PA-C   omeprazole 20 MG capsule Commonly known as: PRILOSEC Take 2 tablets by mouth every morning (30 minutes before breakfast) and 1 tablet by mouth at bedtime.   PRESERVISION AREDS 2 PO Take 1 tablet by mouth daily.   simvastatin 40 MG tablet Commonly known as: ZOCOR Take 40 mg by mouth at bedtime.   temazepam 15 MG capsule Commonly known as: RESTORIL Take 1 capsule (15 mg total) by mouth at bedtime as needed for sleep.   vitamin C 1000 MG tablet Take by mouth.       Allergies:  Allergies  Allergen Reactions  . Codeine     "felt drunk, crazy"  . Hydrocodone-Acetaminophen Other (See Comments)    Confusion/delirium  . Lidocaine     Heart racing,  increased BP - from local at dentist (likely epi)  . Nsaids Other (See Comments)    States it makes her fell drunk    Family History: Family History  Problem Relation Age of Onset  . Diabetes Mother   . Heart disease Mother   . Hypertension Mother   . Diabetes Son   . Diabetes Sister   . Inflammatory bowel disease Father        diverticulitis  . Multiple myeloma Father   . Hypertension Son   . Diabetes Maternal Grandmother   . Colon cancer Neg Hx   . Esophageal cancer Neg Hx   . Rectal cancer Neg Hx   .  Stomach cancer Neg Hx   . Breast cancer Neg Hx     Social History:  reports that she has been smoking cigarettes. She has a 27.50 pack-year smoking history. She has never used smokeless tobacco. She reports current alcohol use of about 2.0 standard drinks of alcohol per week. She reports that she does not use drugs.  ROS: UROLOGY Frequent Urination?: Yes Hard to postpone urination?: No Burning/pain with urination?: No Get up at night to urinate?: Yes Leakage of urine?: Yes Urine stream starts and stops?: No Trouble starting stream?: No Do you have to strain to urinate?: No Blood in urine?: No Urinary tract infection?: No Sexually transmitted disease?: No Injury to kidneys or bladder?: No Painful intercourse?: No Weak stream?: Yes Currently pregnant?: No Vaginal bleeding?: No Last menstrual period?: n  Gastrointestinal Nausea?: No Vomiting?: No Indigestion/heartburn?: No Diarrhea?: No Constipation?: No  Constitutional Fever: No Night sweats?: No Weight loss?: No Fatigue?: No  Skin Skin rash/lesions?: No Itching?: No  Eyes Blurred vision?: No Double vision?: No  Ears/Nose/Throat Sore throat?: No Sinus problems?: No  Hematologic/Lymphatic Swollen glands?: No Easy bruising?: No  Cardiovascular Leg swelling?: No Chest pain?: No  Respiratory Cough?: No Shortness of breath?: No  Endocrine Excessive thirst?: No  Musculoskeletal Back  pain?: No Joint pain?: No  Neurological Headaches?: No Dizziness?: No  Psychologic Depression?: No Anxiety?: No  Physical Exam: BP 131/85   Pulse 66   Ht 5' 2" (1.575 m)   Wt 100 lb (45.4 kg)   BMI 18.29 kg/m   Constitutional:  Well nourished. Alert and oriented, No acute distress. HEENT: [place] AT, mask in place.  Trachea midline, no masses. Cardiovascular: No clubbing, cyanosis, or edema. Respiratory: Normal respiratory effort, no increased work of breathing. Neurologic: Grossly intact, no focal deficits, moving all 4 extremities. Psychiatric: Normal mood and affect.  Laboratory Data: Lab Results  Component Value Date   WBC 10.7 (H) 05/18/2018   HGB 11.9 (L) 05/18/2018   HCT 36.5 05/18/2018   MCV 97.6 05/18/2018   PLT 226 05/18/2018    Lab Results  Component Value Date   CREATININE 0.80 07/12/2018    No results found for: PSA  No results found for: TESTOSTERONE  No results found for: HGBA1C  No results found for: TSH  No results found for: CHOL, HDL, CHOLHDL, VLDL, LDLCALC  Lab Results  Component Value Date   AST 25 05/15/2018   Lab Results  Component Value Date   ALT 16 05/15/2018   No components found for: ALKALINEPHOPHATASE No components found for: BILIRUBINTOTAL  No results found for: ESTRADIOL  Urinalysis Component     Latest Ref Rng & Units 09/18/2019  Specific Gravity, UA     1.005 - 1.030 1.010  pH, UA     5.0 - 7.5 5.5  Color, UA     Yellow Yellow  Appearance Ur     Clear Clear  Leukocytes,UA     Negative Negative  Protein,UA     Negative/Trace Negative  Glucose, UA     Negative Negative  Ketones, UA     Negative Negative  RBC, UA     Negative 1+ (A)  Bilirubin, UA     Negative Negative  Urobilinogen, Ur     0.2 - 1.0 mg/dL 0.2  Nitrite, UA     Negative Negative  Microscopic Examination      See below:   Component     Latest Ref Rng & Units 09/18/2019  WBC, UA  0 - 5 /hpf 0-5  RBC     0 - 2 /hpf 0-2  Epithelial  Cells (non renal)     0 - 10 /hpf 0-10  Renal Epithel, UA     None seen /hpf 0-10 (A)  Bacteria, UA     None seen/Few None seen   I have reviewed the labs.    Assessment & Plan:   1. History of hematuria (high risk) - Urinalysis, Complete - Urine cytology pending - cystoscopy to be performed every other year - will schedule for next year (2022) - may consider cysto q 2-3 years and possible upper tract imaging with annual UA/ cytology  2. Nocturia I have suggested that the patient avoid caffeine after noon.  She may also benefit from fluid restrictions after 6:00 in the evening and voiding just prior to bedtime. Explained that over 84% of patients with sleep apnea but she has not been tested for sleep apnea and does not risk factors for the condition at this time  We will have a trial of Myrbetriq 25 mg daily, # 28 samples given and I given her a written prescription for the medication to carry to her pharmacy to check on cost She will return in 3 weeks for OAB questionnaire negative   Return in about 3 weeks (around 10/09/2019) for PVR and OAB questionnaire.  These notes generated with voice recognition software. I apologize for typographical errors.  Zara Council, PA-C  [facility] [address]  [place], [place] [address] [phone]

## 2019-09-21 ENCOUNTER — Other Ambulatory Visit: Payer: Self-pay | Admitting: Urology

## 2019-09-24 ENCOUNTER — Telehealth: Payer: Self-pay | Admitting: Family Medicine

## 2019-09-24 NOTE — Telephone Encounter (Signed)
Patient notified and voiced understanding.

## 2019-09-24 NOTE — Telephone Encounter (Signed)
-----   Message from Nori Riis, PA-C sent at 09/24/2019 11:12 AM EST ----- Please let Yolanda Jimenez know that her urine cytology was negative. ----- Message ----- From: Benard Halsted Sent: 09/21/2019   2:44 PM EST To: Nori Riis, PA-C

## 2019-10-09 ENCOUNTER — Other Ambulatory Visit: Payer: Self-pay | Admitting: Urology

## 2019-10-09 ENCOUNTER — Other Ambulatory Visit: Payer: Self-pay

## 2019-10-09 ENCOUNTER — Encounter: Payer: Self-pay | Admitting: Urology

## 2019-10-09 ENCOUNTER — Ambulatory Visit: Payer: Medicare PPO | Admitting: Urology

## 2019-10-09 VITALS — BP 135/82 | HR 62 | Ht 62.0 in | Wt 100.0 lb

## 2019-10-09 DIAGNOSIS — R3129 Other microscopic hematuria: Secondary | ICD-10-CM | POA: Diagnosis not present

## 2019-10-09 DIAGNOSIS — R351 Nocturia: Secondary | ICD-10-CM

## 2019-10-09 LAB — BLADDER SCAN AMB NON-IMAGING: Scan Result: 12

## 2019-10-09 MED ORDER — MIRABEGRON ER 25 MG PO TB24: 25.0000 mg | ORAL_TABLET | Freq: Every day | ORAL | 3 refills | Status: DC

## 2019-10-09 NOTE — Progress Notes (Signed)
10/09/2019 9:51 AM   Yolanda Jimenez [birth year] [id number]  Referring provider: Rusty Aus, MD [facility] [place],  [place] [address]  Chief Complaint  Patient presents with  . Nocturia    HPI: Yolanda Jimenez is a 74 year old female with a history of hematuria and a renal cyst who presents today for follow up.  History of hematuria (high risk) Smoker.  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 No other significant renal abnormality. Slight enlargement of the hypodense exophytic anterior renal cyst on the left kidney lower pole measuring 3 cm. No other significant renal abnormality, obstruction, hydronephrosis. No surrounding perinephric edema.  Ureters are nondilated. No hydroureter, ureteral obstruction, hydronephrosis. Bladder grossly unremarkable. Portions of the bladder obscured by artifact from a left hip replacement hardware.  Cysto 09/2018 NED.   Her UA on 09/18/2019 0-2 RBC's/hpf - although UA's with PCP's office consistent with 10-50 RBC's.  Urine cytology was negative on 09/18/2019.    Left renal cyst Slight enlargement of left renal cyst  2.9 cm simple cyst on 2019 RUS.   Contrast CT in 07/2018 cyst 3 cm.    She was having intense dysuria in January and dropped off an UA with  Yolanda Jimenez office.  She grew out pan-sensitive E. Coli for which she was given Cipro.  She is asymptomatic at this time  At her visit on 09/18/2019, she was experiencing frequency, nocturia for months x 2-5, incontinence a little dribble and a weak urinary stream.  Patient denies any modifying or aggravating factors.  Patient denies any gross hematuria, dysuria or suprapubic/flank pain.  Patient denies any fevers, chills, nausea or vomiting.   Her UA today was yellow clear, specific gravity 1.010, 1+ blood, 0-5 WBCs per high-power field, 0-2 RBCs per high-power field, 0-10 epithelial cells per high-power field and 0-10 renal  epithelial cells per high-power field.  She was given a trial of Myrbetriq 25 mg daily and encouraged to avoid caffeine after noon and restrict fluids after 6 pm.    Today, she is experiencing urgency x 0-3, frequency x 4-7, not restricting fluids to avoid visits to the restroom, not engaging in toilet mapping, incontinence x 0-3 and nocturia x 0-3 most nights and some nights 4-7.     Her BP is 135/82.   Her PVR is 12 mL.   Patient denies any modifying or aggravating factors.  Patient denies any gross hematuria, dysuria or suprapubic/flank pain.  Patient denies any fevers, chills, nausea or vomiting.    PMH: Past Medical History:  Diagnosis Date  . AVM (arteriovenous malformation)   . Barrett esophagus   . C. difficile diarrhea   . Carotid stenosis   . Diabetes mellitus without complication ([place])   . Diverticulosis   . GERD (gastroesophageal reflux disease)   . Heart murmur   . Hx of adenomatous colonic polyps   . Hyperlipidemia   . IBS (irritable bowel syndrome)   . Insomnia   . Iron deficiency anemia   . Kidney cysts    hx UTIs  . Microscopic hematuria   . Osteoporosis    mild  . PVD (peripheral vascular disease) ([place])   . Wears dentures    partial lower    Surgical History: Past Surgical History:  Procedure Laterality Date  . BUNIONECTOMY     right foot  . CATARACT EXTRACTION     both eyes  . CERVICAL CONE BIOPSY    .  CHOLECYSTECTOMY    . COLONOSCOPY    . COLONOSCOPY WITH PROPOFOL N/A 01/22/2019   Procedure: COLONOSCOPY WITH BIOPSY;  Surgeon: Lucilla Lame, MD;  Location: [facility];  Service: Endoscopy;  Laterality: N/A;  diabetic - diet controlled  . ESOPHAGOGASTRODUODENOSCOPY (EGD) WITH PROPOFOL N/A 08/29/2017   Procedure: ESOPHAGOGASTRODUODENOSCOPY (EGD) WITH PROPOFOL;  Surgeon: Lucilla Lame, MD;  Location: [facility];  Service: Endoscopy;  Laterality: N/A;  . FOOT SURGERY     right 2nd toe  . HIP ARTHROPLASTY Left 05/16/2018   Procedure:  ARTHROPLASTY BIPOLAR HIP (HEMIARTHROPLASTY);  Surgeon: Corky Mull, MD;  Location: [facility];  Service: Orthopedics;  Laterality: Left;  . POLYPECTOMY N/A 01/22/2019   Procedure: POLYPECTOMY;  Surgeon: Lucilla Lame, MD;  Location: [facility];  Service: Endoscopy;  Laterality: N/A;  2 Clips placed at polyp removal site in ascending colon  . TUBAL LIGATION    . UPPER GASTROINTESTINAL ENDOSCOPY      Home Medications:  Allergies as of 10/09/2019      Reactions   Codeine    "felt drunk, crazy"   Hydrocodone-acetaminophen Other (See Comments)   Confusion/delirium   Lidocaine    Heart racing, increased BP - from local at dentist (likely epi)   Nsaids Other (See Comments)   States it makes her fell drunk      Medication List       Accurate as of October 09, 2019  9:51 AM. If you have any questions, ask your nurse or doctor.        acetaminophen 500 MG tablet Commonly known as: TYLENOL Take 1,000 mg by mouth every 6 (six) hours as needed.   CALCIUM 600 + D PO Take by mouth daily.   Cholecalciferol 50 MCG (2000 UT) Caps Take by mouth.   diazepam 5 MG tablet Commonly known as: VALIUM Take 1 tablet (5 mg total) by mouth daily as needed for anxiety.   diltiazem 120 MG 24 hr capsule Commonly known as: CARDIZEM CD Take 120 mg by mouth at bedtime.   donepezil 5 MG tablet Commonly known as: ARICEPT Take by mouth.   ferrous gluconate 240 (27 FE) MG tablet Commonly known as: FERGON Take 240 mg by mouth daily.   loperamide 2 MG capsule Commonly known as: IMODIUM Take 2 mg by mouth as needed for diarrhea or loose stools.   magnesium oxide 400 MG tablet Commonly known as: MAG-OX Take 400 mg by mouth daily.   mirabegron ER 25 MG Tb24 tablet Commonly known as: MYRBETRIQ Take 1 tablet (25 mg total) by mouth daily.   omeprazole 20 MG capsule Commonly known as: PRILOSEC Take 2 tablets by mouth every morning (30 minutes before breakfast) and 1 tablet by mouth at bedtime.     PRESERVISION AREDS 2 PO Take 1 tablet by mouth daily.   simvastatin 40 MG tablet Commonly known as: ZOCOR Take 40 mg by mouth at bedtime.   temazepam 15 MG capsule Commonly known as: RESTORIL Take 1 capsule (15 mg total) by mouth at bedtime as needed for sleep.   vitamin C 1000 MG tablet Take by mouth.       Allergies:  Allergies  Allergen Reactions  . Codeine     "felt drunk, crazy"  . Hydrocodone-Acetaminophen Other (See Comments)    Confusion/delirium  . Lidocaine     Heart racing, increased BP - from local at dentist (likely epi)  . Nsaids Other (See Comments)    States it makes her fell  drunk    Family History: Family History  Problem Relation Age of Onset  . Diabetes Mother   . Heart disease Mother   . Hypertension Mother   . Diabetes Son   . Diabetes Sister   . Inflammatory bowel disease Father        diverticulitis  . Multiple myeloma Father   . Hypertension Son   . Diabetes Maternal Grandmother   . Colon cancer Neg Hx   . Esophageal cancer Neg Hx   . Rectal cancer Neg Hx   . Stomach cancer Neg Hx   . Breast cancer Neg Hx     Social History:  reports that she has been smoking cigarettes. She has a 27.50 pack-year smoking history. She has never used smokeless tobacco. She reports current alcohol use of about 2.0 standard drinks of alcohol per week. She reports that she does not use drugs.  ROS: For pertinent review of systems please refer to history of present illness  Physical Exam: BP 135/82   Pulse 62   Ht 5' 2"  (1.575 m)   Wt 100 lb (45.4 kg)   BMI 18.29 kg/m  Constitutional:  Well nourished. Alert and oriented, No acute distress. HEENT: Needles AT, mask in place.  Trachea midline, no masses. Cardiovascular: No clubbing, cyanosis, or edema. Respiratory: Normal respiratory effort, no increased work of breathing. Neurologic: Grossly intact, no focal deficits, moving all 4 extremities. Psychiatric: Normal mood and affect.  Laboratory  Data: Lab Results  Component Value Date   WBC 10.7 (H) 05/18/2018   HGB 11.9 (L) 05/18/2018   HCT 36.5 05/18/2018   MCV 97.6 05/18/2018   PLT 226 05/18/2018    Lab Results  Component Value Date   CREATININE 0.80 07/12/2018    Lab Results  Component Value Date   AST 25 05/15/2018   Lab Results  Component Value Date   ALT 16 05/15/2018    Urinalysis 10-50 RBC's over the last several UA's I have reviewed the labs.  Assessment & Plan:   1. Microscopic hematuria (high risk) - Urine cytology negative - cystoscopy to be performed every other year - next cystoscopy scheduled for 09/17/2020 - may consider cysto q 2-3 years and possible upper tract imaging with annual UA/ cytology  2. Nocturia Some success with the Myrbetriq 25 mg daily She would like to continue the Myrbetriq 25 mg daily - prescription for 90 days with 3 refills sent to her pharmacy  She will notify us if she feels the Myrbetriq is ineffective   Return for keep cystoscopy appointment with Dr. Erlene Quan 09/17/2020.  These notes generated with voice recognition software. I apologize for typographical errors.  Zara Council, PA-C  [facility] [address]  [place], [place] [address] [phone]

## 2019-10-26 ENCOUNTER — Ambulatory Visit (INDEPENDENT_AMBULATORY_CARE_PROVIDER_SITE_OTHER): Payer: Medicare PPO

## 2019-10-26 ENCOUNTER — Encounter (INDEPENDENT_AMBULATORY_CARE_PROVIDER_SITE_OTHER): Payer: Self-pay | Admitting: Vascular Surgery

## 2019-10-26 ENCOUNTER — Ambulatory Visit (INDEPENDENT_AMBULATORY_CARE_PROVIDER_SITE_OTHER): Payer: Medicare Other | Admitting: Vascular Surgery

## 2019-10-26 ENCOUNTER — Other Ambulatory Visit: Payer: Self-pay

## 2019-10-26 VITALS — BP 143/82 | HR 57 | Resp 16 | Wt 101.0 lb

## 2019-10-26 DIAGNOSIS — I739 Peripheral vascular disease, unspecified: Secondary | ICD-10-CM

## 2019-10-26 DIAGNOSIS — E785 Hyperlipidemia, unspecified: Secondary | ICD-10-CM | POA: Diagnosis not present

## 2019-10-26 DIAGNOSIS — E119 Type 2 diabetes mellitus without complications: Secondary | ICD-10-CM | POA: Diagnosis not present

## 2019-10-26 DIAGNOSIS — I6523 Occlusion and stenosis of bilateral carotid arteries: Secondary | ICD-10-CM | POA: Diagnosis not present

## 2019-10-26 NOTE — Assessment & Plan Note (Signed)
ABIs today remain in the normal range at 1.07 on the right and 1.04 on the left with triphasic waveforms.  Perfusion is well-maintained.  Known aortoiliac calcification.  We will continue to check her ABIs annually.

## 2019-10-26 NOTE — Progress Notes (Signed)
MRN : [id number]  Yolanda Jimenez is a 74 y.o. (1945-09-12) female who presents with chief complaint of  Chief Complaint  Patient presents with  . Follow-up    ultrasound follow up  .  History of Present Illness: Patient returns today in follow up of multiple vascular issues.  She is doing well today without complaints.  Mild numbness in her feet and toes but no disabling claudication, ulceration, or rest pain.  Known history of aortoiliac calcification.  ABIs today remain in the normal range at 1.07 on the right and 1.04 on the left with triphasic waveforms. She is also followed for mild carotid disease.  No new carotid symptoms. Specifically, the patient denies amaurosis fugax, speech or swallowing difficulties, or arm or leg weakness or numbness. Carotid disease in the 1 to 39% range bilaterally without significant progression from previous studies.  Current Outpatient Medications  Medication Sig Dispense Refill  . acetaminophen (TYLENOL) 500 MG tablet Take 1,000 mg by mouth every 6 (six) hours as needed.    . Ascorbic Acid (VITAMIN C) 1000 MG tablet Take by mouth.    . Calcium Carb-Cholecalciferol (CALCIUM 600 + D PO) Take by mouth daily.    . Cholecalciferol 50 MCG (2000 UT) CAPS Take by mouth.    . diazepam (VALIUM) 5 MG tablet Take 1 tablet (5 mg total) by mouth daily as needed for anxiety. 10 tablet 0  . diltiazem (CARDIZEM CD) 120 MG 24 hr capsule Take 120 mg by mouth at bedtime.    . donepezil (ARICEPT) 5 MG tablet Take by mouth.    . ferrous gluconate (FERGON) 240 (27 FE) MG tablet Take 240 mg by mouth daily.     [name] loperamide (IMODIUM) 2 MG capsule Take 2 mg by mouth as needed for diarrhea or loose stools.    . magnesium oxide (MAG-OX) 400 MG tablet Take 400 mg by mouth daily.    . mirabegron ER (MYRBETRIQ) 25 MG TB24 tablet Take 1 tablet (25 mg total) by mouth daily. 90 tablet 3  . Multiple Vitamins-Minerals (PRESERVISION AREDS 2 PO) Take 1 tablet by mouth daily.    [name]  omeprazole (PRILOSEC) 20 MG capsule Take 2 tablets by mouth every morning (30 minutes before breakfast) and 1 tablet by mouth at bedtime. 270 capsule 3  . simvastatin (ZOCOR) 40 MG tablet Take 40 mg by mouth at bedtime.      . temazepam (RESTORIL) 15 MG capsule Take 1 capsule (15 mg total) by mouth at bedtime as needed for sleep. 10 capsule 0   No current facility-administered medications for this visit.    Past Medical History:  Diagnosis Date  . AVM (arteriovenous malformation)   . Barrett esophagus   . C. difficile diarrhea   . Carotid stenosis   . Diabetes mellitus without complication ([place])   . Diverticulosis   . GERD (gastroesophageal reflux disease)   . Heart murmur   . Hx of adenomatous colonic polyps   . Hyperlipidemia   . IBS (irritable bowel syndrome)   . Insomnia   . Iron deficiency anemia   . Kidney cysts    hx UTIs  . Microscopic hematuria   . Osteoporosis    mild  . PVD (peripheral vascular disease) ([place])   . Wears dentures    partial lower    Past Surgical History:  Procedure Laterality Date  . BUNIONECTOMY     right foot  . CATARACT EXTRACTION     both eyes  .  CERVICAL CONE BIOPSY    . CHOLECYSTECTOMY    . COLONOSCOPY    . COLONOSCOPY WITH PROPOFOL N/A 01/22/2019   Procedure: COLONOSCOPY WITH BIOPSY;  Surgeon: Lucilla Lame, MD;  Location: [facility];  Service: Endoscopy;  Laterality: N/A;  diabetic - diet controlled  . ESOPHAGOGASTRODUODENOSCOPY (EGD) WITH PROPOFOL N/A 08/29/2017   Procedure: ESOPHAGOGASTRODUODENOSCOPY (EGD) WITH PROPOFOL;  Surgeon: Lucilla Lame, MD;  Location: [facility];  Service: Endoscopy;  Laterality: N/A;  . FOOT SURGERY     right 2nd toe  . HIP ARTHROPLASTY Left 05/16/2018   Procedure: ARTHROPLASTY BIPOLAR HIP (HEMIARTHROPLASTY);  Surgeon: Corky Mull, MD;  Location: [facility];  Service: Orthopedics;  Laterality: Left;  . POLYPECTOMY N/A 01/22/2019   Procedure: POLYPECTOMY;  Surgeon: Lucilla Lame, MD;  Location:  [facility];  Service: Endoscopy;  Laterality: N/A;  2 Clips placed at polyp removal site in ascending colon  . TUBAL LIGATION    . UPPER GASTROINTESTINAL ENDOSCOPY       Social History   Tobacco Use  . Smoking status: Current Every Day Smoker    Packs/day: 0.50    Years: 55.00    Pack years: 27.50    Types: Cigarettes  . Smokeless tobacco: Never Used  . Tobacco comment: she has patches to start just not started yet. smoked since teenager.  Substance Use Topics  . Alcohol use: Yes    Alcohol/week: 2.0 standard drinks    Types: 2 Glasses of wine per week    Comment: socially  . Drug use: No    Family History  Problem Relation Age of Onset  . Diabetes Mother   . Heart disease Mother   . Hypertension Mother   . Diabetes Son   . Diabetes Sister   . Inflammatory bowel disease Father        diverticulitis  . Multiple myeloma Father   . Hypertension Son   . Diabetes Maternal Grandmother   . Colon cancer Neg Hx   . Esophageal cancer Neg Hx   . Rectal cancer Neg Hx   . Stomach cancer Neg Hx   . Breast cancer Neg Hx      Allergies  Allergen Reactions  . Codeine     "felt drunk, crazy"  . Hydrocodone-Acetaminophen Other (See Comments)    Confusion/delirium  . Lidocaine     Heart racing, increased BP - from local at dentist (likely epi)  . Nsaids Other (See Comments)    States it makes her fell drunk     REVIEW OF SYSTEMS (Negative unless checked)  Constitutional: [] Weight loss  [] Fever  [] Chills Cardiac: [] Chest pain   [] Chest pressure   [] Palpitations   [] Shortness of breath when laying flat   [] Shortness of breath at rest   [] Shortness of breath with exertion. Vascular:  [] Pain in legs with walking   [] Pain in legs at rest   [] Pain in legs when laying flat   [] Claudication   [] Pain in feet when walking  [x] Pain in feet at rest  [x] Pain in feet when laying flat   [] History of DVT   [] Phlebitis   [] Swelling in legs   [] Varicose veins   [] Non-healing  ulcers Pulmonary:   [] Uses home oxygen   [] Productive cough   [] Hemoptysis   [] Wheeze  [] COPD   [] Asthma Neurologic:  [] Dizziness  [] Blackouts   [] Seizures   [] History of stroke   [] History of TIA  [] Aphasia   [] Temporary blindness   [] Dysphagia   [] Weakness or  numbness in arms   [] Weakness or numbness in legs Musculoskeletal:  [] Arthritis   [] Joint swelling   [] Joint pain   [] Low back pain Hematologic:  [] Easy bruising  [] Easy bleeding   [] Hypercoagulable state   [] Anemic   Gastrointestinal:  [] Blood in stool   [] Vomiting blood  [] Gastroesophageal reflux/heartburn   [] Abdominal pain Genitourinary:  [] Chronic kidney disease   [] Difficult urination  [] Frequent urination  [] Burning with urination   [] Hematuria Skin:  [] Rashes   [] Ulcers   [] Wounds Psychological:  [] History of anxiety   []  History of major depression.  Physical Examination  BP (!) 143/82 (BP Location: Right Arm)   Pulse (!) 57   Resp 16   Wt 101 lb (45.8 kg)   BMI 18.47 kg/m  Gen:  WD/WN, NAD. Appears younger than stated age. Head: [place]/AT, No temporalis wasting. Ear/Nose/Throat: Hearing grossly intact, nares w/o erythema or drainage Eyes: Conjunctiva clear. Sclera non-icteric Neck: Supple.  Trachea midline. No bruit.  Pulmonary:  Good air movement, no use of accessory muscles.  Cardiac: RRR, no JVD Vascular:  Vessel Right Left  Radial Palpable Palpable                          PT Palpable Palpable  DP Palpable Palpable    Musculoskeletal: M/S 5/5 throughout.  No deformity or atrophy. No edema. Neurologic: Sensation grossly intact in extremities.  Symmetrical.  Speech is fluent.  Psychiatric: Judgment intact, Mood & affect appropriate for pt's clinical situation. Dermatologic: No rashes or ulcers noted.  No cellulitis or open wounds.       Labs Recent Results (from the past 2160 hour(s))  Urinalysis, Complete     Status: Abnormal   Collection Time: 09/18/19  9:13 AM  Result Value Ref Range   Specific  Gravity, UA 1.010 1.005 - 1.030   pH, UA 5.5 5.0 - 7.5   Color, UA Yellow Yellow   Appearance Ur Clear Clear   Leukocytes,UA Negative Negative   Protein,UA Negative Negative/Trace   Glucose, UA Negative Negative   Ketones, UA Negative Negative   RBC, UA 1+ (A) Negative   Bilirubin, UA Negative Negative   Urobilinogen, Ur 0.2 0.2 - 1.0 mg/dL   Nitrite, UA Negative Negative   Microscopic Examination See below:   Microscopic Examination     Status: Abnormal   Collection Time: 09/18/19  9:13 AM   URINE  Result Value Ref Range   WBC, UA 0-5 0 - 5 /hpf   RBC 0-2 0 - 2 /hpf   Epithelial Cells (non renal) 0-10 0 - 10 /hpf   Renal Epithel, UA 0-10 (A) None seen /hpf   Bacteria, UA None seen None seen/Few  Bladder Scan (Post Void Residual) in office     Status: None   Collection Time: 10/09/19  9:18 AM  Result Value Ref Range   Scan Result 12     Radiology No results found.  Assessment/Plan Diabetes mellitus, type 2 blood glucose control important in reducing the progression of atherosclerotic disease. Also, involved in wound healing. On appropriate medications.   Hyperlipidemia lipid control important in reducing the progression of atherosclerotic disease. Continue statin therapy  PAD (peripheral artery disease) (HCC) ABIs today remain in the normal range at 1.07 on the right and 1.04 on the left with triphasic waveforms.  Perfusion is well-maintained.  Known aortoiliac calcification.  We will continue to check her ABIs annually.  Carotid stenosis Carotid disease in the 1 to  39% range bilaterally without significant progression from previous studies.  No role for intervention.  Continue antiplatelet therapy and recheck in 1 year.    Leotis Pain, MD  10/26/2019 11:46 AM    This note was created with Dragon medical transcription system.  Any errors from dictation are purely unintentional

## 2019-10-26 NOTE — Assessment & Plan Note (Signed)
Carotid disease in the 1 to 39% range bilaterally without significant progression from previous studies.  No role for intervention.  Continue antiplatelet therapy and recheck in 1 year.

## 2020-04-08 DIAGNOSIS — F330 Major depressive disorder, recurrent, mild: Secondary | ICD-10-CM | POA: Insufficient documentation

## 2020-05-22 ENCOUNTER — Telehealth (INDEPENDENT_AMBULATORY_CARE_PROVIDER_SITE_OTHER): Payer: Self-pay | Admitting: Gastroenterology

## 2020-05-22 DIAGNOSIS — D126 Benign neoplasm of colon, unspecified: Secondary | ICD-10-CM

## 2020-05-22 MED ORDER — NA SULFATE-K SULFATE-MG SULF 17.5-3.13-1.6 GM/177ML PO SOLN: 1.0000 | Freq: Once | ORAL | 0 refills | Status: AC

## 2020-05-22 NOTE — Progress Notes (Signed)
Gastroenterology Pre-Procedure Review  Request Date: Friday 06/27/20 Requesting Physician: Dr. Allen Norris  PATIENT REVIEW QUESTIONS: The patient responded to the following health history questions as indicated:    1. Are you having any GI issues? no 2. Do you have a personal history of Polyps? yes (01/22/19 Colonoscopy performed by Dr. Allen Norris) 3. Do you have a family history of Colon Cancer or Polyps? no 4. Diabetes Mellitus? yes (yes type 2) 5. Joint replacements in the past 12 months?no 6. Major health problems in the past 3 months?no 7. Any artificial heart valves, MVP, or defibrillator?no    MEDICATIONS & ALLERGIES:    Patient reports the following regarding taking any anticoagulation/antiplatelet therapy:   Plavix, Coumadin, Eliquis, Xarelto, Lovenox, Pradaxa, Brilinta, or Effient? no Aspirin? no  Patient confirms/reports the following medications:  Current Outpatient Medications  Medication Sig Dispense Refill  . acetaminophen (TYLENOL) 500 MG tablet Take 1,000 mg by mouth every 6 (six) hours as needed.    . Ascorbic Acid (VITAMIN C) 1000 MG tablet Take by mouth.    . Calcium Carb-Cholecalciferol (CALCIUM 600 + D PO) Take by mouth daily.    . Cholecalciferol 50 MCG (2000 UT) CAPS Take by mouth.    . diazepam (VALIUM) 5 MG tablet Take 1 tablet (5 mg total) by mouth daily as needed for anxiety. 10 tablet 0  . diltiazem (CARDIZEM CD) 120 MG 24 hr capsule Take 120 mg by mouth at bedtime.    . donepezil (ARICEPT) 5 MG tablet Take by mouth.    . ferrous gluconate (FERGON) 240 (27 FE) MG tablet Take 240 mg by mouth daily.     [name] loperamide (IMODIUM) 2 MG capsule Take 2 mg by mouth as needed for diarrhea or loose stools.    . magnesium oxide (MAG-OX) 400 MG tablet Take 400 mg by mouth daily.    . mirabegron ER (MYRBETRIQ) 25 MG TB24 tablet Take 1 tablet (25 mg total) by mouth daily. 90 tablet 3  . Multiple Vitamins-Minerals (PRESERVISION AREDS 2 PO) Take 1 tablet by mouth daily.    [name]  omeprazole (PRILOSEC) 20 MG capsule Take 2 tablets by mouth every morning (30 minutes before breakfast) and 1 tablet by mouth at bedtime. 270 capsule 3  . simvastatin (ZOCOR) 40 MG tablet Take 40 mg by mouth at bedtime.      . temazepam (RESTORIL) 15 MG capsule Take 1 capsule (15 mg total) by mouth at bedtime as needed for sleep. 10 capsule 0  . Na Sulfate-K Sulfate-Mg Sulf 17.5-3.13-1.6 GM/177ML SOLN Take 1 kit by mouth once for 1 dose. 354 mL 0  . PARoxetine (PAXIL) 20 MG tablet Take 20 mg by mouth daily.     No current facility-administered medications for this visit.    Patient confirms/reports the following allergies:  Allergies  Allergen Reactions  . Codeine     "felt drunk, crazy"  . Hydrocodone-Acetaminophen Other (See Comments)    Confusion/delirium  . Lidocaine     Heart racing, increased BP - from local at dentist (likely epi)  . Nsaids Other (See Comments)    States it makes her fell drunk    Orders Placed This Encounter  Procedures  . Procedural/ Surgical Case Request: COLONOSCOPY WITH PROPOFOL    Standing Status:   Standing    Number of Occurrences:   1    Order Specific Question:   Pre-op diagnosis    Answer:   adenomatous polyp    Order Specific Question:   CPT Code  Answer:   71566  . Ambulatory referral to Gastroenterology    Referral Priority:   Routine    Referral Type:   Consultation    Referral Reason:   Specialty Services Required    Referred to Provider:   Lucilla Lame, MD    Number of Visits Requested:   1    AUTHORIZATION INFORMATION Primary Insurance: 1D#: Group #:  Secondary Insurance: 1D#: Group #:  SCHEDULE INFORMATION: Date: Friday 06/27/20 Time: Location:[facility]

## 2020-05-23 ENCOUNTER — Telehealth: Payer: Self-pay

## 2020-05-23 NOTE — Telephone Encounter (Signed)
Returned patients call. Patient wanted to reschedule appointment but changed her mind about cancelling.

## 2020-06-16 IMAGING — CR DG FEMUR 2+V*L*
1 series · 5 of 5 positions shown · non-contrast
Comparison: None.

CLINICAL DATA: Left leg pain following a fall.

EXAM:
LEFT FEMUR 2 VIEWS

[Series 1: dg femur min 2 views left · 0.14mm/px · 5 of 5 slices shown]
[im 1/5]
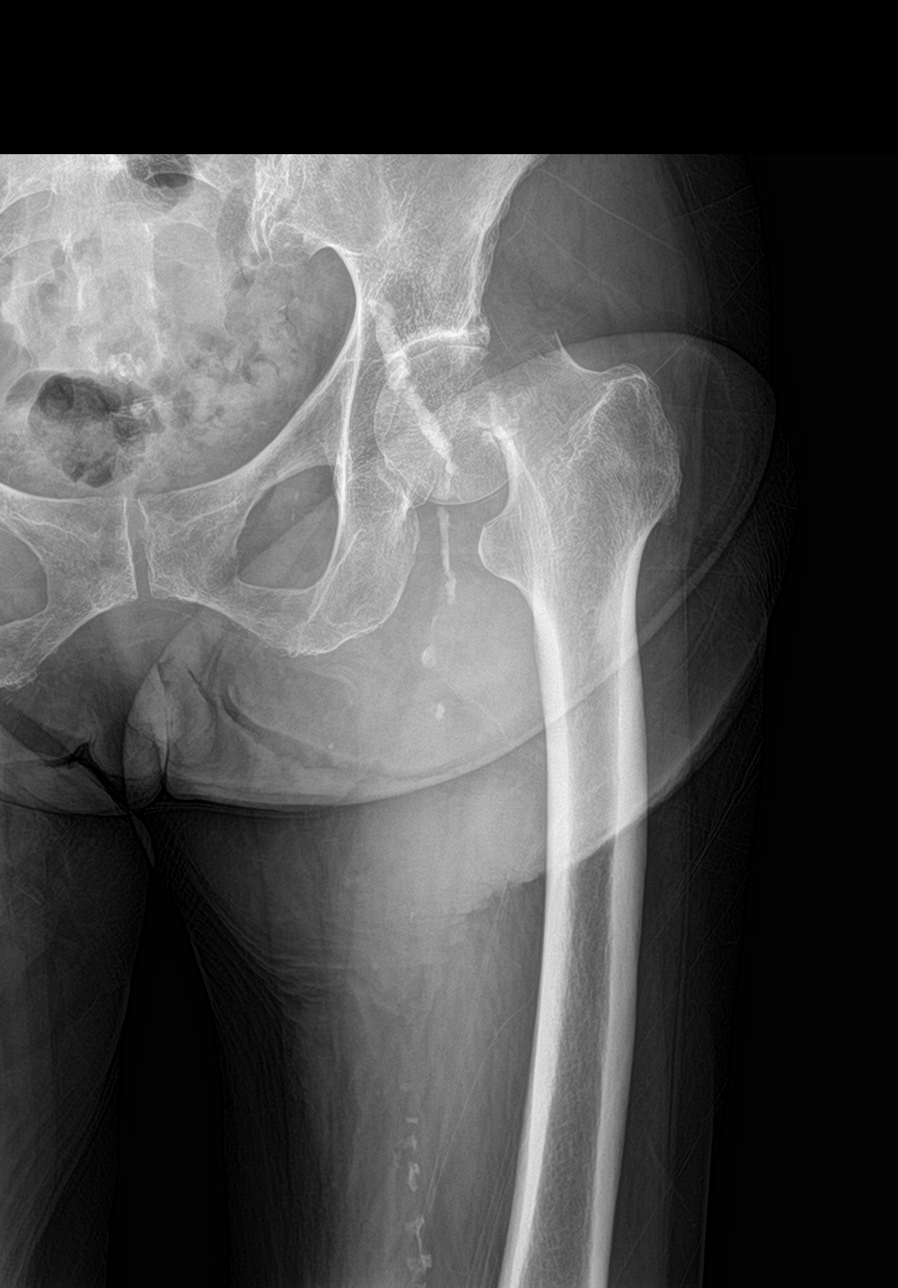
[im 2/5]
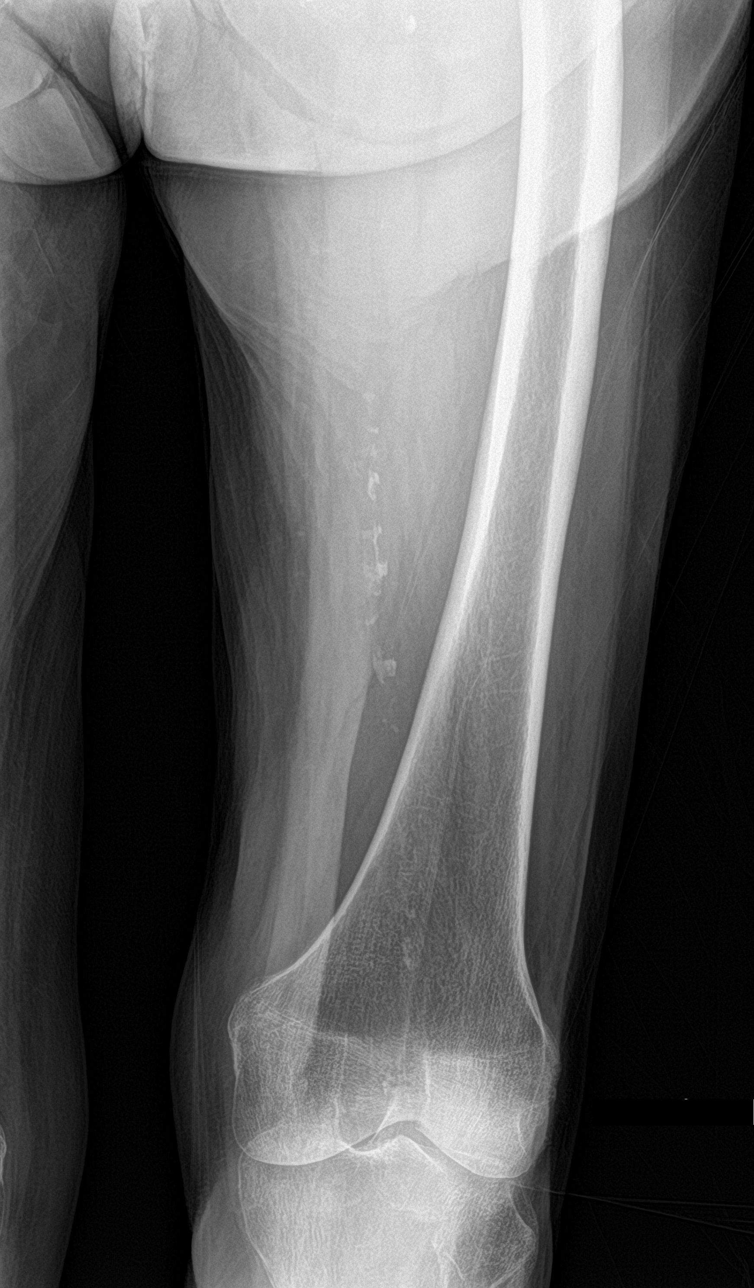
[im 3/5]
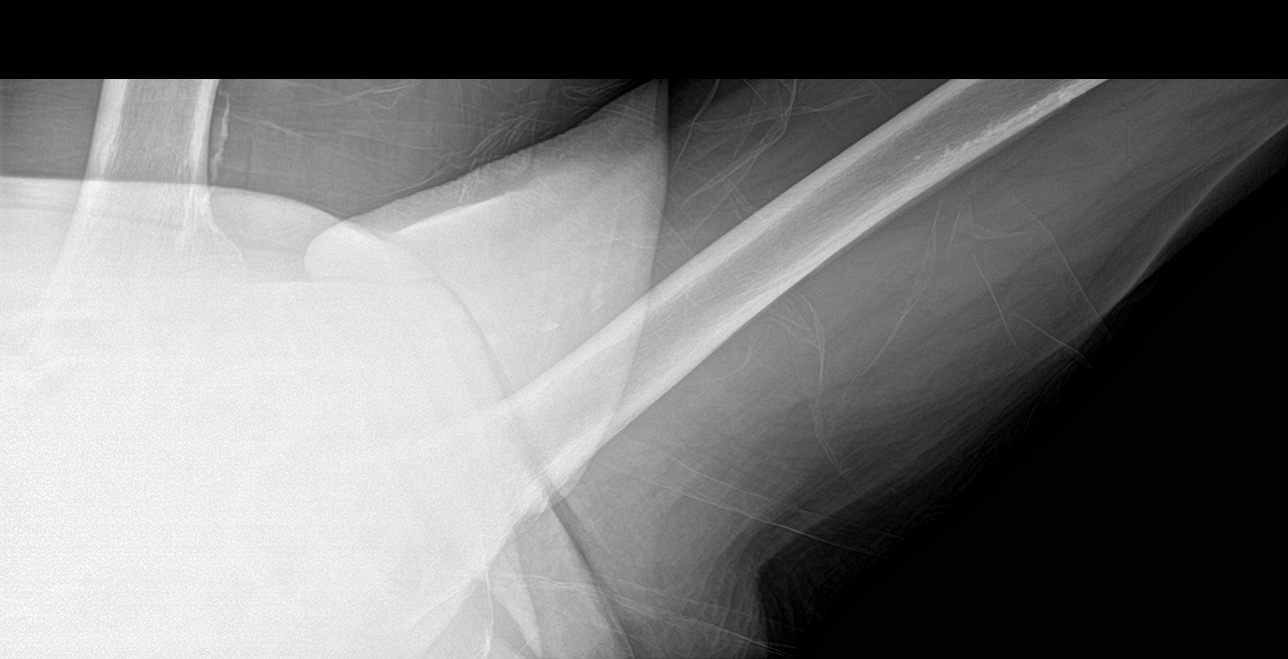
[im 4/5]
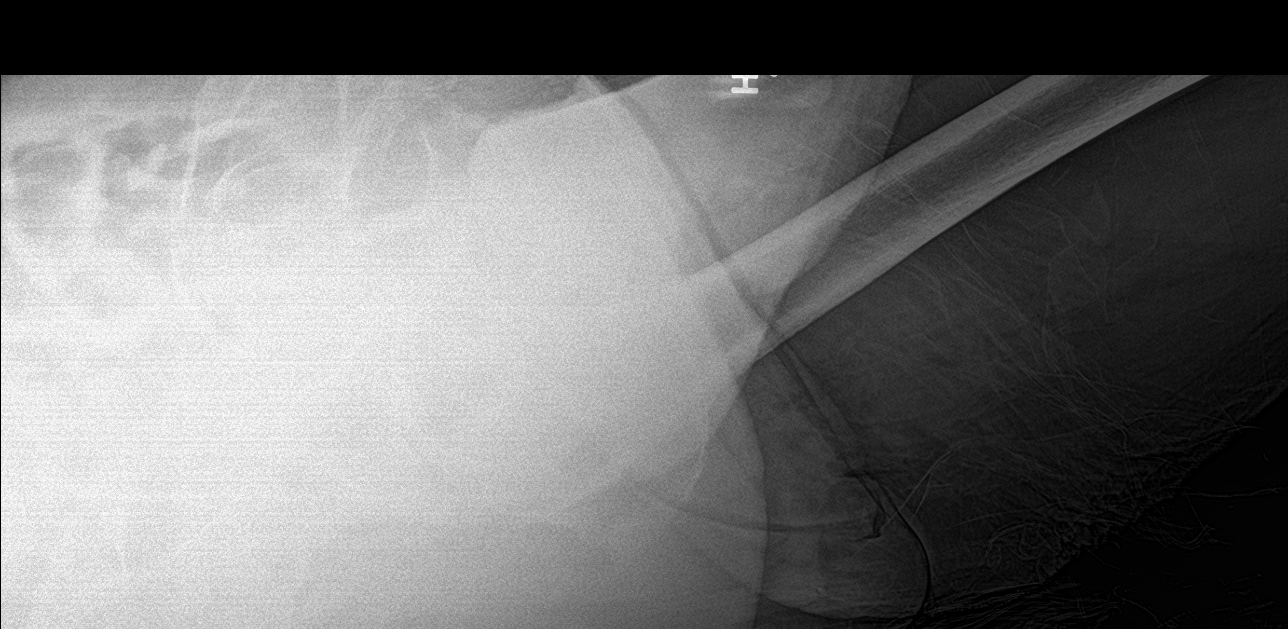
[im 5/5]
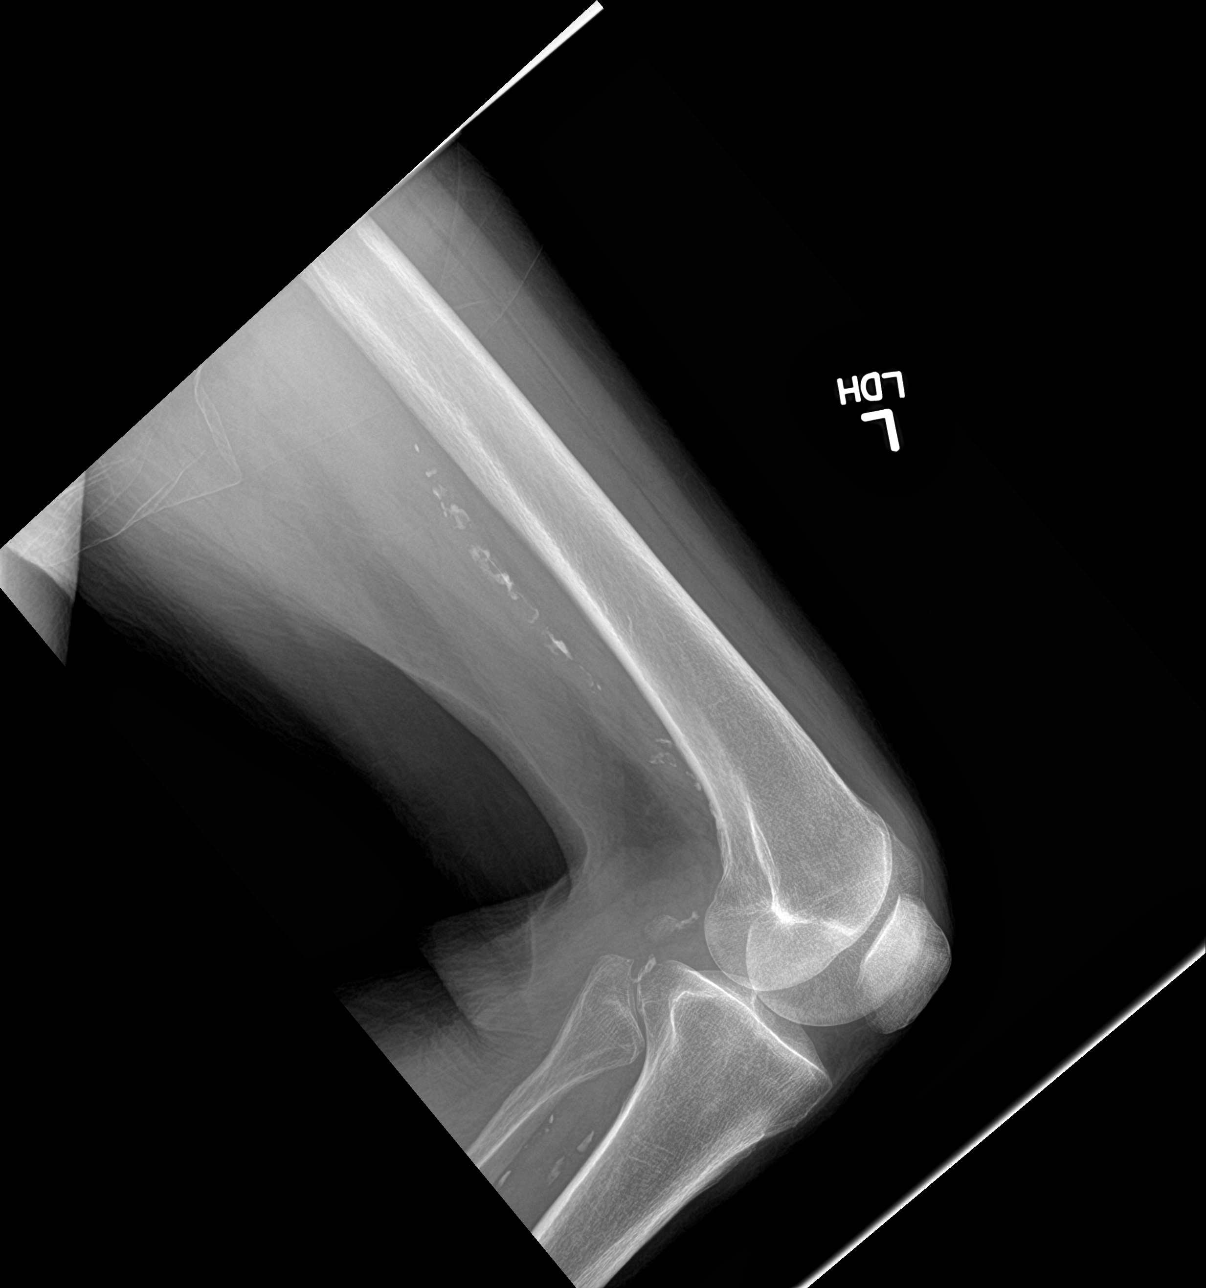

[5 of 5 positions shown; findings below may reference images not displayed]

FINDINGS: Left femoral neck fracture with varus angulation and proximal
displacement of the distal fragment. Atheromatous arterial
calcifications.
IMPRESSION: Left femoral neck fracture, as described above.

## 2020-06-18 IMAGING — DX DG FOOT COMPLETE 3+V*L*
3 series · 3 of 3 positions shown · non-contrast
Comparison: None.

CLINICAL DATA: Plantar arch foot pain.

EXAM:
LEFT FOOT - COMPLETE 3+ VIEW

[foot ap]
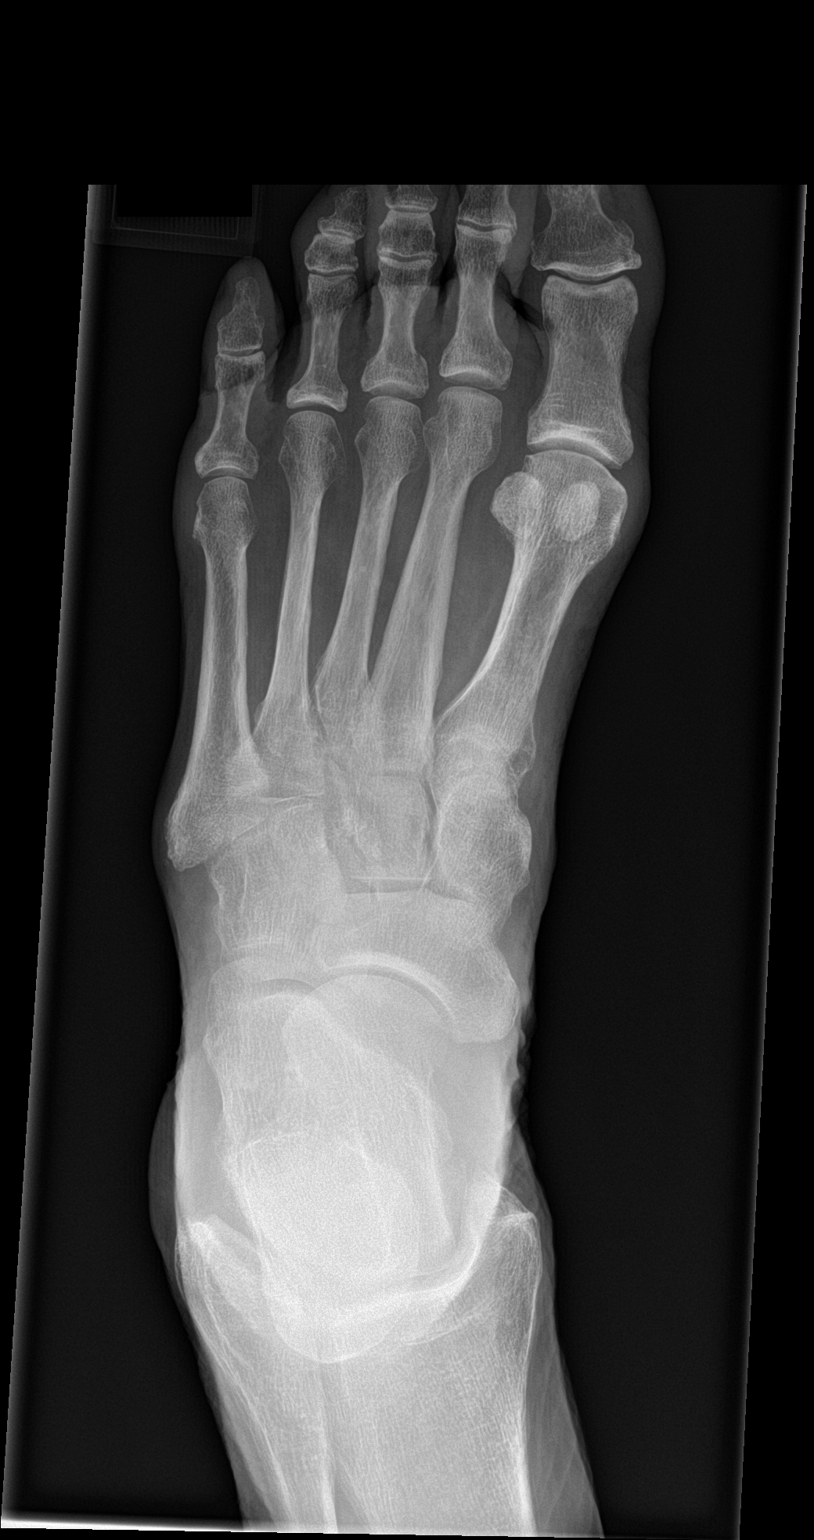

[foot obl]
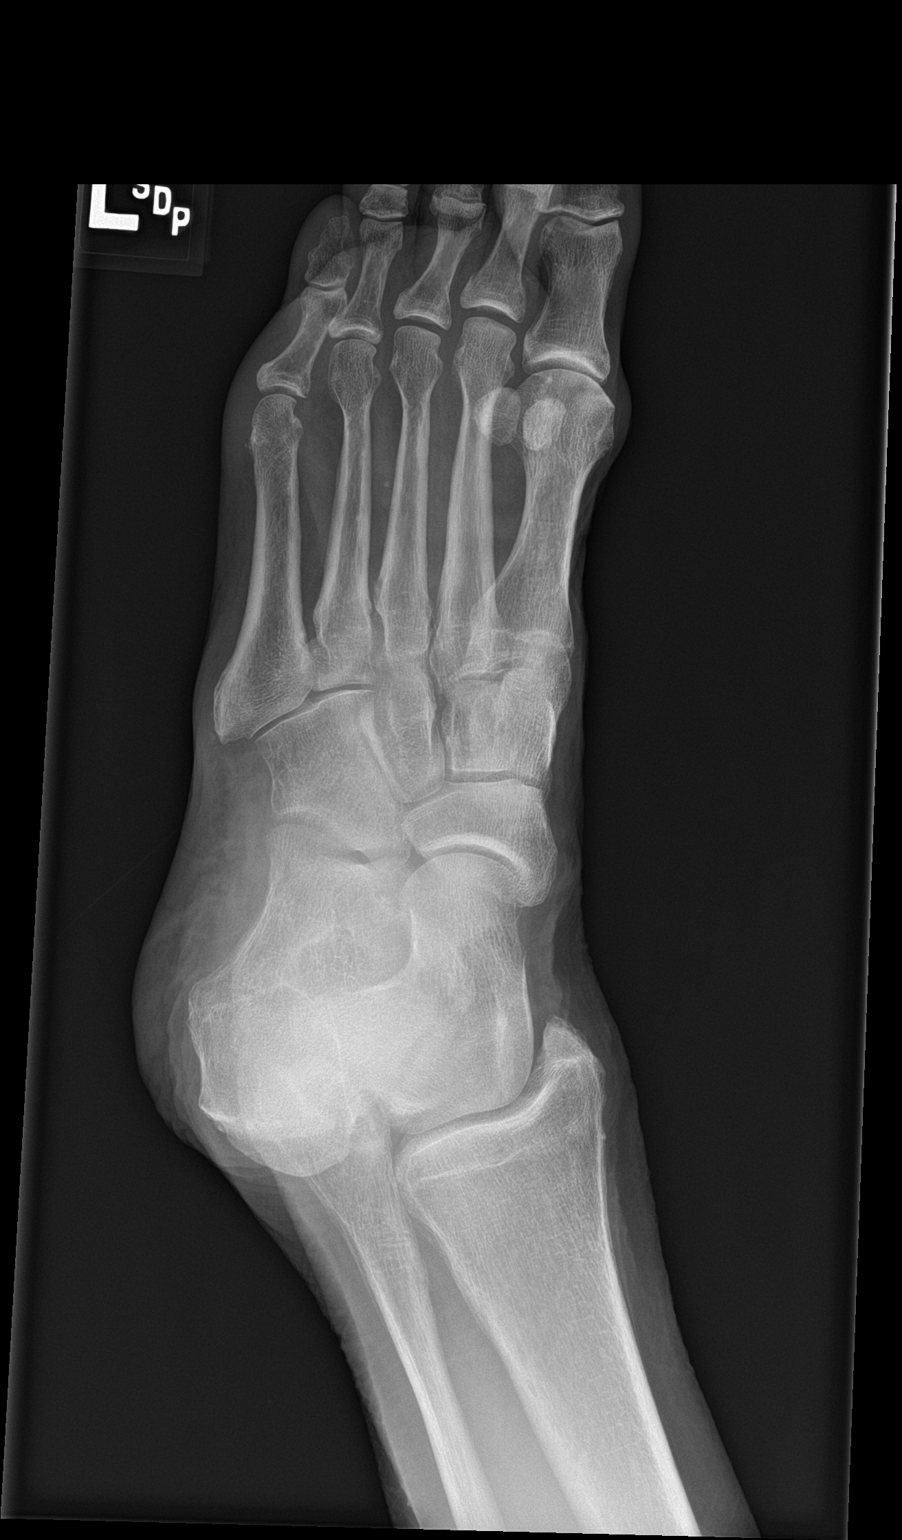

[foot lat]
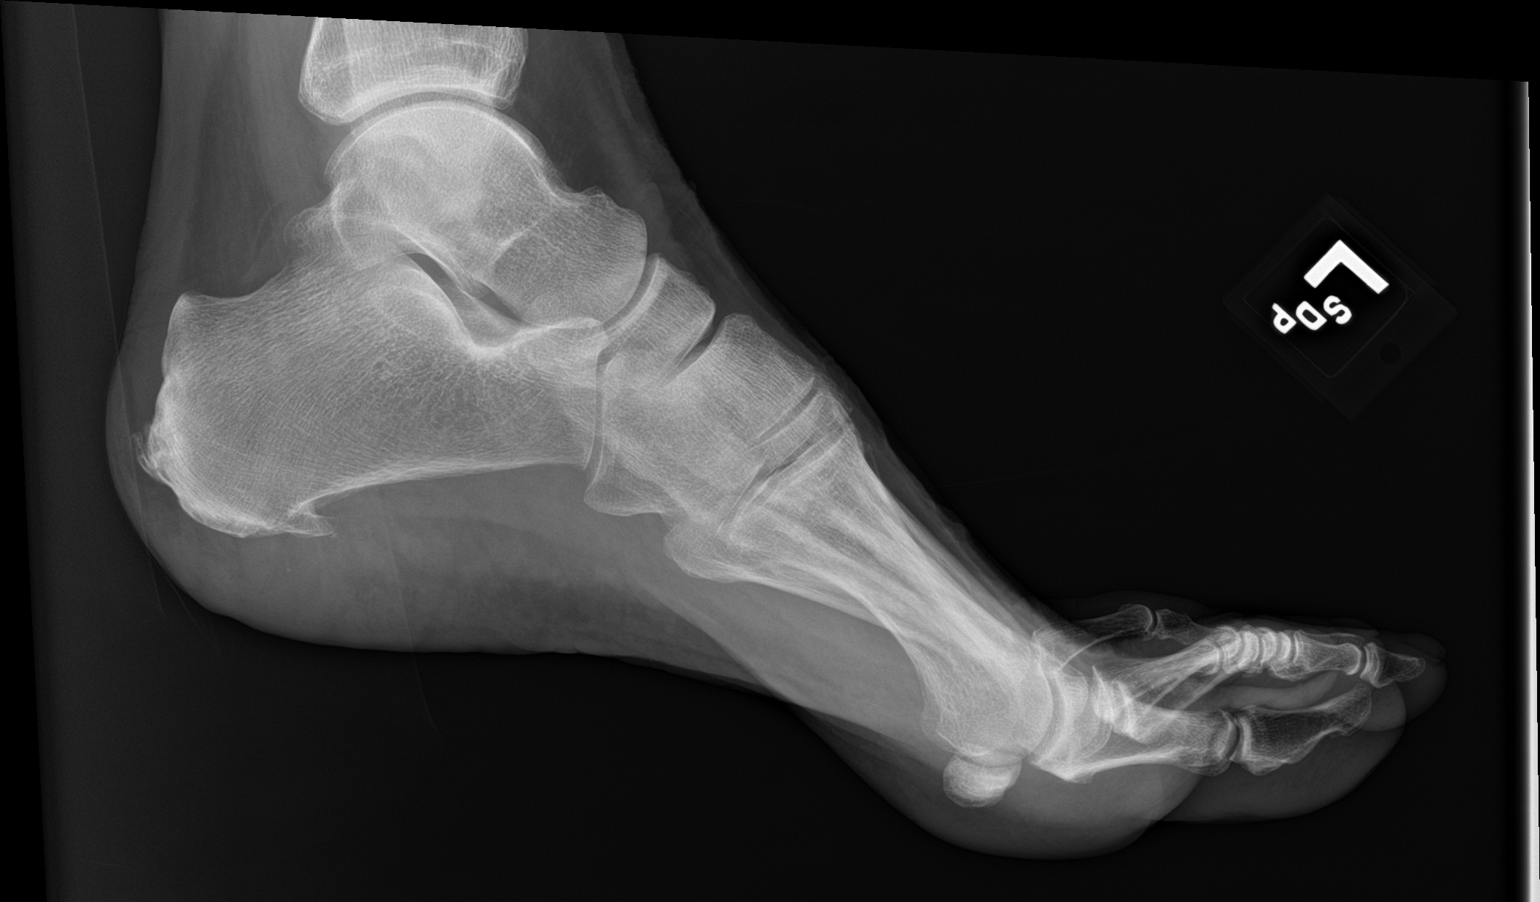

[3 of 3 positions shown; findings below may reference images not displayed]

FINDINGS: No fracture.  No bone lesion.

Joints are normally spaced and aligned.  No arthropathic changes.

Moderate-sized plantar and small dorsal calcaneal spurs.

Soft tissues are unremarkable.
IMPRESSION: 1. No fracture, bone lesion or joint abnormality.
2. Calcaneal spurs.

## 2020-06-19 ENCOUNTER — Encounter: Payer: Self-pay | Admitting: Gastroenterology

## 2020-06-25 ENCOUNTER — Other Ambulatory Visit
Admission: RE | Admit: 2020-06-25 | Discharge: 2020-06-25 | Disposition: A | Discharge status: Home / Self Care | Payer: Medicare PPO | Source: Ambulatory Visit | Attending: Gastroenterology | Admitting: Gastroenterology

## 2020-06-25 ENCOUNTER — Other Ambulatory Visit: Payer: Self-pay

## 2020-06-25 DIAGNOSIS — Z20822 Contact with and (suspected) exposure to covid-19: Secondary | ICD-10-CM | POA: Insufficient documentation

## 2020-06-25 DIAGNOSIS — Z01812 Encounter for preprocedural laboratory examination: Secondary | ICD-10-CM | POA: Diagnosis present

## 2020-06-25 LAB — SARS CORONAVIRUS 2 (TAT 6-24 HRS): SARS Coronavirus 2: NEGATIVE

## 2020-06-26 NOTE — Discharge Instructions (Signed)
General Anesthesia, Adult, Care After This sheet gives you information about how to care for yourself after your procedure. Your health care provider may also give you more specific instructions. If you have problems or questions, contact your health care provider. What can I expect after the procedure? After the procedure, the following side effects are common:  Pain or discomfort at the IV site.  Nausea.  Vomiting.  Sore throat.  Trouble concentrating.  Feeling cold or chills.  Weak or tired.  Sleepiness and fatigue.  Soreness and body aches. These side effects can affect parts of the body that were not involved in surgery. Follow these instructions at home:  For at least 24 hours after the procedure:  Have a responsible adult stay with you. It is important to have someone help care for you until you are awake and alert.  Rest as needed.  Do not: ? Participate in activities in which you could fall or become injured. ? Drive. ? Use heavy machinery. ? Drink alcohol. ? Take sleeping pills or medicines that cause drowsiness. ? Make important decisions or sign legal documents. ? Take care of children on your own. Eating and drinking  Follow any instructions from your health care provider about eating or drinking restrictions.  When you feel hungry, start by eating small amounts of foods that are soft and easy to digest (bland), such as toast. Gradually return to your regular diet.  Drink enough fluid to keep your urine pale yellow.  If you vomit, rehydrate by drinking water, juice, or clear broth. General instructions  If you have sleep apnea, surgery and certain medicines can increase your risk for breathing problems. Follow instructions from your health care provider about wearing your sleep device: ? Anytime you are sleeping, including during daytime naps. ? While taking prescription pain medicines, sleeping medicines, or medicines that make you drowsy.  Return to  your normal activities as told by your health care provider. Ask your health care provider what activities are safe for you.  Take over-the-counter and prescription medicines only as told by your health care provider.  If you smoke, do not smoke without supervision.  Keep all follow-up visits as told by your health care provider. This is important. Contact a health care provider if:  You have nausea or vomiting that does not get better with medicine.  You cannot eat or drink without vomiting.  You have pain that does not get better with medicine.  You are unable to pass urine.  You develop a skin rash.  You have a fever.  You have redness around your IV site that gets worse. Get help right away if:  You have difficulty breathing.  You have chest pain.  You have blood in your urine or stool, or you vomit blood. Summary  After the procedure, it is common to have a sore throat or nausea. It is also common to feel tired.  Have a responsible adult stay with you for the first 24 hours after general anesthesia. It is important to have someone help care for you until you are awake and alert.  When you feel hungry, start by eating small amounts of foods that are soft and easy to digest (bland), such as toast. Gradually return to your regular diet.  Drink enough fluid to keep your urine pale yellow.  Return to your normal activities as told by your health care provider. Ask your health care provider what activities are safe for you. This information is not   intended to replace advice given to you by your health care provider. Make sure you discuss any questions you have with your health care provider. Document Revised: 07/29/2017 Document Reviewed: 03/11/2017 Elsevier Patient Education  2020 [employer]  

## 2020-06-27 ENCOUNTER — Ambulatory Visit: Payer: Medicare PPO | Admitting: Anesthesiology

## 2020-06-27 ENCOUNTER — Ambulatory Visit
Admission: RE | Admit: 2020-06-27 | Discharge: 2020-06-27 | Disposition: A | Discharge status: Home / Self Care | Payer: Medicare PPO | Attending: Gastroenterology | Admitting: Gastroenterology

## 2020-06-27 ENCOUNTER — Other Ambulatory Visit: Payer: Self-pay

## 2020-06-27 ENCOUNTER — Encounter
Admission: RE | Disposition: A | Discharge status: Home / Self Care | Payer: Self-pay | Source: Home / Self Care | Attending: Gastroenterology

## 2020-06-27 ENCOUNTER — Encounter: Payer: Self-pay | Admitting: Gastroenterology

## 2020-06-27 DIAGNOSIS — Z860100 Personal history of colon polyps, unspecified: Secondary | ICD-10-CM

## 2020-06-27 DIAGNOSIS — Z1211 Encounter for screening for malignant neoplasm of colon: Secondary | ICD-10-CM | POA: Insufficient documentation

## 2020-06-27 DIAGNOSIS — K621 Rectal polyp: Secondary | ICD-10-CM | POA: Insufficient documentation

## 2020-06-27 DIAGNOSIS — D123 Benign neoplasm of transverse colon: Secondary | ICD-10-CM | POA: Diagnosis not present

## 2020-06-27 DIAGNOSIS — Z79899 Other long term (current) drug therapy: Secondary | ICD-10-CM | POA: Diagnosis not present

## 2020-06-27 DIAGNOSIS — D124 Benign neoplasm of descending colon: Secondary | ICD-10-CM | POA: Diagnosis not present

## 2020-06-27 DIAGNOSIS — K635 Polyp of colon: Secondary | ICD-10-CM | POA: Diagnosis not present

## 2020-06-27 DIAGNOSIS — Z8719 Personal history of other diseases of the digestive system: Secondary | ICD-10-CM | POA: Diagnosis not present

## 2020-06-27 DIAGNOSIS — D126 Benign neoplasm of colon, unspecified: Secondary | ICD-10-CM

## 2020-06-27 DIAGNOSIS — K5730 Diverticulosis of large intestine without perforation or abscess without bleeding: Secondary | ICD-10-CM | POA: Insufficient documentation

## 2020-06-27 HISTORY — PX: POLYPECTOMY: SHX5525

## 2020-06-27 HISTORY — PX: COLONOSCOPY WITH PROPOFOL: SHX5780

## 2020-06-27 HISTORY — DX: Unspecified osteoarthritis, unspecified site: M19.90

## 2020-06-27 SURGERY — COLONOSCOPY WITH PROPOFOL
Anesthesia: General | Site: Rectum

## 2020-06-27 MED ORDER — PROPOFOL 10 MG/ML IV BOLUS
INTRAVENOUS | Status: DC | PRN
  Administered 2020-06-27 (×2): 20 mg via INTRAVENOUS
  Administered 2020-06-27: 100 mg via INTRAVENOUS
  Administered 2020-06-27 (×3): 20 mg via INTRAVENOUS
  Administered 2020-06-27: 40 mg via INTRAVENOUS

## 2020-06-27 MED ORDER — SODIUM CHLORIDE 0.9 % IV SOLN: INTRAVENOUS | Status: DC

## 2020-06-27 MED ORDER — LACTATED RINGERS IV SOLN: INTRAVENOUS | Status: DC

## 2020-06-27 MED ORDER — LIDOCAINE HCL (CARDIAC) PF 100 MG/5ML IV SOSY
PREFILLED_SYRINGE | INTRAVENOUS | Status: DC | PRN
  Administered 2020-06-27: 30 mg via INTRAVENOUS

## 2020-06-27 MED ORDER — STERILE WATER FOR IRRIGATION IR SOLN: Status: DC | PRN

## 2020-06-27 SURGICAL SUPPLY — 8 items
GOWN CVR UNV OPN BCK APRN NK (MISCELLANEOUS) ×2 IMPLANT
GOWN ISOL THUMB LOOP REG UNIV (MISCELLANEOUS) ×2
KIT PRC NS LF DISP ENDO (KITS) ×1 IMPLANT
KIT PROCEDURE OLYMPUS (KITS) ×1
MANIFOLD NEPTUNE II (INSTRUMENTS) ×2 IMPLANT
SNARE COLD EXACTO (MISCELLANEOUS) ×2 IMPLANT
TRAP ETRAP POLY (MISCELLANEOUS) ×2 IMPLANT
WATER STERILE IRR 250ML POUR (IV SOLUTION) ×2 IMPLANT

## 2020-06-27 NOTE — Anesthesia Preprocedure Evaluation (Signed)
Anesthesia Evaluation  Patient identified by MRN, date of birth, ID band Patient awake    Reviewed: Allergy & Precautions, H&P , NPO status , Patient's Chart, lab work & pertinent test results  History of Anesthesia Complications Negative for: history of anesthetic complications  Airway Mallampati: II  TM Distance: >3 FB Neck ROM: full    Dental no notable dental hx.    Pulmonary COPD, Current SmokerPatient did not abstain from smoking.,    Pulmonary exam normal breath sounds clear to auscultation       Cardiovascular Exercise Tolerance: Good hypertension, + Peripheral Vascular Disease (Carotid stenosis, mild)  Normal cardiovascular exam Rhythm:Regular Rate:Normal  HLD   Neuro/Psych Anxiety Memory loss    GI/Hepatic Neg liver ROS, GERD  Controlled,IBS   Endo/Other  diabetes (diet ctrl)  Renal/GU negative Renal ROS Bladder dysfunction      Musculoskeletal  (+) Arthritis ,   Abdominal   Peds  Hematology negative hematology ROS (+)   Anesthesia Other Findings Covid: NEG.  Reproductive/Obstetrics negative OB ROS                             Anesthesia Physical  Anesthesia Plan  ASA: III  Anesthesia Plan: General   Post-op Pain Management:    Induction: Intravenous  PONV Risk Score and Plan: 3 and TIVA, Propofol infusion and Ondansetron  Airway Management Planned: Nasal Cannula and Natural Airway  Additional Equipment:   Intra-op Plan:   Post-operative Plan:   Informed Consent: I have reviewed the patients History and Physical, chart, labs and discussed the procedure including the risks, benefits and alternatives for the proposed anesthesia with the patient or authorized representative who has indicated his/her understanding and acceptance.       Plan Discussed with: CRNA  Anesthesia Plan Comments:         Anesthesia Quick Evaluation

## 2020-06-27 NOTE — Transfer of Care (Signed)
Immediate Anesthesia Transfer of Care Note  Patient: Yolanda Jimenez  Procedure(s) Performed: COLONOSCOPY WITH BIOPSY (N/A Rectum) POLYPECTOMY (N/A Rectum)  Patient Location: PACU  Anesthesia Type: General  Level of Consciousness: awake, alert  and patient cooperative  Airway and Oxygen Therapy: Patient Spontanous Breathing and Patient connected to supplemental oxygen  Post-op Assessment: Post-op Vital signs reviewed, Patient's Cardiovascular Status Stable, Respiratory Function Stable, Patent Airway and No signs of Nausea or vomiting  Post-op Vital Signs: Reviewed and stable  Complications: No complications documented.

## 2020-06-27 NOTE — H&P (Signed)
Lucilla Lame, MD [address] [place], [place] [address] Phone:[phone] Fax : [phone]  Primary Care Physician:  Rusty Aus, MD Primary Gastroenterologist:  Dr. Allen Norris  Pre-Procedure History & Physical: HPI:  Yolanda Jimenez is a 74 y.o. female is here for an colonoscopy.   Past Medical History:  Diagnosis Date  . Arthritis    back  . AVM (arteriovenous malformation)   . Barrett esophagus   . C. difficile diarrhea   . Carotid stenosis   . Diabetes mellitus without complication (HCC)    borderline  . Diverticulosis   . GERD (gastroesophageal reflux disease)   . Heart murmur   . Hx of adenomatous colonic polyps   . Hyperlipidemia   . IBS (irritable bowel syndrome)   . Insomnia   . Iron deficiency anemia   . Kidney cysts    hx UTIs  . Microscopic hematuria   . Osteoporosis    mild  . PVD (peripheral vascular disease) ([place])   . Wears dentures    partial lower    Past Surgical History:  Procedure Laterality Date  . BUNIONECTOMY     right foot  . CATARACT EXTRACTION     both eyes  . CERVICAL CONE BIOPSY    . CHOLECYSTECTOMY    . COLONOSCOPY    . COLONOSCOPY WITH PROPOFOL N/A 01/22/2019   Procedure: COLONOSCOPY WITH BIOPSY;  Surgeon: Lucilla Lame, MD;  Location: [facility];  Service: Endoscopy;  Laterality: N/A;  diabetic - diet controlled  . ESOPHAGOGASTRODUODENOSCOPY (EGD) WITH PROPOFOL N/A 08/29/2017   Procedure: ESOPHAGOGASTRODUODENOSCOPY (EGD) WITH PROPOFOL;  Surgeon: Lucilla Lame, MD;  Location: [facility];  Service: Endoscopy;  Laterality: N/A;  . FOOT SURGERY     right 2nd toe  . HIP ARTHROPLASTY Left 05/16/2018   Procedure: ARTHROPLASTY BIPOLAR HIP (HEMIARTHROPLASTY);  Surgeon: Corky Mull, MD;  Location: [facility];  Service: Orthopedics;  Laterality: Left;  . POLYPECTOMY N/A 01/22/2019   Procedure: POLYPECTOMY;  Surgeon: Lucilla Lame, MD;  Location: [facility];  Service: Endoscopy;  Laterality: N/A;  2  Clips placed at polyp removal site in ascending colon  . TUBAL LIGATION    . UPPER GASTROINTESTINAL ENDOSCOPY      Prior to Admission medications   Medication Sig Start Date End Date Taking? Authorizing Provider  acetaminophen (TYLENOL) 500 MG tablet Take 1,000 mg by mouth every 6 (six) hours as needed. 05/19/18  Yes [provider]  Ascorbic Acid (VITAMIN C) 1000 MG tablet Take by mouth.   Yes [provider]  Calcium Carb-Cholecalciferol (CALCIUM 600 + D PO) Take by mouth daily.   Yes [provider]  Cholecalciferol 50 MCG (2000 UT) CAPS Take by mouth.   Yes [provider]  diazepam (VALIUM) 5 MG tablet Take 1 tablet (5 mg total) by mouth daily as needed for anxiety. 05/24/18  Yes Gerlene Fee, NP  ferrous gluconate (FERGON) 240 (27 FE) MG tablet Take 240 mg by mouth daily.  09/09/17  Yes [provider]  loperamide (IMODIUM) 2 MG capsule Take 2 mg by mouth as needed for diarrhea or loose stools.   Yes [provider]  magnesium oxide (MAG-OX) 400 MG tablet Take 400 mg by mouth daily.   Yes [provider]  Multiple Vitamins-Minerals (PRESERVISION AREDS 2 PO) Take 1 tablet by mouth daily.   Yes [provider]  omeprazole (PRILOSEC) 20 MG capsule Take 2 tablets by mouth every morning (30 minutes  before breakfast) and 1 tablet by mouth at bedtime. 07/03/13  Yes Lafayette Dragon, MD  simvastatin (ZOCOR) 40 MG tablet Take 40 mg by mouth at bedtime.     Yes [provider]  temazepam (RESTORIL) 15 MG capsule Take 1 capsule (15 mg total) by mouth at bedtime as needed for sleep. 05/24/18  Yes Gerlene Fee, NP  diltiazem (CARDIZEM CD) 120 MG 24 hr capsule Take 120 mg by mouth at bedtime. Patient not taking: Reported on 06/19/2020 09/14/19   [provider]  donepezil (ARICEPT) 5 MG tablet Take by mouth. Patient not taking: Reported on 06/19/2020 10/04/19 10/03/20  [provider]  mirabegron ER  (MYRBETRIQ) 25 MG TB24 tablet Take 1 tablet (25 mg total) by mouth daily. Patient not taking: Reported on 06/19/2020 10/09/19   Zara Council A, PA-C  PARoxetine (PAXIL) 20 MG tablet Take 20 mg by mouth daily. Patient not taking: Reported on 06/19/2020 05/08/20   [provider]    Allergies as of 05/22/2020 - Review Complete 05/22/2020  Allergen Reaction Noted  . Codeine  08/23/2008  . Hydrocodone-acetaminophen Other (See Comments) 04/25/2015  . Lidocaine  09/14/2018  . Nsaids Other (See Comments) 05/15/2018    Family History  Problem Relation Age of Onset  . Diabetes Mother   . Heart disease Mother   . Hypertension Mother   . Diabetes Son   . Diabetes Sister   . Inflammatory bowel disease Father        diverticulitis  . Multiple myeloma Father   . Hypertension Son   . Diabetes Maternal Grandmother   . Colon cancer Neg Hx   . Esophageal cancer Neg Hx   . Rectal cancer Neg Hx   . Stomach cancer Neg Hx   . Breast cancer Neg Hx     Social History   Socioeconomic History  . Marital status: Single    Spouse name: Not on file  . Number of children: 2  . Years of education: Not on file  . Highest education level: Not on file  Occupational History  . Occupation: Retired  Tobacco Use  . Smoking status: Current Every Day Smoker    Packs/day: 0.50    Years: 55.00    Pack years: 27.50    Types: Cigarettes  . Smokeless tobacco: Never Used  . Tobacco comment: she has patches to start just not started yet. smoked since teenager.  Vaping Use  . Vaping Use: Never used  Substance and Sexual Activity  . Alcohol use: Yes    Alcohol/week: 2.0 standard drinks    Types: 2 Glasses of wine per week    Comment: socially  . Drug use: No  . Sexual activity: Not on file  Other Topics Concern  . Not on file  Social History Narrative   Independent at baseline.  Lives at home with her husband   Social Determinants of Health   Financial Resource Strain:   . Difficulty of  Paying Living Expenses: Not on file  Food Insecurity:   . Worried About Charity fundraiser in the Last Year: Not on file  . Ran Out of Food in the Last Year: Not on file  Transportation Needs:   . Lack of Transportation (Medical): Not on file  . Lack of Transportation (Non-Medical): Not on file  Physical Activity:   . Days of Exercise per Week: Not on file  . Minutes of Exercise per Session: Not on file  Stress:   . Feeling  of Stress : Not on file  Social Connections:   . Frequency of Communication with Friends and Family: Not on file  . Frequency of Social Gatherings with Friends and Family: Not on file  . Attends Religious Services: Not on file  . Active Member of Clubs or Organizations: Not on file  . Attends Archivist Meetings: Not on file  . Marital Status: Not on file  Intimate Partner Violence:   . Fear of Current or Ex-Partner: Not on file  . Emotionally Abused: Not on file  . Physically Abused: Not on file  . Sexually Abused: Not on file    Review of Systems: See HPI, otherwise negative ROS  Physical Exam: BP 138/84   Pulse 76   Temp 97.6 F (36.4 C) (Temporal)   Ht 5' 1"  (1.549 m)   Wt 43.1 kg   SpO2 100%   BMI 17.95 kg/m  General:   Alert,  pleasant and cooperative in NAD Head:  Normocephalic and atraumatic. Neck:  Supple; no masses or thyromegaly. Lungs:  Clear throughout to auscultation.    Heart:  Regular rate and rhythm. Abdomen:  Soft, nontender and nondistended. Normal bowel sounds, without guarding, and without rebound.   Neurologic:  Alert and  oriented x4;  grossly normal neurologically.  Impression/Plan: KORALEE WEDEKING is here for an colonoscopy to be performed for a history of adenomatous polyps on that was large in 2020  Risks, benefits, limitations, and alternatives regarding  colonoscopy have been reviewed with the patient.  Questions have been answered.  All parties agreeable.   Lucilla Lame, MD  06/27/2020, 8:19 AM

## 2020-06-27 NOTE — Anesthesia Postprocedure Evaluation (Signed)
Anesthesia Post Note  Patient: Yolanda Jimenez  Procedure(s) Performed: COLONOSCOPY WITH BIOPSY (N/A Rectum) POLYPECTOMY (N/A Rectum)     Patient location during evaluation: PACU Anesthesia Type: General Level of consciousness: awake and alert Pain management: pain level controlled Vital Signs Assessment: post-procedure vital signs reviewed and stable Respiratory status: spontaneous breathing, nonlabored ventilation, respiratory function stable and patient connected to nasal cannula oxygen Cardiovascular status: blood pressure returned to baseline and stable Postop Assessment: no apparent nausea or vomiting Anesthetic complications: no   No complications documented.  Fidel Levy

## 2020-06-27 NOTE — Op Note (Signed)
[facility] Gastroenterology Patient Name: Yolanda Jimenez Procedure Date: 06/27/2020 8:17 AM MRN: [id number] Account #: [id number] Date of Birth: 07-Aug-1946 Admit Type: Outpatient Age: 74 Room: [facility] OR ROOM 01 Gender: Female Note Status: Finalized Procedure:             Colonoscopy Indications:           High risk colon cancer surveillance: Personal history                         of colonic polyps Providers:             Lucilla Lame MD, MD Referring MD:          Rusty Aus, MD (Referring MD) Medicines:             Propofol per Anesthesia Complications:         No immediate complications. Procedure:             Pre-Anesthesia Assessment:                        - Prior to the procedure, a History and Physical was                         performed, and patient medications and allergies were                         reviewed. The patient's tolerance of previous                         anesthesia was also reviewed. The risks and benefits                         of the procedure and the sedation options and risks                         were discussed with the patient. All questions were                         answered, and informed consent was obtained. Prior                         Anticoagulants: The patient has taken no previous                         anticoagulant or antiplatelet agents. ASA Grade                         Assessment: II - A patient with mild systemic disease.                         After reviewing the risks and benefits, the patient                         was deemed in satisfactory condition to undergo the                         procedure.  After obtaining informed consent, the colonoscope was                         passed under direct vision. Throughout the procedure,                         the patient's blood pressure, pulse, and oxygen                         saturations were monitored continuously. The was                          introduced through the anus and advanced to the the                         cecum, identified by appendiceal orifice and ileocecal                         valve. The colonoscopy was performed without                         difficulty. The patient tolerated the procedure well.                         The quality of the bowel preparation was excellent. Findings:      The perianal and digital rectal examinations were normal.      A 4 mm polyp was found in the rectum. The polyp was sessile. The polyp       was removed with a cold snare. Resection and retrieval were complete.      Two sessile polyps were found in the transverse colon. The polyps were 2       to 3 mm in size. These polyps were removed with a cold snare. Resection       and retrieval were complete.      Three sessile polyps were found in the descending colon. The polyps were       3 to 4 mm in size. These polyps were removed with a cold snare.       Resection and retrieval were complete.      Multiple small-mouthed diverticula were found in the sigmoid colon. Impression:            - One 4 mm polyp in the rectum, removed with a cold                         snare. Resected and retrieved.                        - Two 2 to 3 mm polyps in the transverse colon,                         removed with a cold snare. Resected and retrieved.                        - Three 3 to 4 mm polyps in the descending colon,                         removed with a cold snare. Resected  and retrieved.                        - Diverticulosis in the sigmoid colon. Recommendation:        - Discharge patient to home.                        - Resume previous diet.                        - Continue present medications.                        - Await pathology results. Procedure Code(s):     --- Professional ---                        [phone], Colonoscopy, flexible; with removal of                         tumor(s), polyp(s), or other lesion(s) by  snare                         technique Diagnosis Code(s):     --- Professional ---                        Z86.010, Personal history of colonic polyps                        K63.5, Polyp of colon CPT copyright 2019 American Medical Association. All rights reserved. The codes documented in this report are preliminary and upon coder review may  be revised to meet current compliance requirements. Lucilla Lame MD, MD 06/27/2020 8:50:36 AM This report has been signed electronically. Number of Addenda: 0 Note Initiated On: 06/27/2020 8:17 AM Scope Withdrawal Time: 0 hours 12 minutes 15 seconds  Total Procedure Duration: 0 hours 17 minutes 5 seconds  Estimated Blood Loss:  Estimated blood loss: none. Estimated blood loss: none.      [facility]

## 2020-06-27 NOTE — Anesthesia Procedure Notes (Signed)
Date/Time: 06/27/2020 8:27 AM Performed by: Cameron Ali, CRNA Pre-anesthesia Checklist: Patient identified, Emergency Drugs available, Suction available, Timeout performed and Patient being monitored Patient Re-evaluated:Patient Re-evaluated prior to induction Oxygen Delivery Method: Nasal cannula Placement Confirmation: positive ETCO2

## 2020-06-30 ENCOUNTER — Encounter: Payer: Self-pay | Admitting: Gastroenterology

## 2020-06-30 LAB — SURGICAL PATHOLOGY

## 2020-09-17 ENCOUNTER — Other Ambulatory Visit: Payer: Self-pay

## 2020-09-17 ENCOUNTER — Encounter: Payer: Self-pay | Admitting: Urology

## 2020-09-17 ENCOUNTER — Ambulatory Visit (INDEPENDENT_AMBULATORY_CARE_PROVIDER_SITE_OTHER): Payer: Medicare PPO | Admitting: Urology

## 2020-09-17 VITALS — BP 135/77 | HR 79

## 2020-09-17 DIAGNOSIS — R3129 Other microscopic hematuria: Secondary | ICD-10-CM

## 2020-09-17 DIAGNOSIS — Z87448 Personal history of other diseases of urinary system: Secondary | ICD-10-CM

## 2020-09-17 LAB — URINALYSIS, COMPLETE
Bilirubin, UA: NEGATIVE
Glucose, UA: NEGATIVE
Ketones, UA: NEGATIVE
Leukocytes,UA: NEGATIVE
Nitrite, UA: NEGATIVE
Specific Gravity, UA: >1.03 — ABNORMAL HIGH (ref 1.005–1.030)
Urobilinogen, Ur: 0.2 mg/dL (ref 0.2–1.0)
pH, UA: 5 (ref 5.0–7.5)

## 2020-09-17 LAB — MICROSCOPIC EXAMINATION: Bacteria, UA: NONE SEEN

## 2020-09-17 NOTE — Progress Notes (Signed)
   09/17/20  CC:  Chief Complaint  Patient presents with  . Cysto    HPI: 75 year old female with persistent microscopic hematuria who presents today for cystoscopic evaluation.  Please see notes from Zara Council on 10/2019.    Last cystoscopy 09/2018 unremarkable.  She had upper tract imaging in the form of CT urogram in 2019.  She does have persistent microscopic blood in her urine today.  She reports that she tried the Myrbetriq after last visit and this was not particularly helpful.  She reports that she does not have much urgency frequency at this point in time.  She does feel like her urinary stream is slightly slower than he used to be but does feel like she ultimately empties.  Blood pressure 135/77, pulse 79. NED. A&Ox3.   No respiratory distress   Abd soft, NT, ND Normal external genitalia with patent urethral meatus  Cystoscopy Procedure Note  Patient identification was confirmed, informed consent was obtained, and patient was prepped using Betadine solution.  Lidocaine jelly was administered per urethral meatus.    Procedure: - Flexible cystoscope introduced, without any difficulty.   - Thorough search of the bladder revealed:    normal urethral meatus    normal urothelium    no stones    no ulcers     no tumors    no urethral polyps    Mild trabeculation  - Ureteral orifices were normal in position and appearance.  There is evidence of subtle trigonitis.  Post-Procedure: - Patient tolerated the procedure well  Assessment/ Plan:  1. Microscopic hematuria Cystoscopy unremarkable again today  No recent upper tract imaging, personal history of enlarging renal cyst.  We will recheck a renal ultrasound, call her with results if there is anything concerning.  As per previous recommendations, she has persistent microscopic hematuria.  Recommend repeat evaluation every 2 to 3 years with persist.  2. History of hematuria As above - Urinalysis, Complete -  Ultrasound renal complete; Future  Follow-up in 1 year for urinalysis, symptom recheck.  If microscopic hematuria persists, repeat evaluation and 2024    Hollice Espy, MD

## 2020-10-06 ENCOUNTER — Other Ambulatory Visit: Payer: Self-pay

## 2020-10-06 ENCOUNTER — Other Ambulatory Visit
Admission: RE | Admit: 2020-10-06 | Discharge: 2020-10-06 | Disposition: A | Discharge status: Home / Self Care | Payer: Medicare PPO | Source: Ambulatory Visit | Attending: Orthopedic Surgery | Admitting: Orthopedic Surgery

## 2020-10-06 ENCOUNTER — Other Ambulatory Visit: Payer: Self-pay | Admitting: Orthopedic Surgery

## 2020-10-06 DIAGNOSIS — Z01812 Encounter for preprocedural laboratory examination: Secondary | ICD-10-CM | POA: Insufficient documentation

## 2020-10-06 DIAGNOSIS — Z20822 Contact with and (suspected) exposure to covid-19: Secondary | ICD-10-CM | POA: Insufficient documentation

## 2020-10-06 MED ORDER — CHLORHEXIDINE GLUCONATE 0.12 % MT SOLN
15.0000 mL | Freq: Once | OROMUCOSAL | Status: AC
  Administered 2020-10-07: 15 mL via OROMUCOSAL

## 2020-10-06 MED ORDER — CEFAZOLIN SODIUM-DEXTROSE 2-4 GM/100ML-% IV SOLN
2.0000 g | INTRAVENOUS | Status: AC
  Administered 2020-10-07: 2 g via INTRAVENOUS

## 2020-10-06 MED ORDER — SODIUM CHLORIDE 0.9 % IV SOLN: INTRAVENOUS | Status: DC

## 2020-10-06 MED ORDER — ORAL CARE MOUTH RINSE: 15.0000 mL | Freq: Once | OROMUCOSAL | Status: AC

## 2020-10-07 ENCOUNTER — Encounter
Admission: RE | Disposition: A | Discharge status: Home / Self Care | Payer: Self-pay | Source: Home / Self Care | Attending: Orthopedic Surgery

## 2020-10-07 ENCOUNTER — Ambulatory Visit: Payer: Medicare PPO | Admitting: Certified Registered Nurse Anesthetist

## 2020-10-07 ENCOUNTER — Ambulatory Visit: Payer: Self-pay | Admitting: *Deleted

## 2020-10-07 ENCOUNTER — Ambulatory Visit: Payer: Medicare PPO

## 2020-10-07 ENCOUNTER — Encounter: Payer: Self-pay | Admitting: Orthopedic Surgery

## 2020-10-07 ENCOUNTER — Ambulatory Visit
Admission: RE | Admit: 2020-10-07 | Discharge: 2020-10-07 | Disposition: A | Discharge status: Home / Self Care | Payer: Medicare PPO | Attending: Orthopedic Surgery | Admitting: Orthopedic Surgery

## 2020-10-07 DIAGNOSIS — Z8781 Personal history of (healed) traumatic fracture: Secondary | ICD-10-CM

## 2020-10-07 DIAGNOSIS — X58XXXA Exposure to other specified factors, initial encounter: Secondary | ICD-10-CM | POA: Insufficient documentation

## 2020-10-07 DIAGNOSIS — S52571A Other intraarticular fracture of lower end of right radius, initial encounter for closed fracture: Secondary | ICD-10-CM | POA: Diagnosis not present

## 2020-10-07 DIAGNOSIS — Z885 Allergy status to narcotic agent status: Secondary | ICD-10-CM | POA: Diagnosis not present

## 2020-10-07 DIAGNOSIS — Z886 Allergy status to analgesic agent status: Secondary | ICD-10-CM | POA: Diagnosis not present

## 2020-10-07 DIAGNOSIS — M25531 Pain in right wrist: Secondary | ICD-10-CM

## 2020-10-07 DIAGNOSIS — Z888 Allergy status to other drugs, medicaments and biological substances status: Secondary | ICD-10-CM | POA: Diagnosis not present

## 2020-10-07 DIAGNOSIS — Z79899 Other long term (current) drug therapy: Secondary | ICD-10-CM | POA: Insufficient documentation

## 2020-10-07 HISTORY — PX: ORIF WRIST FRACTURE: SHX2133

## 2020-10-07 LAB — GLUCOSE, CAPILLARY
Glucose-Capillary: 117 mg/dL — ABNORMAL HIGH (ref 70–99)
Glucose-Capillary: 97 mg/dL (ref 70–99)

## 2020-10-07 LAB — SARS CORONAVIRUS 2 (TAT 6-24 HRS): SARS Coronavirus 2: NEGATIVE

## 2020-10-07 SURGERY — OPEN REDUCTION INTERNAL FIXATION (ORIF) WRIST FRACTURE
Anesthesia: General | Site: Wrist | Laterality: Right

## 2020-10-07 MED ORDER — LIDOCAINE HCL (PF) 1 % IJ SOLN
INTRAMUSCULAR | Status: AC
  Filled 2020-10-07: qty 5

## 2020-10-07 MED ORDER — ONDANSETRON HCL 4 MG/2ML IJ SOLN
INTRAMUSCULAR | Status: DC | PRN
Start: 2020-10-07 — End: 2020-10-07
  Administered 2020-10-07: 4 mg via INTRAVENOUS

## 2020-10-07 MED ORDER — FENTANYL CITRATE (PF) 100 MCG/2ML IJ SOLN
INTRAMUSCULAR | Status: AC
  Administered 2020-10-07: 50 ug via INTRAVENOUS
  Filled 2020-10-07: qty 2

## 2020-10-07 MED ORDER — PROPOFOL 500 MG/50ML IV EMUL
INTRAVENOUS | Status: DC | PRN
  Administered 2020-10-07: 30 ug/kg/min via INTRAVENOUS

## 2020-10-07 MED ORDER — METOCLOPRAMIDE HCL 5 MG/ML IJ SOLN: 5.0000 mg | Freq: Three times a day (TID) | INTRAMUSCULAR | Status: DC | PRN

## 2020-10-07 MED ORDER — FENTANYL CITRATE (PF) 100 MCG/2ML IJ SOLN: 25.0000 ug | INTRAMUSCULAR | Status: DC | PRN

## 2020-10-07 MED ORDER — CEFAZOLIN SODIUM-DEXTROSE 2-4 GM/100ML-% IV SOLN
INTRAVENOUS | Status: AC
  Filled 2020-10-07: qty 100

## 2020-10-07 MED ORDER — ONDANSETRON HCL 4 MG/2ML IJ SOLN: 4.0000 mg | Freq: Four times a day (QID) | INTRAMUSCULAR | Status: DC | PRN

## 2020-10-07 MED ORDER — ROPIVACAINE HCL 5 MG/ML IJ SOLN
INTRAMUSCULAR | Status: DC | PRN
  Administered 2020-10-07: 30 mL via PERINEURAL

## 2020-10-07 MED ORDER — FENTANYL CITRATE (PF) 100 MCG/2ML IJ SOLN
INTRAMUSCULAR | Status: AC
  Filled 2020-10-07: qty 2

## 2020-10-07 MED ORDER — ROPIVACAINE HCL 5 MG/ML IJ SOLN
INTRAMUSCULAR | Status: AC
  Filled 2020-10-07: qty 30

## 2020-10-07 MED ORDER — FENTANYL CITRATE (PF) 100 MCG/2ML IJ SOLN: 50.0000 ug | Freq: Once | INTRAMUSCULAR | Status: AC

## 2020-10-07 MED ORDER — ACETAMINOPHEN 10 MG/ML IV SOLN
INTRAVENOUS | Status: AC
  Filled 2020-10-07: qty 100

## 2020-10-07 MED ORDER — PROPOFOL 10 MG/ML IV BOLUS
INTRAVENOUS | Status: AC
  Filled 2020-10-07: qty 40

## 2020-10-07 MED ORDER — ONDANSETRON HCL 4 MG/2ML IJ SOLN: 4.0000 mg | Freq: Once | INTRAMUSCULAR | Status: DC | PRN

## 2020-10-07 MED ORDER — CHLORHEXIDINE GLUCONATE 0.12 % MT SOLN
OROMUCOSAL | Status: AC
  Filled 2020-10-07: qty 15

## 2020-10-07 MED ORDER — LIDOCAINE HCL (PF) 1 % IJ SOLN
INTRAMUSCULAR | Status: DC | PRN
  Administered 2020-10-07: 3 mL

## 2020-10-07 MED ORDER — PROPOFOL 10 MG/ML IV BOLUS
INTRAVENOUS | Status: AC
  Filled 2020-10-07: qty 20

## 2020-10-07 MED ORDER — ACETAMINOPHEN 10 MG/ML IV SOLN
INTRAVENOUS | Status: DC | PRN
  Administered 2020-10-07: 650 mg via INTRAVENOUS

## 2020-10-07 MED ORDER — OXYCODONE HCL 5 MG PO TABS: 5.0000 mg | ORAL_TABLET | Freq: Three times a day (TID) | ORAL | 0 refills | Status: DC | PRN

## 2020-10-07 MED ORDER — ONDANSETRON HCL 4 MG PO TABS: 4.0000 mg | ORAL_TABLET | Freq: Four times a day (QID) | ORAL | Status: DC | PRN

## 2020-10-07 MED ORDER — PROPOFOL 10 MG/ML IV BOLUS
INTRAVENOUS | Status: DC | PRN
  Administered 2020-10-07: 30 mg via INTRAVENOUS
  Administered 2020-10-07: 20 mg via INTRAVENOUS
  Administered 2020-10-07 (×2): 30 mg via INTRAVENOUS
  Administered 2020-10-07: 20 mg via INTRAVENOUS

## 2020-10-07 MED ORDER — SODIUM CHLORIDE 0.9 % IV SOLN: INTRAVENOUS | Status: DC

## 2020-10-07 MED ORDER — METOCLOPRAMIDE HCL 10 MG PO TABS: 5.0000 mg | ORAL_TABLET | Freq: Three times a day (TID) | ORAL | Status: DC | PRN

## 2020-10-07 MED ORDER — MIDAZOLAM HCL 2 MG/2ML IJ SOLN: 1.0000 mg | Freq: Once | INTRAMUSCULAR | Status: AC

## 2020-10-07 MED ORDER — MIDAZOLAM HCL 2 MG/2ML IJ SOLN
INTRAMUSCULAR | Status: AC
  Administered 2020-10-07: 1 mg via INTRAVENOUS
  Filled 2020-10-07: qty 2

## 2020-10-07 SURGICAL SUPPLY — 48 items
APL PRP STRL LF DISP 70% ISPRP (MISCELLANEOUS) ×1
BIT DRILL 2 FAST STEP (BIT) ×2 IMPLANT
BIT DRILL 2.5X4 QC (BIT) ×2 IMPLANT
BNDG CMPR STD VLCR NS LF 5.8X4 (GAUZE/BANDAGES/DRESSINGS) ×1
BNDG ELASTIC 4X5.8 VLCR NS LF (GAUZE/BANDAGES/DRESSINGS) ×2 IMPLANT
BNDG ELASTIC 4X5.8 VLCR STR LF (GAUZE/BANDAGES/DRESSINGS) ×2 IMPLANT
CANISTER SUCT 1200ML W/VALVE (MISCELLANEOUS) ×2 IMPLANT
CHLORAPREP W/TINT 26 (MISCELLANEOUS) ×2 IMPLANT
COVER WAND RF STERILE (DRAPES) ×2 IMPLANT
CUFF TOURN SGL QUICK 18X4 (TOURNIQUET CUFF) IMPLANT
DRAPE FLUOR MINI C-ARM 54X84 (DRAPES) ×2 IMPLANT
ELECT CAUTERY BLADE 6.4 (BLADE) ×2 IMPLANT
ELECT REM PT RETURN 9FT ADLT (ELECTROSURGICAL) ×2
ELECTRODE REM PT RTRN 9FT ADLT (ELECTROSURGICAL) ×1 IMPLANT
GAUZE SPONGE 4X4 12PLY STRL (GAUZE/BANDAGES/DRESSINGS) ×2 IMPLANT
GAUZE XEROFORM 1X8 LF (GAUZE/BANDAGES/DRESSINGS) ×2 IMPLANT
GLOVE SURG SYN 9.0  PF PI (GLOVE) ×1
GLOVE SURG SYN 9.0 PF PI (GLOVE) ×1 IMPLANT
GOWN SRG 2XL LVL 4 RGLN SLV (GOWNS) ×1 IMPLANT
GOWN STRL NON-REIN 2XL LVL4 (GOWNS) ×2
GOWN STRL REUS W/ TWL LRG LVL3 (GOWN DISPOSABLE) ×1 IMPLANT
GOWN STRL REUS W/TWL LRG LVL3 (GOWN DISPOSABLE) ×2
K-WIRE 1.6 (WIRE) ×2
K-WIRE FX5X1.6XNS BN SS (WIRE) ×1
KIT TURNOVER KIT A (KITS) ×2 IMPLANT
KWIRE FX5X1.6XNS BN SS (WIRE) ×1 IMPLANT
MANIFOLD NEPTUNE II (INSTRUMENTS) ×2 IMPLANT
NEEDLE FILTER BLUNT 18X 1/2SAF (NEEDLE) ×1
NEEDLE FILTER BLUNT 18X1 1/2 (NEEDLE) ×1 IMPLANT
NS IRRIG 500ML POUR BTL (IV SOLUTION) ×2 IMPLANT
PACK EXTREMITY ARMC (MISCELLANEOUS) ×2 IMPLANT
PAD CAST CTTN 4X4 STRL (SOFTGOODS) ×1 IMPLANT
PADDING CAST COTTON 4X4 STRL (SOFTGOODS) ×2
PEG SUBCHONDRAL SMOOTH 2.0X14 (Peg) ×2 IMPLANT
PEG SUBCHONDRAL SMOOTH 2.0X16 (Peg) ×2 IMPLANT
PEG SUBCHONDRAL SMOOTH 2.0X18 (Peg) IMPLANT
PEG SUBCHONDRAL SMOOTH 2.0X20 (Peg) ×8 IMPLANT
PEG THREADED 2.5MMX18MM LONG (Peg) ×2 IMPLANT
PLATE SHORT 24.4X51.3 RT (Plate) ×2 IMPLANT
SCALPEL PROTECTED #15 DISP (BLADE) ×4 IMPLANT
SCREW CORT 3.5X10 LNG (Screw) ×6 IMPLANT
SLING ARM M TX990204 (SOFTGOODS) ×2 IMPLANT
SPLINT CAST 1 STEP 3X12 (MISCELLANEOUS) ×2 IMPLANT
SUT ETHILON 4-0 (SUTURE) ×2
SUT ETHILON 4-0 FS2 18XMFL BLK (SUTURE) ×1
SUT VICRYL 3-0 27IN (SUTURE) ×2 IMPLANT
SUTURE ETHLN 4-0 FS2 18XMF BLK (SUTURE) ×1 IMPLANT
SYR 3ML LL SCALE MARK (SYRINGE) ×2 IMPLANT

## 2020-10-07 NOTE — Transfer of Care (Addendum)
Immediate Anesthesia Transfer of Care Note  Patient: Yolanda Jimenez  Procedure(s) Performed: OPEN REDUCTION INTERNAL FIXATION (ORIF) WRIST FRACTURE (Right Wrist)  Patient Location: PACU  Anesthesia Type:Regional  Level of Consciousness: awake, alert  and oriented  Airway & Oxygen Therapy: Patient Spontanous Breathing and Patient connected to nasal cannula oxygen  Post-op Assessment: Report given to RN and Post -op Vital signs reviewed and stable  Post vital signs: Reviewed and stable  Last Vitals:  Vitals Value Taken Time  BP 148/81 10/07/20 1130  Temp 36.6 C 10/07/20 1130  Pulse 78 10/07/20 1135  Resp 13 10/07/20 1136  SpO2 100 % 10/07/20 1135  Vitals shown include unvalidated device data.  Last Pain:  Vitals:   10/07/20 1130  TempSrc:   PainSc: 0-No pain         Complications: No complications documented.

## 2020-10-07 NOTE — Op Note (Signed)
10/07/2020  11:27 AM  PATIENT:  Yolanda Jimenez  75 y.o. female  PRE-OPERATIVE DIAGNOSIS:  Acute pain of right wrist M25.531 comminuted distal intra-articular right radius fracture  POST-OPERATIVE DIAGNOSIS:  Acute pain of right wrist M25.531 same  PROCEDURE:  Procedure(s): OPEN REDUCTION INTERNAL FIXATION (ORIF) WRIST FRACTURE (Right)  SURGEON: Laurene Footman, MD  ASSISTANTS: None  ANESTHESIA:   regional  EBL:  Total I/O In: 465 [I.V.:300; IV Piggyback:165] Out: 1 [Blood:1]  BLOOD ADMINISTERED:none  DRAINS: none   LOCAL MEDICATIONS USED:  NONE  SPECIMEN:  No Specimen  DISPOSITION OF SPECIMEN:  N/A  COUNTS:  YES  TOURNIQUET:   Total Tourniquet Time Documented: Upper Arm (Right) - 42 minutes Total: Upper Arm (Right) - 42 minutes   IMPLANTS: [employer] innovations standard short right plate with multiple screws with pegs and one multidirectional threaded peg  DICTATION: .Dragon Dictation patient was brought to the operating room after a block was placed in the preop holding area.  With the arm well anesthetized the arm was prepped and draped in usual sterile fashion with appropriate patient edification and timeout carried out.  Tourniquet of the upper arm was raised to turn 50 mmHg and a volar incision was made centered over the FCR tendon with fingertrap traction applied off the end of the bed with about 10 pounds of traction to restore length.  The fracture was quite distal and had 2 distal intra-articular fragments.  After getting exposure going down to retracting the FCR radially to protect the artery and veins deep fascia incised and the muscle retracted with a pronator elevated off the proximal distal fragments.  The fracture was was quite comminuted and with plate was initially applied it was separate from the distal fragment and so was initials smooth pegs removed and replated with a multidirectional screw this with the plate held directly against the bone this gave good  fixation and the remaining smooth pegs could be placed without difficulty.  There is no intra-articular penetration of the plate placed as distally as possible to get subcortical fixation.  The plate was then brought to the shaft and 310 mm screws were placed traction was released and there did not appear to be distraction at the fracture site with near anatomic alignment.  Under fluoroscopic views with the wrist move motion the fracture appeared stable.  The wound was irrigated and and tourniquet let down there is no significant bleeding.  The wound was closed with 3-0 Vicryl subcutaneously followed by 4-0 nylon for the skin with Xeroform 4 x 4's web roll volar splint and Ace wrap applied.  PLAN OF CARE: Discharge to home after PACU  PATIENT DISPOSITION:  PACU - hemodynamically stable.

## 2020-10-07 NOTE — H&P (Signed)
Chief Complaint: Chief Complaint  Patient presents with  . Wrist Injury  Right wrist fracture seen in ER 10/02/20   Yolanda Jimenez is a 75 y.o. female who presents today for evaluation of right distal radius fracture that occurred 4 days ago. Patient went to urgent care facility where she had a closed reduction and splint applied. She was referred to [facility] to discuss surgery, patient not interested in traveling to [facility] and requesting that Dr. Rudene Christians will perform surgery on her right wrist. Patient has seen [place] in the past for numerous issues. Patient's pain is mild. No numbness or tingling throughout the right hand. No other pain throughout her body. She denies hitting her head or losing consciousness. She is right-hand dominant.  Past Medical History: Past Medical History:  Diagnosis Date  . Abnormal cytology 2015?  [name] Anemia 2010  ongoing  . Arthritis  . Cataract cortical, senile 09/2013  have had surgery both eyes  . COPD (chronic obstructive pulmonary disease) (CMS-HCC) ?  In my chart-not mentioned to me  . Diabetes mellitus type 2, uncomplicated (CMS-HCC) 7371?  not on meds  . GERD (gastroesophageal reflux disease) 2010?  [name] Glaucoma (increased eye pressure)  . History of abnormal cervical Pap smear 2015?  [name] Hyperlipidemia  . Hypertension  . IBS (irritable bowel syndrome)  . Major depressive disorder, recurrent, mild (CMS-HCC) 04/08/2020  . Osteoporosis, post-menopausal  . PVD (peripheral vascular disease) (CMS-HCC)  a. Carotid stenosis. b. Femoral and distal aortic disease. c. Prior ischemic right toe. Yolanda Jimenez)   Past Surgical History: Past Surgical History:  Procedure Laterality Date  . CATARACT EXTRACTION Bilateral 10/2013, 11/2013  . CERVICAL CONE BIOPSY  . CHOLECYSTECTOMY  . Foot surgery Right 1997  Right bunion and 2nd toe surgery  . FRACTURE SURGERY 1987  left wrist  . Left hip unipolar hemiarthroplasty Left 05/16/2018  Dr.Poggi  . TUBAL LIGATION 1985   Past Family  History: Family History  Problem Relation Age of Onset  . Myocardial Infarction (Heart attack) Mother  . High blood pressure (Hypertension) Mother  . Diabetes type II Mother  . Multiple myeloma Father  . Peripheral vascular disease Sister  . Diabetes type II Sister  . Skin cancer Sister  Squamous cell  . Diabetes type II Son  . Hyperlipidemia (Elevated cholesterol) Son  . High blood pressure (Hypertension) Son  . Diabetes type II Maternal Grandmother   Medications: Current Outpatient Medications Ordered in Epic  Medication Sig Dispense Refill  . acetaminophen (TYLENOL) 500 MG tablet Take 500 mg by mouth Take 500 mg by mouth every 6 (six) hours as needed.  [name] ascorbic acid, vitamin C, (VITAMIN C) 1000 MG tablet Take 1,000 mg by mouth once daily  . cholecalciferol (VITAMIN D3) 2,000 unit tablet Take 2,000 Units by mouth once daily  . diazePAM (VALIUM) 5 MG tablet Take 1 tablet (5 mg total) by mouth 2 (two) times daily as needed for Anxiety 60 tablet 5  . ferrous gluconate (FERGON) 240 (27 FE) MG tablet Take 1 tablet (240 mg total) by mouth every morning before breakfast  . Herbal Supplement Herbal Name: Cramp Defense, calcium  . loperamide (IMODIUM) 2 mg capsule Take 0.5 capsules by mouth once daily As needed  . omeprazole (PRILOSEC) 40 MG DR capsule TAKE 1 CAPSULE BY MOUTH EVERY DAY 90 capsule 3  . simvastatin (ZOCOR) 40 MG tablet Take 1 tablet (40 mg total) by mouth nightly 90 tablet 3  . temazepam (RESTORIL) 15 mg capsule TAKE 1 CAPSULE  BY MOUTH AT BEDTIME AS NEEDED FOR SLEEP 30 capsule 5  . venlafaxine (EFFEXOR-XR) 37.5 MG XR capsule Take 1 capsule (37.5 mg total) by mouth once daily 30 capsule 11  . VIT C/E/ZN/COPPR/LUTEIN/ZEAXAN (PRESERVISION AREDS 2 ORAL) Take 2 tablets by mouth once daily.   No current Epic-ordered facility-administered medications on file.   Allergies: Allergies  Allergen Reactions  . Lidocaine Other (See Comments)  Yolanda Jimenez states she believes she has  had a reaction to lidocaine before.  . Nsaids (Non-Steroidal Anti-Inflammatory Drug) Other (See Comments)  States it makes her fell drunk  . Oxycodone Unknown  . Vicodin [Hydrocodone-Acetaminophen] Other (See Comments)  Confusion/delirium  . Zoloft [Sertraline] Other (See Comments)  Hot flashes    Review of Systems:  A comprehensive 14 point ROS was performed, reviewed by me today, and the pertinent orthopaedic findings are documented in the HPI.  Exam: Wt (!) 44.5 kg (98 lb)  BMI 17.92 kg/m  General:  Well developed, well nourished, no apparent distress, normal affect, normal gait with no antalgic component.   HEENT: Head normocephalic, atraumatic, PERRL.   Abdomen: Soft, non tender, non distended, Bowel sounds present.  Heart: Examination of the heart reveals regular, rate, and rhythm. There is no murmur noted on ascultation. There is a normal apical pulse.  Lungs: Lungs are clear to auscultation. There is no wheeze, rhonchi, or crackles. There is normal expansion of bilateral chest walls.   Examination right upper extremity shows no swelling throughout the digits. Sensation is intact distally. She is in a well fitted sugar tong splint. Good range of motion of the shoulder with no discomfort. 2+ cap refill throughout the digits.  AP and lateral view of the right wrist ordered interpreted by me in the office today. Impression: Patient has a comminuted intra-articular distal radial metaphyseal fracture with significant shortening and dorsal angulation. Evidence of nondisplaced fracture along the distal ulna.  Impression: Other closed intra-articular fracture of distal end of right radius, initial encounter [S52.571A] Displaced closed intra-articular fracture of distal end of right radius, initial encounter (primary encounter diagnosis)  Plan:  23. 75 year old female 4 days out from right distal radius fracture with displacement and comminution. Risks, benefits, complications  of a right distal radius ORIF with volar plate have been discussed with the patient. Patient has agreed and consented the procedure with Dr. Hessie Knows tomorrow 10/07/2020.  This note was generated in part with voice recognition software and I apologize for any typographical errors that were not detected and corrected.  Feliberto Gottron MPA-C    Electronically signed by Feliberto Gottron, PA at 10/06/2020 12:07 PM EST   Reviewed  H+P. No changes noted.

## 2020-10-07 NOTE — Discharge Instructions (Addendum)
Keep arm elevated is much as possible the next 2 or 3 days. Work on moving your fingers all you can. Keep splint clean and dry. If fingers swell a great deal you can loosen the Ace wrap but leave splint and cotton roll in place. Pain medicine as directed.  AMBULATORY SURGERY  DISCHARGE INSTRUCTIONS   1) The drugs that you were given will stay in your system until tomorrow so for the next 24 hours you should not:  A) Drive an automobile B) Make any legal decisions C) Drink any alcoholic beverage   2) You may resume regular meals tomorrow.  Today it is better to start with liquids and gradually work up to solid foods.  You may eat anything you prefer, but it is better to start with liquids, then soup and crackers, and gradually work up to solid foods.   3) Please notify your doctor immediately if you have any unusual bleeding, trouble breathing, redness and pain at the surgery site, drainage, fever, or pain not relieved by medication.    4) Additional Instructions:  Leave green band for 4 days  Please contact your physician with any problems or Same Day Surgery at [phone], Monday through Friday 6 am to 4 pm, or [place] at [facility] number at [phone].   Interscalene Nerve Block (ISNB) Discharge Instructions   1.  For your surgery you have received an Interscalene Nerve Block. 2. Nerve Blocks affect many types of nerves, including nerves that control movement, pain and normal sensation.  You may experience feelings such as numbness, tingling, heaviness, weakness or the inability to move your arm or the feeling or sensation that your arm has "fallen asleep". 3. A nerve block can last for 2 - 36 hours or more depending on the medication used.  Usually the weakness wears off first.  The tingling and heaviness usually wear off next.  Finally you may start to notice pain.  Keep in mind that this may occur in any order.  once a nerve block starts to wear off it is usually  completely gone within 60 minutes. 4. ISNB may cause mild shortness of breath, a hoarse voice, blurry vision, unequal pupils, or drooping of the face on the same side as the nerve block.  These symptoms will usually go away within 12 hours.  Very rarely the procedure itself can cause mild seizures. 5. If needed, your surgeon will give you a prescription for pain medication.  It will take about 60 minutes for the oral pain medication to become fully effective.  So, it is recommended that you start taking this medication before the nerve block first begins to wear off, or when you first begin to feel discomfort. 6. Keep in mind that nerve blocks often wear off in the middle of the night.   If you are going to bed and the block has not started to wear off or you have not started to have any discomfort, consider setting an alarm for 2 to 3 hours, so you can assess your block.  If you notice the block is wearing off or you are starting to have discomfort, you can take your pain medication. 7. Take your pain medication only as prescribed.  Pain medication can cause sedation and decrease your breathing if you take more than you need for the level of pain that you have. 8. Nausea is a common side effect of many pain medications.  You may want to eat something before taking your pain  medicine to prevent nausea. 9. After an Interscalene nerve block, you cannot feel pain, pressure or extremes in temperature in the effected arm.  Because your arm is numb it is at an increased risk for injury.  To decrease the possibility of injury, please practice the following:  a. While you are awake change the position of your arm frequently to prevent too much pressure on any one area for prolonged periods of time. b.  If you have a cast or tight dressing, check the color or your fingers every couple of hours.  Call your surgeon with the appearance of any discoloration (white or blue). c. If you are given a sling to wear before  you go home, please wear it  at all times until the block has completely worn off.  Do not get up at night without your sling. d. If you experience any problems or concerns, please contact your surgeon's office. e. If you experience severe or prolonged shortness of breath go to the nearest emergency department.

## 2020-10-07 NOTE — Anesthesia Preprocedure Evaluation (Signed)
Anesthesia Evaluation  Patient identified by MRN, date of birth, ID band Patient awake    Reviewed: Allergy & Precautions, NPO status , Patient's Chart, lab work & pertinent test results  History of Anesthesia Complications Negative for: history of anesthetic complications  Airway Mallampati: II  TM Distance: >3 FB Neck ROM: Full    Dental no notable dental hx.    Pulmonary COPD, Current Smoker and Patient abstained from smoking.,    breath sounds clear to auscultation- rhonchi (-) wheezing      Cardiovascular Exercise Tolerance: Good (-) hypertension(-) CAD, (-) Past MI, (-) Cardiac Stents and (-) CABG  Rhythm:Regular Rate:Normal - Systolic murmurs and - Diastolic murmurs    Neuro/Psych neg Seizures negative neurological ROS  negative psych ROS   GI/Hepatic Neg liver ROS, GERD  ,  Endo/Other  diabetes (diet controlled)  Renal/GU Renal disease     Musculoskeletal  (+) Arthritis ,   Abdominal (+) - obese,   Peds  Hematology  (+) anemia ,   Anesthesia Other Findings Past Medical History: No date: Arthritis     Comment:  back No date: AVM (arteriovenous malformation) No date: Barrett esophagus No date: C. difficile diarrhea No date: Carotid stenosis No date: Diabetes mellitus without complication (HCC)     Comment:  borderline No date: Diverticulosis No date: GERD (gastroesophageal reflux disease) No date: Heart murmur No date: Hx of adenomatous colonic polyps No date: Hyperlipidemia No date: IBS (irritable bowel syndrome) No date: Insomnia No date: Iron deficiency anemia No date: Kidney cysts     Comment:  hx UTIs No date: Microscopic hematuria No date: Osteoporosis     Comment:  mild No date: PVD (peripheral vascular disease) (HCC) No date: Wears dentures     Comment:  partial lower   Reproductive/Obstetrics                             Anesthesia Physical Anesthesia  Plan  ASA: II  Anesthesia Plan: General   Post-op Pain Management:  Regional for Post-op pain   Induction: Intravenous  PONV Risk Score and Plan: 1 and Propofol infusion  Airway Management Planned: Natural Airway  Additional Equipment:   Intra-op Plan:   Post-operative Plan:   Informed Consent: I have reviewed the patients History and Physical, chart, labs and discussed the procedure including the risks, benefits and alternatives for the proposed anesthesia with the patient or authorized representative who has indicated his/her understanding and acceptance.     Dental advisory given  Plan Discussed with: CRNA and Anesthesiologist  Anesthesia Plan Comments:         Anesthesia Quick Evaluation

## 2020-10-07 NOTE — Anesthesia Postprocedure Evaluation (Signed)
Anesthesia Post Note  Patient: Yolanda Jimenez  Procedure(s) Performed: OPEN REDUCTION INTERNAL FIXATION (ORIF) WRIST FRACTURE (Right Wrist)  Patient location during evaluation: PACU Anesthesia Type: General Level of consciousness: awake and alert and oriented Pain management: pain level controlled Vital Signs Assessment: post-procedure vital signs reviewed and stable Respiratory status: spontaneous breathing, nonlabored ventilation and respiratory function stable Cardiovascular status: blood pressure returned to baseline and stable Postop Assessment: no signs of nausea or vomiting Anesthetic complications: no   No complications documented.   Last Vitals:  Vitals:   10/07/20 1212 10/07/20 1254  BP: (!) 144/71 (!) 92/56  Pulse: 72 (!) 57  Resp: 16 16  Temp: (!) 36.3 C (!) 36.1 C  SpO2: 94% 100%    Last Pain:  Vitals:   10/07/20 1254  TempSrc: Temporal  PainSc: 0-No pain                 Primitivo Merkey

## 2020-10-07 NOTE — Telephone Encounter (Signed)
[phone] Pt has had surgery on her hand today, had a nerve block and her fingers feel very hot. Is this normal, not in paperwork. Give a CB to pt.  Patient reports she has noticed her fingers feeling warm to touch after surgery for ORIF  Wrist fx. Slight swelling noted of fingers noted and dressing is CD&I. Not too tight. Reviewed AVS and post op information with patient and symptoms she may noticed of sensation after a nerve block. Patient reports she will apply a cool compress only to her fingers and keep extremity elevated. Patient also reports her nose has been constantly running since her oxygen was discontinued after surgery. Patient is required to keep tissue at her side due to constant runny nose. Denies nose burning , difficulty breathing or fever or nasal stuffiness. encouraged patient to call PCP or surgeon tomorrow if symptoms persist. Care advise given. Patient verbalized understanding of care advise and to call back or go to ED if symptoms worsen.  Reason for Disposition . Other post-op symptom or question  Answer Assessment - Initial Assessment Questions 1. SYMPTOM: "What's the main symptom you're concerned about?" (e.g., pain, fever, vomiting)     Fingers warm to touch after nerve block 2. ONSET: "When did na  start?"     na 3. SURGERY: "What surgery was performed?"     ORIF wrist fracture  4. DATE of SURGERY: "When was surgery performed?"      today 5. ANESTHESIA: " What type of anesthesia did you have?" (e.g., general, spinal, epidural, local)     Nerve block  6. PAIN: "Is there any pain?" If Yes, ask: "How bad is it?"  (Scale 1-10; or mild, moderate, severe)     no 7. FEVER: "Do you have a fever?" If Yes, ask: "What is your temperature, how was it measured, and when did it start?"     no 8. VOMITING: "Is there any vomiting?" If yes, ask: "How many times?"     no 9. BLEEDING: "Is there any bleeding?" If Yes, ask: "How much?" and "Where?"     no 10. OTHER SYMPTOMS: "Do  you have any other symptoms?" (e.g., drainage from wound, painful urination, constipation)       Fingers warm to touch , some swelling, and nose constantly runny.  Protocols used: POST-OP SYMPTOMS AND QUESTIONS-A-AH

## 2020-10-07 NOTE — Anesthesia Procedure Notes (Signed)
Anesthesia Regional Block: Supraclavicular block   Pre-Anesthetic Checklist: ,, timeout performed, Correct Patient, Correct Site, Correct Laterality, Correct Procedure, Correct Position, site marked, Risks and benefits discussed,  Surgical consent,  Pre-op evaluation,  At surgeon's request and post-op pain management  Laterality: Right  Prep: chloraprep       Needles:  Injection technique: Single-shot  Needle Type: Stimiplex     Needle Length: 10cm  Needle Gauge: 21     Additional Needles:   Procedures:,,,, ultrasound used (permanent image in chart),,,,  Narrative:  Start time: 10/07/2020 9:03 AM End time: 10/07/2020 9:07 AM Injection made incrementally with aspirations every 5 mL.  Performed by: Personally  Anesthesiologist: Emmie Niemann, MD  Additional Notes: Functioning IV was confirmed and monitors were applied.  A Stimuplex needle was used. Sterile prep and drape,hand hygiene and sterile gloves were used.  Negative aspiration and negative test dose prior to incremental administration of local anesthetic. The patient tolerated the procedure well.

## 2020-10-08 ENCOUNTER — Ambulatory Visit: Payer: Medicare PPO

## 2020-10-08 ENCOUNTER — Encounter: Payer: Self-pay | Admitting: Orthopedic Surgery

## 2020-10-21 ENCOUNTER — Ambulatory Visit (INDEPENDENT_AMBULATORY_CARE_PROVIDER_SITE_OTHER): Payer: Medicare PPO | Admitting: Vascular Surgery

## 2020-10-21 ENCOUNTER — Ambulatory Visit (INDEPENDENT_AMBULATORY_CARE_PROVIDER_SITE_OTHER): Payer: Medicare PPO

## 2020-10-21 ENCOUNTER — Encounter (INDEPENDENT_AMBULATORY_CARE_PROVIDER_SITE_OTHER): Payer: Self-pay | Admitting: Vascular Surgery

## 2020-10-21 ENCOUNTER — Other Ambulatory Visit: Payer: Self-pay

## 2020-10-21 VITALS — BP 121/68 | HR 60 | Resp 16 | Wt 93.2 lb

## 2020-10-21 DIAGNOSIS — E1151 Type 2 diabetes mellitus with diabetic peripheral angiopathy without gangrene: Secondary | ICD-10-CM

## 2020-10-21 DIAGNOSIS — I6523 Occlusion and stenosis of bilateral carotid arteries: Secondary | ICD-10-CM | POA: Diagnosis not present

## 2020-10-21 DIAGNOSIS — E785 Hyperlipidemia, unspecified: Secondary | ICD-10-CM

## 2020-10-21 DIAGNOSIS — I739 Peripheral vascular disease, unspecified: Secondary | ICD-10-CM

## 2020-10-21 NOTE — Assessment & Plan Note (Signed)
Her carotid duplex today reveals stable calcified plaque with 1 to 39% ICA stenosis bilaterally.  Continue Zocor.  Recheck in 1 year.

## 2020-10-21 NOTE — Assessment & Plan Note (Signed)
ABIs today are 1.13 on the right and 1.09 on the left with triphasic waveforms.  She has known aortoiliac calcification but it has not caused any significant lower extremity ischemia at this point.  Continue to follow annually.

## 2020-10-21 NOTE — Progress Notes (Signed)
MRN : [id number]  Yolanda Jimenez is a 75 y.o. (Oct 19, 1945) female who presents with chief complaint of  Chief Complaint  Patient presents with   Follow-up    Ultrasound follow up  .  History of Present Illness: Patient returns today in follow up of multiple vascular issues.  She recently had a fractured wrist and had to have surgery by orthopedics about 2 weeks ago.  She is still wearing her splint still has her stitches in place.  This is still causing her pain.  She denies any focal neurologic symptoms. Specifically, the patient denies amaurosis fugax, speech or swallowing difficulties, or arm or leg weakness or numbness.  Her carotid duplex today reveals stable calcified plaque with 1 to 39% ICA stenosis bilaterally.  Current Outpatient Medications  Medication Sig Dispense Refill   Ascorbic Acid (VITAMIN C) 1000 MG tablet Take 1,000 mg by mouth daily.     Calcium Carb-Cholecalciferol (CALCIUM 600 + D PO) Take 600 mg by mouth daily.     Cholecalciferol 50 MCG (2000 UT) CAPS Take 2,000 Units by mouth daily.     diazepam (VALIUM) 5 MG tablet Take 1 tablet (5 mg total) by mouth daily as needed for anxiety. 10 tablet 0   fluticasone (FLONASE) 50 MCG/ACT nasal spray Place 1 spray into both nostrils daily as needed for allergies or rhinitis.     loperamide (IMODIUM) 2 MG capsule Take 2 mg by mouth as needed for diarrhea or loose stools.     Multiple Vitamins-Minerals (PRESERVISION AREDS 2 PO) Take 1 tablet by mouth in the morning and at bedtime.     omeprazole (PRILOSEC) 40 MG capsule Take 40 mg by mouth daily.     simvastatin (ZOCOR) 40 MG tablet Take 40 mg by mouth at bedtime.     temazepam (RESTORIL) 15 MG capsule Take 1 capsule (15 mg total) by mouth at bedtime as needed for sleep. 10 capsule 0   venlafaxine XR (EFFEXOR-XR) 37.5 MG 24 hr capsule Take 37.5 mg by mouth daily with breakfast.     vitamin B-12 (CYANOCOBALAMIN) 500 MCG tablet Take 500 mcg by mouth daily.      oxyCODONE (ROXICODONE) 5 MG immediate release tablet Take 1 tablet (5 mg total) by mouth every 8 (eight) hours as needed. (Patient not taking: No sig reported) 20 tablet 0   No current facility-administered medications for this visit.    Past Medical History:  Diagnosis Date   Arthritis    back   AVM (arteriovenous malformation)    Barrett esophagus    C. difficile diarrhea    Carotid stenosis    Diabetes mellitus without complication (HCC)    borderline   Diverticulosis    GERD (gastroesophageal reflux disease)    Heart murmur    Hx of adenomatous colonic polyps    Hyperlipidemia    IBS (irritable bowel syndrome)    Insomnia    Iron deficiency anemia    Kidney cysts    hx UTIs   Microscopic hematuria    Osteoporosis    mild   PVD (peripheral vascular disease) ([place])    Wears dentures    partial lower    Past Surgical History:  Procedure Laterality Date   BUNIONECTOMY     right foot   CATARACT EXTRACTION     both eyes   CERVICAL CONE BIOPSY     CHOLECYSTECTOMY     COLONOSCOPY     COLONOSCOPY WITH PROPOFOL N/A 01/22/2019  Procedure: COLONOSCOPY WITH BIOPSY;  Surgeon: Lucilla Lame, MD;  Location: [place];  Service: Endoscopy;  Laterality: N/A;  diabetic - diet controlled   COLONOSCOPY WITH PROPOFOL N/A 06/27/2020   Procedure: COLONOSCOPY WITH BIOPSY;  Surgeon: Lucilla Lame, MD;  Location: [place];  Service: Endoscopy;  Laterality: N/A;  priority 4   ESOPHAGOGASTRODUODENOSCOPY (EGD) WITH PROPOFOL N/A 08/29/2017   Procedure: ESOPHAGOGASTRODUODENOSCOPY (EGD) WITH PROPOFOL;  Surgeon: Lucilla Lame, MD;  Location: [place];  Service: Endoscopy;  Laterality: N/A;   FOOT SURGERY     right 2nd toe   HIP ARTHROPLASTY Left 05/16/2018   Procedure: ARTHROPLASTY BIPOLAR HIP (HEMIARTHROPLASTY);  Surgeon: Corky Mull, MD;  Location: [facility];  Service: Orthopedics;  Laterality: Left;   ORIF WRIST FRACTURE Right 10/07/2020    Procedure: OPEN REDUCTION INTERNAL FIXATION (ORIF) WRIST FRACTURE;  Surgeon: Hessie Knows, MD;  Location: [facility];  Service: Orthopedics;  Laterality: Right;   POLYPECTOMY N/A 01/22/2019   Procedure: POLYPECTOMY;  Surgeon: Lucilla Lame, MD;  Location: [place];  Service: Endoscopy;  Laterality: N/A;  2 Clips placed at polyp removal site in ascending colon   POLYPECTOMY N/A 06/27/2020   Procedure: POLYPECTOMY;  Surgeon: Lucilla Lame, MD;  Location: [place];  Service: Endoscopy;  Laterality: N/A;   TUBAL LIGATION     UPPER GASTROINTESTINAL ENDOSCOPY       Social History   Tobacco Use   Smoking status: Current Every Day Smoker    Packs/day: 0.50    Years: 55.00    Pack years: 27.50    Types: Cigarettes   Smokeless tobacco: Never Used   Tobacco comment: she has patches to start just not started yet. smoked since teenager.  Vaping Use   Vaping Use: Never used  Substance Use Topics   Alcohol use: Yes    Alcohol/week: 2.0 standard drinks    Types: 2 Glasses of wine per week    Comment: socially   Drug use: No     Family History  Problem Relation Age of Onset   Diabetes Mother    Heart disease Mother    Hypertension Mother    Diabetes Son    Diabetes Sister    Inflammatory bowel disease Father        diverticulitis   Multiple myeloma Father    Hypertension Son    Diabetes Maternal Grandmother    Colon cancer Neg Hx    Esophageal cancer Neg Hx    Rectal cancer Neg Hx    Stomach cancer Neg Hx    Breast cancer Neg Hx     Allergies  Allergen Reactions   Codeine     "felt drunk, crazy"   Hydrocodone-Acetaminophen Other (See Comments)    Confusion/delirium   Lidocaine     Heart racing, increased BP - from local at dentist (likely epi)   Nsaids Other (See Comments)    States it makes her fell drunk   Oxycodone     Confusion/delirium     REVIEW OF SYSTEMS (Negative unless checked)  Constitutional: []Weight loss   []Fever  []Chills Cardiac: []Chest pain   []Chest pressure   []Palpitations   []Shortness of breath when laying flat   []Shortness of breath at rest   []Shortness of breath with exertion. Vascular:  []Pain in legs with walking   [x]Pain in legs at rest   []Pain in legs when laying flat   []Claudication   []Pain in feet when walking  []Pain in feet  at rest  []Pain in feet when laying flat   []History of DVT   []Phlebitis   []Swelling in legs   []Varicose veins   []Non-healing ulcers Pulmonary:   []Uses home oxygen   []Productive cough   []Hemoptysis   []Wheeze  []COPD   []Asthma Neurologic:  []Dizziness  []Blackouts   []Seizures   []History of stroke   []History of TIA  []Aphasia   []Temporary blindness   []Dysphagia   []Weakness or numbness in arms   []Weakness or numbness in legs Musculoskeletal:  [x]Arthritis   []Joint swelling   [x]Joint pain   []Low back pain Hematologic:  []Easy bruising  []Easy bleeding   []Hypercoagulable state   []Anemic   Gastrointestinal:  []Blood in stool   []Vomiting blood  []Gastroesophageal reflux/heartburn   []Abdominal pain Genitourinary:  []Chronic kidney disease   []Difficult urination  []Frequent urination  []Burning with urination   []Hematuria Skin:  []Rashes   []Ulcers   []Wounds Psychological:  []History of anxiety   [] History of major depression.  Physical Examination  BP 121/68 (BP Location: Right Arm)    Pulse 60    Resp 16    Wt 93 lb 3.2 oz (42.3 kg)    BMI 17.61 kg/m  Gen:  WD/WN, NAD. Appears younger than stated age. Head: [place]/AT, No temporalis wasting. Ear/Nose/Throat: Hearing grossly intact, nares w/o erythema or drainage Eyes: Conjunctiva clear. Sclera non-icteric Neck: Supple.  Trachea midline. Right carotid bruit is present. Pulmonary:  Good air movement, no use of accessory muscles.  Cardiac: RRR, no JVD Vascular:  Vessel Right Left  Radial Palpable Palpable                          PT Palpable Palpable  DP Palpable Palpable    Gastrointestinal: soft, non-tender/non-distended. No guarding/reflex.  Musculoskeletal: M/S 5/5 throughout.  No deformity or atrophy. No edema.  Right wrist is in a splint. Neurologic: Sensation grossly intact in extremities.  Symmetrical.  Speech is fluent.  Psychiatric: Judgment intact, Mood & affect appropriate for pt's clinical situation. Dermatologic: No rashes or ulcers noted.  No cellulitis or open wounds.       Labs Recent Results (from the past 2160 hour(s))  Urinalysis, Complete     Status: Abnormal   Collection Time: 09/17/20  9:28 AM  Result Value Ref Range   Specific Gravity, UA >1.030 (H) 1.005 - 1.030   pH, UA 5.0 5.0 - 7.5   Color, UA Yellow Yellow   Appearance Ur Clear Clear   Leukocytes,UA Negative Negative   Protein,UA 1+ (A) Negative/Trace   Glucose, UA Negative Negative   Ketones, UA Negative Negative   RBC, UA 2+ (A) Negative   Bilirubin, UA Negative Negative   Urobilinogen, Ur 0.2 0.2 - 1.0 mg/dL   Nitrite, UA Negative Negative   Microscopic Examination See below:   Microscopic Examination     Status: Abnormal   Collection Time: 09/17/20  9:28 AM   Urine  Result Value Ref Range   WBC, UA 0-5 0 - 5 /hpf   RBC 11-30 (A) 0 - 2 /hpf   Epithelial Cells (non renal) 0-10 0 - 10 /hpf   Casts Present (A) None seen /lpf   Cast Type Hyaline casts N/A   Bacteria, UA None seen None seen/Few  SARS CORONAVIRUS 2 (TAT 6-24 HRS) Nasopharyngeal Nasopharyngeal Swab     Status: None   Collection Time: 10/06/20 12:43 PM  Nasopharyngeal Swab  °Result Value Ref Range  ° SARS Coronavirus 2 NEGATIVE NEGATIVE  °  Comment: (NOTE) °SARS-CoV-2 target nucleic acids are NOT DETECTED. ° °The SARS-CoV-2 RNA is generally detectable in upper and lower °respiratory specimens during the acute phase of infection. Negative °results do not preclude SARS-CoV-2 infection, do not rule out °co-infections with other pathogens, and should not be used as the °sole basis for treatment  or other patient management decisions. °Negative results must be combined with clinical observations, °patient history, and epidemiological information. The expected °result is Negative. ° °Fact Sheet for Patients: °https://www.fda.gov/media/138098/download ° °Fact Sheet for Healthcare Providers: °https://www.fda.gov/media/138095/download ° °This test is not yet approved or cleared by the [place] FDA and  °has been authorized for detection and/or diagnosis of SARS-CoV-2 by °FDA under an Emergency Use Authorization (EUA). This EUA will remain  °in effect (meaning this test can be used) for the duration of the °COVID-19 declaration under Se ction 564(b)(1) of the Act, 21 U.S.C. °section 360bbb-3(b)(1), unless the authorization is terminated or °revoked sooner. ° °Performed at [facility], [address] [place], [place] °[address] °  °Glucose, capillary     Status: Abnormal  ° Collection Time: 10/07/20  8:05 AM  °Result Value Ref Range  ° Glucose-Capillary 117 (H) 70 - 99 mg/dL  °  Comment: Glucose reference range applies only to samples taken after fasting for at least 8 hours.  °Glucose, capillary     Status: None  ° Collection Time: 10/07/20 11:35 AM  °Result Value Ref Range  ° Glucose-Capillary 97 70 - 99 mg/dL  °  Comment: Glucose reference range applies only to samples taken after fasting for at least 8 hours.  ° ° °Radiology °DG Wrist 2 Views Right ° °Result Date: 10/07/2020 °CLINICAL DATA:  Postop EXAM: RIGHT WRIST - 2 VIEW COMPARISON:  None. FINDINGS: Plate and screw fixation noted in the distal right radius. No hardware complicating feature. No subluxation or dislocation. IMPRESSION: Internal fixation in the distal right radius. No visible complicating feature. Electronically Signed   By: Kevin  Dover M.D.   On: 10/07/2020 12:07  ° °US OR NERVE BLOCK-IMAGE ONLY ([facility]) ° °Result Date: 10/07/2020 °There is no interpretation for this exam.  This order is for images obtained during a surgical  procedure.  Please See "Surgeries" Tab for more information regarding the procedure.  ° °DG MINI C-ARM IMAGE ONLY ° °Result Date: 10/07/2020 °There is no interpretation for this exam.  This order is for images obtained during a surgical procedure.  Please See "Surgeries" Tab for more information regarding the procedure.  ° ° °Assessment/Plan °Diabetes mellitus, type 2 °blood glucose control important in reducing the progression of atherosclerotic disease. Also, involved in wound healing. On appropriate medications. °  °  °Hyperlipidemia °lipid control important in reducing the progression of atherosclerotic disease. Continue statin therapy ° °Carotid stenosis °Her carotid duplex today reveals stable calcified plaque with 1 to 39% ICA stenosis bilaterally.  Continue Zocor.  Recheck in 1 year. ° °PAD (peripheral artery disease) (HCC) °ABIs today are 1.13 on the right and 1.09 on the left with triphasic waveforms.  She has known aortoiliac calcification but it has not caused any significant lower extremity ischemia at this point.  Continue to follow annually. ° ° ° °Jason Dew, MD ° °10/21/2020 °9:52 AM ° ° ° °This note was created with Dragon medical transcription system.  Any errors from dictation are purely unintentional °

## 2020-10-24 ENCOUNTER — Other Ambulatory Visit: Payer: Self-pay

## 2020-10-24 ENCOUNTER — Ambulatory Visit
Admission: RE | Admit: 2020-10-24 | Discharge: 2020-10-24 | Disposition: A | Discharge status: Home / Self Care | Payer: Medicare PPO | Source: Ambulatory Visit | Attending: Urology | Admitting: Urology

## 2020-10-24 DIAGNOSIS — Z87448 Personal history of other diseases of urinary system: Secondary | ICD-10-CM | POA: Diagnosis not present

## 2020-10-24 DIAGNOSIS — N281 Cyst of kidney, acquired: Secondary | ICD-10-CM | POA: Insufficient documentation

## 2020-10-27 ENCOUNTER — Telehealth: Payer: Self-pay

## 2020-10-27 NOTE — Telephone Encounter (Signed)
Pt aware and verbalized understanding.  

## 2020-10-27 NOTE — Telephone Encounter (Signed)
-----   Message from Hollice Espy, MD sent at 10/27/2020  3:14 PM EDT ----- This renal ultrasound looks fine.  The cyst in the left kidney appears to be simple and not worrisome.  Hollice Espy, MD

## 2021-07-20 NOTE — Progress Notes (Signed)
07/22/21 12:25 PM   Yolanda Jimenez 1946/02/22 [id number]  Referring provider:  Rusty Aus, MD [facility] [facility] [place],  [place] [address] Chief Complaint  Patient presents with   Recurrent UTI     HPI: Yolanda Jimenez is a 75 y.o.female with a personal history of hematuria and left renal cyst, who presents today for further evaluation of recurrent UTI.   Urine on 04/30/2021 grew E.coli. It grew E.coli again on 06/22/2021 she was prescribed Bactrim.   She reports that she had 5 UTIs in the last 3 months. She has been to the walk-in and she has also self treated with AZO. She reports that she has been urinating more frequently she has been urinating more during the nighttime, she has been having trouble sleeping. She has had a weak urinary flow. She reports that she has IBS.  She reports that this has been under poor control as she has had a lot more emotional distress exacerbating her symptoms.  She had a lot of loose stool and concern for fecal contamination.  She is very emotional today, she recently lost her significant other 4 weeks ago. She also has a sister with many medical issues who she is looking after.   PMH: Past Medical History:  Diagnosis Date   Arthritis    back   AVM (arteriovenous malformation)    Barrett esophagus    C. difficile diarrhea    Carotid stenosis    Diabetes mellitus without complication (HCC)    borderline   Diverticulosis    GERD (gastroesophageal reflux disease)    Heart murmur    Hx of adenomatous colonic polyps    Hyperlipidemia    IBS (irritable bowel syndrome)    Insomnia    Iron deficiency anemia    Kidney cysts    hx UTIs   Microscopic hematuria    Osteoporosis    mild   PVD (peripheral vascular disease) ([place])    Wears dentures    partial lower    Surgical History: Past Surgical History:  Procedure Laterality Date   BUNIONECTOMY     right foot   CATARACT EXTRACTION     both eyes    CERVICAL CONE BIOPSY     CHOLECYSTECTOMY     COLONOSCOPY     COLONOSCOPY WITH PROPOFOL N/A 01/22/2019   Procedure: COLONOSCOPY WITH BIOPSY;  Surgeon: Lucilla Lame, MD;  Location: [place];  Service: Endoscopy;  Laterality: N/A;  diabetic - diet controlled   COLONOSCOPY WITH PROPOFOL N/A 06/27/2020   Procedure: COLONOSCOPY WITH BIOPSY;  Surgeon: Lucilla Lame, MD;  Location: [place];  Service: Endoscopy;  Laterality: N/A;  priority 4   ESOPHAGOGASTRODUODENOSCOPY (EGD) WITH PROPOFOL N/A 08/29/2017   Procedure: ESOPHAGOGASTRODUODENOSCOPY (EGD) WITH PROPOFOL;  Surgeon: Lucilla Lame, MD;  Location: [place];  Service: Endoscopy;  Laterality: N/A;   FOOT SURGERY     right 2nd toe   HIP ARTHROPLASTY Left 05/16/2018   Procedure: ARTHROPLASTY BIPOLAR HIP (HEMIARTHROPLASTY);  Surgeon: Corky Mull, MD;  Location: [facility];  Service: Orthopedics;  Laterality: Left;   ORIF WRIST FRACTURE Right 10/07/2020   Procedure: OPEN REDUCTION INTERNAL FIXATION (ORIF) WRIST FRACTURE;  Surgeon: Hessie Knows, MD;  Location: [facility];  Service: Orthopedics;  Laterality: Right;   POLYPECTOMY N/A 01/22/2019   Procedure: POLYPECTOMY;  Surgeon: Lucilla Lame, MD;  Location: [place];  Service: Endoscopy;  Laterality: N/A;  2 Clips placed at polyp removal site  in ascending colon   POLYPECTOMY N/A 06/27/2020   Procedure: POLYPECTOMY;  Surgeon: Lucilla Lame, MD;  Location: [place];  Service: Endoscopy;  Laterality: N/A;   TUBAL LIGATION     UPPER GASTROINTESTINAL ENDOSCOPY      Home Medications:  Allergies as of 07/21/2021       Reactions   Codeine    "felt drunk, crazy"   Hydrocodone-acetaminophen Other (See Comments)   Confusion/delirium   Lidocaine    Heart racing, increased BP - from local at dentist (likely epi)   Nsaids Other (See Comments)   States it makes her fell drunk   Oxycodone    Confusion/delirium        Medication List        Accurate  as of July 21, 2021 11:59 PM. If you have any questions, ask your nurse or doctor.          STOP taking these medications    CALCIUM 600 + D PO Stopped by: Hollice Espy, MD   mirtazapine 30 MG tablet Commonly known as: REMERON Stopped by: Hollice Espy, MD   oxyCODONE 5 MG immediate release tablet Commonly known as: Roxicodone Stopped by: Hollice Espy, MD   PARoxetine 20 MG tablet Commonly known as: PAXIL Stopped by: Hollice Espy, MD   temazepam 15 MG capsule Commonly known as: RESTORIL Stopped by: Hollice Espy, MD   venlafaxine XR 37.5 MG 24 hr capsule Commonly known as: EFFEXOR-XR Stopped by: Hollice Espy, MD       TAKE these medications    Cholecalciferol 50 MCG (2000 UT) Caps Take 2,000 Units by mouth daily.   diazepam 5 MG tablet Commonly known as: VALIUM Take 1 tablet (5 mg total) by mouth daily as needed for anxiety.   diltiazem 30 MG tablet Commonly known as: CARDIZEM Take 30 mg by mouth every morning.   fluticasone 50 MCG/ACT nasal spray Commonly known as: FLONASE Place 1 spray into both nostrils daily as needed for allergies or rhinitis.   loperamide 2 MG capsule Commonly known as: IMODIUM Take 2 mg by mouth as needed for diarrhea or loose stools.   omeprazole 40 MG capsule Commonly known as: PRILOSEC Take 40 mg by mouth daily.   PRESERVISION AREDS 2 PO Take 1 tablet by mouth in the morning and at bedtime.   simvastatin 40 MG tablet Commonly known as: ZOCOR Take 40 mg by mouth at bedtime.   vitamin B-12 500 MCG tablet Commonly known as: CYANOCOBALAMIN Take 500 mcg by mouth daily.   vitamin C 1000 MG tablet Take 1,000 mg by mouth daily.        Allergies:  Allergies  Allergen Reactions   Codeine     "felt drunk, crazy"   Hydrocodone-Acetaminophen Other (See Comments)    Confusion/delirium   Lidocaine     Heart racing, increased BP - from local at dentist (likely epi)   Nsaids Other (See Comments)    States  it makes her fell drunk   Oxycodone     Confusion/delirium    Family History: Family History  Problem Relation Age of Onset   Diabetes Mother    Heart disease Mother    Hypertension Mother    Diabetes Son    Diabetes Sister    Inflammatory bowel disease Father        diverticulitis   Multiple myeloma Father    Hypertension Son    Diabetes Maternal Grandmother    Colon cancer Neg Hx    Esophageal  cancer Neg Hx    Rectal cancer Neg Hx    Stomach cancer Neg Hx    Breast cancer Neg Hx    Social History:  reports that she has been smoking cigarettes. She has a 27.50 pack-year smoking history. She has never used smokeless tobacco. She reports current alcohol use of about 2.0 standard drinks per week. She reports that she does not use drugs.  Physical Exam: BP (!) 146/95   Pulse 84   Ht 5' 1"  (1.549 m)   Wt 96 lb (43.5 kg)   BMI 18.14 kg/m   Constitutional:  Alert and oriented, No acute distress. HEENT: [place] AT, moist mucus membranes.  Trachea midline, no masses. Cardiovascular: No clubbing, cyanosis, or edema. Respiratory: Normal respiratory effort, no increased work of breathing. Skin: No rashes, bruises or suspicious lesions. Neurologic: Grossly intact, no focal deficits, moving all 4 extremities. Psychiatric: Normal mood and affect.  Laboratory Data:  Lab Results  Component Value Date   CREATININE 0.80 07/12/2018   Urinalysis 11-30 RBCs   Pertinent imaging:  Results for orders placed or performed in visit on 07/21/21  Microscopic Examination   Urine  Result Value Ref Range   WBC, UA 0-5 0 - 5 /hpf   RBC 11-30 (A) 0 - 2 /hpf   Epithelial Cells (non renal) 0-10 0 - 10 /hpf   Casts Present (A) None seen /lpf   Cast Type Hyaline casts N/A   Mucus, UA Present (A) Not Estab.   Bacteria, UA None seen None seen/Few  Urinalysis, Complete  Result Value Ref Range   Specific Gravity, UA 1.025 1.005 - 1.030   pH, UA 6.0 5.0 - 7.5   Color, UA Yellow Yellow   Appearance  Ur Clear Clear   Leukocytes,UA Negative Negative   Protein,UA Trace (A) Negative/Trace   Glucose, UA Negative Negative   Ketones, UA Trace (A) Negative   RBC, UA 1+ (A) Negative   Bilirubin, UA Negative Negative   Urobilinogen, Ur 0.2 0.2 - 1.0 mg/dL   Nitrite, UA Negative Negative   Microscopic Examination See below:   BLADDER SCAN AMB NON-IMAGING  Result Value Ref Range   Scan Result 0 ml     Assessment & Plan:    Recurrent UTI - Counseled her on UTI prevention supplements such as cranberry tablets, probiotics, yogurt that has active lactobacillus culture, and d-mannose  - We discussed topical estrogen cream if her infections continue -she would like to hold off for the time being but may consider this in the future if the above does not decrease the frequency or intensity of her infections  2. Urinary urgency/frequency/ weak urinary stream  - May be secondary to recent infections.  - She was offered medication for OAB symptoms today which she declined in light of her wanting to heal from the infections and monitoring if her urinary symptoms improve.   3. Microscopic hematuria  -Persistent s/p previous work ups - Present today in urine; 11-30 RBCs - Will further evaluate during scheduled cystoscopy in March   Return for scheduled cystoscopy in 10/2021  Conley Rolls as a scribe for Hollice Espy, MD.,have documented all relevant documentation on the behalf of Hollice Espy, MD,as directed by  Hollice Espy, MD while in the presence of Hollice Espy, MD.  I have reviewed the above documentation for accuracy and completeness, and I agree with the above.   Hollice Espy, MD   [place] [address] [place], [place] [address] [phone]  I spent 31 total minutes on the day of the encounter including pre-visit review of the medical record, face-to-face time with the patient, and post visit ordering of  labs/imaging/tests.  Majority of this time was spent face-to-face with the patient providing not only medical but emotional support.

## 2021-07-21 ENCOUNTER — Ambulatory Visit: Payer: Medicare PPO | Admitting: Urology

## 2021-07-21 ENCOUNTER — Other Ambulatory Visit: Payer: Self-pay

## 2021-07-21 VITALS — BP 146/95 | HR 84 | Ht 61.0 in | Wt 96.0 lb

## 2021-07-21 DIAGNOSIS — N390 Urinary tract infection, site not specified: Secondary | ICD-10-CM | POA: Diagnosis not present

## 2021-07-21 DIAGNOSIS — R3129 Other microscopic hematuria: Secondary | ICD-10-CM

## 2021-07-21 LAB — MICROSCOPIC EXAMINATION: Bacteria, UA: NONE SEEN

## 2021-07-21 LAB — URINALYSIS, COMPLETE
Bilirubin, UA: NEGATIVE
Glucose, UA: NEGATIVE
Leukocytes,UA: NEGATIVE
Nitrite, UA: NEGATIVE
Specific Gravity, UA: 1.025 (ref 1.005–1.030)
Urobilinogen, Ur: 0.2 mg/dL (ref 0.2–1.0)
pH, UA: 6 (ref 5.0–7.5)

## 2021-07-21 LAB — BLADDER SCAN AMB NON-IMAGING: Scan Result: 0

## 2021-07-21 NOTE — Patient Instructions (Addendum)
Cranberry tablets as directed. Probiotics as directed. D-Mannose as directed.

## 2021-09-21 NOTE — Progress Notes (Signed)
° °  09/22/21  CC:  Chief Complaint  Patient presents with   Cysto     HPI: Yolanda Jimenez is a 76 y.o.female with a personal history of hematuria, left renal cyst, rUTIs, urinary urgency/frequency/weak urinary stream, who presents today for a cystoscopy and urinalysis.   Her most recent U[facility] was [facility] She grew E.coli that was resistant to Ampicillin, Cefazolin, Ceftriaxone, and Cefuroxime   Last cystoscopy on 09/2020 was unremarkable.  Ua today with 3-10 RBC    Vitals:   09/22/21 0916  BP: (!) 153/75  Pulse: 13  NED. A&Ox3.   No respiratory distress   Abd soft, NT, ND Normal external genitalia with patent urethral meatus  Cystoscopy Procedure Note  Patient identification was confirmed, informed consent was obtained, and patient was prepped using Betadine solution.  Lidocaine jelly was administered per urethral meatus.    Procedure: - Flexible cystoscope introduced, without any difficulty.   - Thorough search of the bladder revealed:    normal urethral meatus    normal urothelium    no stones    no ulcers     no tumors    no urethral polyps    mild trabeculation  - Ureteral orifices were normal in position and appearance.  Post-Procedure: - Patient tolerated the procedure well  Assessment/ Plan:  1. Microscopic hematuria - Cystoscopy unremarkable again today - Given one time dose of Keflex  - Urinalysis; Complete  F/u annually with UA or sooner as needed  I,Kailey Littlejohn,acting as a scribe for Hollice Espy, MD.,have documented all relevant documentation on the behalf of Hollice Espy, MD,as directed by  Hollice Espy, MD while in the presence of Hollice Espy, MD.  I have reviewed the above documentation for accuracy and completeness, and I agree with the above.   Hollice Espy, MD

## 2021-09-22 ENCOUNTER — Encounter: Payer: Self-pay | Admitting: Urology

## 2021-09-22 ENCOUNTER — Ambulatory Visit: Payer: Medicare PPO | Admitting: Urology

## 2021-09-22 ENCOUNTER — Other Ambulatory Visit: Payer: Self-pay

## 2021-09-22 VITALS — BP 153/75 | HR 62 | Ht 61.0 in | Wt 96.0 lb

## 2021-09-22 DIAGNOSIS — R3129 Other microscopic hematuria: Secondary | ICD-10-CM

## 2021-09-22 LAB — URINALYSIS, COMPLETE
Bilirubin, UA: NEGATIVE
Glucose, UA: NEGATIVE
Nitrite, UA: NEGATIVE
Specific Gravity, UA: >1.03 — ABNORMAL HIGH (ref 1.005–1.030)
Urobilinogen, Ur: 0.2 mg/dL (ref 0.2–1.0)
pH, UA: 6 (ref 5.0–7.5)

## 2021-09-22 LAB — MICROSCOPIC EXAMINATION

## 2021-09-22 MED ORDER — CEPHALEXIN 250 MG PO CAPS
500.0000 mg | ORAL_CAPSULE | Freq: Once | ORAL | Status: AC
  Administered 2021-09-22: 500 mg via ORAL

## 2021-09-28 ENCOUNTER — Telehealth: Payer: Self-pay

## 2021-09-28 NOTE — Progress Notes (Signed)
09/29/21 9:25 AM   Yolanda Jimenez 1946-08-04 [id number]  Referring provider:  Rusty Aus, MD [facility] [place],  [place] [address] Chief Complaint  Patient presents with   Dysuria     HPI: Yolanda Jimenez is a 76 y.o.female with a personal history of hematuria, left renal cyst, rUTIs, urinary urgency/frequency/weak urinary stream, who presents today for further evaluation of dysuria after cystoscopy.   Her most recent U[facility] was on [facility] She grew E.coli that was resistant to Ampicillin, Cefazolin, Ceftriaxone, and Cefuroxime   She underwent cystoscopy on 09/22/2021 that was unremarkable. Urinalysis at the time showed trace leukocytes, trace ketones, and 2+ RBCs. She was given one time dose of Keflex that say.    She started having severe urinary burning on Sunday.  The pain was so bad that she could feel pain all the way down to her fingertips with urination.  She called our office on Monday but did not feel like she should wait.  She reports that she went to [facility].  She does not know whether a culture or not was sent.  She was started on Bactrim for 7 days.  After only 1 dose, she started to feel better.  She was told to follow-up with urology today.  PMH: Past Medical History:  Diagnosis Date   Arthritis    back   AVM (arteriovenous malformation)    Barrett esophagus    C. difficile diarrhea    Carotid stenosis    Diabetes mellitus without complication (HCC)    borderline   Diverticulosis    GERD (gastroesophageal reflux disease)    Heart murmur    Hx of adenomatous colonic polyps    Hyperlipidemia    IBS (irritable bowel syndrome)    Insomnia    Iron deficiency anemia    Kidney cysts    hx UTIs   Microscopic hematuria    Osteoporosis    mild   PVD (peripheral vascular disease) ([place])    Wears dentures    partial lower    Surgical History: Past Surgical History:  Procedure Laterality Date    BUNIONECTOMY     right foot   CATARACT EXTRACTION     both eyes   CERVICAL CONE BIOPSY     CHOLECYSTECTOMY     COLONOSCOPY     COLONOSCOPY WITH PROPOFOL N/A 01/22/2019   Procedure: COLONOSCOPY WITH BIOPSY;  Surgeon: Lucilla Lame, MD;  Location: [place];  Service: Endoscopy;  Laterality: N/A;  diabetic - diet controlled   COLONOSCOPY WITH PROPOFOL N/A 06/27/2020   Procedure: COLONOSCOPY WITH BIOPSY;  Surgeon: Lucilla Lame, MD;  Location: [place];  Service: Endoscopy;  Laterality: N/A;  priority 4   ESOPHAGOGASTRODUODENOSCOPY (EGD) WITH PROPOFOL N/A 08/29/2017   Procedure: ESOPHAGOGASTRODUODENOSCOPY (EGD) WITH PROPOFOL;  Surgeon: Lucilla Lame, MD;  Location: [place];  Service: Endoscopy;  Laterality: N/A;   FOOT SURGERY     right 2nd toe   HIP ARTHROPLASTY Left 05/16/2018   Procedure: ARTHROPLASTY BIPOLAR HIP (HEMIARTHROPLASTY);  Surgeon: Corky Mull, MD;  Location: [facility];  Service: Orthopedics;  Laterality: Left;   ORIF WRIST FRACTURE Right 10/07/2020   Procedure: OPEN REDUCTION INTERNAL FIXATION (ORIF) WRIST FRACTURE;  Surgeon: Hessie Knows, MD;  Location: [facility];  Service: Orthopedics;  Laterality: Right;   POLYPECTOMY N/A 01/22/2019   Procedure: POLYPECTOMY;  Surgeon: Lucilla Lame, MD;  Location: [employer];  Service: Endoscopy;  Laterality: N/A;  2 Clips placed at polyp removal site in ascending colon   POLYPECTOMY N/A 06/27/2020   Procedure: POLYPECTOMY;  Surgeon: Lucilla Lame, MD;  Location: [place];  Service: Endoscopy;  Laterality: N/A;   TUBAL LIGATION     UPPER GASTROINTESTINAL ENDOSCOPY      Home Medications:  Allergies as of 09/29/2021       Reactions   Codeine    "felt drunk, crazy"   Hydrocodone-acetaminophen Other (See Comments)   Confusion/delirium   Lidocaine    Heart racing, increased BP - from local at dentist (likely epi)   Nsaids Other (See Comments)   States it makes her fell drunk   Oxycodone     Confusion/delirium        Medication List        Accurate as of September 29, 2021  9:25 AM. If you have any questions, ask your nurse or doctor.          Cholecalciferol 50 MCG (2000 UT) Caps Take 2,000 Units by mouth daily.   CRANBERRY PO Take by mouth.   diazepam 5 MG tablet Commonly known as: VALIUM Take 1 tablet (5 mg total) by mouth daily as needed for anxiety.   diltiazem 30 MG tablet Commonly known as: CARDIZEM Take 30 mg by mouth every morning.   estradiol 0.1 MG/GM vaginal cream Commonly known as: ESTRACE Estrogen Cream Instruction Discard applicator Apply pea sized amount to tip of finger to urethra before bed. Wash hands well after application. Use Monday, Wednesday and Friday Started by: Hollice Espy, MD   fluticasone 50 MCG/ACT nasal spray Commonly known as: FLONASE Place 1 spray into both nostrils daily as needed for allergies or rhinitis.   loperamide 2 MG capsule Commonly known as: IMODIUM Take 2 mg by mouth as needed for diarrhea or loose stools.   omeprazole 40 MG capsule Commonly known as: PRILOSEC Take 40 mg by mouth daily.   PRESERVISION AREDS 2 PO Take 1 tablet by mouth in the morning and at bedtime.   simvastatin 40 MG tablet Commonly known as: ZOCOR Take 40 mg by mouth at bedtime.   sulfamethoxazole-trimethoprim 800-160 MG tablet Commonly known as: BACTRIM DS Take by mouth.   vitamin B-12 500 MCG tablet Commonly known as: CYANOCOBALAMIN Take 500 mcg by mouth daily.   vitamin C 1000 MG tablet Take 1,000 mg by mouth daily.        Allergies:  Allergies  Allergen Reactions   Codeine     "felt drunk, crazy"   Hydrocodone-Acetaminophen Other (See Comments)    Confusion/delirium   Lidocaine     Heart racing, increased BP - from local at dentist (likely epi)   Nsaids Other (See Comments)    States it makes her fell drunk   Oxycodone     Confusion/delirium    Family History: Family History  Problem Relation Age of  Onset   Diabetes Mother    Heart disease Mother    Hypertension Mother    Diabetes Son    Diabetes Sister    Inflammatory bowel disease Father        diverticulitis   Multiple myeloma Father    Hypertension Son    Diabetes Maternal Grandmother    Colon cancer Neg Hx    Esophageal cancer Neg Hx    Rectal cancer Neg Hx    Stomach cancer Neg Hx    Breast cancer Neg Hx     Social History:  reports that she has been smoking  cigarettes. She has a 27.50 pack-year smoking history. She has never used smokeless tobacco. She reports current alcohol use of about 2.0 standard drinks per week. She reports that she does not use drugs.   Physical Exam: BP 115/70    Pulse 84    Ht 5' 1"  (1.549 m)    Wt 96 lb (43.5 kg)    BMI 18.14 kg/m   Constitutional:  Alert and oriented, No acute distress. HEENT: [place] AT, moist mucus membranes.  Trachea midline, no masses. Cardiovascular: No clubbing, cyanosis, or edema. Respiratory: Normal respiratory effort, no increased work of breathing. Skin: No rashes, bruises or suspicious lesions. Neurologic: Grossly intact, no focal deficits, moving all 4 extremities. Psychiatric: Normal mood and affect.  Laboratory Data: Lab Results  Component Value Date   CREATININE 0.80 07/12/2018   Urinalysis No results found for any visits on 09/29/21.   Assessment & Plan:   Recurrent UTI - Recommend she add topical vaginal to her regimen. She is agreeable with this plan we will go ahead and start with pea-sized amount per urethral meatus for 2 weeks straight and then Monday Wednesday Friday to the urethral meatus only. - Vaginal estrogen cream prescribed  -  Counseled her in UTI prevention supplements such as cranberry tablets, probiotics, yogurt that has active lactobacillus culture, and d-mannose  -Complete course of Bactrim as prescribed, adjust as needed based on culture results  Follow-up as scheduled or sooner as needed  I,Kailey Littlejohn,acting as a scribe  for Hollice Espy, MD.,have documented all relevant documentation on the behalf of Hollice Espy, MD,as directed by  Hollice Espy, MD while in the presence of Hollice Espy, MD.  I have reviewed the above documentation for accuracy and completeness, and I agree with the above.   Hollice Espy, MD    [facility] [address] [place], [place] [address] [phone]

## 2021-09-28 NOTE — Telephone Encounter (Signed)
Pt called complaining of dysuria after cystoscopy. States her symptoms started over the weekend. Pt added to dr Erlene Quan schedule tomorrow. Pt aware and verbalized understanding. Pt denies any fever, chills, or any other symptoms at this time.

## 2021-09-29 ENCOUNTER — Ambulatory Visit: Payer: Medicare PPO | Admitting: Urology

## 2021-09-29 ENCOUNTER — Other Ambulatory Visit: Payer: Self-pay

## 2021-09-29 ENCOUNTER — Encounter: Payer: Self-pay | Admitting: Urology

## 2021-09-29 VITALS — BP 115/70 | HR 84 | Ht 61.0 in | Wt 96.0 lb

## 2021-09-29 DIAGNOSIS — R300 Dysuria: Secondary | ICD-10-CM

## 2021-09-29 LAB — MICROSCOPIC EXAMINATION: Bacteria, UA: NONE SEEN

## 2021-09-29 LAB — URINALYSIS, COMPLETE
Bilirubin, UA: NEGATIVE
Glucose, UA: NEGATIVE
Nitrite, UA: NEGATIVE
Specific Gravity, UA: 1.025 (ref 1.005–1.030)
Urobilinogen, Ur: 0.2 mg/dL (ref 0.2–1.0)
pH, UA: 6 (ref 5.0–7.5)

## 2021-09-29 MED ORDER — ESTRADIOL 0.1 MG/GM VA CREA: TOPICAL_CREAM | VAGINAL | 12 refills | Status: DC

## 2021-09-29 NOTE — Patient Instructions (Addendum)
Estrogen Cream Instruction Discard applicator Apply pea sized amount to tip of finger to urethra before bed. Wash hands well after application. Use every day for two weeks then Monday, Wednesday and Friday   Take D-Mannose, Cranberry tablets, and probiotics.

## 2021-10-20 ENCOUNTER — Ambulatory Visit (INDEPENDENT_AMBULATORY_CARE_PROVIDER_SITE_OTHER): Payer: Medicare PPO | Admitting: Vascular Surgery

## 2021-10-20 ENCOUNTER — Ambulatory Visit (INDEPENDENT_AMBULATORY_CARE_PROVIDER_SITE_OTHER): Payer: Medicare PPO

## 2021-10-20 ENCOUNTER — Encounter (INDEPENDENT_AMBULATORY_CARE_PROVIDER_SITE_OTHER): Payer: Self-pay | Admitting: Vascular Surgery

## 2021-10-20 ENCOUNTER — Other Ambulatory Visit: Payer: Self-pay

## 2021-10-20 VITALS — BP 123/74 | HR 75 | Resp 15 | Wt 102.0 lb

## 2021-10-20 DIAGNOSIS — E1151 Type 2 diabetes mellitus with diabetic peripheral angiopathy without gangrene: Secondary | ICD-10-CM

## 2021-10-20 DIAGNOSIS — I739 Peripheral vascular disease, unspecified: Secondary | ICD-10-CM

## 2021-10-20 DIAGNOSIS — I700 Atherosclerosis of aorta: Secondary | ICD-10-CM | POA: Diagnosis not present

## 2021-10-20 DIAGNOSIS — I6523 Occlusion and stenosis of bilateral carotid arteries: Secondary | ICD-10-CM

## 2021-10-20 DIAGNOSIS — E785 Hyperlipidemia, unspecified: Secondary | ICD-10-CM

## 2021-10-20 NOTE — Assessment & Plan Note (Signed)
Although her digital pressures are reduced bilaterally, her ABIs are preserved bilaterally at 1.19 on the right and 1.0 on the left.  No role for intervention.  Recheck in 1 year.  Continue current medical regimen ?

## 2021-10-20 NOTE — Assessment & Plan Note (Signed)
Carotid duplex today reveals stable 1 to 39% ICA stenosis bilaterally with minimal disease on each side.  No role for intervention.  No change in medications.  Recheck in 1 year. ?

## 2021-10-20 NOTE — Progress Notes (Signed)
? ? ?MRN : [id number] ? ?Yolanda Jimenez is a 76 y.o. (24-Nov-1945) female who presents with chief complaint of  ?Chief Complaint  ?Patient presents with  ? Follow-up  ?  Ultrasound follow up  ?. ? ?History of Present Illness: Patient returns today in follow up of multiple vascular issues.  She has been dealing with the loss of her spouse which has been difficult for her.  Physically, she has been fine.  Her feet are still cold and purplish, but she is not having a ton of pain.  No ulceration or infection.  Although her digital pressures are reduced bilaterally, her ABIs are preserved bilaterally at 1.19 on the right and 1.0 on the left. ?He is also followed for her carotid disease.  No focal neurologic symptoms. Specifically, the patient denies amaurosis fugax, speech or swallowing difficulties, or arm or leg weakness or numbness. Carotid duplex today reveals stable 1 to 39% ICA stenosis bilaterally with minimal disease on each side ? ? ?Current Outpatient Medications  ?Medication Sig Dispense Refill  ? Ascorbic Acid (VITAMIN C) 1000 MG tablet Take 1,000 mg by mouth daily.    ? Cholecalciferol 50 MCG (2000 UT) CAPS Take 2,000 Units by mouth daily.    ? CRANBERRY PO Take by mouth.    ? diazepam (VALIUM) 5 MG tablet Take 1 tablet (5 mg total) by mouth daily as needed for anxiety. 10 tablet 0  ? diltiazem (CARDIZEM) 30 MG tablet Take 30 mg by mouth every morning.    ? estradiol (ESTRACE) 0.1 MG/GM vaginal cream Estrogen Cream Instruction Discard applicator Apply pea sized amount to tip of finger to urethra before bed. Wash hands well after application. Use Monday, Wednesday and Friday 42.5 g 12  ? fluticasone (FLONASE) 50 MCG/ACT nasal spray Place 1 spray into both nostrils daily as needed for allergies or rhinitis.    ? loperamide (IMODIUM) 2 MG capsule Take 2 mg by mouth as needed for diarrhea or loose stools.    ? Multiple Vitamins-Minerals (PRESERVISION AREDS 2 PO) Take 1 tablet by mouth in the morning and at  bedtime.    ? omeprazole (PRILOSEC) 40 MG capsule Take 40 mg by mouth daily.    ? simvastatin (ZOCOR) 40 MG tablet Take 40 mg by mouth at bedtime.    ? vitamin B-12 (CYANOCOBALAMIN) 500 MCG tablet Take 500 mcg by mouth daily.    ? ?No current facility-administered medications for this visit.  ? ? ?Past Medical History:  ?Diagnosis Date  ? Arthritis   ? back  ? AVM (arteriovenous malformation)   ? Barrett esophagus   ? C. difficile diarrhea   ? Carotid stenosis   ? Diabetes mellitus without complication ([place])   ? borderline  ? Diverticulosis   ? GERD (gastroesophageal reflux disease)   ? Heart murmur   ? Hx of adenomatous colonic polyps   ? Hyperlipidemia   ? IBS (irritable bowel syndrome)   ? Insomnia   ? Iron deficiency anemia   ? Kidney cysts   ? hx UTIs  ? Microscopic hematuria   ? Osteoporosis   ? mild  ? PVD (peripheral vascular disease) ([place])   ? Wears dentures   ? partial lower  ? ? ?Past Surgical History:  ?Procedure Laterality Date  ? BUNIONECTOMY    ? right foot  ? CATARACT EXTRACTION    ? both eyes  ? CERVICAL CONE BIOPSY    ? CHOLECYSTECTOMY    ? COLONOSCOPY    ?  COLONOSCOPY WITH PROPOFOL N/A 01/22/2019  ? Procedure: COLONOSCOPY WITH BIOPSY;  Surgeon: Lucilla Lame, MD;  Location: [place];  Service: Endoscopy;  Laterality: N/A;  diabetic - diet controlled  ? COLONOSCOPY WITH PROPOFOL N/A 06/27/2020  ? Procedure: COLONOSCOPY WITH BIOPSY;  Surgeon: Lucilla Lame, MD;  Location: [place];  Service: Endoscopy;  Laterality: N/A;  priority 4  ? ESOPHAGOGASTRODUODENOSCOPY (EGD) WITH PROPOFOL N/A 08/29/2017  ? Procedure: ESOPHAGOGASTRODUODENOSCOPY (EGD) WITH PROPOFOL;  Surgeon: Lucilla Lame, MD;  Location: [place];  Service: Endoscopy;  Laterality: N/A;  ? FOOT SURGERY    ? right 2nd toe  ? HIP ARTHROPLASTY Left 05/16/2018  ? Procedure: ARTHROPLASTY BIPOLAR HIP (HEMIARTHROPLASTY);  Surgeon: Corky Mull, MD;  Location: [facility];  Service: Orthopedics;  Laterality: Left;  ? ORIF  WRIST FRACTURE Right 10/07/2020  ? Procedure: OPEN REDUCTION INTERNAL FIXATION (ORIF) WRIST FRACTURE;  Surgeon: Hessie Knows, MD;  Location: [facility];  Service: Orthopedics;  Laterality: Right;  ? POLYPECTOMY N/A 01/22/2019  ? Procedure: POLYPECTOMY;  Surgeon: Lucilla Lame, MD;  Location: [place];  Service: Endoscopy;  Laterality: N/A;  2 Clips placed at polyp removal site in ascending colon  ? POLYPECTOMY N/A 06/27/2020  ? Procedure: POLYPECTOMY;  Surgeon: Lucilla Lame, MD;  Location: [place];  Service: Endoscopy;  Laterality: N/A;  ? TUBAL LIGATION    ? UPPER GASTROINTESTINAL ENDOSCOPY    ? ? ? ?Social History  ? ?Tobacco Use  ? Smoking status: Every Day  ?  Packs/day: 0.50  ?  Years: 55.00  ?  Pack years: 27.50  ?  Types: Cigarettes  ? Smokeless tobacco: Never  ? Tobacco comments:  ?  she has patches to start just not started yet. smoked since teenager.  ?Vaping Use  ? Vaping Use: Never used  ?Substance Use Topics  ? Alcohol use: Yes  ?  Alcohol/week: 2.0 standard drinks  ?  Types: 2 Glasses of wine per week  ?  Comment: socially  ? Drug use: No  ? ?  ? ? ?Family History  ?Problem Relation Age of Onset  ? Diabetes Mother   ? Heart disease Mother   ? Hypertension Mother   ? Diabetes Son   ? Diabetes Sister   ? Inflammatory bowel disease Father   ?     diverticulitis  ? Multiple myeloma Father   ? Hypertension Son   ? Diabetes Maternal Grandmother   ? Colon cancer Neg Hx   ? Esophageal cancer Neg Hx   ? Rectal cancer Neg Hx   ? Stomach cancer Neg Hx   ? Breast cancer Neg Hx   ? ? ? ?Allergies  ?Allergen Reactions  ? Codeine   ?  "felt drunk, crazy"  ? Hydrocodone-Acetaminophen Other (See Comments)  ?  Confusion/delirium  ? Lidocaine   ?  Heart racing, increased BP - from local at dentist (likely epi)  ? Nsaids Other (See Comments)  ?  States it makes her fell drunk  ? Oxycodone   ?  Confusion/delirium  ? ? ? ?REVIEW OF SYSTEMS (Negative unless checked) ? ?Constitutional: [] Weight loss  [] Fever   [] Chills ?Cardiac: [] Chest pain   [] Chest pressure   [] Palpitations   [] Shortness of breath when laying flat   [] Shortness of breath at rest   [] Shortness of breath with exertion. ?Vascular:  [] Pain in legs with walking   [] Pain in legs at rest   [] Pain in legs when laying flat   [] Claudication   []   Pain in feet when walking  [] Pain in feet at rest  [] Pain in feet when laying flat   [] History of DVT   [] Phlebitis   [] Swelling in legs   [] Varicose veins   [] Non-healing ulcers ?Pulmonary:   [] Uses home oxygen   [] Productive cough   [] Hemoptysis   [] Wheeze  [] COPD   [] Asthma ?Neurologic:  [] Dizziness  [] Blackouts   [] Seizures   [] History of stroke   [] History of TIA  [] Aphasia   [] Temporary blindness   [] Dysphagia   [] Weakness or numbness in arms   [] Weakness or numbness in legs ?Musculoskeletal:  [x] Arthritis   [] Joint swelling   [] Joint pain   [] Low back pain ?Hematologic:  [] Easy bruising  [] Easy bleeding   [] Hypercoagulable state   [] Anemic   ?Gastrointestinal:  [] Blood in stool   [] Vomiting blood  [x] Gastroesophageal reflux/heartburn   [] Abdominal pain ?Genitourinary:  [] Chronic kidney disease   [] Difficult urination  [] Frequent urination  [] Burning with urination   [x] Hematuria ?Skin:  [] Rashes   [] Ulcers   [] Wounds ?Psychological:  [] History of anxiety   [x]  History of major depression. ? ?Physical Examination ? ?BP 123/74 (BP Location: Right Arm)   Pulse 75   Resp 15   Wt 102 lb (46.3 kg)   BMI 19.27 kg/m?  ?Gen:  WD/WN, NAD. Appears younger than stated age. ?Head: [place]/AT, No temporalis wasting. ?Ear/Nose/Throat: Hearing grossly intact, nares w/o erythema or drainage ?Eyes: Conjunctiva clear. Sclera non-icteric ?Neck: Supple.  Trachea midline. No bruit.  ?Pulmonary:  Good air movement, no use of accessory muscles.  ?Cardiac: RRR, no JVD ?Vascular:  ?Vessel Right Left  ?Radial Palpable Palpable  ?    ?    ?    ?    ?    ?    ?PT Palpable Palpable  ?DP Palpable Palpable  ? ? ?Musculoskeletal: M/S 5/5  throughout.  No deformity or atrophy. No edema. ?Neurologic: Sensation grossly intact in extremities.  Symmetrical.  Speech is fluent.  ?Psychiatric: Judgment intact, Mood & affect appropriate for pt's clinical situation.

## 2021-12-06 ENCOUNTER — Other Ambulatory Visit: Payer: Self-pay

## 2021-12-06 ENCOUNTER — Emergency Department
Admission: EM | Admit: 2021-12-06 | Discharge: 2021-12-06 | Disposition: A | Discharge status: Home / Self Care | Payer: Medicare PPO | Attending: Student in an Organized Health Care Education/Training Program | Admitting: Student in an Organized Health Care Education/Training Program

## 2021-12-06 ENCOUNTER — Emergency Department: Payer: Medicare PPO

## 2021-12-06 ENCOUNTER — Encounter: Payer: Self-pay | Admitting: Emergency Medicine

## 2021-12-06 DIAGNOSIS — S9002XA Contusion of left ankle, initial encounter: Secondary | ICD-10-CM | POA: Diagnosis not present

## 2021-12-06 DIAGNOSIS — M25562 Pain in left knee: Secondary | ICD-10-CM | POA: Diagnosis not present

## 2021-12-06 DIAGNOSIS — Y92002 Bathroom of unspecified non-institutional (private) residence single-family (private) house as the place of occurrence of the external cause: Secondary | ICD-10-CM | POA: Insufficient documentation

## 2021-12-06 DIAGNOSIS — S79912A Unspecified injury of left hip, initial encounter: Secondary | ICD-10-CM | POA: Diagnosis present

## 2021-12-06 DIAGNOSIS — M9702XA Periprosthetic fracture around internal prosthetic left hip joint, initial encounter: Secondary | ICD-10-CM | POA: Diagnosis not present

## 2021-12-06 DIAGNOSIS — M978XXA Periprosthetic fracture around other internal prosthetic joint, initial encounter: Secondary | ICD-10-CM

## 2021-12-06 DIAGNOSIS — W010XXA Fall on same level from slipping, tripping and stumbling without subsequent striking against object, initial encounter: Secondary | ICD-10-CM | POA: Insufficient documentation

## 2021-12-06 MED ORDER — TRAMADOL HCL 50 MG PO TABS: 50.0000 mg | ORAL_TABLET | Freq: Three times a day (TID) | ORAL | 0 refills | Status: DC | PRN

## 2021-12-06 MED ORDER — TRAMADOL HCL 50 MG PO TABS
50.0000 mg | ORAL_TABLET | Freq: Once | ORAL | Status: AC
  Administered 2021-12-06: 50 mg via ORAL
  Filled 2021-12-06: qty 1

## 2021-12-06 NOTE — ED Triage Notes (Signed)
Presents via EMS s/p trip and fall  landed on left side  having pain to left ankle,knee and hip  questionable deformity noted to ankle  good pulses ?

## 2021-12-06 NOTE — ED Provider Notes (Signed)
I assumed care of this patient approximately 1500.  Please see outgoing providers note for full details guarding patient's initial evaluation assessment.  In brief patient presents history of a left hip fracture that was replaced for evaluation of new hip pain after a mechanical fall.  She is found to have a periprosthetic hip fracture on imaging.  I provided to discussed with on-call orthopedist Dr. Roland Rack who recommended no emergent surgical intervention and bearing as tolerated with plan to admit if patient is able to do so.  She is able to ambulate using a walker.  He states that she was comfortable going home.  She does not want any Percocet or Norco as she is requesting couple tablets of tramadol which has worked well for her in the past.  I think this is reasonable.  She feels comfortable going home with her son who is also in agreement this plan.  They will follow-up with Dr. Arnette Schaumann.  No other acute concerns at this time.  Discharged in stable condition. ?  ?Yolanda Starch, MD ?12/06/21 1548 ? ?

## 2021-12-06 NOTE — ED Provider Notes (Signed)
? ?[facility] ?Provider Note ? ? ? Event Date/Time  ? First MD Initiated Contact with Patient 12/06/21 1152   ?  (approximate) ? ? ?History  ? ?Fall (/) ? ? ?HPI ? ?Yolanda Jimenez is a 76 y.o. female with history of type 2 diabetes, left hip fracture with replacement and as listed in EMR presents to the emergency department for treatment and evaluation after mechanical, non-syncopal fall at home. She states she was getting out of her recliner to go to the bathroom and fix her hair before church when she slipped on the hardwood floor and landed on her left side. She denies striking her head or loss of consciousness. Now complaining of left hip, left knee, and left ankle pain. Fentanyl  given enroute with significant improvement of pain.. ? ?  ? ? ?Physical Exam  ? ?Triage Vital Signs: ?ED Triage Vitals  ?Enc Vitals Group  ?   BP 12/06/21 1205 (!) 149/75  ?   Pulse Rate 12/06/21 1205 88  ?   Resp 12/06/21 1205 18  ?   Temp 12/06/21 1205 98 ?F (36.7 ?C)  ?   Temp Source 12/06/21 1205 Oral  ?   SpO2 12/06/21 1205 98 %  ?   Weight 12/06/21 1152 102 lb (46.3 kg)  ?   Height 12/06/21 1152 '5\' 1"'$  (1.549 m)  ?   Head Circumference --   ?   Peak Flow --   ?   Pain Score 12/06/21 1152 4  ?   Pain Loc --   ?   Pain Edu? --   ?   Excl. in [place]? --   ? ? ?Most recent vital signs: ?Vitals:  ? 12/06/21 1205  ?BP: (!) 149/75  ?Pulse: 88  ?Resp: 18  ?Temp: 98 ?F (36.7 ?C)  ?SpO2: 98%  ? ? ?General: Awake, no distress.  ?CV:  Good peripheral perfusion.  ?Resp:  Normal effort.  ?Abd:  No distention.  ?Other:  No shortening or rotation of the left lower extremity.  Lateral aspect of the left hip is tender on palpation.  Diffuse tenderness over the left knee without open wound or deformity.  Hematoma noted over the left lateral malleolus.  Patient is able to demonstrate range of motion of the left ankle.  She is able to flex the left knee.  Left hip flexion is reduced. ? ? ?ED Results / Procedures / Treatments   ? ?Labs ?(all labs ordered are listed, but only abnormal results are displayed) ?Labs Reviewed - No data to display ? ? ?EKG ? ?Not indicated. ? ? ?RADIOLOGY ? ?Image and radiology report reviewed by me. ? ?Image of the left hip shows a mildly displaced periprosthetic femoral head fracture. ?PROCEDURES: ? ?Critical Care performed: No ? ?Procedures ? ? ?MEDICATIONS ORDERED IN ED: ?Medications  ?traMADol (ULTRAM) tablet 50 mg (50 mg Oral Given 12/06/21 1441)  ? ? ? ?IMPRESSION / MDM / ASSESSMENT AND PLAN / ED COURSE  ? ?I have reviewed the triage note. ? ?Differential diagnosis includes, but is not limited to: hip fracture, femur fracture, knee injury, ankle sprain, ankle fracture ? ?76 year old female presenting to the emergency department for treatment and evaluation after mechanical, nonsyncopal fall at home.  See HPI for further details. ? ?Exam is overall reassuring with the exception of hematoma to the lateral aspect of the left ankle.  Plan will be to get images of the left hip, knee, and ankle.  Patient is awake, alert,  oriented and denies striking her head or loss of consciousness.  She denies having a headache.  She is not currently on a blood thinner.  No indication of CT head or cervical spine at this time. ? ?----------------------------------------- ?2:33 PM on 12/06/2021 ?----------------------------------------- ?X-ray shows a periprosthetic fracture of the left greater trochanter.  Case discussed with Dr. Benson Setting who was able to view the image.  Plan will be to attempt to get patient up with a walker and see if she is able to bear weight.  Ace bandage will be applied to the left ankle as well. If she is able to tolerate weight bearing, she can go home and will follow up outpatient. If not, will admit for pain control and OT/PT. Patient aware and agreeable to the plan. Tramadol ordered.  ? ?----------------------------------------- ?3:13 PM on  12/06/2021 ?----------------------------------------- ?Discussed with Dr. Creig Hines at shift change who will add lab orders and speak with hospitalist if she is unable to tolerate weight bearing. ? ?  ? ? ?FINAL CLINICAL IMPRESSION(S) / ED DIAGNOSES  ? 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 ? ? ?Rx / DC Orders  ? ?ED Discharge Orders   ? ?      Ordered  ?  traMADol (ULTRAM) 50 MG tablet  Every 8 hours PRN       ? 12/06/21 1518  ? ?  ?  ? ?  ? ? ? ?Note:  This document was prepared using Dragon voice recognition software and may include unintentional dictation errors. ?  ?Victorino Dike, FNP ?12/06/21 1520 ? ?  ?Merlyn Lot, MD ?12/06/21 1524 ? ?

## 2021-12-08 ENCOUNTER — Other Ambulatory Visit: Payer: Self-pay | Admitting: Surgery

## 2021-12-09 ENCOUNTER — Other Ambulatory Visit: Payer: Medicare PPO

## 2021-12-09 ENCOUNTER — Encounter
Admission: RE | Admit: 2021-12-09 | Discharge: 2021-12-09 | Disposition: A | Discharge status: Home / Self Care | Payer: Medicare PPO | Source: Ambulatory Visit | Attending: Surgery | Admitting: Surgery

## 2021-12-09 ENCOUNTER — Inpatient Hospital Stay: Admission: RE | Admit: 2021-12-09 | Payer: Medicare PPO | Source: Ambulatory Visit

## 2021-12-09 VITALS — BP 131/81 | HR 67 | Resp 16 | Ht 61.0 in | Wt 103.0 lb

## 2021-12-09 DIAGNOSIS — Z01818 Encounter for other preprocedural examination: Secondary | ICD-10-CM

## 2021-12-09 HISTORY — DX: Unspecified asthma, uncomplicated: J45.909

## 2021-12-09 HISTORY — DX: Other complications of anesthesia, initial encounter: T88.59XA

## 2021-12-09 HISTORY — DX: Unspecified atrial fibrillation: I48.91

## 2021-12-09 HISTORY — DX: Atherosclerosis of aorta: I70.0

## 2021-12-09 HISTORY — DX: Depression, unspecified: F32.A

## 2021-12-09 HISTORY — DX: Unspecified macular degeneration: H35.30

## 2021-12-09 HISTORY — DX: Chronic obstructive pulmonary disease, unspecified: J44.9

## 2021-12-09 LAB — SURGICAL PCR SCREEN
MRSA, PCR: NEGATIVE
Staphylococcus aureus: NEGATIVE

## 2021-12-09 LAB — TYPE AND SCREEN
ABO/RH(D): A POS
Antibody Screen: NEGATIVE

## 2021-12-09 LAB — URINALYSIS, ROUTINE W REFLEX MICROSCOPIC
Bilirubin Urine: NEGATIVE
Glucose, UA: NEGATIVE mg/dL
Hgb urine dipstick: NEGATIVE
Ketones, ur: 5 mg/dL — AB
Leukocytes,Ua: NEGATIVE
Nitrite: NEGATIVE
Protein, ur: NEGATIVE mg/dL
Specific Gravity, Urine: 1.026 (ref 1.005–1.030)
pH: 5 (ref 5.0–8.0)

## 2021-12-09 NOTE — Patient Instructions (Addendum)
Your procedure is scheduled on:12-10-21 Thursday ?Report to the Registration Desk on the 1st floor of the [facility].Then proceed to the 2nd floor Surgery Desk  ?To find out your arrival time, please call [phone] between 1PM - 3PM on:12-09-21 Wednesday ?If your arrival time is 6:00 am, do not arrive prior to that time as the [facility] entrance doors do not open until 6:00 am. ? ?REMEMBER: ?Instructions that are not followed completely may result in serious medical risk, up to and including death; or upon the discretion of your surgeon and anesthesiologist your surgery may need to be rescheduled. ? ?Do not eat food after midnight the night before surgery.  ?No gum chewing, lozengers or hard candies. ? ?You may however, drink CLEAR liquids up to 2 hours before you are scheduled to arrive for your surgery. Do not drink anything within 2 hours of your scheduled arrival time. ? ?Clear liquids include: ?- water  ?- apple juice without pulp ?- gatorade (not RED colors) ?- black coffee or tea (Do NOT add milk or creamers to the coffee or tea) ?Do NOT drink anything that is not on this list. ? ?In addition, your doctor has ordered for you to drink the provided  ?Ensure Pre-Surgery Clear Carbohydrate Drink  ?Drinking this carbohydrate drink up to two hours before surgery helps to reduce insulin resistance and improve patient outcomes. Please complete drinking 2 hours prior to scheduled arrival time. ? ?TAKE THESE MEDICATIONS THE MORNING OF SURGERY WITH A SIP OF WATER: ?-diltiazem (CARDIZEM)  ?-PARoxetine (PAXIL) ?-omeprazole (PRILOSEC)-take one the night before and one on the morning of surgery - helps to prevent nausea after surgery.) ?-You may take Imodium if needed ? ?One week prior to surgery: ?Stop Anti-inflammatories (NSAIDS) such as Advil, Aleve, Ibuprofen, Motrin, Naproxen, Naprosyn and Aspirin based products such as Excedrin, Goodys Powder, BC Powder.You may however, take Tylenol/Tramadol if needed for pain  up until the day of surgery. ? ?Stop ANY OVER THE COUNTER supplements/vitamins NOW (12-09-21) until after surgery  ? ?No Alcohol for 24 hours before or after surgery. ? ?No Smoking including e-cigarettes for 24 hours prior to surgery.  ?No chewable tobacco products for at least 6 hours prior to surgery.  ?No nicotine patches on the day of surgery. ? ?Do not use any "recreational" drugs for at least a week prior to your surgery.  ?Please be advised that the combination of cocaine and anesthesia may have negative outcomes, up to and including death. ?If you test positive for cocaine, your surgery will be cancelled. ? ?On the morning of surgery brush your teeth with toothpaste and water, you may rinse your mouth with mouthwash if you wish. ?Do not swallow any toothpaste or mouthwash. ? ?Use CHG wipes as directed on instruction sheet. ? ?Do not wear jewelry, make-up, hairpins, clips or nail polish. ? ?Do not wear lotions, powders, or perfumes.  ? ?Do not shave body from the neck down 48 hours prior to surgery just in case you cut yourself which could leave a site for infection.  ?Also, freshly shaved skin may become irritated if using the CHG soap. ? ?Contact lenses, hearing aids and dentures may not be worn into surgery. ? ?Do not bring valuables to the hospital. [facility] is not responsible for any missing/lost belongings or valuables. ? ?Notify your doctor if there is any change in your medical condition (cold, fever, infection). ? ?Wear comfortable clothing (specific to your surgery type) to the hospital. ? ?After surgery, you  can help prevent lung complications by doing breathing exercises.  ?Take deep breaths and cough every 1-2 hours. Your doctor may order a device called an Incentive Spirometer to help you take deep breaths. ?When coughing or sneezing, hold a pillow firmly against your incision with both hands. This is called ?splinting.? Doing this helps protect your incision. It also decreases belly  discomfort. ? ?If you are being admitted to the hospital overnight, leave your suitcase in the car. ?After surgery it may be brought to your room. ? ?If you are being discharged the day of surgery, you will not be allowed to drive home. ?You will need a responsible adult (18 years or older) to drive you home and stay with you that night.  ? ?If you are taking public transportation, you will need to have a responsible adult (18 years or older) with you. ?Please confirm with your physician that it is acceptable to use public transportation.  ? ?Please call the [place] [place] Dept. at [phone] if you have any questions about these instructions. ? ?Surgery Visitation Policy: ? ?Patients undergoing a surgery or procedure may have two family members or support persons with them as long as the person is not COVID-19 positive or experiencing its symptoms.  ? ?Inpatient Visitation:   ? ?Visiting hours are 7 a.m. to 8 p.m. ?Up to four visitors are allowed at one time in a patient room, including children. The visitors may rotate out with other people during the day. One designated support person (adult) may remain overnight.  ?

## 2021-12-09 NOTE — Patient Instructions (Signed)
Your procedure is scheduled on:12-10-21 Thursday ?Report to the Registration Desk on the 1st floor of the [facility].Then proceed to the 2nd floor Surgery Desk ?To find out your arrival time, please call [phone] between 1PM - 3PM on:12-09-21- Wednesday ?If your arrival time is 6:00 am, do not arrive prior to that time as the [facility] entrance doors do not open until 6:00 am. ? ?REMEMBER: ?Instructions that are not followed completely may result in serious medical risk, up to and including death; or upon the discretion of your surgeon and anesthesiologist your surgery may need to be rescheduled. ? ?Do not eat food after midnight the night before surgery.  ?No gum chewing, lozengers or hard candies. ? ?You may however, drink CLEAR liquids up to 2 hours before you are scheduled to arrive for your surgery. Do not drink anything within 2 hours of your scheduled arrival time. ? ?Clear liquids include: ?- water  ?- apple juice without pulp ?- gatorade (not RED colors) ?- black coffee or tea (Do NOT add milk or creamers to the coffee or tea) ?Do NOT drink anything that is not on this list. ? ?Type 1 and Type 2 diabetics should only drink water. ? ?In addition, your doctor has ordered for you to drink the provided  ?Ensure Pre-Surgery Clear Carbohydrate Drink  ?Gatorade G2 ?Drinking this carbohydrate drink up to two hours before surgery helps to reduce insulin resistance and improve patient outcomes. Please complete drinking 2 hours prior to scheduled arrival time. ? ?TAKE THESE MEDICATIONS THE MORNING OF SURGERY WITH A SIP OF WATER: ? ?(take one the night before and one on the morning of surgery - helps to prevent nausea after surgery.) ? ?Use inhalers on the day of surgery and bring to the hospital. ? ?**Follow new guidelines for insulin and diabetes medications.** ? ?Follow recommendations from Cardiologist, Pulmonologist or PCP regarding stopping Aspirin, Coumadin, Plavix, Eliquis, Pradaxa, or Pletal. ? ?One  week prior to surgery: ?Stop Anti-inflammatories (NSAIDS) such as Advil, Aleve, Ibuprofen, Motrin, Naproxen, Naprosyn and Aspirin based products such as Excedrin, Goodys Powder, BC Powder. ?Stop ANY OVER THE COUNTER supplements until after surgery. ?You may however, continue to take Tylenol if needed for pain up until the day of surgery. ? ?No Alcohol for 24 hours before or after surgery. ? ?No Smoking including e-cigarettes for 24 hours prior to surgery.  ?No chewable tobacco products for at least 6 hours prior to surgery.  ?No nicotine patches on the day of surgery. ? ?Do not use any "recreational" drugs for at least a week prior to your surgery.  ?Please be advised that the combination of cocaine and anesthesia may have negative outcomes, up to and including death. ?If you test positive for cocaine, your surgery will be cancelled. ? ?On the morning of surgery brush your teeth with toothpaste and water, you may rinse your mouth with mouthwash if you wish. ?Do not swallow any toothpaste or mouthwash. ? ?Use CHG Soap or wipes as directed on instruction sheet. ? ?Do not wear jewelry, make-up, hairpins, clips or nail polish. ? ?Do not wear lotions, powders, or perfumes.  ? ?Do not shave body from the neck down 48 hours prior to surgery just in case you cut yourself which could leave a site for infection.  ?Also, freshly shaved skin may become irritated if using the CHG soap. ? ?Contact lenses, hearing aids and dentures may not be worn into surgery. ? ?Do not bring valuables to the hospital. [facility]  is not responsible for any missing/lost belongings or valuables.  ? ?Total Shoulder Arthroplasty:  use Benzolyl Peroxide 5% Gel as directed on instruction sheet. ? ?Fleets enema or bowel prep as directed. ? ?Bring your C-PAP to the hospital with you in case you may have to spend the night.  ? ?Notify your doctor if there is any change in your medical condition (cold, fever, infection). ? ?Wear comfortable clothing  (specific to your surgery type) to the hospital. ? ?After surgery, you can help prevent lung complications by doing breathing exercises.  ?Take deep breaths and cough every 1-2 hours. Your doctor may order a device called an Incentive Spirometer to help you take deep breaths. ?When coughing or sneezing, hold a pillow firmly against your incision with both hands. This is called ?splinting.? Doing this helps protect your incision. It also decreases belly discomfort. ? ?If you are being admitted to the hospital overnight, leave your suitcase in the car. ?After surgery it may be brought to your room. ? ?If you are being discharged the day of surgery, you will not be allowed to drive home. ?You will need a responsible adult (18 years or older) to drive you home and stay with you that night.  ? ?If you are taking public transportation, you will need to have a responsible adult (18 years or older) with you. ?Please confirm with your physician that it is acceptable to use public transportation.  ? ?Please call the [place] Dept. at [phone] if you have any questions about these instructions. ? ?Surgery Visitation Policy: ? ?Patients undergoing a surgery or procedure may have two family members or support persons with them as long as the person is not COVID-19 positive or experiencing its symptoms.  ? ?Inpatient Visitation:   ? ?Visiting hours are 7 a.m. to 8 p.m. ?Up to four visitors are allowed at one time in a patient room, including children. The visitors may rotate out with other people during the day. One designated support person (adult) may remain overnight.  ?

## 2021-12-10 ENCOUNTER — Encounter
Admission: RE | Disposition: A | Discharge status: Home / Self Care | Payer: Self-pay | Source: Home / Self Care | Attending: Surgery

## 2021-12-10 ENCOUNTER — Inpatient Hospital Stay: Payer: Medicare PPO | Admitting: Anesthesiology

## 2021-12-10 ENCOUNTER — Encounter: Payer: Self-pay | Admitting: Surgery

## 2021-12-10 ENCOUNTER — Inpatient Hospital Stay: Payer: Medicare PPO

## 2021-12-10 ENCOUNTER — Inpatient Hospital Stay
Admission: RE | Admit: 2021-12-10 | Discharge: 2021-12-12 | DRG: 482 | Disposition: A | Discharge status: Home Health | Payer: Medicare PPO | Attending: Surgery | Admitting: Surgery

## 2021-12-10 ENCOUNTER — Other Ambulatory Visit: Payer: Self-pay

## 2021-12-10 DIAGNOSIS — I10 Essential (primary) hypertension: Secondary | ICD-10-CM | POA: Diagnosis present

## 2021-12-10 DIAGNOSIS — M810 Age-related osteoporosis without current pathological fracture: Secondary | ICD-10-CM | POA: Diagnosis present

## 2021-12-10 DIAGNOSIS — M1990 Unspecified osteoarthritis, unspecified site: Secondary | ICD-10-CM | POA: Diagnosis present

## 2021-12-10 DIAGNOSIS — F419 Anxiety disorder, unspecified: Secondary | ICD-10-CM | POA: Diagnosis present

## 2021-12-10 DIAGNOSIS — E119 Type 2 diabetes mellitus without complications: Secondary | ICD-10-CM | POA: Diagnosis present

## 2021-12-10 DIAGNOSIS — S72112A Displaced fracture of greater trochanter of left femur, initial encounter for closed fracture: Secondary | ICD-10-CM | POA: Diagnosis present

## 2021-12-10 DIAGNOSIS — Y92019 Unspecified place in single-family (private) house as the place of occurrence of the external cause: Secondary | ICD-10-CM | POA: Diagnosis not present

## 2021-12-10 DIAGNOSIS — J45909 Unspecified asthma, uncomplicated: Secondary | ICD-10-CM | POA: Diagnosis not present

## 2021-12-10 DIAGNOSIS — S199XXA Unspecified injury of neck, initial encounter: Secondary | ICD-10-CM | POA: Diagnosis present

## 2021-12-10 DIAGNOSIS — Z79899 Other long term (current) drug therapy: Secondary | ICD-10-CM

## 2021-12-10 DIAGNOSIS — Z833 Family history of diabetes mellitus: Secondary | ICD-10-CM | POA: Diagnosis not present

## 2021-12-10 DIAGNOSIS — Z96642 Presence of left artificial hip joint: Secondary | ICD-10-CM | POA: Diagnosis present

## 2021-12-10 DIAGNOSIS — E1151 Type 2 diabetes mellitus with diabetic peripheral angiopathy without gangrene: Secondary | ICD-10-CM | POA: Diagnosis present

## 2021-12-10 DIAGNOSIS — W1830XA Fall on same level, unspecified, initial encounter: Secondary | ICD-10-CM | POA: Diagnosis present

## 2021-12-10 DIAGNOSIS — Z7901 Long term (current) use of anticoagulants: Secondary | ICD-10-CM | POA: Diagnosis not present

## 2021-12-10 DIAGNOSIS — Z807 Family history of other malignant neoplasms of lymphoid, hematopoietic and related tissues: Secondary | ICD-10-CM | POA: Diagnosis not present

## 2021-12-10 DIAGNOSIS — E1169 Type 2 diabetes mellitus with other specified complication: Principal | ICD-10-CM

## 2021-12-10 DIAGNOSIS — Z808 Family history of malignant neoplasm of other organs or systems: Secondary | ICD-10-CM

## 2021-12-10 DIAGNOSIS — I700 Atherosclerosis of aorta: Secondary | ICD-10-CM | POA: Diagnosis present

## 2021-12-10 DIAGNOSIS — J449 Chronic obstructive pulmonary disease, unspecified: Secondary | ICD-10-CM | POA: Diagnosis present

## 2021-12-10 DIAGNOSIS — Z8249 Family history of ischemic heart disease and other diseases of the circulatory system: Secondary | ICD-10-CM | POA: Diagnosis not present

## 2021-12-10 DIAGNOSIS — S72109A Unspecified trochanteric fracture of unspecified femur, initial encounter for closed fracture: Secondary | ICD-10-CM | POA: Diagnosis present

## 2021-12-10 DIAGNOSIS — E785 Hyperlipidemia, unspecified: Secondary | ICD-10-CM | POA: Diagnosis present

## 2021-12-10 DIAGNOSIS — H409 Unspecified glaucoma: Secondary | ICD-10-CM | POA: Diagnosis present

## 2021-12-10 DIAGNOSIS — F17210 Nicotine dependence, cigarettes, uncomplicated: Secondary | ICD-10-CM | POA: Diagnosis present

## 2021-12-10 DIAGNOSIS — F32A Depression, unspecified: Secondary | ICD-10-CM | POA: Diagnosis present

## 2021-12-10 DIAGNOSIS — I4891 Unspecified atrial fibrillation: Secondary | ICD-10-CM | POA: Diagnosis present

## 2021-12-10 DIAGNOSIS — S161XXA Strain of muscle, fascia and tendon at neck level, initial encounter: Secondary | ICD-10-CM | POA: Diagnosis not present

## 2021-12-10 DIAGNOSIS — K580 Irritable bowel syndrome with diarrhea: Secondary | ICD-10-CM | POA: Diagnosis present

## 2021-12-10 DIAGNOSIS — K219 Gastro-esophageal reflux disease without esophagitis: Secondary | ICD-10-CM | POA: Diagnosis present

## 2021-12-10 DIAGNOSIS — M25552 Pain in left hip: Secondary | ICD-10-CM | POA: Diagnosis not present

## 2021-12-10 DIAGNOSIS — S0990XA Unspecified injury of head, initial encounter: Secondary | ICD-10-CM | POA: Diagnosis not present

## 2021-12-10 HISTORY — PX: ORIF PERIPROSTHETIC FRACTURE: SHX5034

## 2021-12-10 LAB — ABO/RH: ABO/RH(D): A POS

## 2021-12-10 LAB — GLUCOSE, CAPILLARY
Glucose-Capillary: 160 mg/dL — ABNORMAL HIGH (ref 70–99)
Glucose-Capillary: 118 mg/dL — ABNORMAL HIGH (ref 70–99)

## 2021-12-10 SURGERY — OPEN REDUCTION INTERNAL FIXATION (ORIF) PERIPROSTHETIC FRACTURE
Anesthesia: Spinal | Site: Hip | Laterality: Left

## 2021-12-10 MED ORDER — FLUTICASONE PROPIONATE 50 MCG/ACT NA SUSP
1.0000 | Freq: Every day | NASAL | Status: DC | PRN
  Administered 2021-12-10: 1 via NASAL
  Filled 2021-12-10: qty 16

## 2021-12-10 MED ORDER — ACETAMINOPHEN 10 MG/ML IV SOLN
INTRAVENOUS | Status: AC
  Filled 2021-12-10: qty 100

## 2021-12-10 MED ORDER — DOCUSATE SODIUM 100 MG PO CAPS
100.0000 mg | ORAL_CAPSULE | Freq: Two times a day (BID) | ORAL | Status: DC
  Administered 2021-12-10 – 2021-12-12 (×4): 100 mg via ORAL
  Filled 2021-12-10 (×4): qty 1

## 2021-12-10 MED ORDER — PROPOFOL 500 MG/50ML IV EMUL
INTRAVENOUS | Status: DC | PRN
  Administered 2021-12-10: 45 ug/kg/min via INTRAVENOUS

## 2021-12-10 MED ORDER — VITAMIN D 25 MCG (1000 UNIT) PO TABS
2000.0000 [IU] | ORAL_TABLET | Freq: Every morning | ORAL | Status: DC
  Administered 2021-12-11 – 2021-12-12 (×2): 2000 [IU] via ORAL
  Filled 2021-12-10 (×2): qty 2

## 2021-12-10 MED ORDER — NEOMYCIN-POLYMYXIN B GU 40-200000 IR SOLN
Status: AC
  Filled 2021-12-10: qty 20

## 2021-12-10 MED ORDER — BUPIVACAINE HCL (PF) 0.5 % IJ SOLN
INTRAMUSCULAR | Status: AC
  Filled 2021-12-10: qty 30

## 2021-12-10 MED ORDER — BUPIVACAINE LIPOSOME 1.3 % IJ SUSP
INTRAMUSCULAR | Status: AC
  Filled 2021-12-10: qty 20

## 2021-12-10 MED ORDER — SIMVASTATIN 20 MG PO TABS
40.0000 mg | ORAL_TABLET | Freq: Every day | ORAL | Status: DC
  Administered 2021-12-10 – 2021-12-11 (×2): 40 mg via ORAL
  Filled 2021-12-10 (×2): qty 2

## 2021-12-10 MED ORDER — PROPOFOL 10 MG/ML IV BOLUS
INTRAVENOUS | Status: DC | PRN
  Administered 2021-12-10: 40 mg via INTRAVENOUS

## 2021-12-10 MED ORDER — CEFAZOLIN SODIUM-DEXTROSE 2-4 GM/100ML-% IV SOLN
2.0000 g | Freq: Four times a day (QID) | INTRAVENOUS | Status: AC
  Administered 2021-12-10 – 2021-12-11 (×2): 2 g via INTRAVENOUS
  Filled 2021-12-10 (×3): qty 100

## 2021-12-10 MED ORDER — FENTANYL CITRATE (PF) 100 MCG/2ML IJ SOLN: 25.0000 ug | INTRAMUSCULAR | Status: DC | PRN

## 2021-12-10 MED ORDER — ORAL CARE MOUTH RINSE: 15.0000 mL | Freq: Once | OROMUCOSAL | Status: AC

## 2021-12-10 MED ORDER — METOCLOPRAMIDE HCL 5 MG/ML IJ SOLN: 5.0000 mg | Freq: Three times a day (TID) | INTRAMUSCULAR | Status: DC | PRN

## 2021-12-10 MED ORDER — TRAMADOL HCL 50 MG PO TABS
50.0000 mg | ORAL_TABLET | Freq: Four times a day (QID) | ORAL | Status: DC | PRN
  Administered 2021-12-10 – 2021-12-12 (×5): 100 mg via ORAL
  Filled 2021-12-10 (×2): qty 2
  Filled 2021-12-10: qty 1
  Filled 2021-12-10 (×4): qty 2

## 2021-12-10 MED ORDER — ONDANSETRON HCL 4 MG/2ML IJ SOLN: 4.0000 mg | Freq: Four times a day (QID) | INTRAMUSCULAR | Status: DC | PRN

## 2021-12-10 MED ORDER — ENOXAPARIN SODIUM 40 MG/0.4ML IJ SOSY
40.0000 mg | PREFILLED_SYRINGE | INTRAMUSCULAR | Status: DC
  Administered 2021-12-11 – 2021-12-12 (×2): 40 mg via SUBCUTANEOUS
  Filled 2021-12-10 (×2): qty 0.4

## 2021-12-10 MED ORDER — KETOROLAC TROMETHAMINE 15 MG/ML IJ SOLN
15.0000 mg | Freq: Once | INTRAMUSCULAR | Status: AC
  Administered 2021-12-10: 15 mg via INTRAVENOUS
  Filled 2021-12-10: qty 1

## 2021-12-10 MED ORDER — LOPERAMIDE HCL 2 MG PO CAPS
2.0000 mg | ORAL_CAPSULE | ORAL | Status: DC
  Administered 2021-12-11 – 2021-12-12 (×2): 2 mg via ORAL
  Filled 2021-12-10 (×2): qty 1

## 2021-12-10 MED ORDER — BUPIVACAINE-EPINEPHRINE (PF) 0.5% -1:200000 IJ SOLN
INTRAMUSCULAR | Status: AC
  Filled 2021-12-10: qty 30

## 2021-12-10 MED ORDER — BISACODYL 10 MG RE SUPP: 10.0000 mg | Freq: Every day | RECTAL | Status: DC | PRN

## 2021-12-10 MED ORDER — ACETAMINOPHEN 500 MG PO TABS
500.0000 mg | ORAL_TABLET | Freq: Four times a day (QID) | ORAL | Status: AC
  Administered 2021-12-10 – 2021-12-11 (×3): 500 mg via ORAL
  Filled 2021-12-10 (×4): qty 1

## 2021-12-10 MED ORDER — SODIUM CHLORIDE FLUSH 0.9 % IV SOLN
INTRAVENOUS | Status: AC
  Filled 2021-12-10: qty 10

## 2021-12-10 MED ORDER — METOCLOPRAMIDE HCL 5 MG PO TABS
5.0000 mg | ORAL_TABLET | Freq: Three times a day (TID) | ORAL | Status: DC | PRN
  Filled 2021-12-10: qty 2

## 2021-12-10 MED ORDER — DROPERIDOL 2.5 MG/ML IJ SOLN: 0.6250 mg | Freq: Once | INTRAMUSCULAR | Status: DC | PRN

## 2021-12-10 MED ORDER — DIPHENHYDRAMINE HCL 12.5 MG/5ML PO ELIX: 12.5000 mg | ORAL_SOLUTION | ORAL | Status: DC | PRN

## 2021-12-10 MED ORDER — 0.9 % SODIUM CHLORIDE (POUR BTL) OPTIME
TOPICAL | Status: DC | PRN
  Administered 2021-12-10: 1000 mL

## 2021-12-10 MED ORDER — CEFAZOLIN SODIUM-DEXTROSE 2-4 GM/100ML-% IV SOLN
2.0000 g | INTRAVENOUS | Status: AC
  Administered 2021-12-10: 2 g via INTRAVENOUS

## 2021-12-10 MED ORDER — PAROXETINE HCL 20 MG PO TABS
20.0000 mg | ORAL_TABLET | Freq: Every morning | ORAL | Status: DC
Start: 2021-12-11 — End: 2021-12-12
  Administered 2021-12-11 – 2021-12-12 (×2): 20 mg via ORAL
  Filled 2021-12-10 (×2): qty 1

## 2021-12-10 MED ORDER — STERILE WATER FOR IRRIGATION IR SOLN
Status: DC | PRN
  Administered 2021-12-10: 1000 mL

## 2021-12-10 MED ORDER — OCUVITE-LUTEIN PO CAPS
ORAL_CAPSULE | Freq: Every morning | ORAL | Status: DC
Start: 2021-12-11 — End: 2021-12-12
  Administered 2021-12-11 – 2021-12-12 (×2): 1 via ORAL
  Filled 2021-12-10 (×2): qty 1

## 2021-12-10 MED ORDER — FERROUS GLUCONATE 324 (38 FE) MG PO TABS
324.0000 mg | ORAL_TABLET | Freq: Every morning | ORAL | Status: DC
  Administered 2021-12-11 – 2021-12-12 (×2): 324 mg via ORAL
  Filled 2021-12-10 (×2): qty 1

## 2021-12-10 MED ORDER — CHLORHEXIDINE GLUCONATE 0.12 % MT SOLN
OROMUCOSAL | Status: AC
  Administered 2021-12-10: 15 mL via OROMUCOSAL
  Filled 2021-12-10: qty 15

## 2021-12-10 MED ORDER — ACETAMINOPHEN 10 MG/ML IV SOLN
1000.0000 mg | Freq: Once | INTRAVENOUS | Status: DC | PRN
  Administered 2021-12-10: 1000 mg via INTRAVENOUS

## 2021-12-10 MED ORDER — EPHEDRINE SULFATE (PRESSORS) 50 MG/ML IJ SOLN
INTRAMUSCULAR | Status: DC | PRN
  Administered 2021-12-10: 10 mg via INTRAVENOUS

## 2021-12-10 MED ORDER — MAGNESIUM HYDROXIDE 400 MG/5ML PO SUSP: 30.0000 mL | Freq: Every day | ORAL | Status: DC | PRN

## 2021-12-10 MED ORDER — PROMETHAZINE HCL 25 MG/ML IJ SOLN: 6.2500 mg | INTRAMUSCULAR | Status: DC | PRN

## 2021-12-10 MED ORDER — DILTIAZEM HCL 30 MG PO TABS
30.0000 mg | ORAL_TABLET | Freq: Every day | ORAL | Status: DC
  Filled 2021-12-10: qty 1

## 2021-12-10 MED ORDER — KETOROLAC TROMETHAMINE 15 MG/ML IJ SOLN
7.5000 mg | Freq: Four times a day (QID) | INTRAMUSCULAR | Status: DC | PRN
  Administered 2021-12-11: 7.5 mg via INTRAVENOUS
  Filled 2021-12-10: qty 1

## 2021-12-10 MED ORDER — SODIUM CHLORIDE 0.9 % IV SOLN: INTRAVENOUS | Status: DC

## 2021-12-10 MED ORDER — CEFAZOLIN SODIUM-DEXTROSE 2-4 GM/100ML-% IV SOLN
INTRAVENOUS | Status: AC
  Administered 2021-12-10: 2 g via INTRAVENOUS
  Filled 2021-12-10: qty 100

## 2021-12-10 MED ORDER — PANTOPRAZOLE SODIUM 40 MG PO TBEC
40.0000 mg | DELAYED_RELEASE_TABLET | Freq: Every day | ORAL | Status: DC
Start: 2021-12-11 — End: 2021-12-12
  Administered 2021-12-11 – 2021-12-12 (×2): 40 mg via ORAL
  Filled 2021-12-10 (×2): qty 1

## 2021-12-10 MED ORDER — FLEET ENEMA 7-19 GM/118ML RE ENEM: 1.0000 | ENEMA | Freq: Once | RECTAL | Status: DC | PRN

## 2021-12-10 MED ORDER — PHENYLEPHRINE HCL-NACL 20-0.9 MG/250ML-% IV SOLN
INTRAVENOUS | Status: DC | PRN
  Administered 2021-12-10: 50 ug/min via INTRAVENOUS

## 2021-12-10 MED ORDER — CHLORHEXIDINE GLUCONATE 0.12 % MT SOLN: 15.0000 mL | Freq: Once | OROMUCOSAL | Status: AC

## 2021-12-10 MED ORDER — PROPOFOL 1000 MG/100ML IV EMUL
INTRAVENOUS | Status: AC
  Filled 2021-12-10: qty 100

## 2021-12-10 MED ORDER — FENTANYL CITRATE (PF) 100 MCG/2ML IJ SOLN
INTRAMUSCULAR | Status: AC
  Filled 2021-12-10: qty 2

## 2021-12-10 MED ORDER — DIAZEPAM 5 MG PO TABS
5.0000 mg | ORAL_TABLET | Freq: Two times a day (BID) | ORAL | Status: DC | PRN
  Administered 2021-12-10: 5 mg via ORAL
  Filled 2021-12-10: qty 1

## 2021-12-10 MED ORDER — FENTANYL CITRATE (PF) 100 MCG/2ML IJ SOLN
INTRAMUSCULAR | Status: DC | PRN
  Administered 2021-12-10 (×2): 25 ug via INTRAVENOUS

## 2021-12-10 MED ORDER — LACTATED RINGERS IV SOLN: INTRAVENOUS | Status: DC

## 2021-12-10 MED ORDER — ONDANSETRON HCL 4 MG PO TABS
4.0000 mg | ORAL_TABLET | Freq: Four times a day (QID) | ORAL | Status: DC | PRN
Start: 2021-12-10 — End: 2021-12-12

## 2021-12-10 MED ORDER — BRAINSTRONG MEMORY SUPPORT PO TABS
ORAL_TABLET | ORAL | Status: DC
Start: 2021-12-11 — End: 2021-12-10

## 2021-12-10 MED ORDER — SODIUM CHLORIDE (PF) 0.9 % IJ SOLN
INTRAMUSCULAR | Status: DC | PRN
  Administered 2021-12-10: 60 mL via INTRAMUSCULAR

## 2021-12-10 SURGICAL SUPPLY — 39 items
APL PRP STRL LF DISP 70% ISPRP (MISCELLANEOUS) ×1
BNDG COHESIVE 6X5 TAN ST LF (GAUZE/BANDAGES/DRESSINGS) ×2 IMPLANT
BNDG ELASTIC 6X5.8 VLCR STR LF (GAUZE/BANDAGES/DRESSINGS) IMPLANT
BRACE KNEE POST OP SHORT (BRACE) IMPLANT
CABLE INTEGRAL SHORT GTR W/2 (Cable) ×1 IMPLANT
CHLORAPREP W/TINT 26 (MISCELLANEOUS) ×2 IMPLANT
COVER BACK TABLE REUSABLE LG (DRAPES) ×2 IMPLANT
DRAPE C-ARM XRAY 36X54 (DRAPES) ×2 IMPLANT
DRAPE C-ARMOR (DRAPES) ×1 IMPLANT
DRAPE INCISE IOBAN 66X60 STRL (DRAPES) ×4 IMPLANT
DRAPE ORTHO SPLIT 77X108 STRL (DRAPES) ×4
DRAPE SURG ORHT 6 SPLT 77X108 (DRAPES) ×2 IMPLANT
DRAPE U-SHAPE 47X51 STRL (DRAPES) ×2 IMPLANT
DRSG OPSITE POSTOP 4X6 (GAUZE/BANDAGES/DRESSINGS) ×2 IMPLANT
DRSG OPSITE POSTOP 4X8 (GAUZE/BANDAGES/DRESSINGS) ×1 IMPLANT
ELECT REM PT RETURN 9FT ADLT (ELECTROSURGICAL) ×2
ELECTRODE REM PT RTRN 9FT ADLT (ELECTROSURGICAL) ×1 IMPLANT
GAUZE 4X4 16PLY ~~LOC~~+RFID DBL (SPONGE) ×2 IMPLANT
GAUZE SPONGE 4X4 12PLY STRL (GAUZE/BANDAGES/DRESSINGS) ×2 IMPLANT
GAUZE XEROFORM 1X8 LF (GAUZE/BANDAGES/DRESSINGS) ×2 IMPLANT
GLOVE SURG ENC MOIS LTX SZ8 (GLOVE) ×4 IMPLANT
GLOVE SURG UNDER LTX SZ8 (GLOVE) ×2 IMPLANT
GOWN STRL REUS W/ TWL LRG LVL3 (GOWN DISPOSABLE) ×1 IMPLANT
GOWN STRL REUS W/ TWL XL LVL3 (GOWN DISPOSABLE) ×1 IMPLANT
GOWN STRL REUS W/TWL LRG LVL3 (GOWN DISPOSABLE) ×2
GOWN STRL REUS W/TWL XL LVL3 (GOWN DISPOSABLE) ×2
HOLSTER ELECTROSUGICAL PENCIL (MISCELLANEOUS) ×2 IMPLANT
KIT TURNOVER KIT A (KITS) ×2 IMPLANT
MANIFOLD NEPTUNE II (INSTRUMENTS) ×2 IMPLANT
NS IRRIG 1000ML POUR BTL (IV SOLUTION) ×2 IMPLANT
PACK HIP PROSTHESIS (MISCELLANEOUS) ×2 IMPLANT
PUTTY DBX 1CC (Putty) ×2 IMPLANT
PUTTY DBX 1CC DEPUY (Putty) IMPLANT
SPONGE T-LAP 18X18 ~~LOC~~+RFID (SPONGE) ×10 IMPLANT
STAPLER SKIN PROX 35W (STAPLE) ×2 IMPLANT
STOCKINETTE M/LG 89821 (MISCELLANEOUS) ×2 IMPLANT
SUT VIC AB 2-0 CT1 27 (SUTURE) ×4
SUT VIC AB 2-0 CT1 TAPERPNT 27 (SUTURE) ×2 IMPLANT
WATER STERILE IRR 500ML POUR (IV SOLUTION) ×2 IMPLANT

## 2021-12-10 NOTE — Transfer of Care (Signed)
Immediate Anesthesia Transfer of Care Note ? ?Patient: Yolanda Jimenez ? ?Procedure(s) Performed: OPEN REDUCTION INTERNAL FIXATION OF A LEFT GREATER TROCHANTERIC FRACTURE (Left: Hip) ? ?Patient Location: PACU ? ?Anesthesia Type:Spinal ? ?Level of Consciousness: awake, alert  and oriented ? ?Airway & Oxygen Therapy: Patient Spontanous Breathing and Patient connected to face mask oxygen ? ?Post-op Assessment: Report given to RN and Post -op Vital signs reviewed and stable ? ?Post vital signs: Reviewed and stable ? ?Last Vitals:  ?Vitals Value Taken Time  ?BP 145/74 12/10/21 1400  ?Temp    ?Pulse 76 12/10/21 1402  ?Resp 24 12/10/21 1402  ?SpO2 94 % 12/10/21 1402  ?Vitals shown include unvalidated device data. ? ?Last Pain:  ?Vitals:  ? 12/10/21 1033  ?TempSrc: Temporal  ?PainSc: 0-No pain  ?   ? ?  ? ?Complications: No notable events documented. ?

## 2021-12-10 NOTE — Plan of Care (Signed)
Pt admitted to unit from PACU. No signs or symptoms of distress, assessed as per documentation. Will continue to monitor. ?

## 2021-12-10 NOTE — Discharge Instructions (Signed)
Diet: As you were doing prior to hospitalization  ? ?Shower:  May shower but keep the wounds dry, use an occlusive plastic wrap, NO SOAKING IN TUB.  If the bandage gets wet, change with a clean dry gauze. ? ?Dressing:  You may change your dressing as needed. Change the dressing with sterile gauze dressing.   ? ?Activity:  Increase activity slowly as tolerated, but follow the weight bearing instructions below.  No lifting or driving for 6 weeks. ? ?Weight Bearing:   Weight bearing as tolerated to the left leg. Avoid any resisted abduction exercises to the left leg. ? ?To prevent constipation: you may use a stool softener such as - ? ?Colace (over the counter) 100 mg by mouth twice a day  ?Drink plenty of fluids (prune juice may be helpful) and high fiber foods ?Miralax (over the counter) for constipation as needed.   ? ?Itching:  If you experience itching with your medications, try taking only a single pain pill, or even half a pain pill at a time.  You may take up to 10 pain pills per day, and you can also use benadryl over the counter for itching or also to help with sleep.  ? ?Precautions:  If you experience chest pain or shortness of breath - call 911 immediately for transfer to the hospital emergency department!! ? ?If you develop a fever greater that 101 F, purulent drainage from wound, increased redness or drainage from wound, or calf pain-Call [facility]                                              ?Follow- Up Appointment:  Please call for an appointment to be seen in 2 weeks at [facility]  ?

## 2021-12-10 NOTE — Progress Notes (Signed)
PHARMACIST - PHYSICIAN ORDER COMMUNICATION ? ?CONCERNING: P&T Medication Policy on Herbal Medications ? ?DESCRIPTION:  This patient?s order for:  Brainstrong memory support tablets  has been noted. ? ?This product(s) is classified as an ?herbal? or natural product. ?Due to a lack of definitive safety studies or FDA approval, nonstandard manufacturing practices, plus the potential risk of unknown drug-drug interactions while on inpatient medications, the Pharmacy and Therapeutics Committee does not permit the use of ?herbal? or natural products of this type within [facility]. ?  ?ACTION TAKEN: ?The pharmacy department is unable to verify this order at this time and your patient has been informed of this safety policy. ?Please reevaluate patient?s clinical condition at discharge and address if the herbal or natural product(s) should be resumed at that time. ? ?Pearla Dubonnet, PharmD ?Clinical Pharmacist ?12/10/2021 ?3:49 PM ? ?

## 2021-12-10 NOTE — Progress Notes (Signed)
Patient awake/alert x4. Able to move bil lower ext side to side, vague sensation. Indwelling foley catheter patent.. Patient had fallen at home and "twisted" left ankle, noted bruising, swelling, pulses intact, Dr. Roland Rack aware. ?Vitals stable, baseline, afebrile. Tolerated gingerale without event. Son updated.  ?

## 2021-12-10 NOTE — Anesthesia Procedure Notes (Addendum)
Spinal ? ?Patient location during procedure: OR ?Start time: 12/10/2021 12:01 PM ?End time: 12/10/2021 12:07 PM ?Reason for block: surgical anesthesia ?Staffing ?Anesthesiologist: Iran Ouch, MD ?Resident/CRNA: Fredderick Phenix, CRNA ?Preanesthetic Checklist ?Completed: patient identified, IV checked, site marked, risks and benefits discussed, surgical consent, monitors and equipment checked, pre-op evaluation and timeout performed ?Spinal Block ?Patient position: sitting ?Prep: DuraPrep ?Patient monitoring: heart rate, cardiac monitor, continuous pulse ox and blood pressure ?Approach: midline ?Location: L3-4 ?Injection technique: single-shot ?Needle ?Needle type: Sprotte  ?Needle gauge: 24 G ?Needle length: 9 cm ?Assessment ?Sensory level: T4 ?Events: CSF return ? ? ? ?

## 2021-12-10 NOTE — Op Note (Signed)
12/10/2021 ? ?1:54 PM ? ?Patient:   Yolanda Jimenez ? ?Pre-Op Diagnosis:   Displaced left greater trochanteric fracture status post prior left hip hemiarthroplasty. ? ?Post-Op Diagnosis:   Same. ? ?Procedure:   ORIF of displaced left greater trochanteric fracture. ? ?Surgeon:   Pascal Lux, MD ? ?Assistant:   Cameron Proud, PA-C; Myra Rude, PA-S ? ?Anesthesia:   Spinal ? ?Findings:   As above. ? ?Complications:   None ? ?EBL:   125 cc ? ?Fluids:   800 cc crystalloid ? ?UOP:   600 cc ? ?TT:   None ? ?Drains:   None ? ?Closure:   Staples ? ?Implants:   Zimmer-Biomet short greater trochanteric reattachment device with 2 cables. ? ?Brief Clinical Note:   The patient is a 76 year old female who is now 3.5 years status post a left hip hemiarthroplasty from which she has done quite well.  The patient was in her usual state of good functional health until 4 days ago when she apparently lost her balance while getting up from her chair and landed on her left hip.  She presented to the emergency room where x-rays demonstrated the above-noted injury.  The patient presents at this time for definitive management of this injury.  ? ?Procedure:   The patient was brought into the operating room.  After adequate spinal anesthesia was obtained, a Foley catheter was placed before the patient was repositioned in the right lateral decubitus position and secured using a lateral hip positioner.  The left hip and lower extremity were prepped with ChloroPrep solution before being draped sterilely.  Preoperative antibiotics were administered.  A timeout was performed to verify the appropriate surgical site.   ? ?Utilizing the previous incision, a standard posterior approach to the hip was made through an approximately 4-5 inch incision.  The incision was carried down through the subcutaneous tissues to expose the gluteal fascia and proximal end of the iliotibial band.  These structures were split the length of the incision and the  Charnley self-retaining hip retractor placed.  The bursal tissues were swept posteriorly to expose the greater trochanter and the fracture site.  The fracture hematoma was debrided and the adequacy of reduction assessed. ? ?Each of the 2 cables were passed through the appropriate holes in the short greater trochanteric fixation device, then passed around the proximal femur just proximal and distal to the lesser tuberosity respectively using the appropriate cable passers.  The fracture was held in a reduced position and the hooks on the proximal end of the plate were impacted into the greater trochanter.  Each of the 2 cables were then passed through the respective holes and tightened securely before being secured to the plate using the appropriate screwdriver to tighten the locking screws.  The cables were cut short.  The adequacy of fracture reduction and fracture stability was verified by gently rotating the hip and found to be excellent.  One cc of demineralized bone matrix was injected in and around the fracture site to help with healing. ? ?The wound was copiously irrigated with sterile saline solution using bulb irrigation before the peri-incisional and pericapsular tissues were injected with 30 cc of 0.5% Sensorcaine with epinephrine and 20 cc of Exparel diluted out to 60 cc with normal saline to help with postoperative analgesia.  The iliotibial band was reapproximated using #1 Vicryl interrupted sutures before the gluteal fascia was closed using a running #1 Vicryl suture.  The subcutaneous tissues were closed in several layers  using 2-0 Vicryl interrupted sutures before the skin was closed using staples.  A sterile occlusive dressing was applied to the wound.  The patient was then rolled back into the supine position on her hospital bed before being awakened and returned to the recovery room in satisfactory condition after tolerating the procedure well. ?

## 2021-12-10 NOTE — Discharge Summary (Signed)
?Physician Discharge Summary  ?Patient ID: ?Yolanda Jimenez ?MRN: [id number] ?DOB/AGE: [birth year] 76 y.o. ? ?Admit date: 12/10/2021 ?Discharge date: 12/12/2021 ? ?Admission Diagnoses:  ?Trochanteric avulsion fracture of femur ([place]) [S72.109A] ?Displaced left greater trochanteric fracture s/p prior left hip hemiarthroplasty ? ?Discharge Diagnoses: ?Patient Active Problem List  ? Diagnosis Date Noted  ? Trochanteric avulsion fracture of femur ([place]) 12/10/2021  ? Personal history of colonic polyps   ? Polyp of transverse colon   ? Major depressive disorder, recurrent, mild ([place]) 04/08/2020  ? Loss of weight   ? Polyp of ascending colon   ? Moderate protein-calorie malnutrition ([place]) 09/25/2018  ? Tubular adenoma 09/25/2018  ? Adult idiopathic generalized osteoporosis 08/03/2018  ? Aortic atherosclerosis ([place]) 08/03/2018  ? Panlobular emphysema ([place]) 08/03/2018  ? Cellulitis of left hip 06/01/2018  ? Status post hip hemiarthroplasty 05/17/2018  ? Hip fracture ([place]) 05/15/2018  ? Tobacco abuse 04/04/2018  ? DM type 2 with diabetic mixed hyperlipidemia ([place]) 03/08/2018  ? Low serum vitamin D 09/09/2017  ? History of Barrett's esophagus   ? Esophagitis, unspecified   ? PAD (peripheral artery disease) ([place]) 10/15/2016  ? Carotid stenosis 10/15/2016  ? Pain in toe 09/17/2016  ? Renal cyst, acquired, left 09/13/2016  ? Medicare annual wellness visit, initial 09/03/2016  ? 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 Combined fat and carbohydrate induced hyperlipemia 08/29/2014  ? Incomplete emptying of bladder 07/24/2014  ? Increased frequency of urination 03/19/2014  ? Lower abdominal pain 03/02/2014  ? Microscopic hematuria 03/01/2014  ? Nocturia 03/01/2014  ? Urinary tract infection, site not specified 03/01/2014  ? Diarrhea 03/05/2013  ? COLONIC POLYPS, ADENOMATOUS 09/05/2007  ? Hyperlipidemia 09/05/2007  ? IRON DEFICIENCY 09/05/2007  ? BARRETTS ESOPHAGUS 09/05/2007  ? DIVERTICULOSIS, COLON 09/05/2007   ? IRRITABLE BOWEL SYNDROME 09/05/2007  ? ARTERIOVENOUS MALFORMATION 09/05/2007  ? ? ?Past Medical History:  ?Diagnosis Date  ? A-fib ([place])   ? Aortic atherosclerosis ([place])   ? Arthritis   ? back  ? Asthma   ? AVM (arteriovenous malformation)   ? Barrett esophagus   ? C. difficile diarrhea   ? Carotid stenosis   ? Complication of anesthesia   ? very hard to come out of anesthesia and pt states she gets very" loopy" after surgeries  ? COPD (chronic obstructive pulmonary disease) ([place])   ? no inhalers  ? Depression   ? Diabetes mellitus without complication ([place])   ? borderline  ? Diverticulosis   ? GERD (gastroesophageal reflux disease)   ? Heart murmur   ? Hx of adenomatous colonic polyps   ? Hyperlipidemia   ? IBS (irritable bowel syndrome)   ? Insomnia   ? Iron deficiency anemia   ? Kidney cysts   ? hx UTIs  ? Macular degeneration   ? left eye  ? Microscopic hematuria   ? Osteoporosis   ? mild  ? PVD (peripheral vascular disease) ([place])   ? Wears dentures   ? partial lower  ? ?  ?Transfusion: None. ?  ?Consultants (if any):  ? ?Discharged Condition: Improved ? ?Hospital Course: Yolanda Jimenez is an 76 y.o. female who was admitted 12/10/2021 with a diagnosis of a displaced left greater trochanteric fracture s/p prior left hip hemiarthroplasty and went to the operating room on 12/10/2021 and underwent the above named procedures.  ?  ?Surgeries: Procedure(s): ?OPEN REDUCTION INTERNAL FIXATION OF A LEFT GREATER TROCHANTERIC FRACTURE on 12/10/2021 ?Patient tolerated the surgery well. Taken  to PACU where she was stabilized and then transferred to the orthopedic floor. ? ?Started on Lovenox '40mg'$  q 24 hrs. Foot pumps applied bilaterally at 80 mm. Heels elevated on bed with rolled towels. No evidence of DVT. Negative [place]. ?Physical therapy started on day #1 for gait training and transfer. OT started day #1 for ADL and assisted devices. ? ?Patient's IV and foley were both removed on POD1. ? ?Implants: Zimmer-Biomet short greater  trochanteric reattachment device with 2 cables. ? ?She was given perioperative antibiotics:  ?Anti-infectives (From admission, onward)  ? ? Start     Dose/Rate Route Frequency Ordered Stop  ? 12/10/21 1800  ceFAZolin (ANCEF) IVPB 2g/100 mL premix       ? 2 g ?200 mL/hr over 30 Minutes Intravenous Every 6 hours 12/10/21 1532 12/11/21 0726  ? 12/10/21 1015  ceFAZolin (ANCEF) 2-4 GM/100ML-% IVPB       ?Note to Pharmacy: Olena Mater F: cabinet override  ?    12/10/21 1015 12/11/21 0726  ? 12/10/21 0600  ceFAZolin (ANCEF) IVPB 2g/100 mL premix       ? 2 g ?200 mL/hr over 30 Minutes Intravenous On call to O.R. 12/10/21 0026 12/10/21 1218  ? ?  ?. ? ?She was given sequential compression devices, early ambulation, and Lovenox for DVT prophylaxis. ? ?She benefited maximally from the hospital stay and there were no complications.   ? ?Recent vital signs:  ?Vitals:  ? 12/12/21 0353 12/12/21 0854  ?BP: 123/70 129/72  ?Pulse: 64 68  ?Resp: 16 18  ?Temp: 98.5 ?F (36.9 ?C) 98.2 ?F (36.8 ?C)  ?SpO2: 96% 96%  ? ? ?Recent laboratory studies:  ?Lab Results  ?Component Value Date  ? HGB 9.2 (L) 12/12/2021  ? HGB 9.8 (L) 12/11/2021  ? HGB 11.9 (L) 05/18/2018  ? ?Lab Results  ?Component Value Date  ? WBC 8.1 12/12/2021  ? PLT 287 12/12/2021  ? ?No results found for: INR ?Lab Results  ?Component Value Date  ? NA 137 12/11/2021  ? K 3.5 12/11/2021  ? CL 106 12/11/2021  ? CO2 22 12/11/2021  ? BUN 9 12/11/2021  ? CREATININE 0.65 12/11/2021  ? GLUCOSE 112 (H) 12/11/2021  ? ? ?Discharge Medications:   ?Allergies as of 12/12/2021   ? ?   Reactions  ? Codeine   ? "felt drunk, crazy"  ? Hydrocodone-acetaminophen Other (See Comments)  ? Confusion/delirium  ? Lidocaine   ? Heart racing, increased BP - from local at dentist (likely epi)  ? Nsaids Other (See Comments)  ? Avoids due to IBS symptoms (bleeding symptoms)  ? Oxycodone   ? Confusion/delirium  ? ?  ? ?  ?Medication List  ?  ? ?TAKE these medications   ? ?acetaminophen 500 MG  tablet ?Commonly known as: TYLENOL ?Take 1,000 mg by mouth every 6 (six) hours as needed. ?  ?BRAINSTRONG MEMORY SUPPORT PO ?Take 2 capsules by mouth 3 (three) times a week. [place] ?  ?CALCIUM 600+D3 PO ?Take 1 tablet by mouth in the morning. ?  ?Cholecalciferol 50 MCG (2000 UT) Caps ?Take 2,000 Units by mouth in the morning. ?  ?CRANBERRY-VITAMIN C PO ?Take 1 tablet by mouth 3 (three) times a week. ?  ?diazepam 5 MG tablet ?Commonly known as: VALIUM ?Take 1 tablet (5 mg total) by mouth daily as needed for anxiety. ?What changed: when to take this ?  ?diltiazem 30 MG tablet ?Commonly known as: CARDIZEM ?Take 30  mg by mouth daily before breakfast. ?  ?enoxaparin 40 MG/0.4ML injection ?Commonly known as: LOVENOX ?Inject 0.4 mLs (40 mg total) into the skin daily. ?  ?estradiol 0.1 MG/GM vaginal cream ?Commonly known as: ESTRACE ?Estrogen Cream Instruction Discard applicator Apply pea sized amount to tip of finger to urethra before bed. Wash hands well after application. Use Monday, Wednesday and Friday ?  ?ferrous gluconate 240 (27 FE) MG tablet ?Commonly known as: FERGON ?Take 240 mg by mouth in the morning. ?  ?fluticasone 50 MCG/ACT nasal spray ?Commonly known as: FLONASE ?Place 1 spray into both nostrils daily as needed for allergies or rhinitis. ?  ?loperamide 2 MG capsule ?Commonly known as: IMODIUM ?Take 2 mg by mouth every morning. ?  ?omeprazole 40 MG capsule ?Commonly known as: PRILOSEC ?Take 40 mg by mouth daily before breakfast. ?  ?ondansetron 4 MG tablet ?Commonly known as: ZOFRAN ?Take 1 tablet (4 mg total) by mouth every 6 (six) hours as needed for nausea. ?  ?PARoxetine 20 MG tablet ?Commonly known as: PAXIL ?Take 20 mg by mouth in the morning. ?  ?PRESERVISION AREDS 2 PO ?Take 2 tablets by mouth in the morning. ?  ?simvastatin 40 MG tablet ?Commonly known as: ZOCOR ?Take 40 mg by mouth at bedtime. ?  ?STRESS B COMPLEX PO ?Take 1 capsule by mouth in the  morning. ?  ?traMADol 50 MG tablet ?Commonly known as: ULTRAM ?Take 1-2 tablets (50-100 mg total) by mouth every 6 (six) hours as needed for moderate pain or severe pain. ?What changed:  ?how much to take ?when to take

## 2021-12-10 NOTE — Plan of Care (Signed)
?  Problem: Education: ?Goal: Knowledge of the prescribed therapeutic regimen will improve ?Outcome: Progressing ?  ?Problem: Activity: ?Goal: Ability to avoid complications of mobility impairment will improve ?Outcome: Progressing ?  ?Problem: Activity: ?Goal: Ability to tolerate increased activity will improve ?Outcome: Progressing ?  ?Problem: Pain Management: ?Goal: Pain level will decrease with appropriate interventions ?Outcome: Progressing ?  ?

## 2021-12-10 NOTE — H&P (Signed)
History of Present Illness:  ?Yolanda Jimenez is a 76 y.o. female who presents for evaluation and treatment of her lateral sided left hip pain. The patient is now 3.5 years status post left hip hemiarthroplasty for displaced left femoral neck fracture. The patient notes that she has been doing quite well since the surgery and has resumed all of her normal daily activities without difficulty. Apparently, while getting up from her chair yesterday morning, she somehow stumbled ("I think I rolled my ankle"), causing her to lose her balance and landed hard on her left hip. She was brought to the emergency room where x-rays demonstrated a displaced fracture of the left greater trochanter without apparent loosening of the underlying hemiarthroplasty. The patient was able to demonstrate the ability to ambulate with her walker so she was discharged home. The patient notes that she also struck her knee and her ankle. She has some swelling in her ankle but x-rays of the knee and ankle were negative in the emergency room. She denies striking her head or losing consciousness. ? ?Current Outpatient Medications: ? acetaminophen (TYLENOL) 500 MG tablet Take 500 mg by mouth Take 500 mg by mouth every 6 (six) hours as needed.  ? ascorbic acid, vitamin C, (VITAMIN C) 1000 MG tablet Take 1,000 mg by mouth once daily  ? cholecalciferol (VITAMIN D3) 2,000 unit tablet Take 2,000 Units by mouth once daily  ? cyanocobalamin (VITAMIN B12) 500 MCG tablet Take by mouth once daily  ? diazePAM (VALIUM) 5 MG tablet Take 1 tablet (5 mg total) by mouth 2 (two) times daily as needed for Anxiety 60 tablet 5  ? dilTIAZem (CARDIZEM) 30 MG tablet Take 1 tablet (30 mg total) by mouth every morning before breakfast 90 tablet 3  ? estradioL (ESTRACE) 0.01 % (0.1 mg/gram) vaginal cream 3 days a week.  ? ferrous gluconate (FERGON) 240 (27 FE) MG tablet Take 1 tablet (240 mg total) by mouth once daily  ? fluticasone propionate (FLONASE) 50 mcg/actuation nasal  spray Place into one nostril  ? omeprazole (PRILOSEC) 40 MG DR capsule Take 1 capsule (40 mg total) by mouth once daily 90 capsule 3  ? PARoxetine (PAXIL) 20 MG tablet Half tab daily for 10 days then 1 tab daily 30 tablet 11  ? simvastatin (ZOCOR) 40 MG tablet TAKE ONE TABLET BY MOUTH AT BEDTIME 90 tablet 3  ? VIT C/E/ZN/COPPR/LUTEIN/ZEAXAN (PRESERVISION AREDS 2 ORAL) Take 2 tablets by mouth once daily.  ? ?Allergies:  ? Lidocaine Other (UNKNOWN-pt states she believes she has had a reaction to lidocaine before)  ? Nsaids Other (States it makes her feel drunk)  ? Oxycodone Unknown  ? Vicodin [Hydrocodone-Acetaminophen] Other (Confusion/delirium)  ? Zoloft [Sertraline] Other (Hot flashes)  ? ?Past Medical History:  ? Abnormal cytology 2015?  ? Anemia 2010 (ongoing)  ? Arthritis  ? Cataract cortical, senile 09/2013 (have had surgery both eyes)  ? COPD (chronic obstructive pulmonary disease) (CMS-HCC)  ? Diabetes mellitus type 2, uncomplicated (CMS-HCC) 4128 (no meds)  ? GERD (gastroesophageal reflux disease) 2010?  ? Glaucoma (increased eye pressure)  ? History of abnormal cervical Pap smear 2015?  ? Hyperlipidemia  ? Hypertension  ? IBS (irritable bowel syndrome)  ? Major depressive disorder, recurrent, mild (CMS-HCC) 04/08/2020  ? Osteoporosis, post-menopausal  ? PVD (peripheral vascular disease) (CMS-HCC)  ?a. Carotid stenosis. b. Femoral and distal aortic disease. c. Prior ischemic right toe. [facility])  ? ?Past Surgical History:  ? TUBAL LIGATION 1985  ? FRACTURE SURGERY  1987 (left wrist)  ? Foot surgery Right 1997 (Right bunion and 2nd toe surgery)  ? Left hip unipolar hemiarthroplasty Left 05/16/2018 (Dr. Roland Rack)  ? CATARACT EXTRACTION Bilateral 10/2013, 11/2013  ? CERVICAL CONE BIOPSY  ? CHOLECYSTECTOMY  ? ?Family History:  ? Myocardial Infarction (Heart attack) Mother  ? High blood pressure (Hypertension) Mother  ? Diabetes type II Mother  ? Multiple myeloma Father  ? Peripheral vascular disease Sister  ? Diabetes type  II Sister  ? Skin cancer Sister (Squamous cell)  ? Diabetes type II Son  ? Hyperlipidemia (Elevated cholesterol) Son  ? High blood pressure (Hypertension) Son  ? Diabetes type II Maternal Grandmother  ? ?Social History:  ? ?Socioeconomic History:  ? Marital status: Single  ?Tobacco Use  ? Smoking status: Light Smoker  ?Packs/day: 1.00  ?Years: 50.00  ?Pack years: 50.00  ?Types: Cigarettes  ? Smokeless tobacco: Never  ?Vaping Use  ? Vaping Use: Never used  ?Substance and Sexual Activity  ? Alcohol use: Yes  ?Alcohol/week: 2.0 standard drinks  ?Types: 2 Glasses of wine per week  ?Comment: 2 or 3 glasses wine per week  ? Drug use: No  ? Sexual activity: Not Currently  ?Partners: Female  ?Birth control/protection: None  ? ?Review of Systems:  ?A comprehensive 14 point ROS was performed, reviewed, and the pertinent orthopaedic findings are documented in the HPI. ? ?Physical Exam: ?Vitals:  ?12/07/21 1154  ?BP: 124/70  ?Weight: 46.3 kg (102 lb)  ?Height: 154.9 cm (_0 )  ?PainSc: 0-No pain  ?PainLoc: Hip  ? ?General/Constitutional: The patient appears to be well-nourished, well-developed, and in no acute distress. ?Neuro/Psych: Normal mood and affect, oriented to person, place and time. ?Eyes: Non-icteric. Pupils are equal, round, and reactive to light, and exhibit synchronous movement. ?ENT: Unremarkable. ?Lymphatic: No palpable adenopathy. ?Respiratory: Lungs clear to auscultation, Normal chest excursion, No wheezes and Non-labored breathing ?Cardiovascular: Regular rate and rhythm. No murmurs. and No edema, swelling or tenderness, except as noted in detailed exam. ?Integumentary: No impressive skin lesions present, except as noted in detailed exam. ?Musculoskeletal: Unremarkable, except as noted in detailed exam. ? ?Left hip exam: ?The patient presents in a wheelchair, so her gait is not assessed on today's visit. Skin inspection of the left hip demonstrates her surgical incision to be well-healed and without evidence  for infection. There is perhaps mild swelling and early ecchymosis over the lateral aspect of the hip, but no erythema, abrasions, or other skin abnormalities are identified. She has moderate tenderness palpation over the lateral aspect of the left hip. She has more severe pain with any attempted active or passive motion of the hip. She is neurovascularly intact to the left lower extremity and foot. ? ?X-rays/MRI/Lab data:  ?Recent x-rays of the pelvis and left hip from the emergency room are available for review and have been reviewed by myself. These films demonstrate a large fracture of the greater trochanteric region of the left hip with no displacement observed on the AP view, but moderate displacement observed on the frog lateral view. The prosthesis itself appears to be in excellent position and without evidence of loosening. The femoral head is concentrically located within the acetabulum. No other acute bony pathology is identified. ? ?Assessment: ? Status post hip hemiarthroplasty  ? Closed displaced fracture of greater trochanter of left femur.  ? ?Plan: ?The treatment options were discussed with the patient and her son. In addition, patient educational materials were provided regarding the diagnosis and treatment options.  The patient feels that she normally is quite active and would like to have the best chance of maintaining this level of activity. Therefore, I have recommended a surgical procedure, specifically an open reduction and internal fixation of the left greater trochanteric fracture. The procedure was discussed with the patient, as were the potential risks (including bleeding, infection, nerve and/or blood vessel injury, persistent or recurrent pain, loosening and/or failure of the components, dislocation, malunion, non-union, need for further surgery, blood clots, strokes, heart attacks and/or arhythmias, pneumonia, etc.) and benefits. The patient states her understanding and wishes to  proceed. All of the patient's questions and concerns were answered. She can call any time with further concerns. She will follow up post-surgery, routine. This office visit took minutes, of which >50% invo

## 2021-12-10 NOTE — Anesthesia Preprocedure Evaluation (Addendum)
Anesthesia Evaluation  ?Patient identified by MRN, date of birth, ID band ?Patient awake ? ? ? ?Reviewed: ?Allergy & Precautions, NPO status , Patient's Chart, lab work & pertinent test results ? ?History of Anesthesia Complications ?Negative for: history of anesthetic complications ? ?Airway ?Mallampati: II ? ?TM Distance: >3 FB ?Neck ROM: Full ? ? ? Dental ?no notable dental hx. ? ?  ?Pulmonary ?neg shortness of breath, Current Smoker and Patient abstained from smoking.,  ?  ?breath sounds clear to auscultation- rhonchi ?(-) wheezing ? ? ? ? ? Cardiovascular ?Exercise Tolerance: Good ?(-) hypertension+ Peripheral Vascular Disease  ?(-) CAD, (-) Past MI, (-) Cardiac Stents and (-) CABG + Valvular Problems/Murmurs (h/o heart murmur)  ?Rhythm:Regular Rate:Normal ?- Systolic murmurs and - Diastolic murmurs ?Carotid stenosis ?Carotid duplex today reveals stable 1 to 39% ICA stenosis bilaterally with minimal disease on each side. ? ?Pt states recent morning feelings of jitter and attributes it to her diltiazem.  ?  ?Neuro/Psych ?neg Seizures PSYCHIATRIC DISORDERS Depression negative neurological ROS ?   ? GI/Hepatic ?Neg liver ROS, GERD  Medicated,  ?Endo/Other  ?diabetes, Well Controlled, Type 2 ? Renal/GU ?Renal disease  ? ?  ?Musculoskeletal ? ?(+) Arthritis ,  ? Abdominal ?(+) - obese,   ?Peds ? Hematology ? ?(+) Blood dyscrasia, anemia ,   ?Anesthesia Other Findings ?Past Medical History: ?No date: Arthritis ?    Comment:  back ?No date: AVM (arteriovenous malformation) ?No date: Barrett esophagus ?No date: C. difficile diarrhea ?No date: Carotid stenosis ?No date: Diabetes mellitus without complication ([place]) ?    Comment:  borderline ?No date: Diverticulosis ?No date: GERD (gastroesophageal reflux disease) ?No date: Heart murmur ?No date: Hx of adenomatous colonic polyps ?No date: Hyperlipidemia ?No date: IBS (irritable bowel syndrome) ?No date: Insomnia ?No date: Iron deficiency  anemia ?No date: Kidney cysts ?    Comment:  hx UTIs ?No date: Microscopic hematuria ?No date: Osteoporosis ?    Comment:  mild ?No date: PVD (peripheral vascular disease) ([place]) ?No date: Wears dentures ?    Comment:  partial lower ? ? Reproductive/Obstetrics ? ?  ? ? ? ? ? ? ? ? ? ? ? ? ? ?  ?  ? ? ? ? ? ? ? ?Anesthesia Physical ? ?Anesthesia Plan ? ?ASA: II ? ?Anesthesia Plan: General/Spinal  ? ?Post-op Pain Management:  Regional for Post-op pain and Ofirmev IV (intra-op)* and Regional block*  ? ?Induction: Intravenous ? ?PONV Risk Score and Plan: 1 and Propofol infusion and Treatment may vary due to age or medical condition ? ?Airway Management Planned: Natural Airway and Nasal Cannula ? ?Additional Equipment:  ? ?Intra-op Plan:  ? ?Post-operative Plan: Extubation in OR ? ?Informed Consent: I have reviewed the patients History and Physical, chart, labs and discussed the procedure including the risks, benefits and alternatives for the proposed anesthesia with the patient or authorized representative who has indicated his/her understanding and acceptance.  ? ? ? ?Dental advisory given ? ?Plan Discussed with: CRNA and Anesthesiologist ? ?Anesthesia Plan Comments:   ? ? ? ? ? ?Anesthesia Quick Evaluation ? ?

## 2021-12-11 ENCOUNTER — Encounter: Payer: Self-pay | Admitting: Surgery

## 2021-12-11 LAB — CBC
HCT: 30.5 % — ABNORMAL LOW (ref 36.0–46.0)
Hemoglobin: 9.8 g/dL — ABNORMAL LOW (ref 12.0–15.0)
MCH: 31 pg (ref 26.0–34.0)
MCHC: 32.1 g/dL (ref 30.0–36.0)
MCV: 96.5 fL (ref 80.0–100.0)
Platelets: 307 10*3/uL (ref 150–400)
RBC: 3.16 MIL/uL — ABNORMAL LOW (ref 3.87–5.11)
RDW: 14.6 % (ref 11.5–15.5)
WBC: 8.8 10*3/uL (ref 4.0–10.5)
nRBC: 0 % (ref 0.0–0.2)

## 2021-12-11 LAB — BASIC METABOLIC PANEL
Anion gap: 9 (ref 5–15)
BUN: 9 mg/dL (ref 8–23)
CO2: 22 mmol/L (ref 22–32)
Calcium: 8 mg/dL — ABNORMAL LOW (ref 8.9–10.3)
Chloride: 106 mmol/L (ref 98–111)
Creatinine, Ser: 0.65 mg/dL (ref 0.44–1.00)
GFR, Estimated: >60 mL/min (ref >60)
Glucose, Bld: 112 mg/dL — ABNORMAL HIGH (ref 70–99)
Potassium: 3.5 mmol/L (ref 3.5–5.1)
Sodium: 137 mmol/L (ref 135–145)

## 2021-12-11 NOTE — Evaluation (Addendum)
Occupational Therapy Evaluation ?Patient Details ?Name: Yolanda Jimenez ?MRN: [id number] ?DOB: 01-31-1946 ?Today's Date: 12/11/2021 ? ? ?History of Present Illness Pt is a 76 y.o. female with history of type 2 diabetes, left hip fracture with replacement, DM, anemia, and PVD who presented to the ED for treatment and evaluation after mechanical, non-syncopal fall at home. Pt diagnosed with displaced left greater trochanteric fracture and is s/p ORIF.  ? ?Clinical Impression ?  ?Chart reviewed to date, pt greeted in chair with son present. Pt reports I in ADL/IADL PTA, amb without AD. Pt present with limitations in strength, activity tolerance, pain all affecting safe and optimal ADL task completion. STS completed with supervision-MIN A, amb to bathroom with closes supervision-CGA with RW, toilet transfer with CGA. Pt would benefit from further OT to address deficits and to provide further education re: use of AE/DME for safe ADL completion. No OT recommended following discharge. OT will continue to follow acutely.  ? ?Of note, pt is not able to amb into bathroom with RW as door is too narrow. She currently requires use of a RW for safe functional mobility.  ?   ? ?Recommendations for follow up therapy are one component of a multi-disciplinary discharge planning process, led by the attending physician.  Recommendations may be updated based on patient status, additional functional criteria and insurance authorization.  ? ?Follow Up Recommendations ? No OT follow up  ?  ?Assistance Recommended at Discharge Intermittent Supervision/Assistance  ?Patient can return home with the following A little help with walking and/or transfers;A little help with bathing/dressing/bathroom;Assistance with cooking/housework;Assist for transportation;Help with stairs or ramp for entrance ? ?  ?Functional Status Assessment ? Patient has had a recent decline in their functional status and demonstrates the ability to make significant improvements  in function in a reasonable and predictable amount of time.  ?Equipment Recommendations ? BSC/3in1  ?  ?Recommendations for Other Services   ? ? ?  ?Precautions / Restrictions Precautions ?Precautions: Fall ?Restrictions ?Weight Bearing Restrictions: Yes ?LLE Weight Bearing: Weight bearing as tolerated  ? ?  ? ?Mobility Bed Mobility ?  ?  ?  ?  ?  ?  ?  ?General bed mobility comments: NT pt in recliner ?  ? ?Transfers ?Overall transfer level: Needs assistance ?Equipment used: Rolling walker (2 wheels) ?Transfers: Sit to/from Stand ?Sit to Stand: Supervision ?  ?  ?  ?  ?  ?  ?  ? ?  ?Balance Overall balance assessment: Needs assistance, History of Falls ?Sitting-balance support: Feet supported ?Sitting balance-Leahy Scale: Good ?  ?  ?Standing balance support: Bilateral upper extremity supported, During functional activity ?Standing balance-Leahy Scale: Fair ?  ?  ?  ?  ?  ?  ?  ?  ?  ?  ?  ?  ?   ? ?ADL either performed or assessed with clinical judgement  ? ?ADL Overall ADL's : Needs assistance/impaired ?  ?  ?Grooming: Wash/dry hands;Standing ?Grooming Details (indicate cue type and reason): sink level with RW ?  ?  ?  ?  ?  ?  ?Lower Body Dressing: Set up;Maximal assistance ?Lower Body Dressing Details (indicate cue type and reason): SET UP R sock, MAX A L sock ?Toilet Transfer: Supervision/safety;Min guard;Rolling walker (2 wheels);BSC/3in1;Ambulation ?  ?  ?  ?  ?  ?Functional mobility during ADLs: Supervision/safety;Min guard;Rolling walker (2 wheels) ?   ? ? ? ?Vision Patient Visual Report: No change from baseline ?   ?   ?  Perception   ?  ?Praxis   ?  ? ?Pertinent Vitals/Pain Pain Assessment ?Pain Assessment: 0-10 ?Pain Score: 5  ?Pain Location: L hip ?Pain Descriptors / Indicators: Sore ?Pain Intervention(s): Limited activity within patient's tolerance, Monitored during session, Repositioned, Premedicated before session  ? ? ? ?Hand Dominance   ?  ?Extremity/Trunk Assessment Upper Extremity  Assessment ?Upper Extremity Assessment: Overall WFL for tasks assessed ?  ?Lower Extremity Assessment ?Lower Extremity Assessment: Generalized weakness;LLE deficits/detail ?LLE: Unable to fully assess due to pain ?  ?  ?  ?Communication Communication ?Communication: No difficulties ?  ?Cognition Arousal/Alertness: Awake/alert ?Behavior During Therapy: [facility] for tasks assessed/performed ?Overall Cognitive Status: Within Functional Limits for tasks assessed ?  ?  ?  ?  ?  ?  ?  ?  ?  ?  ?  ?  ?  ?  ?  ?  ?  ?  ?  ?General Comments    ? ?  ?Exercises Other Exercises ?Other Exercises: edu re: role of OT, role of rehab, use of DME/AE at home for safe ADL completion, falls prevention ?  ?Shoulder Instructions    ? ? ?Home Living Family/patient expects to be discharged to:: Private residence ?Living Arrangements: Alone ?Available Help at Discharge: Family;Friend(s);Available 24 hours/day ?Type of Home: House ?Home Access: Level entry ?  ?  ?Home Layout: One level ?  ?  ?Bathroom Shower/Tub: Tub/shower unit ?  ?  ?  ?  ?Home Equipment: Rolling Walker (2 wheels);pt has toilet rails  ?  ?  ?  ? ?  ?Prior Functioning/Environment Prior Level of Function : Independent/Modified Independent ?  ?  ?  ?  ?  ?  ?Mobility Comments: ind amb community distances, without AD, one fall in the last 6 months which resulted in hospitalization ?ADLs Comments: independent in ADL/IADL ?  ? ?  ?  ?OT Problem List: Decreased activity tolerance;Decreased strength ?  ?   ?OT Treatment/Interventions: Self-care/ADL training;DME and/or AE instruction;Patient/family education;Therapeutic activities  ?  ?OT Goals(Current goals can be found in the care plan section) Acute Rehab OT Goals ?Patient Stated Goal: go home ?OT Goal Formulation: With patient ?Time For Goal Achievement: 12/25/21 ?Potential to Achieve Goals: Good ?ADL Goals ?Pt Will Perform Grooming: with modified independence;standing ?Pt Will Perform Lower Body Dressing: with modified  independence;with adaptive equipment ?Pt Will Transfer to Toilet: with modified independence;ambulating;bedside commode ?Pt Will Perform Toileting - Clothing Manipulation and hygiene: with modified independence;sit to/from stand  ?OT Frequency: Min 2X/week ?  ? ?Co-evaluation   ?  ?  ?  ?  ? ?  ?AM-PAC OT "6 Clicks" Daily Activity     ?Outcome Measure Help from another person eating meals?: None ?Help from another person taking care of personal grooming?: None ?Help from another person toileting, which includes using toliet, bedpan, or urinal?: A Little ?Help from another person bathing (including washing, rinsing, drying)?: A Little ?Help from another person to put on and taking off regular upper body clothing?: A Little ?Help from another person to put on and taking off regular lower body clothing?: A Lot ?6 Click Score: 19 ?  ?End of Session Equipment Utilized During Treatment: Gait belt;Rolling walker (2 wheels) ?Nurse Communication: Mobility status ? ?Activity Tolerance: Patient tolerated treatment well ?Patient left: in chair;with call bell/phone within reach;with chair alarm set;with family/visitor present ? ?OT Visit Diagnosis: Unsteadiness on feet (R26.81);History of falling (Z91.81)  ?              ?  Time: 4709-6283 ?OT Time Calculation (min): 23 min ?Charges:  OT General Charges ?$OT Visit: 1 Visit ?OT Evaluation ?$OT Eval Low Complexity: 1 Low ?OT Treatments ?$Self Care/Home Management : 8-22 mins ? ?Shanon Payor, OTD OTR/L  ?12/11/21, 3:18 PM  ?

## 2021-12-11 NOTE — Progress Notes (Signed)
?  The patient lives at Home with spouse ?The patient  currently has DME RW and 3 in 1 ? ?They have transportation with their spouse ?They can afford their medication ? ?They are set up with [facility] for Home health services ? ? ? ?

## 2021-12-11 NOTE — Progress Notes (Signed)
?  Subjective: ?1 Day Post-Op Procedure(s) (LRB): ?OPEN REDUCTION INTERNAL FIXATION OF A LEFT GREATER TROCHANTERIC FRACTURE (Left) ?Patient reports pain as well-controlled.   ?Patient is well, and has had no acute complaints or problems ?Plan is to go Home after hospital stay. ?Negative for chest pain and shortness of breath ?Fever: no ?Gastrointestinal: negative for nausea and vomiting.   ? ?Objective: ?Vital signs in last 24 hours: ?Temp:  [97.2 ?F (36.2 ?C)-99 ?F (37.2 ?C)] 99 ?F (37.2 ?C) (05/05 0825) ?Pulse Rate:  [57-70] 60 (05/05 0825) ?Resp:  [12-25] 16 (05/05 0825) ?BP: (109-145)/(61-79) 128/68 (05/05 0825) ?SpO2:  [96 %-100 %] 98 % (05/05 0825) ?Weight:  [46.7 kg] 46.7 kg (05/04 1033) ? ?Intake/Output from previous day: ? ?Intake/Output Summary (Last 24 hours) at 12/11/2021 1018 ?Last data filed at 12/11/2021 9509 ?Gross per 24 hour  ?Intake 2871.74 ml  ?Output 1765 ml  ?Net 1106.74 ml  ?  ?Intake/Output this shift: ?No intake/output data recorded. ? ?Labs: ?Recent Labs  ?  12/11/21 ?0615  ?HGB 9.8*  ? ?Recent Labs  ?  12/11/21 ?0615  ?WBC 8.8  ?RBC 3.16*  ?HCT 30.5*  ?PLT 307  ? ?Recent Labs  ?  12/11/21 ?0615  ?NA 137  ?K 3.5  ?CL 106  ?CO2 22  ?BUN 9  ?CREATININE 0.65  ?GLUCOSE 112*  ?CALCIUM 8.0*  ? ?No results for input(s): LABPT, INR in the last 72 hours. ? ? ?EXAM ?General - Patient is Alert, Appropriate, and Oriented ?Extremity - Neurovascular intact ?Dorsiflexion/Plantar flexion intact ?Compartment soft ?Dressing/Incision -clean, dry, no drainage ?Motor Function - intact, moving foot and toes well on exam.  ? ? ?Assessment/Plan: ?1 Day Post-Op Procedure(s) (LRB): ?OPEN REDUCTION INTERNAL FIXATION OF A LEFT GREATER TROCHANTERIC FRACTURE (Left) ?Principal Problem: ?  Trochanteric avulsion fracture of femur ([place]) ? ?Estimated body mass index is 19.46 kg/m? as calculated from the following: ?  Height as of this encounter: '5\' 1"'$  (1.549 m). ?  Weight as of this encounter: 46.7 kg. ?Advance diet ?Up with  therapy ? ?Discharge possible tomorrow pending completion of therapy goals.  ? ? ?DVT Prophylaxis - Lovenox, Ted hose, and SCDs ?Weight-Bearing as tolerated to left leg, but no resisted abduction ? ?Cassell Smiles, PA-C ?[facility] ?12/11/2021, 10:18 AM ? ?

## 2021-12-11 NOTE — Anesthesia Postprocedure Evaluation (Signed)
Anesthesia Post Note ? ?Patient: Yolanda Jimenez ? ?Procedure(s) Performed: OPEN REDUCTION INTERNAL FIXATION OF A LEFT GREATER TROCHANTERIC FRACTURE (Left: Hip) ? ?Patient location during evaluation: PACU ?Anesthesia Type: Combined General/Spinal ?Level of consciousness: awake and alert ?Pain management: pain level controlled ?Vital Signs Assessment: post-procedure vital signs reviewed and stable ?Respiratory status: spontaneous breathing, nonlabored ventilation and respiratory function stable ?Cardiovascular status: blood pressure returned to baseline and stable ?Postop Assessment: no apparent nausea or vomiting ?Anesthetic complications: no ? ? ?No notable events documented. ? ? ?Last Vitals:  ?Vitals:  ? 12/11/21 0325 12/11/21 0825  ?BP: 126/72 128/68  ?Pulse: (!) 57 60  ?Resp: 16 16  ?Temp: 36.8 ?C 37.2 ?C  ?SpO2: 100% 98%  ?  ?Last Pain:  ?Vitals:  ? 12/11/21 0900  ?TempSrc:   ?PainSc: 4   ? ? ?  ?  ?  ?  ?  ?  ? ?[place] Ouch ? ? ? ? ?

## 2021-12-11 NOTE — Evaluation (Signed)
Physical Therapy Evaluation ?Patient Details ?Name: Yolanda Jimenez ?MRN: [id number] ?DOB: 10-03-1945 ?Today's Date: 12/11/2021 ? ?History of Present Illness ? Pt is a 76 y.o. female with history of type 2 diabetes, left hip fracture with replacement, DM, anemia, and PVD who presented to the ED for treatment and evaluation after mechanical, non-syncopal fall at home. Pt diagnosed with displaced left greater trochanteric fracture and is s/p ORIF. ?  ?Clinical Impression ? Pt was pleasant and motivated to participate during the session and put forth good effort throughout. Pt required no physical assistance with transfers or gait and demonstrated good control and stability throughout.  Pt was antalgic with gait but improved with cues for step-to sequencing for pain control.  Pt was able to amb 20 feet before needing to return to sitting secondary to pain.  Pt has a home with level entry and 24/7 supervision between her two sons and friends.  Pt will benefit from HHPT upon discharge to safely address deficits listed in patient problem list for decreased caregiver assistance and eventual return to PLOF. ? ?   ?   ? ?Recommendations for follow up therapy are one component of a multi-disciplinary discharge planning process, led by the attending physician.  Recommendations may be updated based on patient status, additional functional criteria and insurance authorization. ? ?Follow Up Recommendations Home health PT ? ?  ?Assistance Recommended at Discharge Frequent or constant Supervision/Assistance  ?Patient can return home with the following ? A little help with walking and/or transfers;A little help with bathing/dressing/bathroom;Assistance with cooking/housework;Assist for transportation ? ?  ?Equipment Recommendations None recommended by PT  ?Recommendations for Other Services ?    ?  ?Functional Status Assessment Patient has had a recent decline in their functional status and demonstrates the ability to make significant  improvements in function in a reasonable and predictable amount of time.  ? ?  ?Precautions / Restrictions Precautions ?Precautions: Fall ?Restrictions ?Weight Bearing Restrictions: Yes ?LLE Weight Bearing: Weight bearing as tolerated  ? ?  ? ?Mobility ? Bed Mobility ?  ?  ?  ?  ?  ?  ?  ?General bed mobility comments: NT, pt in recliner ?  ? ?Transfers ?Overall transfer level: Needs assistance ?Equipment used: Rolling walker (2 wheels) ?Transfers: Sit to/from Stand ?Sit to Stand: Supervision ?  ?  ?  ?  ?  ?General transfer comment: Good eccentric and concentric control and stability ?  ? ?Ambulation/Gait ?Ambulation/Gait assistance: Min guard ?Gait Distance (Feet): 20 Feet ?Assistive device: Rolling walker (2 wheels) ?Gait Pattern/deviations: Step-to pattern, Antalgic, Decreased stance time - left, Decreased step length - right ?Gait velocity: decreased ?  ?  ?General Gait Details: Slow cadence with step-to pattern but steady without LOB; mod verbal and visual cues given for step-to sequencing for L hip pain control with weight bearing; 180 deg turn training within the RW ? ?Stairs ?  ?  ?  ?  ?  ? ?Wheelchair Mobility ?  ? ?Modified Rankin (Stroke Patients Only) ?  ? ?  ? ?Balance Overall balance assessment: Needs assistance, History of Falls ?Sitting-balance support: Feet supported ?Sitting balance-Leahy Scale: Good ?  ?  ?Standing balance support: Bilateral upper extremity supported, During functional activity ?Standing balance-Leahy Scale: Fair ?  ?  ?  ?  ?  ?  ?  ?  ?  ?  ?  ?  ?   ? ? ? ?Pertinent Vitals/Pain Pain Assessment ?Pain Assessment: 0-10 ?Pain Score: 2  ?Pain Location: L  hip ?Pain Descriptors / Indicators: Sore ?Pain Intervention(s): Repositioned, Premedicated before session, Monitored during session  ? ? ?Home Living Family/patient expects to be discharged to:: Private residence ?Living Arrangements: Alone ?Available Help at Discharge: Family;Friend(s);Available 24 hours/day ?Type of Home:  House ?Home Access: Level entry ?  ?  ?  ?Home Layout: One level ?Home Equipment: Rolling Walker (2 wheels);BSC/3in1 ?   ?  ?Prior Function Prior Level of Function : Independent/Modified Independent ?  ?  ?  ?  ?  ?  ?Mobility Comments: Ind amb community distances without an AD, no other falls in the last 6 months ?ADLs Comments: Ind with ADLs ?  ? ? ?Hand Dominance  ? Dominant Hand: Right ? ?  ?Extremity/Trunk Assessment  ? Upper Extremity Assessment ?Upper Extremity Assessment: Overall WFL for tasks assessed ?  ? ?Lower Extremity Assessment ?Lower Extremity Assessment: Generalized weakness;LLE deficits/detail ?LLE: Unable to fully assess due to pain ?  ? ?   ?Communication  ? Communication: No difficulties  ?Cognition Arousal/Alertness: Awake/alert ?Behavior During Therapy: [facility] for tasks assessed/performed ?Overall Cognitive Status: Within Functional Limits for tasks assessed ?  ?  ?  ?  ?  ?  ?  ?  ?  ?  ?  ?  ?  ?  ?  ?  ?  ?  ?  ? ?  ?General Comments   ? ?  ?Exercises Total Joint Exercises ?Ankle Circles/Pumps: AROM, Strengthening, Both, 5 reps, 10 reps ?Quad Sets: Strengthening, Both, 5 reps, 10 reps ?Gluteal Sets: Strengthening, Both, 5 reps, 10 reps ?Long [employer]: Strengthening, Both, 5 reps, 10 reps ?Knee Flexion: Strengthening, Both, 5 reps, 10 reps ?Marching in Standing: Strengthening, Left, 5 reps, Standing ?Other Exercises ?Other Exercises: HEP education for BLE APs, QS, GS, and LAQs  ? ?Assessment/Plan  ?  ?PT Assessment Patient needs continued PT services  ?PT Problem List Decreased strength;Decreased activity tolerance;Decreased balance;Decreased mobility;Decreased knowledge of use of DME;Pain ? ?   ?  ?PT Treatment Interventions DME instruction;Gait training;Functional mobility training;Therapeutic activities;Therapeutic exercise;Balance training;Patient/family education   ? ?PT Goals (Current goals can be found in the Care Plan section)  ?Acute Rehab PT Goals ?Patient Stated Goal: To walk better  without pain ?PT Goal Formulation: With patient ?Time For Goal Achievement: 12/24/21 ?Potential to Achieve Goals: Good ? ?  ?Frequency BID ?  ? ? ?Co-evaluation   ?  ?  ?  ?  ? ? ?  ?AM-PAC PT "6 Clicks" Mobility  ?Outcome Measure Help needed turning from your back to your side while in a flat bed without using bedrails?: A Little ?Help needed moving from lying on your back to sitting on the side of a flat bed without using bedrails?: A Little ?Help needed moving to and from a bed to a chair (including a wheelchair)?: A Little ?Help needed standing up from a chair using your arms (e.g., wheelchair or bedside chair)?: A Little ?Help needed to walk in hospital room?: A Little ?Help needed climbing 3-5 steps with a railing? : A Lot ?6 Click Score: 17 ? ?  ?End of Session Equipment Utilized During Treatment: Gait belt ?Activity Tolerance: Patient tolerated treatment well ?Patient left: in chair;with call bell/phone within reach;with chair alarm set ?Nurse Communication: Mobility status;Weight bearing status ?PT Visit Diagnosis: History of falling (Z91.81);Other abnormalities of gait and mobility (R26.89);Muscle weakness (generalized) (M62.81);Pain ?Pain - Right/Left: Left ?Pain - part of body: Hip ?  ? ?Time: 5638-7564 ?PT Time Calculation (min) (ACUTE ONLY): 35  min ? ? ?Charges:   PT Evaluation ?$PT Eval Moderate Complexity: 1 Mod ?PT Treatments ?$Gait Training: 8-22 mins ?  ?   ? ?D. Royetta Asal PT, DPT ?12/11/21, 11:27 AM ? ? ?

## 2021-12-11 NOTE — Progress Notes (Signed)
Physical Therapy Treatment ?Patient Details ?Name: Yolanda Jimenez ?MRN: [id number] ?DOB: 1946-04-27 ?Today's Date: 12/11/2021 ? ? ?History of Present Illness Pt is a 76 y.o. female with history of type 2 diabetes, left hip fracture with replacement, DM, anemia, and PVD who presented to the ED for treatment and evaluation after mechanical, non-syncopal fall at home. Pt diagnosed with displaced left greater trochanteric fracture and is s/p ORIF. ? ?  ?PT Comments  ? ? Pt was pleasant and motivated to participate during the session and put forth good effort throughout. Pt able to stand without physical assist but required cuing for hand placement with pt unable to stand with hands on RW.  Pt antalgic with gait but remained steady without LOB or adverse symptoms other than mild to mod L hip pain. Pt will benefit from HHPT upon discharge to safely address deficits listed in patient problem list for decreased caregiver assistance and eventual return to PLOF. ? ?   ?Recommendations for follow up therapy are one component of a multi-disciplinary discharge planning process, led by the attending physician.  Recommendations may be updated based on patient status, additional functional criteria and insurance authorization. ? ?Follow Up Recommendations ? Home health PT ?  ?  ?Assistance Recommended at Discharge Frequent or constant Supervision/Assistance  ?Patient can return home with the following A little help with walking and/or transfers;A little help with bathing/dressing/bathroom;Assistance with cooking/housework;Assist for transportation ?  ?Equipment Recommendations ? None recommended by PT  ?  ?Recommendations for Other Services   ? ? ?  ?Precautions / Restrictions Precautions ?Precautions: Fall ?Restrictions ?Weight Bearing Restrictions: Yes ?LLE Weight Bearing: Weight bearing as tolerated  ?  ? ?Mobility ? Bed Mobility ?  ?  ?  ?  ?  ?  ?  ?General bed mobility comments: NT, pt in recliner ?  ? ?Transfers ?Overall  transfer level: Needs assistance ?Equipment used: Rolling walker (2 wheels) ?Transfers: Sit to/from Stand ?Sit to Stand: Supervision ?  ?  ?  ?  ?  ?General transfer comment: Good eccentric and concentric control and stability with min verbal cues for hand placement ?  ? ?Ambulation/Gait ?Ambulation/Gait assistance: Min guard ?Gait Distance (Feet): 25 Feet x 1, 20 Feet x 1 ?Assistive device: Rolling walker (2 wheels) ?Gait Pattern/deviations: Step-to pattern, Antalgic, Decreased stance time - left, Decreased step length - right ?Gait velocity: decreased ?  ?  ?General Gait Details: Slow cadence with step-to pattern but steady without LOB; mod verbal and visual cues given for step-to sequencing for L hip pain control with weight bearing ? ? ?Stairs ?  ?  ?  ?  ?  ? ? ?Wheelchair Mobility ?  ? ?Modified Rankin (Stroke Patients Only) ?  ? ? ?  ?Balance Overall balance assessment: Needs assistance, History of Falls ?Sitting-balance support: Feet supported ?Sitting balance-Leahy Scale: Good ?  ?  ?Standing balance support: Bilateral upper extremity supported, During functional activity ?Standing balance-Leahy Scale: Fair ?  ?  ?  ?  ?  ?  ?  ?  ?  ?  ?  ?  ?  ? ?  ?Cognition Arousal/Alertness: Awake/alert ?Behavior During Therapy: [facility] for tasks assessed/performed ?Overall Cognitive Status: Within Functional Limits for tasks assessed ?  ?  ?  ?  ?  ?  ?  ?  ?  ?  ?  ?  ?  ?  ?  ?  ?  ?  ?  ? ?  ?Exercises Total Joint Exercises ?Ankle  Circles/Pumps: AROM, Strengthening, Both, 5 reps, 10 reps (with manual resistance) ?Quad Sets: Strengthening, Both, 5 reps, 10 reps ?Gluteal Sets: Strengthening, Both, 5 reps, 10 reps ?Long [employer]: Strengthening, Both, 10 reps, 15 reps ?Knee Flexion: Strengthening, Both, 10 reps, 15 reps ?Marching in Standing: Strengthening, Left, 5 reps, Standing ?Other Exercises ?Other Exercises: HEP education for BLE APs, QS, GS, and LAQs ? ?  ?General Comments   ?  ?  ? ?Pertinent Vitals/Pain Pain  Assessment ?Pain Assessment: 0-10 ?Pain Score: 3  ?Pain Location: L hip ?Pain Descriptors / Indicators: Sore ?Pain Intervention(s): Repositioned, Premedicated before session, Monitored during session  ? ? ?Home Living Family/patient expects to be discharged to:: Private residence ?Living Arrangements: Alone ?Available Help at Discharge: Family;Friend(s);Available 24 hours/day ?Type of Home: House ?Home Access: Level entry ?  ?  ?  ?Home Layout: One level ?Home Equipment: Conservation officer, nature (2 wheels) ?Additional Comments: pt has toilet rails  ?  ?Prior Function    ?  ?  ?   ? ?PT Goals (current goals can now be found in the care plan section) Progress towards PT goals: Progressing toward goals ? ?  ?Frequency ? ? ? BID ? ? ? ?  ?PT Plan Current plan remains appropriate  ? ? ?Co-evaluation   ?  ?  ?  ?  ? ?  ?AM-PAC PT "6 Clicks" Mobility   ?Outcome Measure ? Help needed turning from your back to your side while in a flat bed without using bedrails?: A Little ?Help needed moving from lying on your back to sitting on the side of a flat bed without using bedrails?: A Little ?Help needed moving to and from a bed to a chair (including a wheelchair)?: A Little ?Help needed standing up from a chair using your arms (e.g., wheelchair or bedside chair)?: A Little ?Help needed to walk in hospital room?: A Little ?Help needed climbing 3-5 steps with a railing? : A Lot ?6 Click Score: 17 ? ?  ?End of Session Equipment Utilized During Treatment: Gait belt ?Activity Tolerance: Patient tolerated treatment well ?Patient left: in chair;with call bell/phone within reach;with chair alarm set ?Nurse Communication: Mobility status;Weight bearing status ?PT Visit Diagnosis: History of falling (Z91.81);Other abnormalities of gait and mobility (R26.89);Muscle weakness (generalized) (M62.81);Pain ?Pain - Right/Left: Left ?Pain - part of body: Hip ?  ? ? ?Time: 5916-3846 ?PT Time Calculation (min) (ACUTE ONLY): 23 min ? ?Charges:  $Gait Training:  8-22 mins ?$Therapeutic Exercise: 8-22 mins          ?          ?D. Royetta Asal PT, DPT ?12/11/21, 3:54 PM ? ? ?

## 2021-12-11 NOTE — Plan of Care (Signed)

## 2021-12-12 ENCOUNTER — Emergency Department: Payer: Medicare PPO

## 2021-12-12 ENCOUNTER — Other Ambulatory Visit: Payer: Self-pay

## 2021-12-12 ENCOUNTER — Encounter: Payer: Self-pay | Admitting: Radiology

## 2021-12-12 ENCOUNTER — Emergency Department
Admission: EM | Admit: 2021-12-12 | Discharge: 2021-12-13 | Disposition: A | Discharge status: Home / Self Care | Payer: Medicare PPO | Attending: Emergency Medicine | Admitting: Emergency Medicine

## 2021-12-12 DIAGNOSIS — J45909 Unspecified asthma, uncomplicated: Secondary | ICD-10-CM | POA: Insufficient documentation

## 2021-12-12 DIAGNOSIS — M25552 Pain in left hip: Secondary | ICD-10-CM

## 2021-12-12 DIAGNOSIS — S199XXA Unspecified injury of neck, initial encounter: Secondary | ICD-10-CM | POA: Diagnosis present

## 2021-12-12 DIAGNOSIS — S0990XA Unspecified injury of head, initial encounter: Secondary | ICD-10-CM

## 2021-12-12 DIAGNOSIS — Z96642 Presence of left artificial hip joint: Secondary | ICD-10-CM | POA: Insufficient documentation

## 2021-12-12 DIAGNOSIS — Z7901 Long term (current) use of anticoagulants: Secondary | ICD-10-CM | POA: Diagnosis not present

## 2021-12-12 DIAGNOSIS — E119 Type 2 diabetes mellitus without complications: Secondary | ICD-10-CM | POA: Diagnosis not present

## 2021-12-12 DIAGNOSIS — W1830XA Fall on same level, unspecified, initial encounter: Secondary | ICD-10-CM | POA: Diagnosis not present

## 2021-12-12 DIAGNOSIS — W19XXXA Unspecified fall, initial encounter: Secondary | ICD-10-CM

## 2021-12-12 DIAGNOSIS — S161XXA Strain of muscle, fascia and tendon at neck level, initial encounter: Secondary | ICD-10-CM | POA: Diagnosis not present

## 2021-12-12 DIAGNOSIS — Z79899 Other long term (current) drug therapy: Secondary | ICD-10-CM | POA: Insufficient documentation

## 2021-12-12 DIAGNOSIS — J449 Chronic obstructive pulmonary disease, unspecified: Secondary | ICD-10-CM | POA: Diagnosis not present

## 2021-12-12 LAB — CBC
HCT: 27.8 % — ABNORMAL LOW (ref 36.0–46.0)
Hemoglobin: 9.2 g/dL — ABNORMAL LOW (ref 12.0–15.0)
MCH: 31.8 pg (ref 26.0–34.0)
MCHC: 33.1 g/dL (ref 30.0–36.0)
MCV: 96.2 fL (ref 80.0–100.0)
Platelets: 287 10*3/uL (ref 150–400)
RBC: 2.89 MIL/uL — ABNORMAL LOW (ref 3.87–5.11)
RDW: 14.4 % (ref 11.5–15.5)
WBC: 8.1 10*3/uL (ref 4.0–10.5)
nRBC: 0 % (ref 0.0–0.2)

## 2021-12-12 MED ORDER — ONDANSETRON HCL 4 MG PO TABS: 4.0000 mg | ORAL_TABLET | Freq: Four times a day (QID) | ORAL | 0 refills | Status: DC | PRN

## 2021-12-12 MED ORDER — ENOXAPARIN SODIUM 40 MG/0.4ML IJ SOSY: 40.0000 mg | PREFILLED_SYRINGE | INTRAMUSCULAR | 0 refills | Status: DC

## 2021-12-12 MED ORDER — FENTANYL CITRATE PF 50 MCG/ML IJ SOSY: 25.0000 ug | PREFILLED_SYRINGE | Freq: Once | INTRAMUSCULAR | Status: DC

## 2021-12-12 MED ORDER — TRAMADOL HCL 50 MG PO TABS: 50.0000 mg | ORAL_TABLET | Freq: Four times a day (QID) | ORAL | 0 refills | Status: DC | PRN

## 2021-12-12 NOTE — ED Triage Notes (Addendum)
Pt was BIB [facility] from home following a mechanical fall. Pt states getting up to use to bathroom when she fell landing on her back. C/O right sided head pain, neck pain, and left hip pain. Pt denies dizziness/LOC. A&Ox4 on arrival. Per note pt had recent hip surgery here on Thursday, surgical wound intact.  ? ? ?

## 2021-12-12 NOTE — Progress Notes (Signed)
Physical Therapy Treatment ?Patient Details ?Name: Yolanda Jimenez ?MRN: [id number] ?DOB: 10-09-1945 ?Today's Date: 12/12/2021 ? ? ?History of Present Illness Pt is a 76 y.o. female with history of type 2 diabetes, left hip fracture with replacement, DM, anemia, and PVD who presented to the ED for treatment and evaluation after mechanical, non-syncopal fall at home. Pt diagnosed with displaced left greater trochanteric fracture and is s/p ORIF. ? ?  ?PT Comments  ? ? Pt was pleasant and motivated to participate during the session and put forth good effort throughout. Pt was able to ambulate significantly further than during prior sessions and ambulated with grossly improved cadence and with occasional step-through pattern.  Pt required occasional min A for stability during dynamic standing balance training without UE support but was steady during all functional tasks with the RW.  Pt will benefit from HHPT upon discharge to safely address deficits listed in patient problem list for decreased caregiver assistance and eventual return to PLOF. ? ?   ?Recommendations for follow up therapy are one component of a multi-disciplinary discharge planning process, led by the attending physician.  Recommendations may be updated based on patient status, additional functional criteria and insurance authorization. ? ?Follow Up Recommendations ? Home health PT ?  ?  ?Assistance Recommended at Discharge Frequent or constant Supervision/Assistance  ?Patient can return home with the following A little help with walking and/or transfers;A little help with bathing/dressing/bathroom;Assistance with cooking/housework;Assist for transportation ?  ?Equipment Recommendations ? None recommended by PT  ?  ?Recommendations for Other Services   ? ? ?  ?Precautions / Restrictions Precautions ?Precautions: Fall ?Restrictions ?Weight Bearing Restrictions: Yes ?LLE Weight Bearing: Weight bearing as tolerated  ?  ? ?Mobility ? Bed Mobility ?  ?  ?  ?  ?   ?  ?  ?General bed mobility comments: NT, pt in recliner ?  ? ?Transfers ?Overall transfer level: Needs assistance ?Equipment used: Rolling walker (2 wheels) ?Transfers: Sit to/from Stand ?Sit to Stand: Supervision ?  ?  ?  ?  ?  ?General transfer comment: Good eccentric and concentric control and stability with min verbal cues for hand placement ?  ? ?Ambulation/Gait ?Ambulation/Gait assistance: Supervision ?Gait Distance (Feet): 60 Feet ?Assistive device: Rolling walker (2 wheels) ?Gait Pattern/deviations: Step-to pattern, Antalgic, Decreased stance time - left, Decreased step length - right, Step-through pattern ?Gait velocity: decreased ?  ?  ?General Gait Details: Slow cadence with step-to pattern with occasional step-through pattern this session; pt steady without LOB with mod verbal cues for decreased UE WB through the RW ? ? ?Stairs ?  ?  ?  ?  ?  ? ? ?Wheelchair Mobility ?  ? ?Modified Rankin (Stroke Patients Only) ?  ? ? ?  ?Balance Overall balance assessment: Needs assistance, History of Falls ?Sitting-balance support: Feet supported ?Sitting balance-Leahy Scale: Good ?  ?  ?Standing balance support: Bilateral upper extremity supported, During functional activity ?Standing balance-Leahy Scale: Fair ?  ?  ?  ?  ?  ?  ?  ?  ?  ?  ?  ?  ?  ? ?  ?Cognition Arousal/Alertness: Awake/alert ?Behavior During Therapy: [facility] for tasks assessed/performed ?Overall Cognitive Status: Within Functional Limits for tasks assessed ?  ?  ?  ?  ?  ?  ?  ?  ?  ?  ?  ?  ?  ?  ?  ?  ?  ?  ?  ? ?  ?Exercises Other Exercises ?Other  Exercises: Car transfer [employer] education ?Other Exercises: Dynamic standing balance training with feet apart, together, and semi-tandem with reaching outside BOS ? ?  ?General Comments   ?  ?  ? ?Pertinent Vitals/Pain Pain Assessment ?Pain Assessment: 0-10 ?Pain Score: 6  ?Pain Location: L hip- no pain at rest ?Pain Descriptors / Indicators: Sore, Aching ?Pain Intervention(s): Repositioned,  Premedicated before session, Monitored during session  ? ? ?Home Living   ?  ?  ?  ?  ?  ?  ?  ?  ?  ?   ?  ?Prior Function    ?  ?  ?   ? ?PT Goals (current goals can now be found in the care plan section) Progress towards PT goals: Progressing toward goals ? ?  ?Frequency ? ? ? BID ? ? ? ?  ?PT Plan Current plan remains appropriate  ? ? ?Co-evaluation   ?  ?  ?  ?  ? ?  ?AM-PAC PT "6 Clicks" Mobility   ?Outcome Measure ? Help needed turning from your back to your side while in a flat bed without using bedrails?: A Little ?Help needed moving from lying on your back to sitting on the side of a flat bed without using bedrails?: A Little ?Help needed moving to and from a bed to a chair (including a wheelchair)?: A Little ?Help needed standing up from a chair using your arms (e.g., wheelchair or bedside chair)?: A Little ?Help needed to walk in hospital room?: A Little ?Help needed climbing 3-5 steps with a railing? : A Little ?6 Click Score: 18 ? ?  ?End of Session Equipment Utilized During Treatment: Gait belt ?Activity Tolerance: Patient tolerated treatment well ?Patient left: in chair;with call bell/phone within reach;with chair alarm set ?Nurse Communication: Mobility status;Weight bearing status ?PT Visit Diagnosis: History of falling (Z91.81);Other abnormalities of gait and mobility (R26.89);Muscle weakness (generalized) (M62.81);Pain ?Pain - Right/Left: Left ?Pain - part of body: Hip ?  ? ? ?Time: 5465-6812 ?PT Time Calculation (min) (ACUTE ONLY): 24 min ? ?Charges:  $Gait Training: 8-22 mins ?$Therapeutic Exercise: 8-22 mins          ?          ? ?D. Royetta Asal PT, DPT ?12/12/21, 10:43 AM ? ? ? ?

## 2021-12-12 NOTE — ED Provider Notes (Signed)
? ?[facility] ?Provider Note ? ? ? Event Date/Time  ? First MD Initiated Contact with Patient 12/12/21 2345   ?  (approximate) ? ? ?History  ? ?Fall ? ? ?HPI ? ?Yolanda Jimenez is a 76 y.o. female brought to the ED via EMS from home status post mechanical fall with left hip pain.  Patient had left hip surgery on 12/10/2021. Came home today; ambulated to restroom, lost her balance and fell onto her left side. "Conked" her head on the wall but did not lose consciousness.  States she is taking a blood thinner.  Endorses mild neck pain.  Denies vision changes, chest pain, shortness of breath, abdominal pain, nausea, vomiting or dizziness. ?  ? ? ?Past Medical History  ? ?Past Medical History:  ?Diagnosis Date  ? A-fib ([place])   ? Aortic atherosclerosis ([place])   ? Arthritis   ? back  ? Asthma   ? AVM (arteriovenous malformation)   ? Barrett esophagus   ? C. difficile diarrhea   ? Carotid stenosis   ? Complication of anesthesia   ? very hard to come out of anesthesia and pt states she gets very" loopy" after surgeries  ? COPD (chronic obstructive pulmonary disease) ([place])   ? no inhalers  ? Depression   ? Diabetes mellitus without complication ([place])   ? borderline  ? Diverticulosis   ? GERD (gastroesophageal reflux disease)   ? Heart murmur   ? Hx of adenomatous colonic polyps   ? Hyperlipidemia   ? IBS (irritable bowel syndrome)   ? Insomnia   ? Iron deficiency anemia   ? Kidney cysts   ? hx UTIs  ? Macular degeneration   ? left eye  ? Microscopic hematuria   ? Osteoporosis   ? mild  ? PVD (peripheral vascular disease) ([place])   ? Wears dentures   ? partial lower  ? ? ? ?Active Problem List  ? ?Patient Active Problem List  ? Diagnosis Date Noted  ? Trochanteric avulsion fracture of femur ([place]) 12/10/2021  ? Personal history of colonic polyps   ? Polyp of transverse colon   ? 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 Loss of weight   ? Polyp of ascending colon   ? Moderate  protein-calorie malnutrition ([place]) 09/25/2018  ? Tubular adenoma 09/25/2018  ? Adult idiopathic generalized osteoporosis 08/03/2018  ? Aortic atherosclerosis ([place]) 08/03/2018  ? 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 Cellulitis of left hip 06/01/2018  ? Status post hip hemiarthroplasty 05/17/2018  ? Hip fracture ([place]) 05/15/2018  ? Tobacco abuse 04/04/2018  ? DM type 2 with diabetic mixed hyperlipidemia ([place]) 03/08/2018  ? Low serum vitamin D 09/09/2017  ? History of Barrett's esophagus   ? Esophagitis, unspecified   ? PAD (peripheral artery disease) ([place]) 10/15/2016  ? Carotid stenosis 10/15/2016  ? Pain in toe 09/17/2016  ? Renal cyst, acquired, left 09/13/2016  ? Medicare annual wellness visit, initial 09/03/2016  ? Type 2 diabetes mellitus with peripheral angiopathy ([place]) 08/29/2014  ? Combined fat and carbohydrate induced hyperlipemia 08/29/2014  ? Incomplete emptying of bladder 07/24/2014  ? Increased frequency of urination 03/19/2014  ? Lower abdominal pain 03/02/2014  ? Microscopic hematuria 03/01/2014  ? Nocturia 03/01/2014  ? Urinary tract infection, site not specified 03/01/2014  ? Diarrhea 03/05/2013  ? COLONIC POLYPS, ADENOMATOUS 09/05/2007  ? Hyperlipidemia 09/05/2007  ? IRON DEFICIENCY 09/05/2007  ? BARRETTS ESOPHAGUS 09/05/2007  ? DIVERTICULOSIS, COLON 09/05/2007  ?  IRRITABLE BOWEL SYNDROME 09/05/2007  ? ARTERIOVENOUS MALFORMATION 09/05/2007  ? ? ? ?Past Surgical History  ? ?Past Surgical History:  ?Procedure Laterality Date  ? BUNIONECTOMY    ? right foot  ? CATARACT EXTRACTION    ? both eyes  ? CERVICAL CONE BIOPSY    ? CHOLECYSTECTOMY    ? COLONOSCOPY    ? COLONOSCOPY WITH PROPOFOL N/A 01/22/2019  ? Procedure: COLONOSCOPY WITH BIOPSY;  Surgeon: Lucilla Lame, MD;  Location: [place];  Service: Endoscopy;  Laterality: N/A;  diabetic - diet controlled  ? COLONOSCOPY WITH PROPOFOL N/A 06/27/2020  ? Procedure: COLONOSCOPY WITH BIOPSY;  Surgeon: Lucilla Lame, MD;  Location: [place];  Service: Endoscopy;  Laterality: N/A;  priority 4  ? ESOPHAGOGASTRODUODENOSCOPY (EGD) WITH PROPOFOL N/A 08/29/2017  ? Procedure: ESOPHAGOGASTRODUODENOSCOPY (EGD) WITH PROPOFOL;  Surgeon: Lucilla Lame, MD;  Location: [place];  Service: Endoscopy;  Laterality: N/A;  ? FOOT SURGERY    ? right 2nd toe  ? HIP ARTHROPLASTY Left 05/16/2018  ? Procedure: ARTHROPLASTY BIPOLAR HIP (HEMIARTHROPLASTY);  Surgeon: Corky Mull, MD;  Location: [facility];  Service: Orthopedics;  Laterality: Left;  ? ORIF PERIPROSTHETIC FRACTURE Left 12/10/2021  ? Procedure: OPEN REDUCTION INTERNAL FIXATION OF A LEFT GREATER TROCHANTERIC FRACTURE;  Surgeon: Corky Mull, MD;  Location: [facility];  Service: Orthopedics;  Laterality: Left;  ? ORIF WRIST FRACTURE Right 10/07/2020  ? Procedure: OPEN REDUCTION INTERNAL FIXATION (ORIF) WRIST FRACTURE;  Surgeon: Hessie Knows, MD;  Location: [facility];  Service: Orthopedics;  Laterality: Right;  ? POLYPECTOMY N/A 01/22/2019  ? Procedure: POLYPECTOMY;  Surgeon: Lucilla Lame, MD;  Location: [place];  Service: Endoscopy;  Laterality: N/A;  2 Clips placed at polyp removal site in ascending colon  ? POLYPECTOMY N/A 06/27/2020  ? Procedure: POLYPECTOMY;  Surgeon: Lucilla Lame, MD;  Location: [place];  Service: Endoscopy;  Laterality: N/A;  ? TUBAL LIGATION    ? UPPER GASTROINTESTINAL ENDOSCOPY    ? ? ? ?Home Medications  ? ?Prior to Admission medications   ?Medication Sig Start Date End Date Taking? Authorizing Provider  ?acetaminophen (TYLENOL) 500 MG tablet Take 1,000 mg by mouth every 6 (six) hours as needed.    [provider]  ?Ascorbic Acid (VITAMIN C) 1000 MG tablet Take 1,000 mg by mouth in the morning.    [provider]  ?B Complex-C-Folic Acid (STRESS B COMPLEX PO) Take 1 capsule by mouth in the morning.    [provider]  ?Calcium Carb-Cholecalciferol (CALCIUM 600+D3 PO) Take 1 tablet by mouth in the morning. ?Patient not taking: Reported  on 12/09/2021    [provider]  ?Cholecalciferol 50 MCG (2000 UT) CAPS Take 2,000 Units by mouth in the morning.    [provider]  ?CRANBERRY-VITAMIN C PO Take 1 tablet by mouth 3 (three) times a week.    [provider]  ?diazepam (VALIUM) 5 MG tablet Take 1 tablet (5 mg total) by mouth daily as needed for anxiety. ?Patient taking differently: Take 5 mg by mouth 2 (two) times daily as needed for anxiety. 05/24/18   Gerlene Fee, NP  ?diltiazem (CARDIZEM) 30 MG tablet Take 30 mg by mouth daily before breakfast. 07/15/21   [provider]  ?enoxaparin (LOVENOX) 40 MG/0.4ML injection Inject 0.4 mLs (40 mg total) into the skin daily. 12/12/21   Fausto Skillern, PA-C  ?estradiol (ESTRACE) 0.1 MG/GM vaginal cream Estrogen Cream Instruction Discard applicator Apply pea sized amount  to tip of finger to urethra before bed. Wash hands well after application. Use Monday, Wednesday and Friday ?Patient not taking: Reported on 12/09/2021 09/29/21   Hollice Espy, MD  ?ferrous gluconate (FERGON) 240 (27 FE) MG tablet Take 240 mg by mouth in the morning.    [provider]  ?fluticasone (FLONASE) 50 MCG/ACT nasal spray Place 1 spray into both nostrils daily as needed for allergies or rhinitis.    [provider]  ?loperamide (IMODIUM) 2 MG capsule Take 2 mg by mouth every morning.    [provider]  ?Misc Natural Products (Laurium PO) Take 2 capsules by mouth 3 (three) times a week. [place]    [provider]  ?Multiple Vitamins-Minerals (PRESERVISION AREDS 2 PO) Take 2 tablets by mouth in the morning.    [provider]  ?omeprazole (PRILOSEC) 40 MG capsule Take 40 mg by mouth daily before breakfast.    [provider]  ?ondansetron (ZOFRAN) 4 MG tablet Take 1 tablet (4 mg total) by mouth every 6 (six) hours as needed for nausea. 12/12/21   Fausto Skillern, PA-C  ?PARoxetine  (PAXIL) 20 MG tablet Take 20 mg by mouth in the morning. 11/19/21   [provider]  ?simvastatin (ZOCOR) 40 MG tablet Take 40 mg by mouth at bedtime.    [provider]  ?traMADol (ULTRAM) 50 MG tabl

## 2021-12-12 NOTE — Progress Notes (Addendum)
?  Subjective: ?2 Days Post-Op Procedure(s) (LRB): ?OPEN REDUCTION INTERNAL FIXATION OF A LEFT GREATER TROCHANTERIC FRACTURE (Left) ?Patient reports pain as well-controlled.   ?Patient is well, and has had no acute complaints or problems ?Plan is to go Home after hospital stay. ?Negative for chest pain and shortness of breath ?Fever: no ?Gastrointestinal: negative for nausea and vomiting.  Patient reports small BM yesterday. States she has IBS-D and takes immodium daily.  ? ?Objective: ?Vital signs in last 24 hours: ?Temp:  [98.2 ?F (36.8 ?C)-99.3 ?F (37.4 ?C)] 98.2 ?F (36.8 ?C) (05/06 2263) ?Pulse Rate:  [64-68] 68 (05/06 0854) ?Resp:  [16-18] 18 (05/06 0854) ?BP: (123-140)/(66-72) 129/72 (05/06 3354) ?SpO2:  [96 %-100 %] 96 % (05/06 0854) ? ?Intake/Output from previous day: ? ?Intake/Output Summary (Last 24 hours) at 12/12/2021 0904 ?Last data filed at 12/11/2021 1849 ?Gross per 24 hour  ?Intake 750 ml  ?Output --  ?Net 750 ml  ?  ?Intake/Output this shift: ?No intake/output data recorded. ? ?Labs: ?Recent Labs  ?  12/11/21 ?0615 12/12/21 ?0410  ?HGB 9.8* 9.2*  ? ?Recent Labs  ?  12/11/21 ?0615 12/12/21 ?0410  ?WBC 8.8 8.1  ?RBC 3.16* 2.89*  ?HCT 30.5* 27.8*  ?PLT 307 287  ? ?Recent Labs  ?  12/11/21 ?0615  ?NA 137  ?K 3.5  ?CL 106  ?CO2 22  ?BUN 9  ?CREATININE 0.65  ?GLUCOSE 112*  ?CALCIUM 8.0*  ? ?No results for input(s): LABPT, INR in the last 72 hours. ? ? ?EXAM ?General - Patient is Alert, Appropriate, and Oriented ?Extremity - Neurovascular intact ?Dorsiflexion/Plantar flexion intact ?Compartment soft ?Dressing/Incision -clean, dry, minimal dried bloody drainage  ?Motor Function - intact, moving foot and toes well on exam.  ? ? ? ?Assessment/Plan: ?2 Days Post-Op Procedure(s) (LRB): ?OPEN REDUCTION INTERNAL FIXATION OF A LEFT GREATER TROCHANTERIC FRACTURE (Left) ?Principal Problem: ?  Trochanteric avulsion fracture of femur ([place]) ? ?Estimated body mass index is 19.46 kg/m? as calculated from the following: ?   Height as of this encounter: '5\' 1"'$  (1.549 m). ?  Weight as of this encounter: 46.7 kg. ?Advance diet ?Up with therapy ? ?Labs reviewed. Discussed possible d/c tomorrow pending adequate progress with therapy. Patient has 2 sons that will be able to assist her at home.  ? ?*addendum: Patient made good progress with PT and was cleared to discharge today. Patient's son is en route from [place] to take her home. Patient will d/c today  ?  ? ?DVT Prophylaxis - Lovenox and SCDs ?Weight-Bearing as tolerated to left leg, no resisted abduction  ? ?Cassell Smiles, PA-C ?[facility] ?12/12/2021, 9:04 AM ? ?

## 2021-12-12 NOTE — Progress Notes (Signed)
Occupational Therapy Treatment ?Patient Details ?Name: Yolanda Jimenez ?MRN: [id number] ?DOB: 12-21-1945 ?Today's Date: 12/12/2021 ? ? ?History of present illness Pt is a 76 y.o. female with history of type 2 diabetes, left hip fracture with replacement, DM, anemia, and PVD who presented to the ED for treatment and evaluation after mechanical, non-syncopal fall at home. Pt diagnosed with displaced left greater trochanteric fracture and is s/p ORIF. ?  ?OT comments ? Chart reviewed to date, pt agreeable to OT tx session. Tx session targeted progressing activity tolerance for improved independence in ADL tasks. Pt completes all ADL at SET UP-supervision level on this date with RW. Intermittent vcs required for RW use. Pt educated re; use of AE/DME at home as needed for falls prevention/safe ADL completion. Pt will have assist as home as needed. Pt is left as received, NAD, all needs met. OT will follow acutely.   ? ?Recommendations for follow up therapy are one component of a multi-disciplinary discharge planning process, led by the attending physician.  Recommendations may be updated based on patient status, additional functional criteria and insurance authorization. ?   ?Follow Up Recommendations ? No OT follow up  ?  ?Assistance Recommended at Discharge Intermittent Supervision/Assistance  ?Patient can return home with the following ? A little help with walking and/or transfers;A little help with bathing/dressing/bathroom;Assistance with cooking/housework;Assist for transportation;Help with stairs or ramp for entrance ?  ?Equipment Recommendations ?    ?  ?Recommendations for Other Services   ? ?  ?Precautions / Restrictions    ? ? ?  ? ?Mobility Bed Mobility ?  ?  ?  ?  ?  ?  ?  ?  ?  ? ?Transfers ?  ?  ?  ?  ?  ?  ?  ?  ?  ?  ?  ?  ?Balance   ?  ?  ?  ?  ?  ?  ?  ?  ?  ?  ?  ?  ?  ?  ?  ?  ?  ?  ?   ? ?ADL either performed or assessed with clinical judgement  ? ?ADL Overall ADL's : Needs assistance/impaired ?  ?  ?  ?   ?  ?  ?  ?  ?  ?  ?  ?  ?  ?  ?  ?  ?  ?  ?  ?General ADL Comments: supervision with vcs for STS, amb to bathroom with RW with supervision, toilet transfer to [facility] with supervision; intermittent vcs throughout for RW use/safety: LB dressing (slip on shoes) withSET UP ?  ? ?Extremity/Trunk Assessment   ?  ?  ?  ?  ?  ? ?Vision   ?  ?  ?Perception   ?  ?Praxis   ?  ? ?Cognition Arousal/Alertness: Awake/alert ?Behavior During Therapy: [facility] for tasks assessed/performed ?Overall Cognitive Status: Within Functional Limits for tasks assessed ?  ?  ?  ?  ?  ?  ?  ?  ?  ?  ?  ?  ?  ?  ?  ?  ?  ?  ?  ?   ?Exercises   ? ?  ?Shoulder Instructions   ? ? ?  ?General Comments    ? ? ?Pertinent Vitals/ Pain       Pain Assessment ?Pain Assessment: Faces ?Faces Pain Scale: Hurts little more ?Pain Location: L hip- no pain at rest ?Pain Descriptors / Indicators: Sore ?Pain Intervention(s):  Limited activity within patient's tolerance, Monitored during session, Repositioned ? ?Home Living   ?  ?  ?  ?  ?  ?  ?  ?  ?  ?  ?  ?  ?  ?  ?  ?  ?  ?  ? ?  ?Prior Functioning/Environment    ?  ?  ?  ?   ? ?Frequency ? Min 2X/week  ? ? ? ? ?  ?Progress Toward Goals ? ?OT Goals(current goals can now be found in the care plan section) ? Progress towards OT goals: Progressing toward goals ? ?Acute Rehab OT Goals ?Patient Stated Goal: go home ?OT Goal Formulation: With patient ?Time For Goal Achievement: 12/26/21 ?Potential to Achieve Goals: Good  ?Plan     ? ?Co-evaluation ? ? ?   ?  ?  ?  ?  ? ?  ?AM-PAC OT "6 Clicks" Daily Activity     ?Outcome Measure ? ? Help from another person eating meals?: None ?Help from another person taking care of personal grooming?: None ?Help from another person toileting, which includes using toliet, bedpan, or urinal?: None ?Help from another person bathing (including washing, rinsing, drying)?: A Little ?Help from another person to put on and taking off regular upper body clothing?: None ?Help from another person to  put on and taking off regular lower body clothing?: A Little ?6 Click Score: 22 ? ?  ?End of Session Equipment Utilized During Treatment: Gait belt;Rolling walker (2 wheels) ? ?OT Visit Diagnosis: Unsteadiness on feet (R26.81);History of falling (Z91.81) ?  ?Activity Tolerance Patient tolerated treatment well ?  ?Patient Left in chair;with call bell/phone within reach;with chair alarm set;with family/visitor present ?  ?Nurse Communication Mobility status ?  ? ?   ? ?Time: 7900-9200 ?OT Time Calculation (min): 30 min ? ?Charges: OT General Charges ?$OT Visit: 1 Visit ?OT Treatments ?$Self Care/Home Management : 23-37 mins ? ?Shanon Payor, OTD OTR/L  ?12/12/21, 10:37 AM  ?

## 2021-12-12 NOTE — Plan of Care (Signed)
?  Problem: Education: Goal: Knowledge of the prescribed therapeutic regimen will improve Outcome: Progressing Goal: Understanding of discharge needs will improve Outcome: Progressing   Problem: Activity: Goal: Ability to avoid complications of mobility impairment will improve Outcome: Progressing Goal: Ability to tolerate increased activity will improve Outcome: Progressing   Problem: Clinical Measurements: Goal: Postoperative complications will be avoided or minimized Outcome: Progressing   Problem: Pain Management: Goal: Pain level will decrease with appropriate interventions Outcome: Progressing   Problem: Skin Integrity: Goal: Will show signs of wound healing Outcome: Progressing   

## 2021-12-12 NOTE — ED Notes (Signed)
Patient transported to CT 

## 2021-12-13 LAB — CBC WITH DIFFERENTIAL/PLATELET
Abs Immature Granulocytes: 0.03 10*3/uL (ref 0.00–0.07)
Basophils Absolute: 0 10*3/uL (ref 0.0–0.1)
Basophils Relative: 0 %
Eosinophils Absolute: 0.1 10*3/uL (ref 0.0–0.5)
Eosinophils Relative: 2 %
HCT: 28.3 % — ABNORMAL LOW (ref 36.0–46.0)
Hemoglobin: 9.2 g/dL — ABNORMAL LOW (ref 12.0–15.0)
Immature Granulocytes: 0 %
Lymphocytes Relative: 10 %
Lymphs Abs: 0.8 10*3/uL (ref 0.7–4.0)
MCH: 31 pg (ref 26.0–34.0)
MCHC: 32.5 g/dL (ref 30.0–36.0)
MCV: 95.3 fL (ref 80.0–100.0)
Monocytes Absolute: 1.3 10*3/uL — ABNORMAL HIGH (ref 0.1–1.0)
Monocytes Relative: 17 %
Neutro Abs: 5.6 10*3/uL (ref 1.7–7.7)
Neutrophils Relative %: 71 %
Platelets: 317 10*3/uL (ref 150–400)
RBC: 2.97 MIL/uL — ABNORMAL LOW (ref 3.87–5.11)
RDW: 14.1 % (ref 11.5–15.5)
WBC: 7.8 10*3/uL (ref 4.0–10.5)
nRBC: 0 % (ref 0.0–0.2)

## 2021-12-13 LAB — BASIC METABOLIC PANEL
Anion gap: 6 (ref 5–15)
BUN: 11 mg/dL (ref 8–23)
CO2: 26 mmol/L (ref 22–32)
Calcium: 8.3 mg/dL — ABNORMAL LOW (ref 8.9–10.3)
Chloride: 104 mmol/L (ref 98–111)
Creatinine, Ser: 0.67 mg/dL (ref 0.44–1.00)
GFR, Estimated: >60 mL/min (ref >60)
Glucose, Bld: 123 mg/dL — ABNORMAL HIGH (ref 70–99)
Potassium: 3.7 mmol/L (ref 3.5–5.1)
Sodium: 136 mmol/L (ref 135–145)

## 2021-12-13 LAB — PROTIME-INR
INR: 1.2 (ref 0.8–1.2)
Prothrombin Time: 15 seconds (ref 11.4–15.2)

## 2021-12-13 MED ORDER — ACETAMINOPHEN 325 MG PO TABS
650.0000 mg | ORAL_TABLET | Freq: Once | ORAL | Status: AC
  Administered 2021-12-13: 650 mg via ORAL
  Filled 2021-12-13: qty 2

## 2021-12-13 NOTE — ED Notes (Signed)
This RN first encounter with pt prior to discharge. Pt verbalized understanding of discharge instructions and follow-up care instructions. Pt advised if symptoms worsen to return to ED.  ?

## 2021-12-13 NOTE — ED Notes (Signed)
Pt was able to ambulate to and from, the bathroom with walker. Pt denied any pain from recent fall.  ?

## 2021-12-13 NOTE — Discharge Instructions (Signed)
Continuing Tylenol and Tramadol as needed for pain.  Return to the ER for worsening symptoms, persistent vomiting, difficulty breathing, lethargy or other concerns. ?

## 2021-12-29 ENCOUNTER — Other Ambulatory Visit: Payer: Self-pay

## 2021-12-29 ENCOUNTER — Telehealth (INDEPENDENT_AMBULATORY_CARE_PROVIDER_SITE_OTHER): Payer: Medicare PPO | Admitting: Urology

## 2021-12-29 DIAGNOSIS — R300 Dysuria: Secondary | ICD-10-CM

## 2021-12-29 LAB — URINALYSIS, COMPLETE
Bilirubin, UA: NEGATIVE
Glucose, UA: NEGATIVE
Ketones, UA: NEGATIVE
Nitrite, UA: NEGATIVE
Specific Gravity, UA: 1.015 (ref 1.005–1.030)
Urobilinogen, Ur: 0.2 mg/dL (ref 0.2–1.0)
pH, UA: 6 (ref 5.0–7.5)

## 2021-12-29 LAB — MICROSCOPIC EXAMINATION: WBC, UA: >30 /hpf — AB (ref 0–5)

## 2021-12-29 MED ORDER — SULFAMETHOXAZOLE-TRIMETHOPRIM 800-160 MG PO TABS
1.0000 | ORAL_TABLET | Freq: Two times a day (BID) | ORAL | 0 refills | Status: DC
Start: 2021-12-29 — End: 2022-01-26

## 2021-12-29 NOTE — Progress Notes (Signed)
Virtual Visit via Video Note  I connected with Yolanda Jimenez on 12/29/2021 at 11:00 AM EDT by a video enabled telemedicine application and verified that I am speaking with the correct person using two identifiers.  Location: Patient: home Provider: office   I discussed the limitations of evaluation and management by telemedicine and the availability of in person appointments. The patient expressed understanding and agreed to proceed.  History of Present Illness: Yolanda Jimenez is a 76 y.o. female  with a personal history of hematuria, left renal cyst, rUTIs, urinary urgency/frequency/weak urinary stream, who presents today for further evaluation   She underwent cystoscopy on 09/22/2021 that was unremarkable.   She reports today over the past day or 2, she said extreme urinary frequency, up to every 30 minutes.  Is very bothersome.  She also some lower abdominal pressure but no overt dysuria.  No gross hematuria.  No fevers or chills.  She recently had a fracture of hip.  She is recovering is having difficulty with mobility.  She had requested a virtual visit in her family member transported her urine to the office today.  She was provided with a sterile cup.     Observations/Objective: She reports dysuria. Patient denies any modifying or aggravating factors.  Patient denies any gross hematuria, dysuria or suprapubic/flank pain. Patient denies any fevers, chills, nausea or vomiting.   UA today showed > 30 wbcs , 3-10 rbcs, nitrite negative few bacteria.   Assessment and Plan: Recurrent UTI/ acute cystitis - UA suspicious. Will send urine for culture to rule out infection. In the interim will treat her with Bactrim ds bid x 5 days  Follow Up Instructions: As scheduled or sooner as needed   I discussed the assessment and treatment plan with the patient. The patient was provided an opportunity to ask questions and all were answered. The patient agreed with the plan and demonstrated an  understanding of the instructions.   The patient was advised to call back or seek an in-person evaluation if the symptoms worsen or if the condition fails to improve as anticipated.  I provided 8 minutes of non-face-to-face time during this encounter.  I,Kailey Littlejohn,acting as a Education administrator for Hollice Espy, MD.,have documented all relevant documentation on the behalf of Hollice Espy, MD,as directed by  Hollice Espy, MD while in the presence of Hollice Espy, MD.

## 2021-12-29 NOTE — Addendum Note (Signed)
Addended by: Alvera Novel on: 12/29/2021 11:00 AM   Modules accepted: Orders

## 2021-12-29 NOTE — Addendum Note (Signed)
Addended by: Alvera Novel on: 12/29/2021 11:02 AM   Modules accepted: Orders

## 2021-12-29 NOTE — Progress Notes (Signed)
This service is provided via telemedicine   No vital signs collected/recorded due to the encounter was a telemedicine visit.   All medications, pharmacy and history were reviewed   Patient consents to a telephone visit:  yes    Names of all persons participating in the telemedicine service and their role in the encounter:  Verlene Mayer, [place]

## 2022-01-01 LAB — CULTURE, URINE COMPREHENSIVE

## 2022-01-26 ENCOUNTER — Encounter: Payer: Self-pay | Admitting: Physician Assistant

## 2022-01-26 ENCOUNTER — Ambulatory Visit: Payer: Medicare PPO | Admitting: Physician Assistant

## 2022-01-26 VITALS — BP 112/70 | HR 82 | Ht 61.0 in | Wt 98.0 lb

## 2022-01-26 DIAGNOSIS — R300 Dysuria: Secondary | ICD-10-CM

## 2022-01-26 DIAGNOSIS — R3915 Urgency of urination: Secondary | ICD-10-CM | POA: Diagnosis not present

## 2022-01-26 DIAGNOSIS — Z87440 Personal history of urinary (tract) infections: Secondary | ICD-10-CM

## 2022-01-26 DIAGNOSIS — N390 Urinary tract infection, site not specified: Secondary | ICD-10-CM

## 2022-01-26 LAB — URINALYSIS, COMPLETE
Bilirubin, UA: NEGATIVE
Glucose, UA: NEGATIVE
Ketones, UA: NEGATIVE
Nitrite, UA: NEGATIVE
Specific Gravity, UA: 1.03 (ref 1.005–1.030)
Urobilinogen, Ur: 0.2 mg/dL (ref 0.2–1.0)
pH, UA: 5.5 (ref 5.0–7.5)

## 2022-01-26 LAB — MICROSCOPIC EXAMINATION: WBC, UA: >30 /hpf — AB (ref 0–5)

## 2022-01-26 MED ORDER — NITROFURANTOIN MONOHYD MACRO 100 MG PO CAPS: 100.0000 mg | ORAL_CAPSULE | Freq: Two times a day (BID) | ORAL | 0 refills | Status: AC

## 2022-01-26 NOTE — Progress Notes (Signed)
01/26/2022 12:03 PM   Yolanda Jimenez 03-31-1946 [id number]  CC: Chief Complaint  Patient presents with   Dysuria   HPI: Yolanda Jimenez is a 76 y.o. female with PMH hematuria with benign cystoscopy in February 2023, left renal cyst, recurrent UTIs, and LUTS including urinary urgency, frequency, and weak stream who presents today for evaluation of possible UTI.   Today she reports a 1 day history of dysuria and urgency.  She describes the urgency is severe.  She denies fever, chills, nausea, or vomiting.  She was treated for acute cystitis by Dr. Erlene Quan on 12/29/2021 and was treated with Bactrim DS twice daily x5 days empirically, urine culture grew ampicillin resistant E. coli.  She is not taking anything for UTI prevention at this point and is frustrated with her frequency of UTIs, now approximately 5-6 in the past year.  She was previously prescribed estrogen cream and use this for short time but was unsure how long she needed to continue it.  In-office UA today positive for 2+ blood, 2+ protein, and 1+ leukocyte esterase; urine microscopy with >30 WBCs/HPF, 11-30 RBCs/HPF, and moderate bacteria.   PMH: Past Medical History:  Diagnosis Date   A-fib ([place])    Aortic atherosclerosis ([place])    Arthritis    back   Asthma    AVM (arteriovenous malformation)    Barrett esophagus    C. difficile diarrhea    Carotid stenosis    Complication of anesthesia    very hard to come out of anesthesia and pt states she gets very" loopy" after surgeries   COPD (chronic obstructive pulmonary disease) (HCC)    no inhalers   Depression    Diabetes mellitus without complication (HCC)    borderline   Diverticulosis    GERD (gastroesophageal reflux disease)    Heart murmur    Hx of adenomatous colonic polyps    Hyperlipidemia    IBS (irritable bowel syndrome)    Insomnia    Iron deficiency anemia    Kidney cysts    hx UTIs   Macular degeneration    left eye   Microscopic hematuria     Osteoporosis    mild   PVD (peripheral vascular disease) ([place])    Wears dentures    partial lower    Surgical History: Past Surgical History:  Procedure Laterality Date   BUNIONECTOMY     right foot   CATARACT EXTRACTION     both eyes   CERVICAL CONE BIOPSY     CHOLECYSTECTOMY     COLONOSCOPY     COLONOSCOPY WITH PROPOFOL N/A 01/22/2019   Procedure: COLONOSCOPY WITH BIOPSY;  Surgeon: Lucilla Lame, MD;  Location: [place];  Service: Endoscopy;  Laterality: N/A;  diabetic - diet controlled   COLONOSCOPY WITH PROPOFOL N/A 06/27/2020   Procedure: COLONOSCOPY WITH BIOPSY;  Surgeon: Lucilla Lame, MD;  Location: [place];  Service: Endoscopy;  Laterality: N/A;  priority 4   ESOPHAGOGASTRODUODENOSCOPY (EGD) WITH PROPOFOL N/A 08/29/2017   Procedure: ESOPHAGOGASTRODUODENOSCOPY (EGD) WITH PROPOFOL;  Surgeon: Lucilla Lame, MD;  Location: [place];  Service: Endoscopy;  Laterality: N/A;   FOOT SURGERY     right 2nd toe   HIP ARTHROPLASTY Left 05/16/2018   Procedure: ARTHROPLASTY BIPOLAR HIP (HEMIARTHROPLASTY);  Surgeon: Corky Mull, MD;  Location: [facility];  Service: Orthopedics;  Laterality: Left;   ORIF PERIPROSTHETIC FRACTURE Left 12/10/2021   Procedure: OPEN REDUCTION INTERNAL FIXATION OF A LEFT GREATER TROCHANTERIC FRACTURE;  Surgeon: Corky Mull, MD;  Location: [facility];  Service: Orthopedics;  Laterality: Left;   ORIF WRIST FRACTURE Right 10/07/2020   Procedure: OPEN REDUCTION INTERNAL FIXATION (ORIF) WRIST FRACTURE;  Surgeon: Hessie Knows, MD;  Location: [facility];  Service: Orthopedics;  Laterality: Right;   POLYPECTOMY N/A 01/22/2019   Procedure: POLYPECTOMY;  Surgeon: Lucilla Lame, MD;  Location: [place];  Service: Endoscopy;  Laterality: N/A;  2 Clips placed at polyp removal site in ascending colon   POLYPECTOMY N/A 06/27/2020   Procedure: POLYPECTOMY;  Surgeon: Lucilla Lame, MD;  Location: [place];  Service: Endoscopy;   Laterality: N/A;   TUBAL LIGATION     UPPER GASTROINTESTINAL ENDOSCOPY      Home Medications:  Allergies as of 01/26/2022       Reactions   Codeine    "felt drunk, crazy"   Hydrocodone-acetaminophen Other (See Comments)   Confusion/delirium   Lidocaine    Heart racing, increased BP - from local at dentist (likely epi)   Nsaids Other (See Comments)   Avoids due to IBS symptoms (bleeding symptoms)   Oxycodone    Confusion/delirium        Medication List        Accurate as of January 26, 2022 12:03 PM. If you have any questions, ask your nurse or doctor.          STOP taking these medications    sulfamethoxazole-trimethoprim 800-160 MG tablet Commonly known as: BACTRIM DS Stopped by: Debroah Loop, PA-C       TAKE these medications    acetaminophen 500 MG tablet Commonly known as: TYLENOL Take 1,000 mg by mouth every 6 (six) hours as needed.   BRAINSTRONG MEMORY SUPPORT PO Take 2 capsules by mouth 3 (three) times a week. [place]   CALCIUM 600+D3 PO Take 1 tablet by mouth in the morning.   celecoxib 200 MG capsule Commonly known as: CELEBREX Take by mouth.   Cholecalciferol 50 MCG (2000 UT) Caps Take 2,000 Units by mouth in the morning.   CRANBERRY-VITAMIN C PO Take 1 tablet by mouth 3 (three) times a week.   diazepam 5 MG tablet Commonly known as: VALIUM Take 1 tablet (5 mg total) by mouth daily as needed for anxiety. What changed: when to take this   diltiazem 30 MG tablet Commonly known as: CARDIZEM Take 30 mg by mouth daily before breakfast.   enoxaparin 40 MG/0.4ML injection Commonly known as: LOVENOX Inject 0.4 mLs (40 mg total) into the skin daily.   estradiol 0.1 MG/GM vaginal cream Commonly known as: ESTRACE Estrogen Cream Instruction Discard applicator Apply pea sized amount to tip of finger to urethra before bed. Wash hands well after application. Use Monday, Wednesday and Friday    ferrous gluconate 240 (27 FE) MG tablet Commonly known as: FERGON Take 240 mg by mouth in the morning.   fluticasone 50 MCG/ACT nasal spray Commonly known as: FLONASE Place 1 spray into both nostrils daily as needed for allergies or rhinitis.   loperamide 2 MG capsule Commonly known as: IMODIUM Take 2 mg by mouth every morning.   nitrofurantoin (macrocrystal-monohydrate) 100 MG capsule Commonly known as: MACROBID Take 1 capsule (100 mg total) by mouth 2 (two) times daily for 5 days. Started by: Debroah Loop, PA-C   omeprazole 40 MG capsule Commonly known as: PRILOSEC Take 40 mg by mouth daily before breakfast.   PRESERVISION AREDS 2 PO Take 2 tablets by mouth  in the morning.   simvastatin 40 MG tablet Commonly known as: ZOCOR Take 40 mg by mouth at bedtime.   STRESS B COMPLEX PO Take 1 capsule by mouth in the morning.   traMADol 50 MG tablet Commonly known as: ULTRAM Take 1-2 tablets (50-100 mg total) by mouth every 6 (six) hours as needed for moderate pain or severe pain. What changed: Another medication with the same name was removed. Continue taking this medication, and follow the directions you see here. Changed by: Debroah Loop, PA-C   vitamin C 1000 MG tablet Take 1,000 mg by mouth in the morning.        Allergies:  Allergies  Allergen Reactions   Codeine     "felt drunk, crazy"   Hydrocodone-Acetaminophen Other (See Comments)    Confusion/delirium   Lidocaine     Heart racing, increased BP - from local at dentist (likely epi)   Nsaids Other (See Comments)    Avoids due to IBS symptoms (bleeding symptoms)   Oxycodone     Confusion/delirium    Family History: Family History  Problem Relation Age of Onset   Diabetes Mother    Heart disease Mother    Hypertension Mother    Diabetes Son    Diabetes Sister    Inflammatory bowel disease Father        diverticulitis   Multiple myeloma Father    Hypertension Son    Diabetes  Maternal Grandmother    Colon cancer Neg Hx    Esophageal cancer Neg Hx    Rectal cancer Neg Hx    Stomach cancer Neg Hx    Breast cancer Neg Hx     Social History:   reports that she has been smoking cigarettes. She has a 27.50 pack-year smoking history. She has never used smokeless tobacco. She reports current alcohol use of about 2.0 standard drinks of alcohol per week. She reports that she does not use drugs.  Physical Exam: BP 112/70   Pulse 82   Ht 5' 1"  (1.549 m)   Wt 98 lb (44.5 kg)   BMI 18.52 kg/m   Constitutional:  Alert and oriented, no acute distress, nontoxic appearing HEENT: [place], AT Cardiovascular: No clubbing, cyanosis, or edema Respiratory: Normal respiratory effort, no increased work of breathing Skin: No rashes, bruises or suspicious lesions Neurologic: Grossly intact, no focal deficits, moving all 4 extremities Psychiatric: Normal mood and affect  Laboratory Data: Results for orders placed or performed in visit on 01/26/22  Microscopic Examination   Urine  Result Value Ref Range   WBC, UA >30 (A) 0 - 5 /hpf   RBC 11-30 (A) 0 - 2 /hpf   Epithelial Cells (non renal) 0-10 0 - 10 /hpf   Bacteria, UA Moderate (A) None seen/Few  Urinalysis, Complete  Result Value Ref Range   Specific Gravity, UA 1.030 1.005 - 1.030   pH, UA 5.5 5.0 - 7.5   Color, UA Yellow Yellow   Appearance Ur Cloudy (A) Clear   Leukocytes,UA 1+ (A) Negative   Protein,UA 2+ (A) Negative/Trace   Glucose, UA Negative Negative   Ketones, UA Negative Negative   RBC, UA 2+ (A) Negative   Bilirubin, UA Negative Negative   Urobilinogen, Ur 0.2 0.2 - 1.0 mg/dL   Nitrite, UA Negative Negative   Microscopic Examination See below:    Assessment & Plan:   1. Dysuria UA appears grossly infected today, will start empiric Macrobid and send for culture for further evaluation.  We will repeat a UA in 1 week after completion of culture appropriate antibiotics.  Anticipate she may have some residual  microscopic hematuria, will defer repeat CT urogram until 2024 per Dr. Erlene Quan. - Urinalysis, Complete - CULTURE, URINE COMPREHENSIVE - nitrofurantoin, macrocrystal-monohydrate, (MACROBID) 100 MG capsule; Take 1 capsule (100 mg total) by mouth 2 (two) times daily for 5 days.  Dispense: 10 capsule; Refill: 0  2. Recurrent UTI We discussed the pathophysiology of recurrent UTIs in postmenopausal women today.  I encouraged her to start a prevention regimen today including cranberry supplements, d-mannose, and lactobacillus containing probiotics.  We also discussed resuming vaginal estrogen cream and continuing this indefinitely.  If this regimen fails, may consider daily suppressive antibiotics in the future.  She is in agreement with this plan.  Return in about 1 week (around 02/02/2022) for Lab visit for UA.  Debroah Loop, PA-C  [facility] [address], [place], [place] [address] [phone]

## 2022-01-26 NOTE — Patient Instructions (Signed)
Recurrent UTI Prevention Strategies  Resume taking an over-the-counter cranberry supplement for urinary tract health. Take this once or twice daily on an empty stomach, e.g. right before bed. Start taking an over-the-counter d-mannose supplement. Take this daily per packaging instructions. Start taking an over-the-counter probiotic containing the bacteria called Lactobacillus. Take this daily. Resume vaginal estrogen cream. Apply a pea-sized amount around the urethra every day for 2 weeks, then three times weekly forever.

## 2022-01-29 LAB — CULTURE, URINE COMPREHENSIVE

## 2022-02-01 ENCOUNTER — Other Ambulatory Visit: Payer: Self-pay

## 2022-02-01 DIAGNOSIS — N390 Urinary tract infection, site not specified: Secondary | ICD-10-CM

## 2022-02-02 ENCOUNTER — Other Ambulatory Visit: Payer: Medicare PPO

## 2022-02-02 DIAGNOSIS — N390 Urinary tract infection, site not specified: Secondary | ICD-10-CM

## 2022-02-02 LAB — MICROSCOPIC EXAMINATION

## 2022-02-02 LAB — URINALYSIS, COMPLETE
Bilirubin, UA: NEGATIVE
Glucose, UA: NEGATIVE
Ketones, UA: NEGATIVE
Leukocytes,UA: NEGATIVE
Nitrite, UA: NEGATIVE
Specific Gravity, UA: 1.02 (ref 1.005–1.030)
Urobilinogen, Ur: 1 mg/dL (ref 0.2–1.0)
pH, UA: 6.5 (ref 5.0–7.5)

## 2022-02-03 ENCOUNTER — Telehealth: Payer: Self-pay

## 2022-02-03 NOTE — Telephone Encounter (Signed)
Pt aware and verbalized understanding.  

## 2022-02-03 NOTE — Telephone Encounter (Signed)
UA improved compared to prior. She has some persistent microscopic hematuria as expected. Will defer repeat CTU until 2024.

## 2022-02-03 NOTE — Telephone Encounter (Signed)
Patient called asking for UA results

## 2022-02-08 ENCOUNTER — Encounter: Payer: Self-pay | Admitting: Urology

## 2022-02-08 ENCOUNTER — Ambulatory Visit: Payer: Medicare PPO | Admitting: Urology

## 2022-02-08 ENCOUNTER — Other Ambulatory Visit
Admission: RE | Admit: 2022-02-08 | Discharge: 2022-02-08 | Disposition: A | Discharge status: Home / Self Care | Payer: Medicare PPO | Source: Ambulatory Visit | Attending: Urology | Admitting: Urology

## 2022-02-08 ENCOUNTER — Other Ambulatory Visit: Payer: Self-pay

## 2022-02-08 VITALS — BP 128/78 | HR 74 | Ht 61.0 in | Wt 95.0 lb

## 2022-02-08 DIAGNOSIS — N390 Urinary tract infection, site not specified: Secondary | ICD-10-CM

## 2022-02-08 DIAGNOSIS — R3129 Other microscopic hematuria: Secondary | ICD-10-CM

## 2022-02-08 DIAGNOSIS — R300 Dysuria: Secondary | ICD-10-CM | POA: Insufficient documentation

## 2022-02-08 DIAGNOSIS — N952 Postmenopausal atrophic vaginitis: Secondary | ICD-10-CM

## 2022-02-08 LAB — URINALYSIS, COMPLETE (UACMP) WITH MICROSCOPIC
Bilirubin Urine: NEGATIVE
Glucose, UA: NEGATIVE mg/dL
Ketones, ur: NEGATIVE mg/dL
Nitrite: NEGATIVE
Protein, ur: 30 mg/dL — AB
Specific Gravity, Urine: >1.03 — ABNORMAL HIGH (ref 1.005–1.030)
Squamous Epithelial / HPF: NONE SEEN (ref 0–5)
WBC, UA: >50 WBC/hpf (ref 0–5)
pH: 5.5 (ref 5.0–8.0)

## 2022-02-08 MED ORDER — SULFAMETHOXAZOLE-TRIMETHOPRIM 800-160 MG PO TABS: 1.0000 | ORAL_TABLET | Freq: Two times a day (BID) | ORAL | 0 refills | Status: DC

## 2022-02-08 NOTE — Addendum Note (Signed)
Addended by: Alvera Novel on: 02/08/2022 11:11 AM   Modules accepted: Orders

## 2022-02-08 NOTE — Progress Notes (Signed)
02/08/22 11:39 AM   [name] July 06, 1946 [id number]  Referring provider:  Rusty Aus, MD [facility] [place],  [place] [address]  Chief Complaint  Patient presents with   Dysuria    Urological history  Hematuria  - benign cystoscopy in February 2023  2. rUTIs  - Urine culture on 01/26/2022 grew E.coli     E. Coli 12/29/2021  HPI: Yolanda Jimenez is a 76 y.o.female who presents today for further evaluation of dysuria.   She states for the last several days, she has been having intense dysuria with urination.  Patient denies any modifying or aggravating factors.  Patient denies any gross hematuria or suprapubic/flank pain.  Patient denies any fevers, chills, nausea or vomiting.    CATH UA > 50 WBC's, 11-20 RBC's, many bacteria, yeast present and CaOx crystals present.    She reports that she only used vaginal estrogen cream for a few weeks then stopped. She has a personal history of diarrhea and she uses Imodium.  She is taking cranberry tablets. She is not sexually active, she does not take baths, she does not swim. She wear panty liners for incontinence but she rarely has to change them.    PMH: Past Medical History:  Diagnosis Date   A-fib ([place])    Aortic atherosclerosis ([place])    Arthritis    back   Asthma    AVM (arteriovenous malformation)    Barrett esophagus    C. difficile diarrhea    Carotid stenosis    Complication of anesthesia    very hard to come out of anesthesia and pt states she gets very" loopy" after surgeries   COPD (chronic obstructive pulmonary disease) (HCC)    no inhalers   Depression    Diabetes mellitus without complication (HCC)    borderline   Diverticulosis    GERD (gastroesophageal reflux disease)    Heart murmur    Hx of adenomatous colonic polyps    Hyperlipidemia    IBS (irritable bowel syndrome)    Insomnia    Iron deficiency anemia    Kidney cysts    hx UTIs   Macular  degeneration    left eye   Microscopic hematuria    Osteoporosis    mild   PVD (peripheral vascular disease) ([place])    Wears dentures    partial lower    Surgical History: Past Surgical History:  Procedure Laterality Date   BUNIONECTOMY     right foot   CATARACT EXTRACTION     both eyes   CERVICAL CONE BIOPSY     CHOLECYSTECTOMY     COLONOSCOPY     COLONOSCOPY WITH PROPOFOL N/A 01/22/2019   Procedure: COLONOSCOPY WITH BIOPSY;  Surgeon: Lucilla Lame, MD;  Location: [place];  Service: Endoscopy;  Laterality: N/A;  diabetic - diet controlled   COLONOSCOPY WITH PROPOFOL N/A 06/27/2020   Procedure: COLONOSCOPY WITH BIOPSY;  Surgeon: Lucilla Lame, MD;  Location: [place];  Service: Endoscopy;  Laterality: N/A;  priority 4   ESOPHAGOGASTRODUODENOSCOPY (EGD) WITH PROPOFOL N/A 08/29/2017   Procedure: ESOPHAGOGASTRODUODENOSCOPY (EGD) WITH PROPOFOL;  Surgeon: Lucilla Lame, MD;  Location: [place];  Service: Endoscopy;  Laterality: N/A;   FOOT SURGERY     right 2nd toe   HIP ARTHROPLASTY Left 05/16/2018   Procedure: ARTHROPLASTY BIPOLAR HIP (HEMIARTHROPLASTY);  Surgeon: Corky Mull, MD;  Location: [facility];  Service: Orthopedics;  Laterality: Left;   ORIF PERIPROSTHETIC  FRACTURE Left 12/10/2021   Procedure: OPEN REDUCTION INTERNAL FIXATION OF A LEFT GREATER TROCHANTERIC FRACTURE;  Surgeon: Corky Mull, MD;  Location: [facility];  Service: Orthopedics;  Laterality: Left;   ORIF WRIST FRACTURE Right 10/07/2020   Procedure: OPEN REDUCTION INTERNAL FIXATION (ORIF) WRIST FRACTURE;  Surgeon: Hessie Knows, MD;  Location: [facility];  Service: Orthopedics;  Laterality: Right;   POLYPECTOMY N/A 01/22/2019   Procedure: POLYPECTOMY;  Surgeon: Lucilla Lame, MD;  Location: [place];  Service: Endoscopy;  Laterality: N/A;  2 Clips placed at polyp removal site in ascending colon   POLYPECTOMY N/A 06/27/2020   Procedure: POLYPECTOMY;  Surgeon: Lucilla Lame, MD;   Location: [place];  Service: Endoscopy;  Laterality: N/A;   TUBAL LIGATION     UPPER GASTROINTESTINAL ENDOSCOPY      Home Medications:  Allergies as of 02/08/2022       Reactions   Codeine    "felt drunk, crazy"   Hydrocodone-acetaminophen Other (See Comments)   Confusion/delirium   Lidocaine    Heart racing, increased BP - from local at dentist (likely epi)   Nsaids Other (See Comments)   Avoids due to IBS symptoms (bleeding symptoms)   Oxycodone    Confusion/delirium        Medication List        Accurate as of February 08, 2022 11:39 AM. If you have any questions, ask your nurse or doctor.          STOP taking these medications    enoxaparin 40 MG/0.4ML injection Commonly known as: LOVENOX Stopped by: Elsworth Ledin, PA-C   estradiol 0.1 MG/GM vaginal cream Commonly known as: ESTRACE Stopped by: Zara Council, PA-C       TAKE these medications    acetaminophen 500 MG tablet Commonly known as: TYLENOL Take 1,000 mg by mouth every 6 (six) hours as needed.   BRAINSTRONG MEMORY SUPPORT PO Take 2 capsules by mouth 3 (three) times a week. [place]   CALCIUM 600+D3 PO Take 1 tablet by mouth in the morning.   celecoxib 200 MG capsule Commonly known as: CELEBREX Take by mouth.   Cholecalciferol 50 MCG (2000 UT) Caps Take 2,000 Units by mouth in the morning.   CRANBERRY-VITAMIN C PO Take 1 tablet by mouth 3 (three) times a week.   diazepam 5 MG tablet Commonly known as: VALIUM Take 1 tablet (5 mg total) by mouth daily as needed for anxiety. What changed: when to take this   diltiazem 30 MG tablet Commonly known as: CARDIZEM Take 30 mg by mouth daily before breakfast.   ferrous gluconate 240 (27 FE) MG tablet Commonly known as: FERGON Take 240 mg by mouth in the morning.   fluticasone 50 MCG/ACT nasal spray Commonly known as: FLONASE Place 1 spray into both nostrils daily as needed for allergies  or rhinitis.   loperamide 2 MG capsule Commonly known as: IMODIUM Take 2 mg by mouth every morning.   omeprazole 40 MG capsule Commonly known as: PRILOSEC Take 40 mg by mouth daily before breakfast.   PRESERVISION AREDS 2 PO Take 2 tablets by mouth in the morning.   simvastatin 40 MG tablet Commonly known as: ZOCOR Take 40 mg by mouth at bedtime.   STRESS B COMPLEX PO Take 1 capsule by mouth in the morning.   sulfamethoxazole-trimethoprim 800-160 MG tablet Commonly known as: BACTRIM DS Take 1 tablet by mouth every 12 (twelve) hours. Started by: Larene Beach  Anan Dapolito, PA-C   traMADol 50 MG tablet Commonly known as: ULTRAM Take 1-2 tablets (50-100 mg total) by mouth every 6 (six) hours as needed for moderate pain or severe pain.   vitamin C 1000 MG tablet Take 1,000 mg by mouth in the morning.        Allergies:  Allergies  Allergen Reactions   Codeine     "felt drunk, crazy"   Hydrocodone-Acetaminophen Other (See Comments)    Confusion/delirium   Lidocaine     Heart racing, increased BP - from local at dentist (likely epi)   Nsaids Other (See Comments)    Avoids due to IBS symptoms (bleeding symptoms)   Oxycodone     Confusion/delirium    Family History: Family History  Problem Relation Age of Onset   Diabetes Mother    Heart disease Mother    Hypertension Mother    Diabetes Son    Diabetes Sister    Inflammatory bowel disease Father        diverticulitis   Multiple myeloma Father    Hypertension Son    Diabetes Maternal Grandmother    Colon cancer Neg Hx    Esophageal cancer Neg Hx    Rectal cancer Neg Hx    Stomach cancer Neg Hx    Breast cancer Neg Hx     Social History:  reports that she has been smoking cigarettes. She has a 27.50 pack-year smoking history. She has never used smokeless tobacco. She reports current alcohol use of about 2.0 standard drinks of alcohol per week. She reports that she does not use drugs.   Physical Exam: BP 128/78    Pulse 74   Ht 5' 1"  (1.549 m)   Wt 95 lb (43.1 kg)   BMI 17.95 kg/m   Constitutional:  Alert and oriented, No acute distress. HEENT: [place] AT, moist mucus membranes.  Trachea midline Cardiovascular: No clubbing, cyanosis, or edema. Respiratory: Normal respiratory effort, no increased work of breathing. GI: Abdomen is soft, nontender, nondistended, no abdominal masses Neurologic: Grossly intact, no focal deficits, moving all 4 extremities. Psychiatric: Normal mood and affect.  Laboratory Data: Lab Results  Component Value Date   CREATININE 0.67 12/12/2021   Component     Latest Ref Rng 12/11/2021 12/12/2021  Sodium     135 - 145 mmol/L 137  136   Potassium     3.5 - 5.1 mmol/L 3.5  3.7   Chloride     98 - 111 mmol/L 106  104   CO2     22 - 32 mmol/L 22  26   Glucose     70 - 99 mg/dL 112 (H)  123 (H)   BUN     8 - 23 mg/dL 9  11   Creatinine     0.44 - 1.00 mg/dL 0.65  0.67   Calcium     8.9 - 10.3 mg/dL 8.0 (L)  8.3 (L)   Anion gap     5 - 15  9  6    GFR, Estimated     >60 mL/min >60  >60        Latest Ref Rng & Units 12/12/2021   11:50 PM 12/12/2021    4:10 AM 12/11/2021    6:15 AM  CBC  WBC 4.0 - 10.5 K/uL 7.8  8.1  8.8   Hemoglobin 12.0 - 15.0 g/dL 9.2  9.2  9.8   Hematocrit 36.0 - 46.0 % 28.3  27.8  30.5  Platelets 150 - 400 K/uL 317  287  307      Urinalysis Results for orders placed or performed during the hospital encounter of 02/08/22  Urinalysis, Complete w Microscopic  Result Value Ref Range   Color, Urine YELLOW YELLOW   APPearance HAZY (A) CLEAR   Specific Gravity, Urine >1.030 (H) 1.005 - 1.030   pH 5.5 5.0 - 8.0   Glucose, UA NEGATIVE NEGATIVE mg/dL   Hgb urine dipstick MODERATE (A) NEGATIVE   Bilirubin Urine NEGATIVE NEGATIVE   Ketones, ur NEGATIVE NEGATIVE mg/dL   Protein, ur 30 (A) NEGATIVE mg/dL   Nitrite NEGATIVE NEGATIVE   Leukocytes,Ua SMALL (A) NEGATIVE   Squamous Epithelial / LPF NONE SEEN 0 - 5   WBC, UA >50 0 - 5 WBC/hpf   RBC / HPF  11-20 0 - 5 RBC/hpf   Bacteria, UA MANY (A) NONE SEEN   Hyphae Yeast PRESENT    Ca Oxalate Crys, UA PRESENT   I have reviewed the labs.   Assessment & Plan:   rUTIs - CATH UA has many bacteria, 11-20 Rbs, >50 WBCs, and moderate Hgb. Will treat with bactrim in the interim  - Encouraged her to continue vaginal estrogen cream - Counseled her in UTI prevention supplements such as cranberry tablets, probiotics, yogurt that has active lactobacillus culture, and d-mannose   2. Vaginal atrophy -continue vaginal estrogen cream three nights weekly  3. Microscopic hematuria - 11-20 RBC's on today's UA, but likely due to infection -will repeat UA in one month to ensure micro heme clears   Return in about 1 month (around 03/11/2022) for UA and symptom recheck .  [facility] [address], [place], [place] [address] [phone]  I, Kirke Shaggy Littlejohn,acting as a scribe for [facility], PA-C.,have documented all relevant documentation on the behalf of Brandalynn Ofallon, PA-C,as directed by  [facility], PA-C while in the presence of [place], PA-C.  I have reviewed the above documentation for accuracy and completeness, and I agree with the above.    Zara Council, PA-C

## 2022-02-08 NOTE — Patient Instructions (Addendum)
D-mannose   Probiotics to have the ingredients Lactobacillus acidophilus, Lactobacillus casei, lactobacillus bulgaricus and streptococcus thermophiles.     1/2 gallon water daily   Estradiol cream to be applied Monday, Wednesday and Friday

## 2022-02-11 ENCOUNTER — Telehealth: Payer: Self-pay

## 2022-02-11 LAB — URINE CULTURE: Culture: >=100000 — AB

## 2022-02-11 NOTE — Telephone Encounter (Signed)
-----   Message from Nori Riis, PA-C sent at 02/11/2022  8:06 AM EDT ----- Please let Mrs. Carreras know that her urine culture was positive for infection and the antibiotic that I prescribed is an appropriate antibiotic.  She needs to take all the antibiotics and we will see her on 03/22/2022.

## 2022-02-11 NOTE — Telephone Encounter (Signed)
Patient notified

## 2022-03-10 ENCOUNTER — Ambulatory Visit: Payer: Medicare PPO | Admitting: Urology

## 2022-03-10 ENCOUNTER — Encounter: Payer: Self-pay | Admitting: Urology

## 2022-03-10 VITALS — BP 132/88 | HR 88 | Ht 61.0 in | Wt 95.0 lb

## 2022-03-10 DIAGNOSIS — N390 Urinary tract infection, site not specified: Secondary | ICD-10-CM

## 2022-03-10 DIAGNOSIS — N3000 Acute cystitis without hematuria: Secondary | ICD-10-CM

## 2022-03-10 LAB — URINALYSIS, COMPLETE
Bilirubin, UA: NEGATIVE
Ketones, UA: NEGATIVE
Nitrite, UA: NEGATIVE
Specific Gravity, UA: 1.03 (ref 1.005–1.030)
Urobilinogen, Ur: 0.2 mg/dL (ref 0.2–1.0)
pH, UA: 5 (ref 5.0–7.5)

## 2022-03-10 LAB — MICROSCOPIC EXAMINATION
RBC, Urine: >30 /hpf — AB (ref 0–2)
WBC, UA: >30 /hpf — AB (ref 0–5)

## 2022-03-10 MED ORDER — SULFAMETHOXAZOLE-TRIMETHOPRIM 800-160 MG PO TABS: 1.0000 | ORAL_TABLET | Freq: Two times a day (BID) | ORAL | 0 refills | Status: DC

## 2022-03-10 MED ORDER — TRIMETHOPRIM 100 MG PO TABS: ORAL_TABLET | ORAL | 0 refills | Status: DC

## 2022-03-10 NOTE — Progress Notes (Signed)
8 /2/23 11:26 AM   Yolanda Jimenez 12-May-1946 [id number]  Referring provider:  Rusty Aus, MD [facility] [place],  [place] [address] Chief Complaint  Patient presents with   Urinary Tract Infection     HPI: Yolanda Jimenez is a 76 y.o.female a with personal history of hematuria, and recurrent UTIs who presents today for further evaluation of UTI symptoms.   She had a benign cystoscopy in 09/2021.   She was recently seen in clinic by Zara Council, PA-C on 02/08/2022 She was noted to have.This area with urination for several days.Cath UA had > 50 WBCS, 11- 20 RBCS, many bacteria, yeast present and calcium oxalate crystals present. Urine culture grew Escherichia coli That was resistant to ampicillin and sulbactam and otherwise pan sensitive. She was treated with Bactrim.   She has had two previous UTI's this year one on 12/29/2021 that grew E coli and another on 01/26/2022 that grew E coli.   She is managed on vaginal estrogen cream three nights weekly.   She reports that she has been under some stress. She had a fall and her ribs are bruised, she is having issues with her hip, and her sister is very sick.   Her symptoms started 2 days ago.  She reports burning, urgency frequency.  No fevers or chills.  No gross hematuria.  UA today has >30 RBCs, >30 WBCs.   PMH: Past Medical History:  Diagnosis Date   A-fib ([place])    Aortic atherosclerosis ([place])    Arthritis    back   Asthma    AVM (arteriovenous malformation)    Barrett esophagus    C. difficile diarrhea    Carotid stenosis    Complication of anesthesia    very hard to come out of anesthesia and pt states she gets very" loopy" after surgeries   COPD (chronic obstructive pulmonary disease) (HCC)    no inhalers   Depression    Diabetes mellitus without complication (HCC)    borderline   Diverticulosis    GERD (gastroesophageal reflux disease)    Heart murmur    Hx of  adenomatous colonic polyps    Hyperlipidemia    IBS (irritable bowel syndrome)    Insomnia    Iron deficiency anemia    Kidney cysts    hx UTIs   Macular degeneration    left eye   Microscopic hematuria    Osteoporosis    mild   PVD (peripheral vascular disease) ([place])    Wears dentures    partial lower    Surgical History: Past Surgical History:  Procedure Laterality Date   BUNIONECTOMY     right foot   CATARACT EXTRACTION     both eyes   CERVICAL CONE BIOPSY     CHOLECYSTECTOMY     COLONOSCOPY     COLONOSCOPY WITH PROPOFOL N/A 01/22/2019   Procedure: COLONOSCOPY WITH BIOPSY;  Surgeon: Lucilla Lame, MD;  Location: [facility];  Service: Endoscopy;  Laterality: N/A;  diabetic - diet controlled   COLONOSCOPY WITH PROPOFOL N/A 06/27/2020   Procedure: COLONOSCOPY WITH BIOPSY;  Surgeon: Lucilla Lame, MD;  Location: [place];  Service: Endoscopy;  Laterality: N/A;  priority 4   ESOPHAGOGASTRODUODENOSCOPY (EGD) WITH PROPOFOL N/A 08/29/2017   Procedure: ESOPHAGOGASTRODUODENOSCOPY (EGD) WITH PROPOFOL;  Surgeon: Lucilla Lame, MD;  Location: [facility];  Service: Endoscopy;  Laterality: N/A;   FOOT SURGERY     right 2nd  toe   HIP ARTHROPLASTY Left 05/16/2018   Procedure: ARTHROPLASTY BIPOLAR HIP (HEMIARTHROPLASTY);  Surgeon: Corky Mull, MD;  Location: [facility];  Service: Orthopedics;  Laterality: Left;   ORIF PERIPROSTHETIC FRACTURE Left 12/10/2021   Procedure: OPEN REDUCTION INTERNAL FIXATION OF A LEFT GREATER TROCHANTERIC FRACTURE;  Surgeon: Corky Mull, MD;  Location: [facility];  Service: Orthopedics;  Laterality: Left;   ORIF WRIST FRACTURE Right 10/07/2020   Procedure: OPEN REDUCTION INTERNAL FIXATION (ORIF) WRIST FRACTURE;  Surgeon: Hessie Knows, MD;  Location: [facility];  Service: Orthopedics;  Laterality: Right;   POLYPECTOMY N/A 01/22/2019   Procedure: POLYPECTOMY;  Surgeon: Lucilla Lame, MD;  Location: [facility];  Service: Endoscopy;   Laterality: N/A;  2 Clips placed at polyp removal site in ascending colon   POLYPECTOMY N/A 06/27/2020   Procedure: POLYPECTOMY;  Surgeon: Lucilla Lame, MD;  Location: [place];  Service: Endoscopy;  Laterality: N/A;   TUBAL LIGATION     UPPER GASTROINTESTINAL ENDOSCOPY      Home Medications:  Allergies as of 03/10/2022       Reactions   Codeine    "felt drunk, crazy"   Hydrocodone-acetaminophen Other (See Comments)   Confusion/delirium   Lidocaine    Heart racing, increased BP - from local at dentist (likely epi)   Nsaids Other (See Comments)   Avoids due to IBS symptoms (bleeding symptoms)   Oxycodone    Confusion/delirium        Medication List        Accurate as of March 10, 2022 11:59 PM. If you have any questions, ask your nurse or doctor.          acetaminophen 500 MG tablet Commonly known as: TYLENOL Take 1,000 mg by mouth every 6 (six) hours as needed.   BRAINSTRONG MEMORY SUPPORT PO Take 2 capsules by mouth 3 (three) times a week. [place]   CALCIUM 600+D3 PO Take 1 tablet by mouth in the morning.   celecoxib 200 MG capsule Commonly known as: CELEBREX Take by mouth.   Cholecalciferol 50 MCG (2000 UT) Caps Take 2,000 Units by mouth in the morning.   CRANBERRY-VITAMIN C PO Take 1 tablet by mouth 3 (three) times a week.   diazepam 5 MG tablet Commonly known as: VALIUM Take 1 tablet (5 mg total) by mouth daily as needed for anxiety. What changed: when to take this   diltiazem 30 MG tablet Commonly known as: CARDIZEM Take 30 mg by mouth daily before breakfast.   ferrous gluconate 240 (27 FE) MG tablet Commonly known as: FERGON Take 240 mg by mouth in the morning.   fluticasone 50 MCG/ACT nasal spray Commonly known as: FLONASE Place 1 spray into both nostrils daily as needed for allergies or rhinitis.   loperamide 2 MG capsule Commonly known as: IMODIUM Take 2 mg by mouth every morning.    omeprazole 40 MG capsule Commonly known as: PRILOSEC Take 40 mg by mouth daily before breakfast.   PRESERVISION AREDS 2 PO Take 2 tablets by mouth in the morning.   simvastatin 40 MG tablet Commonly known as: ZOCOR Take 40 mg by mouth at bedtime.   STRESS B COMPLEX PO Take 1 capsule by mouth in the morning.   sulfamethoxazole-trimethoprim 800-160 MG tablet Commonly known as: BACTRIM DS Take 1 tablet by mouth 2 (two) times daily. What changed: when to take this Changed by: Hollice Espy, MD   traMADol 50 MG tablet  Commonly known as: ULTRAM Take 1-2 tablets (50-100 mg total) by mouth every 6 (six) hours as needed for moderate pain or severe pain.   trimethoprim 100 MG tablet Commonly known as: TRIMPEX Take 1 tablet daily after completing Bactrim Started by: Hollice Espy, MD   vitamin C 1000 MG tablet Take 1,000 mg by mouth in the morning.        Allergies:  Allergies  Allergen Reactions   Codeine     "felt drunk, crazy"   Hydrocodone-Acetaminophen Other (See Comments)    Confusion/delirium   Lidocaine     Heart racing, increased BP - from local at dentist (likely epi)   Nsaids Other (See Comments)    Avoids due to IBS symptoms (bleeding symptoms)   Oxycodone     Confusion/delirium    Family History: Family History  Problem Relation Age of Onset   Diabetes Mother    Heart disease Mother    Hypertension Mother    Diabetes Son    Diabetes Sister    Inflammatory bowel disease Father        diverticulitis   Multiple myeloma Father    Hypertension Son    Diabetes Maternal Grandmother    Colon cancer Neg Hx    Esophageal cancer Neg Hx    Rectal cancer Neg Hx    Stomach cancer Neg Hx    Breast cancer Neg Hx     Social History:  reports that she has been smoking cigarettes. She has a 27.50 pack-year smoking history. She has never used smokeless tobacco. She reports current alcohol use of about 2.0 standard drinks of alcohol per week. She reports that  she does not use drugs.   Physical Exam: BP 132/88   Pulse 88   Ht 5' 1"  (1.549 m)   Wt 95 lb (43.1 kg)   BMI 17.95 kg/m   Constitutional:  Alert and oriented, No acute distress. HEENT: [place] AT, moist mucus membranes.  Trachea midline, no masses. Cardiovascular: No clubbing, cyanosis, or edema. Respiratory: Normal respiratory effort, no increased work of breathing. Skin: No rashes, bruises or suspicious lesions. Neurologic: Grossly intact, no focal deficits, moving all 4 extremities. Psychiatric: Normal mood and affect.  Laboratory Data:  Lab Results  Component Value Date   CREATININE 0.67 12/12/2021    Urinalysis >30 WBC and RBCs   Assessment & Plan:   rUTIs/ acute cystitis  - UA grossly positive today  - Will send for culture and will teat her in the interim with Bactrim  - Complete course of bactrim and start her on suppressive trimethoprim for 6 months; discussed the potential risk of this along with benefits.  She would like to proceed with this. -  Continue UTI prevention supplements such as cranberry tablets, probiotics, yogurt that has active lactobacillus culture, and d-mannose     Guy Sandifer Jamea Robicheaux,acting as a scribe for Hollice Espy, MD.,have documented all relevant documentation on the behalf of Hollice Espy, MD,as directed by  Hollice Espy, MD while in the presence of Hollice Espy, MD.  I have reviewed the above documentation for accuracy and completeness, and I agree with the above.   Hollice Espy, MD    [facility] [address], [place], [place] [address] [phone]

## 2022-03-14 LAB — CULTURE, URINE COMPREHENSIVE

## 2022-03-22 ENCOUNTER — Ambulatory Visit: Payer: Medicare PPO | Admitting: Urology

## 2022-06-07 ENCOUNTER — Encounter (INDEPENDENT_AMBULATORY_CARE_PROVIDER_SITE_OTHER): Payer: Self-pay

## 2022-08-10 DIAGNOSIS — J101 Influenza due to other identified influenza virus with other respiratory manifestations: Secondary | ICD-10-CM | POA: Diagnosis not present

## 2022-08-10 DIAGNOSIS — Z03818 Encounter for observation for suspected exposure to other biological agents ruled out: Secondary | ICD-10-CM | POA: Diagnosis not present

## 2022-08-10 DIAGNOSIS — Z8709 Personal history of other diseases of the respiratory system: Secondary | ICD-10-CM | POA: Diagnosis not present

## 2022-08-16 DIAGNOSIS — N3090 Cystitis, unspecified without hematuria: Secondary | ICD-10-CM | POA: Diagnosis not present

## 2022-08-16 DIAGNOSIS — E1151 Type 2 diabetes mellitus with diabetic peripheral angiopathy without gangrene: Secondary | ICD-10-CM | POA: Diagnosis not present

## 2022-08-16 DIAGNOSIS — F330 Major depressive disorder, recurrent, mild: Secondary | ICD-10-CM | POA: Diagnosis not present

## 2022-08-16 DIAGNOSIS — J431 Panlobular emphysema: Secondary | ICD-10-CM | POA: Diagnosis not present

## 2022-08-16 DIAGNOSIS — E1165 Type 2 diabetes mellitus with hyperglycemia: Secondary | ICD-10-CM | POA: Diagnosis not present

## 2022-08-19 DIAGNOSIS — M818 Other osteoporosis without current pathological fracture: Secondary | ICD-10-CM | POA: Diagnosis not present

## 2022-08-20 DIAGNOSIS — H43813 Vitreous degeneration, bilateral: Secondary | ICD-10-CM | POA: Diagnosis not present

## 2022-08-20 DIAGNOSIS — Z961 Presence of intraocular lens: Secondary | ICD-10-CM | POA: Diagnosis not present

## 2022-08-20 DIAGNOSIS — E119 Type 2 diabetes mellitus without complications: Secondary | ICD-10-CM | POA: Diagnosis not present

## 2022-09-06 DIAGNOSIS — H6063 Unspecified chronic otitis externa, bilateral: Secondary | ICD-10-CM | POA: Diagnosis not present

## 2022-09-06 DIAGNOSIS — R42 Dizziness and giddiness: Secondary | ICD-10-CM | POA: Diagnosis not present

## 2022-09-06 DIAGNOSIS — H903 Sensorineural hearing loss, bilateral: Secondary | ICD-10-CM | POA: Diagnosis not present

## 2022-09-06 DIAGNOSIS — H8111 Benign paroxysmal vertigo, right ear: Secondary | ICD-10-CM | POA: Diagnosis not present

## 2022-09-06 DIAGNOSIS — H6123 Impacted cerumen, bilateral: Secondary | ICD-10-CM | POA: Diagnosis not present

## 2022-09-06 DIAGNOSIS — H9311 Tinnitus, right ear: Secondary | ICD-10-CM | POA: Diagnosis not present

## 2022-09-10 ENCOUNTER — Ambulatory Visit: Payer: Medicare PPO | Admitting: Physician Assistant

## 2022-09-13 DIAGNOSIS — H8111 Benign paroxysmal vertigo, right ear: Secondary | ICD-10-CM | POA: Diagnosis not present

## 2022-09-15 DIAGNOSIS — S72112D Displaced fracture of greater trochanter of left femur, subsequent encounter for closed fracture with routine healing: Secondary | ICD-10-CM | POA: Diagnosis not present

## 2022-09-15 DIAGNOSIS — M79605 Pain in left leg: Secondary | ICD-10-CM | POA: Diagnosis not present

## 2022-09-15 DIAGNOSIS — Z96649 Presence of unspecified artificial hip joint: Secondary | ICD-10-CM | POA: Diagnosis not present

## 2022-09-15 DIAGNOSIS — M47816 Spondylosis without myelopathy or radiculopathy, lumbar region: Secondary | ICD-10-CM | POA: Diagnosis not present

## 2022-09-17 ENCOUNTER — Ambulatory Visit: Payer: PPO | Admitting: Physician Assistant

## 2022-09-17 ENCOUNTER — Encounter: Payer: Self-pay | Admitting: Physician Assistant

## 2022-09-17 VITALS — BP 125/78 | HR 79 | Ht 61.0 in | Wt 95.0 lb

## 2022-09-17 DIAGNOSIS — R351 Nocturia: Secondary | ICD-10-CM | POA: Diagnosis not present

## 2022-09-17 DIAGNOSIS — R350 Frequency of micturition: Secondary | ICD-10-CM | POA: Diagnosis not present

## 2022-09-17 LAB — BLADDER SCAN AMB NON-IMAGING

## 2022-09-17 LAB — MICROSCOPIC EXAMINATION

## 2022-09-17 LAB — URINALYSIS, COMPLETE
Bilirubin, UA: NEGATIVE
Glucose, UA: NEGATIVE
Ketones, UA: NEGATIVE
Leukocytes,UA: NEGATIVE
Nitrite, UA: NEGATIVE
Protein,UA: NEGATIVE
Specific Gravity, UA: 1.025 (ref 1.005–1.030)
Urobilinogen, Ur: 0.2 mg/dL (ref 0.2–1.0)
pH, UA: 5 (ref 5.0–7.5)

## 2022-09-17 MED ORDER — MIRABEGRON ER 25 MG PO TB24: 25.0000 mg | ORAL_TABLET | Freq: Every day | ORAL | 0 refills | Status: DC

## 2022-09-17 NOTE — Progress Notes (Unsigned)
09/17/2022 1:05 PM   MAEBY LUHRSEN 23-Aug-1945 [id number]  CC: Chief Complaint  Patient presents with   Urinary Frequency   HPI: Yolanda Jimenez is a 77 y.o. female with PMH hematuria with benign cystoscopy in February 2023, left renal cyst, recurrent UTIs, and LUTS including urgency, frequency, and weak stream who presents today for dilation of nocturia.   Today she reports a 1 to 28-monthhistory of nocturia x 3-5.  She reports daytime frequency x 2-3 and mild stress incontinence, for which she wears panty liners.  She denies dysuria, urgency, or urge incontinence today.  She notes that her nighttime urinations seem to have a large volume.  She admits to some recent stress due to caretaking her sister.  She is unsure if she is a snorer, but has never had a sleep study.  She admits to daytime sleepiness.  In-office UA today positive for 2+ blood; urine microscopy with 11-30 RBCs/HPF. PVR 034m  PMH: Past Medical History:  Diagnosis Date   A-fib ([facility]   Aortic atherosclerosis ([facility]   Arthritis    back   Asthma    AVM (arteriovenous malformation)    Barrett esophagus    C. difficile diarrhea    Carotid stenosis    Complication of anesthesia    very hard to come out of anesthesia and pt states she gets very" loopy" after surgeries   COPD (chronic obstructive pulmonary disease) (HCC)    no inhalers   Depression    Diabetes mellitus without complication (HCC)    borderline   Diverticulosis    GERD (gastroesophageal reflux disease)    Heart murmur    Hx of adenomatous colonic polyps    Hyperlipidemia    IBS (irritable bowel syndrome)    Insomnia    Iron deficiency anemia    Kidney cysts    hx UTIs   Macular degeneration    left eye   Microscopic hematuria    Osteoporosis    mild   PVD (peripheral vascular disease) ([facility]   Wears dentures    partial lower    Surgical History: Past Surgical History:  Procedure Laterality Date   BUNIONECTOMY     right foot    CATARACT EXTRACTION     both eyes   CERVICAL CONE BIOPSY     CHOLECYSTECTOMY     COLONOSCOPY     COLONOSCOPY WITH PROPOFOL N/A 01/22/2019   Procedure: COLONOSCOPY WITH BIOPSY;  Surgeon: WoLucilla LameMD;  [place] [facility] [facility];  Laterality: N/A;  diabetic - diet controlled   COLONOSCOPY WITH PROPOFOL N/A 06/27/2020   Procedure: COLONOSCOPY WITH BIOPSY;  Surgeon: WoLucilla LameMD;  [place] [facility] [facility] [facility]: [place];  Laterality: N/A;  priority 4   ESOPHAGOGASTRODUODENOSCOPY (EGD) WITH PROPOFOL N/A 08/29/2017   Procedure: ESOPHAGOGASTRODUODENOSCOPY (EGD) WITH PROPOFOL;  Surgeon: WoLucilla LameMD;  [place] [facility];  Laterality: N/A;   FOOT SURGERY     right 2nd toe   HIP ARTHROPLASTY Left 05/16/2018   Procedure: ARTHROPLASTY BIPOLAR HIP (HEMIARTHROPLASTY);  Surgeon: PoCorky MullMD;  Location: [facility];  Service: Orthopedics;  Laterality: Left;   ORIF PERIPROSTHETIC FRACTURE Left 12/10/2021   Procedure: OPEN REDUCTION INTERNAL FIXATION OF A LEFT GREATER TROCHANTERIC FRACTURE;  Surgeon: PoCorky MullMD;  Location: [facility];  Service: Orthopedics;  Laterality: Left;   ORIF WRIST FRACTURE Right 10/07/2020   Procedure: OPEN REDUCTION INTERNAL FIXATION (ORIF) WRIST FRACTURE;  Surgeon: Hessie Knows, MD;  Location: [facility];  Service: Orthopedics;  Laterality: Right;   POLYPECTOMY N/A 01/22/2019   Procedure: POLYPECTOMY;  Surgeon: Lucilla Lame, MD;  Location: [place];  Service: Endoscopy;  Laterality: N/A;  2 Clips placed at polyp removal site in ascending colon   POLYPECTOMY N/A 06/27/2020   Procedure: POLYPECTOMY;  Surgeon: Lucilla Lame, MD;  Location: [place];  Service: Endoscopy;  Laterality: N/A;   TUBAL LIGATION     UPPER GASTROINTESTINAL ENDOSCOPY      Home Medications:  Allergies as of 09/17/2022       Reactions   Codeine    "felt drunk, crazy"   Hydrocodone-acetaminophen Other (See  Comments)   Confusion/delirium   Lidocaine    Heart racing, increased BP - from local at dentist (likely epi)   Nsaids Other (See Comments)   Avoids due to IBS symptoms (bleeding symptoms)   Oxycodone    Confusion/delirium        Medication List        Accurate as of September 17, 2022  1:05 PM. If you have any questions, ask your nurse or doctor.          STOP taking these medications    sulfamethoxazole-trimethoprim 800-160 MG tablet Commonly known as: BACTRIM DS Stopped by: Debroah Loop, PA-C       TAKE these medications    acetaminophen 500 MG tablet Commonly known as: TYLENOL Take 1,000 mg by mouth every 6 (six) hours as needed.   BRAINSTRONG MEMORY SUPPORT PO Take 2 capsules by mouth 3 (three) times a week. [place]   CALCIUM 600+D3 PO Take 1 tablet by mouth in the morning.   celecoxib 200 MG capsule Commonly known as: CELEBREX Take by mouth.   Cholecalciferol 50 MCG (2000 UT) Caps Take 2,000 Units by mouth in the morning.   CRANBERRY-VITAMIN C PO Take 1 tablet by mouth 3 (three) times a week.   diazepam 5 MG tablet Commonly known as: VALIUM Take 1 tablet (5 mg total) by mouth daily as needed for anxiety. What changed: when to take this   diltiazem 30 MG tablet Commonly known as: CARDIZEM Take 30 mg by mouth daily before breakfast.   ferrous gluconate 240 (27 FE) MG tablet Commonly known as: FERGON Take 240 mg by mouth in the morning.   fluticasone 50 MCG/ACT nasal spray Commonly known as: FLONASE Place 1 spray into both nostrils daily as needed for allergies or rhinitis.   ipratropium 0.03 % nasal spray Commonly known as: ATROVENT SMARTSIG:1-2 Spray(s) Both Nares 3 Times Daily PRN   ipratropium 0.06 % nasal spray Commonly known as: ATROVENT Place into the nose.   loperamide 2 MG capsule Commonly known as: IMODIUM Take 2 mg by mouth every morning.   meclizine 12.5 MG tablet Commonly  known as: ANTIVERT Take 12.5 mg by mouth 3 (three) times daily as needed for dizziness.   mirabegron ER 25 MG Tb24 tablet Commonly known as: MYRBETRIQ Take 1 tablet (25 mg total) by mouth daily. Started by: Debroah Loop, PA-C   omeprazole 40 MG capsule Commonly known as: PRILOSEC Take 40 mg by mouth daily before breakfast.   PRESERVISION AREDS 2 PO Take 2 tablets by mouth in the morning.   Prolia 60 MG/ML Sosy injection Generic drug: denosumab Inject 60 mg into the skin every 6 (six) months.   simvastatin 40 MG tablet Commonly known as: ZOCOR Take 40 mg by mouth  at bedtime.   STRESS B COMPLEX PO Take 1 capsule by mouth in the morning.   traMADol 50 MG tablet Commonly known as: ULTRAM Take 1-2 tablets (50-100 mg total) by mouth every 6 (six) hours as needed for moderate pain or severe pain.   trimethoprim 100 MG tablet Commonly known as: TRIMPEX Take 1 tablet daily after completing Bactrim   venlafaxine XR 37.5 MG 24 hr capsule Commonly known as: EFFEXOR-XR Take 37.5 mg by mouth daily.   vitamin C 1000 MG tablet Take 1,000 mg by mouth in the morning.        Allergies:  Allergies  Allergen Reactions   Codeine     "felt drunk, crazy"   Hydrocodone-Acetaminophen Other (See Comments)    Confusion/delirium   Lidocaine     Heart racing, increased BP - from local at dentist (likely epi)   Nsaids Other (See Comments)    Avoids due to IBS symptoms (bleeding symptoms)   Oxycodone     Confusion/delirium    Family History: Family History  Problem Relation Age of Onset   Diabetes Mother    Heart disease Mother    Hypertension Mother    Diabetes Son    Diabetes Sister    Inflammatory bowel disease Father        diverticulitis   Multiple myeloma Father    Hypertension Son    Diabetes Maternal Grandmother    Colon cancer Neg Hx    Esophageal cancer Neg Hx    Rectal cancer Neg Hx    Stomach cancer Neg Hx    Breast cancer Neg Hx     Social  History:   reports that she has been smoking cigarettes. She has a 27.50 pack-year smoking history. She has never used smokeless tobacco. She reports current alcohol use of about 2.0 standard drinks of alcohol per week. She reports that she does not use drugs.  Physical Exam: BP 125/78 (BP Location: Left Arm, Patient Position: Sitting, Cuff Size: Normal)   Pulse 79   Ht 5' 1"$  (1.549 m)   Wt 95 lb (43.1 kg)   BMI 17.95 kg/m   Constitutional:  Alert and oriented, no acute distress, nontoxic appearing HEENT: [place], AT Cardiovascular: No clubbing, cyanosis, or edema Respiratory: Normal respiratory effort, no increased work of breathing Skin: No rashes, bruises or suspicious lesions Neurologic: Grossly intact, no focal deficits, moving all 4 extremities Psychiatric: Normal mood and affect  Laboratory Data: Results for orders placed or performed in visit on 09/17/22  Microscopic Examination   Urine  Result Value Ref Range   WBC, UA 0-5 0 - 5 /hpf   RBC, Urine 11-30 (A) 0 - 2 /hpf   Epithelial Cells (non renal) 0-10 0 - 10 /hpf   Casts Present (A) None seen /lpf   Cast Type Hyaline casts N/A   Mucus, UA Present (A) Not Estab.   Bacteria, UA Few None seen/Few  Urinalysis, Complete  Result Value Ref Range   Specific Gravity, UA 1.025 1.005 - 1.030   pH, UA 5.0 5.0 - 7.5   Color, UA Yellow Yellow   Appearance Ur Clear Clear   Leukocytes,UA Negative Negative   Protein,UA Negative Negative/Trace   Glucose, UA Negative Negative   Ketones, UA Negative Negative   RBC, UA 2+ (A) Negative   Bilirubin, UA Negative Negative   Urobilinogen, Ur 0.2 0.2 - 1.0 mg/dL   Nitrite, UA Negative Negative   Microscopic Examination See below:   Bladder Scan (Post Void  Residual) in office  Result Value Ref Range   Scan Result 63m    Assessment & Plan:   1. Nocturia New nocturia in the setting of increased stress.  She denies daytime storage symptoms but admits to some daytime somnolence.  We  discussed that her symptoms are most consistent with an undiagnosed sleep apnea and I encouraged her to discuss this with her PCP for consideration of a sleep study.  Will start her on a nighttime dose of Myrbetriq 25 mg in the interim for symptom control.  She is in agreement with this plan. - Urinalysis, Complete - Bladder Scan (Post Void Residual) in office - mirabegron ER (MYRBETRIQ) 25 MG TB24 tablet; Take 1 tablet (25 mg total) by mouth daily.  Dispense: 28 tablet; Refill: 0  Return in about 4 weeks (around 10/15/2022) for Symptom recheck with PVR.  SDebroah Loop PA-C  [facility] [address] [place] [phone]

## 2022-09-20 ENCOUNTER — Ambulatory Visit: Payer: PPO | Attending: Student

## 2022-09-20 DIAGNOSIS — R262 Difficulty in walking, not elsewhere classified: Secondary | ICD-10-CM | POA: Insufficient documentation

## 2022-09-20 DIAGNOSIS — R269 Unspecified abnormalities of gait and mobility: Secondary | ICD-10-CM | POA: Diagnosis not present

## 2022-09-20 DIAGNOSIS — M6281 Muscle weakness (generalized): Secondary | ICD-10-CM | POA: Insufficient documentation

## 2022-09-20 DIAGNOSIS — M79605 Pain in left leg: Secondary | ICD-10-CM | POA: Insufficient documentation

## 2022-09-20 NOTE — Therapy (Addendum)
OUTPATIENT PHYSICAL THERAPY THORACOLUMBAR/HIP EVALUATION   Patient Name: Yolanda Jimenez MRN: [id number] DOB:March 15, 1946, 77 y.o., female Today's Date: 09/24/2022  END OF SESSION:    Past Medical History:  Diagnosis Date   A-fib [facility])    Aortic atherosclerosis ([place])    Arthritis    back   Asthma    AVM (arteriovenous malformation)    Barrett esophagus    C. difficile diarrhea    Carotid stenosis    Complication of anesthesia    very hard to come out of anesthesia and pt states she gets very" loopy" after surgeries   COPD (chronic obstructive pulmonary disease) (HCC)    no inhalers   Depression    Diabetes mellitus without complication (HCC)    borderline   Diverticulosis    GERD (gastroesophageal reflux disease)    Heart murmur    Hx of adenomatous colonic polyps    Hyperlipidemia    IBS (irritable bowel syndrome)    Insomnia    Iron deficiency anemia    Kidney cysts    hx UTIs   Macular degeneration    left eye   Microscopic hematuria    Osteoporosis    mild   PVD (peripheral vascular disease) ([place])    Wears dentures    partial lower   Past Surgical History:  Procedure Laterality Date   BUNIONECTOMY     right foot   CATARACT EXTRACTION     both eyes   CERVICAL CONE BIOPSY     CHOLECYSTECTOMY     COLONOSCOPY     COLONOSCOPY WITH PROPOFOL N/A 01/22/2019   Procedure: COLONOSCOPY WITH BIOPSY;  Surgeon: Lucilla Lame, MD;  Location: [place];  Service: Endoscopy;  Laterality: N/A;  diabetic - diet controlled   COLONOSCOPY WITH PROPOFOL N/A 06/27/2020   Procedure: COLONOSCOPY WITH BIOPSY;  Surgeon: Lucilla Lame, MD;  Location: [place];  Service: Endoscopy;  Laterality: N/A;  priority 4   ESOPHAGOGASTRODUODENOSCOPY (EGD) WITH PROPOFOL N/A 08/29/2017   Procedure: ESOPHAGOGASTRODUODENOSCOPY (EGD) WITH PROPOFOL;  Surgeon: Lucilla Lame, MD;  Location: [place];  Service: Endoscopy;  Laterality: N/A;   FOOT SURGERY     right 2nd toe    HIP ARTHROPLASTY Left 05/16/2018   Procedure: ARTHROPLASTY BIPOLAR HIP (HEMIARTHROPLASTY);  Surgeon: Corky Mull, MD;  Location: [facility];  Service: Orthopedics;  Laterality: Left;   ORIF PERIPROSTHETIC FRACTURE Left 12/10/2021   Procedure: OPEN REDUCTION INTERNAL FIXATION OF A LEFT GREATER TROCHANTERIC FRACTURE;  Surgeon: Corky Mull, MD;  Location: [facility];  Service: Orthopedics;  Laterality: Left;   ORIF WRIST FRACTURE Right 10/07/2020   Procedure: OPEN REDUCTION INTERNAL FIXATION (ORIF) WRIST FRACTURE;  Surgeon: Hessie Knows, MD;  Location: [facility];  Service: Orthopedics;  Laterality: Right;   POLYPECTOMY N/A 01/22/2019   Procedure: POLYPECTOMY;  Surgeon: Lucilla Lame, MD;  Location: [place];  Service: Endoscopy;  Laterality: N/A;  2 Clips placed at polyp removal site in ascending colon   POLYPECTOMY N/A 06/27/2020   Procedure: POLYPECTOMY;  Surgeon: Lucilla Lame, MD;  Location: [place];  Service: Endoscopy;  Laterality: N/A;   TUBAL LIGATION     UPPER GASTROINTESTINAL ENDOSCOPY     Patient Active Problem List   Diagnosis Date Noted   Trochanteric avulsion fracture of femur ([place]) 12/10/2021   Personal history of colonic polyps    Polyp of transverse colon    Major depressive disorder, recurrent, mild ([place]) 04/08/2020   Loss of weight    Polyp  of ascending colon    Moderate protein-calorie malnutrition ([place]) 09/25/2018   Tubular adenoma 09/25/2018   Adult idiopathic generalized osteoporosis 08/03/2018   Aortic atherosclerosis ([place]) 08/03/2018   Panlobular emphysema ([place]) 08/03/2018   Cellulitis of left hip 06/01/2018   Status post hip hemiarthroplasty 05/17/2018   Hip fracture ([place]) 05/15/2018   Tobacco abuse 04/04/2018   DM type 2 with diabetic mixed hyperlipidemia ([place]) 03/08/2018   Low serum vitamin D 09/09/2017   History of Barrett's esophagus    Esophagitis, unspecified    PAD (peripheral artery disease) ([place]) 10/15/2016   Carotid stenosis  10/15/2016   Pain in toe 09/17/2016   Renal cyst, acquired, left 09/13/2016   Medicare annual wellness visit, initial 09/03/2016   Type 2 diabetes mellitus with peripheral angiopathy ([place]) 08/29/2014   Combined fat and carbohydrate induced hyperlipemia 08/29/2014   Incomplete emptying of bladder 07/24/2014   Increased frequency of urination 03/19/2014   Lower abdominal pain 03/02/2014   Microscopic hematuria 03/01/2014   Nocturia 03/01/2014   Urinary tract infection, site not specified 03/01/2014   Diarrhea 03/05/2013   COLONIC POLYPS, ADENOMATOUS 09/05/2007   Hyperlipidemia 09/05/2007   IRON DEFICIENCY 09/05/2007   BARRETTS ESOPHAGUS 09/05/2007   DIVERTICULOSIS, COLON 09/05/2007   IRRITABLE BOWEL SYNDROME 09/05/2007   ARTERIOVENOUS MALFORMATION 09/05/2007    PCP: Dr. Emily Filbert  REFERRING PROVIDER: Marlene Lard  REFERRING DIAG:  [phone] (ICD-10-CM) - Spondylosis without myelopathy or radiculopathy, lumbar region  S72.112D (ICD-10-CM) - Displaced fracture of greater trochanter of left femur, subsequent encounter for closed fracture with routine healing  Z96.649 (ICD-10-CM) - Presence of unspecified artificial hip joint  M79.605 (ICD-10-CM) - Pain in left leg    Rationale for Evaluation and Treatment: Rehabilitation  THERAPY DIAG:  Abnormality of gait and mobility - Plan: PT plan of care cert/re-cert  Difficulty in walking, not elsewhere classified - Plan: PT plan of care cert/re-cert  Muscle weakness (generalized) - Plan: PT plan of care cert/re-cert  Pain in left leg - Plan: PT plan of care cert/re-cert  ONSET DATE: June 2023  SUBJECTIVE:                                                                                                                                                                                           SUBJECTIVE STATEMENT: Patient reports left hip/LE pain since around the summer of 2023. States she has been trying to manage on her own.  Reports pain as annoying and hopeful to begin PT to help with pain relief. Reports symptoms down Left LE worsen with prolonged standing or walking.   PERTINENT HISTORY:  Patient is a  77 year old female with referral for PT for lumbar spondylosis and radicular symptoms as well as left Hip surgeries (Left Hemiarthroplasty in 2019 from fall and ORIF in April 2023 from another fall). Patient denies any other falls since then and states she was doing well immediately after her last surgery but began to experience some increased Left LE pain from thigh down to calf- intermittent - rates at 5-6/10 with some very infrequent sharp left lateral hip pain. She reports pain as "like a headache in my leg" and states pain worsens with standing/walking.   PAIN:  Are you having pain? Yes: NPRS scale: 0/10 at rest and 5-6/10 pain going down left LE and very sharp infrequent left hip pain-10/10 Pain location: anteriolateral Left thigh down into calf Pain description: ache Aggravating factors: sleeping on left side, Walking Relieving factors: Rest, tylenol, pain relieving gel- [employer]  PRECAUTIONS: Fall  WEIGHT BEARING RESTRICTIONS: No  FALLS:  Has patient fallen in last 6 months? No and several falls in 2023  LIVING ENVIRONMENT: Lives with: lives alone Lives in: House/apartment Stairs: No Has following equipment at home: Single point cane, Environmental consultant - 2 wheeled, Wheelchair (manual), and Flat entry  OCCUPATION: Retired Tax adviser  PLOF: Independent  PATIENT GOALS: Stop the pain and be able to walk more  NEXT MD VISIT: Unsure  OBJECTIVE:   DIAGNOSTIC FINDINGS:  Narrative & Impression  CLINICAL DATA:  Fall and pain.   EXAM: DG HIP (WITH OR WITHOUT PELVIS) 2-3V LEFT   COMPARISON:  Left hip radiographs dating back to 12/06/2021.   FINDINGS: Left the prosthesis appears intact. Status post prior fixation of fracture of greater trochanter of the left femur with fixation plate and cerclage  wire. No new fracture. No dislocation. The bones are osteopenic. The soft tissues are unremarkable. Vascular calcifications noted multiple cutaneous staples noted over the left hip.   IMPRESSION: No acute fracture.     Electronically Signed   By: Anner Crete M.D.   On: 12/13/2021 00:31    PATIENT SURVEYS:  FOTO 52/predicted value = 59     SCREENING FOR RED FLAGS: Bowel or bladder incontinence: No Spinal tumors: No Cauda equina syndrome: No Compression fracture: No Abdominal aneurysm: No  COGNITION: Overall cognitive status: Within functional limits for tasks assessed     SENSATION: Light touch: Impaired   MUSCLE LENGTH: Hamstrings: Right 90 deg; Left 75 deg    POSTURE: No Significant postural limitations  PALPATION: No tenderness along left greater trochanter region today.   LUMBAR ROM:   AROM eval  Flexion Fingers to ankles  Extension WNL  Right lateral flexion Fingers to fibular head  Left lateral flexion Fingers to mid calf  Right rotation Painful yet WNL  Left rotation WNL   (Blank rows = not tested)  LOWER EXTREMITY ROM:     Active  Right eval Left eval  Hip flexion    Hip extension    Hip abduction    Hip adduction    Hip internal rotation    Hip external rotation    Knee flexion    Knee extension    Ankle dorsiflexion    Ankle plantarflexion    Ankle inversion    Ankle eversion     (Blank rows = not tested)  LOWER EXTREMITY MMT:    MMT Right eval Left eval  Hip flexion    Hip extension    Hip abduction    Hip adduction    Hip internal rotation  Hip external rotation    Knee flexion    Knee extension    Ankle dorsiflexion    Ankle plantarflexion    Ankle inversion    Ankle eversion     (Blank rows = not tested)  LUMBAR SPECIAL TESTS:  Straight leg raise test: Negative and FABER test: Negative  FUNCTIONAL TESTS:  6 minute walk test: 750 feet in 3 min 20 sec - Stopped due to 6/10 left LE radiuclar pain down to  calf  GAIT: Distance walked: 750 Assistive device utilized:  None Level of assistance: SBA Comments: Initially slow reciprocal steps- mild unsteadiness yet after 1 min walking- observed significant increase in limp on left LE   Blood pressure- 144/76 mmHg Left UE sitting  TODAY'S TREATMENT:                                                                                                                              DATE: 09/20/2022  PT Evaluation and instruction in some basic LE stretching. See HEP    PATIENT EDUCATION:  Education details: Purpose of PT, anatomy of lumbar/hip region, Def of radiuculopathy, Instruction in HEP Person educated: Patient Education method: Explanation, Demonstration, Tactile cues, Verbal cues, and Handouts Education comprehension: verbalized understanding, returned demonstration, verbal cues required, tactile cues required, and needs further education  HOME EXERCISE PROGRAM: Access Code: [email or web address] URL: [email or web address] Date: 09/20/2022 Prepared by: Sande Brothers  Exercises - Sidelying Hip Flexor Stretch with Caregiver  - 1 x daily - 7 x weekly - 3 sets - 30 hold - Seated Hamstring Stretch  - 1 x daily - 7 x weekly - 3 sets - 30 hold  ASSESSMENT:  CLINICAL IMPRESSION: Patient is a 77 y.o. female who was seen today for physical therapy evaluation and treatment for Lumbar spodylosis/radiculopathy. Patient presents with pain limited left LE/hip mobility and LE Muscle weakness with decreased overall endurance. Patient will continue to benefit from skilled PT services to improve her functional LE strength and mobility for optimal functional independence and decreased risk of falling.   OBJECTIVE IMPAIRMENTS: Abnormal gait, decreased activity tolerance, decreased balance, decreased endurance, decreased mobility, difficulty walking, decreased ROM, decreased strength, hypomobility, impaired perceived functional ability, impaired  flexibility, and pain.   ACTIVITY LIMITATIONS: carrying, lifting, bending, standing, squatting, sleeping, stairs, transfers, and bed mobility  PARTICIPATION LIMITATIONS: cleaning, laundry, shopping, community activity, and yard work  PERSONAL FACTORS: Time since onset of injury/illness/exacerbation and 3+ comorbidities: arthritis, DM, previous surgeries to left hip  are also affecting patient's functional outcome.   REHAB POTENTIAL: Good  CLINICAL DECISION MAKING: Evolving/moderate complexity  EVALUATION COMPLEXITY: Moderate   GOALS: Goals reviewed with patient? Yes  SHORT TERM GOALS: Target date: 11/01/2022  Pt will be independent with HEP in order to improve strength and balance in order to decrease fall risk and improve function at home. Baseline: Eval- no formal HEP in place Goal status: INITIAL  2.  Pt will decrease worst back/LLE pain as  reported on NPRS by at least 2 points in order to demonstrate clinically significant reduction in back pain.  Baseline: EVAL= 6/10 LLE pain Goal status: INITIAL   LONG TERM GOALS: Target date: 12/13/2022  Pt will improve FOTO to target score of 59 to display perceived improvements in ability to complete ADL's.  Baseline: Eval = 52 Goal status: INITIAL  2.  Pt will increase 10MWT by at least 0.13 m/s in order to demonstrate clinically significant improvement in community ambulation.   Baseline: EVAL= 0.7 m/s Goal status: INITIAL  3.  Pt will increase 6MWT (able to complete the test and walk > 1200 feet)  in order to demonstrate clinically significant improvement in cardiopulmonary endurance and community ambulation  Baseline: EVAL= 750 feet in 3 min 20 sec Goal status: INITIAL  4.  Pt will decrease worst back/LLE pain as reported on NPRS by at least 3 points in order to demonstrate clinically significant reduction in back pain.  Baseline: EVAL= 6/10 with walking Left LE pain Goal status: INITIAL  5.  Patient will demonstrate improved  ability to stand > 30 min without rest break for improved functional strength and independence with activities.  Baseline: EVAL= Patient unstable to stand > 6 min during eval- complaining of increased LLE pain with standing walking. Goal status: INITIAL   PLAN:  PT FREQUENCY: 1-2x/week  PT DURATION: 12 weeks  PLANNED INTERVENTIONS: Therapeutic exercises, Therapeutic activity, Neuromuscular re-education, Balance training, Gait training, Patient/Family education, Self Care, Joint mobilization, Joint manipulation, Stair training, Vestibular training, Canalith repositioning, DME instructions, Dry Needling, Electrical stimulation, Spinal manipulation, Spinal mobilization, Cryotherapy, Moist heat, Taping, and Manual therapy.  PLAN FOR NEXT SESSION: Continue with lumbar assessment next visit and assess for any radicular symptoms; review and progress lumbar/LE ROM/stretching and strengthening. Gait assess and balance training as appropriate. Pain management strategies.    Lewis Moccasin, PT 09/24/2022, 8:29 AM

## 2022-09-23 ENCOUNTER — Ambulatory Visit: Payer: PPO

## 2022-09-23 DIAGNOSIS — M6281 Muscle weakness (generalized): Secondary | ICD-10-CM

## 2022-09-23 DIAGNOSIS — R269 Unspecified abnormalities of gait and mobility: Secondary | ICD-10-CM | POA: Diagnosis not present

## 2022-09-23 DIAGNOSIS — R262 Difficulty in walking, not elsewhere classified: Secondary | ICD-10-CM

## 2022-09-23 DIAGNOSIS — M79605 Pain in left leg: Secondary | ICD-10-CM

## 2022-09-23 NOTE — Therapy (Signed)
OUTPATIENT PHYSICAL THERAPY THORACOLUMBAR/HIP EVALUATION   Patient Name: Yolanda Jimenez MRN: [id number] DOB:12/14/1945, 77 y.o., female Today's Date: 09/24/2022  END OF SESSION:  PT End of Session - 09/23/22 0752     Visit Number 2    Number of Visits 24    Date for PT Re-Evaluation 12/13/22    Progress Note Due on Visit 10    PT Start Time 0802    PT Stop Time 0843    PT Time Calculation (min) 41 min    Equipment Utilized During Treatment Gait belt    Activity Tolerance Patient tolerated treatment well;Patient limited by pain    Behavior During Therapy WFL for tasks assessed/performed             Past Medical History:  Diagnosis Date   A-fib ([place])    Aortic atherosclerosis ([place])    Arthritis    back   Asthma    AVM (arteriovenous malformation)    Barrett esophagus    C. difficile diarrhea    Carotid stenosis    Complication of anesthesia    very hard to come out of anesthesia and pt states she gets very" loopy" after surgeries   COPD (chronic obstructive pulmonary disease) (HCC)    no inhalers   Depression    Diabetes mellitus without complication (HCC)    borderline   Diverticulosis    GERD (gastroesophageal reflux disease)    Heart murmur    Hx of adenomatous colonic polyps    Hyperlipidemia    IBS (irritable bowel syndrome)    Insomnia    Iron deficiency anemia    Kidney cysts    hx UTIs   Macular degeneration    left eye   Microscopic hematuria    Osteoporosis    mild   PVD (peripheral vascular disease) (HCC)    Wears dentures    partial lower   Past Surgical History:  Procedure Laterality Date   BUNIONECTOMY     right foot   CATARACT EXTRACTION     both eyes   CERVICAL CONE BIOPSY     CHOLECYSTECTOMY     COLONOSCOPY     COLONOSCOPY WITH PROPOFOL N/A 01/22/2019   Procedure: COLONOSCOPY WITH BIOPSY;  Surgeon: Lucilla Lame, MD;  Location: [facility];  Service: Endoscopy;  Laterality: N/A;  diabetic - diet controlled   COLONOSCOPY  WITH PROPOFOL N/A 06/27/2020   Procedure: COLONOSCOPY WITH BIOPSY;  Surgeon: Lucilla Lame, MD;  Location: [facility];  Service: Endoscopy;  Laterality: N/A;  priority 4   ESOPHAGOGASTRODUODENOSCOPY (EGD) WITH PROPOFOL N/A 08/29/2017   Procedure: ESOPHAGOGASTRODUODENOSCOPY (EGD) WITH PROPOFOL;  Surgeon: Lucilla Lame, MD;  Location: [facility];  Service: Endoscopy;  Laterality: N/A;   FOOT SURGERY     right 2nd toe   HIP ARTHROPLASTY Left 05/16/2018   Procedure: ARTHROPLASTY BIPOLAR HIP (HEMIARTHROPLASTY);  Surgeon: Corky Mull, MD;  Location: [facility];  Service: Orthopedics;  Laterality: Left;   ORIF PERIPROSTHETIC FRACTURE Left 12/10/2021   Procedure: OPEN REDUCTION INTERNAL FIXATION OF A LEFT GREATER TROCHANTERIC FRACTURE;  Surgeon: Corky Mull, MD;  Location: [facility];  Service: Orthopedics;  Laterality: Left;   ORIF WRIST FRACTURE Right 10/07/2020   Procedure: OPEN REDUCTION INTERNAL FIXATION (ORIF) WRIST FRACTURE;  Surgeon: Hessie Knows, MD;  Location: [facility];  Service: Orthopedics;  Laterality: Right;   POLYPECTOMY N/A 01/22/2019   Procedure: POLYPECTOMY;  Surgeon: Lucilla Lame, MD;  Location: [facility];  Service: Endoscopy;  Laterality: N/A;  2 Clips placed at polyp removal site in ascending colon   POLYPECTOMY N/A 06/27/2020   Procedure: POLYPECTOMY;  Surgeon: Lucilla Lame, MD;  Location: [facility];  Service: Endoscopy;  Laterality: N/A;   TUBAL LIGATION     UPPER GASTROINTESTINAL ENDOSCOPY     Patient Active Problem List   Diagnosis Date Noted   Trochanteric avulsion fracture of femur ([place]) 12/10/2021   Personal history of colonic polyps    Polyp of transverse colon    Major depressive disorder, recurrent, mild ([place]) 04/08/2020   Loss of weight    Polyp of ascending colon    Moderate protein-calorie malnutrition ([place]) 09/25/2018   Tubular adenoma 09/25/2018   Adult idiopathic generalized osteoporosis 08/03/2018   Aortic atherosclerosis ([place])  08/03/2018   Panlobular emphysema ([place]) 08/03/2018   Cellulitis of left hip 06/01/2018   Status post hip hemiarthroplasty 05/17/2018   Hip fracture ([place]) 05/15/2018   Tobacco abuse 04/04/2018   DM type 2 with diabetic mixed hyperlipidemia ([place]) 03/08/2018   Low serum vitamin D 09/09/2017   History of Barrett's esophagus    Esophagitis, unspecified    PAD (peripheral artery disease) ([place]) 10/15/2016   Carotid stenosis 10/15/2016   Pain in toe 09/17/2016   Renal cyst, acquired, left 09/13/2016   Medicare annual wellness visit, initial 09/03/2016   Type 2 diabetes mellitus with peripheral angiopathy ([place]) 08/29/2014   Combined fat and carbohydrate induced hyperlipemia 08/29/2014   Incomplete emptying of bladder 07/24/2014   Increased frequency of urination 03/19/2014   Lower abdominal pain 03/02/2014   Microscopic hematuria 03/01/2014   Nocturia 03/01/2014   Urinary tract infection, site not specified 03/01/2014   Diarrhea 03/05/2013   COLONIC POLYPS, ADENOMATOUS 09/05/2007   Hyperlipidemia 09/05/2007   IRON DEFICIENCY 09/05/2007   BARRETTS ESOPHAGUS 09/05/2007   DIVERTICULOSIS, COLON 09/05/2007   IRRITABLE BOWEL SYNDROME 09/05/2007   ARTERIOVENOUS MALFORMATION 09/05/2007    PCP: Dr. Emily Filbert  REFERRING PROVIDER: Marlene Lard  REFERRING DIAG:  [id number] (ICD-10-CM) - Spondylosis without myelopathy or radiculopathy, lumbar region  S72.112D (ICD-10-CM) - Displaced fracture of greater trochanter of left femur, subsequent encounter for closed fracture with routine healing  Z96.649 (ICD-10-CM) - Presence of unspecified artificial hip joint  M79.605 (ICD-10-CM) - Pain in left leg    Rationale for Evaluation and Treatment: Rehabilitation  THERAPY DIAG:  Abnormality of gait and mobility  Difficulty in walking, not elsewhere classified  Muscle weakness (generalized)  Pain in left leg  ONSET DATE: June 2023  SUBJECTIVE:  SUBJECTIVE STATEMENT: Patient reports feeling about the same- states really only hurting after a few min of walking.    PERTINENT HISTORY:  Patient is a 77 year old female with referral for PT for lumbar spondylosis and radicular symptoms as well as left Hip surgeries (Left Hemiarthroplasty in 2019 from fall and ORIF in April 2023 from another fall). Patient denies any other falls since then and states she was doing well immediately after her last surgery but began to experience some increased Left LE pain from thigh down to calf- intermittent - rates at 5-6/10 with some very infrequent sharp left lateral hip pain. She reports pain as "like a headache in my leg" and states pain worsens with standing/walking.   PAIN:  Are you having pain? Yes: NPRS scale: 0/10 at rest and 5-6/10 pain going down left LE and very sharp infrequent left hip pain-10/10 Pain location: anteriolateral Left thigh down into calf Pain description: ache Aggravating factors: sleeping on left side, Walking Relieving factors: Rest, tylenol, pain relieving gel- [employer]  PRECAUTIONS: Fall  WEIGHT BEARING RESTRICTIONS: No  FALLS:  Has patient fallen in last 6 months? No and several falls in 2023  LIVING ENVIRONMENT: Lives with: lives alone Lives in: House/apartment Stairs: No Has following equipment at home: Single point cane, Environmental consultant - 2 wheeled, Wheelchair (manual), and Flat entry  OCCUPATION: Retired Tax adviser  PLOF: Independent  PATIENT GOALS: Stop the pain and be able to walk more  NEXT MD VISIT: Unsure  OBJECTIVE:   DIAGNOSTIC FINDINGS:  Narrative & Impression  CLINICAL DATA:  Fall and pain.   EXAM: DG HIP (WITH OR WITHOUT PELVIS) 2-3V LEFT   COMPARISON:  Left hip radiographs dating back to 12/06/2021.   FINDINGS: Left the prosthesis appears intact.  Status post prior fixation of fracture of greater trochanter of the left femur with fixation plate and cerclage wire. No new fracture. No dislocation. The bones are osteopenic. The soft tissues are unremarkable. Vascular calcifications noted multiple cutaneous staples noted over the left hip.   IMPRESSION: No acute fracture.     Electronically Signed   By: Anner Crete M.D.   On: 12/13/2021 00:31    PATIENT SURVEYS:  FOTO 52/predicted value = 59     SCREENING FOR RED FLAGS: Bowel or bladder incontinence: No Spinal tumors: No Cauda equina syndrome: No Compression fracture: No Abdominal aneurysm: No  COGNITION: Overall cognitive status: Within functional limits for tasks assessed     SENSATION: Light touch: Impaired   MUSCLE LENGTH: Hamstrings: Right 90 deg; Left 75 deg    POSTURE: No Significant postural limitations  PALPATION: No tenderness along left greater trochanter region today.   LUMBAR ROM:   AROM eval  Flexion Fingers to ankles  Extension WNL  Right lateral flexion Fingers to fibular head  Left lateral flexion Fingers to mid calf  Right rotation Painful yet WNL  Left rotation WNL   (Blank rows = not tested)  LOWER EXTREMITY ROM:     Active  Right eval Left eval  Hip flexion    Hip extension    Hip abduction    Hip adduction    Hip internal rotation    Hip external rotation    Knee flexion    Knee extension    Ankle dorsiflexion    Ankle plantarflexion    Ankle inversion    Ankle eversion     (Blank rows = not tested)  LOWER EXTREMITY MMT:    MMT Right eval Left  eval  Hip flexion    Hip extension    Hip abduction    Hip adduction    Hip internal rotation    Hip external rotation    Knee flexion    Knee extension    Ankle dorsiflexion    Ankle plantarflexion    Ankle inversion    Ankle eversion     (Blank rows = not tested)  LUMBAR SPECIAL TESTS:  Straight leg raise test: Negative and FABER test:  Negative  FUNCTIONAL TESTS:  6 minute walk test: 750 feet in 3 min 20 sec - Stopped due to 6/10 left LE radiuclar pain down to calf  GAIT: Distance walked: 750 Assistive device utilized:  None Level of assistance: SBA Comments: Initially slow reciprocal steps- mild unsteadiness yet after 1 min walking- observed significant increase in limp on left LE   Blood pressure- 144/76 mmHg Left UE sitting  TODAY'S TREATMENT:                                                                                                                              DATE: 09/23/2022  Therex:   PROM to left hip - Flex/ext/IR/ER- CW/CCW and hip abd/add x several min.  Hamstring Left x 30 sec x 2 Supine Gluteal sets - hold 5 sec x 10 reps Quad set- hold 5 sec x 12 reps both LE Hip add with ball squeeze hold 5 sec x 10 reps  Heel slide Left LE x 12 reps  (as painfree as possible)  Hip abd with slide board under heel- (as painfree as possible)   Added above therex to HEP today and reviewed handout with patient. She was able to verbalize understanding.   PATIENT EDUCATION:  Education details: Purpose of PT, anatomy of lumbar/hip region, Def of radiuculopathy, Instruction in HEP Person educated: Patient Education method: Explanation, Demonstration, Tactile cues, Verbal cues, and Handouts Education comprehension: verbalized understanding, returned demonstration, verbal cues required, tactile cues required, and needs further education  HOME EXERCISE PROGRAM: Access Code: [email or web address] URL: [email or web address] Date: 09/23/2022 Prepared by: Sande Brothers  Exercises - Supine Gluteal Sets  - 1 x daily - 3 x weekly - 3 sets - 10 reps - Supine Hip Abduction  - 1 x daily - 3 x weekly - 3 sets - 10 reps - Supine Isometric Hip Adduction with Pillow at Knees  - 1 x daily - 3 x weekly - 3 sets - 10 reps - Quad Set  - 1 x daily - 3 x weekly - 3 sets - 10 reps - Supine Heel Slide  - 1 x daily - 3 x weekly  - 3 sets - 10 reps     Access Code: [email or web address] URL: [email or web address] Date: 09/20/2022 Prepared by: Sande Brothers  Exercises - Sidelying Hip Flexor Stretch with Caregiver  - 1 x daily - 7 x weekly - 3 sets - 30 hold - Seated Hamstring Stretch  - 1 x daily -  7 x weekly - 3 sets - 30 hold  ASSESSMENT:  CLINICAL IMPRESSION: Patient presents today with ongoing left hip pain with some mobility. She responded fair to some PROM- some pain with hip circles and hamstring stretch on left. She was instructed in some basic non-weight bearing LE strengthening and able to perform well overall. Patient will continue to benefit from skilled PT services to improve her functional LE strength and mobility for optimal functional independence and decreased risk of falling.   OBJECTIVE IMPAIRMENTS: Abnormal gait, decreased activity tolerance, decreased balance, decreased endurance, decreased mobility, difficulty walking, decreased ROM, decreased strength, hypomobility, impaired perceived functional ability, impaired flexibility, and pain.   ACTIVITY LIMITATIONS: carrying, lifting, bending, standing, squatting, sleeping, stairs, transfers, and bed mobility  PARTICIPATION LIMITATIONS: cleaning, laundry, shopping, community activity, and yard work  PERSONAL FACTORS: Time since onset of injury/illness/exacerbation and 3+ comorbidities: arthritis, DM, previous surgeries to left hip  are also affecting patient's functional outcome.   REHAB POTENTIAL: Good  CLINICAL DECISION MAKING: Evolving/moderate complexity  EVALUATION COMPLEXITY: Moderate   GOALS: Goals reviewed with patient? Yes  SHORT TERM GOALS: Target date: 11/01/2022  Pt will be independent with HEP in order to improve strength and balance in order to decrease fall risk and improve function at home. Baseline: Eval- no formal HEP in place Goal status: INITIAL  2.  Pt will decrease worst back/LLE pain as reported on NPRS by  at least 2 points in order to demonstrate clinically significant reduction in back pain.  Baseline: EVAL= 6/10 LLE pain Goal status: INITIAL   LONG TERM GOALS: Target date: 12/13/2022  Pt will improve FOTO to target score of 59 to display perceived improvements in ability to complete ADL's.  Baseline: Eval = 52 Goal status: INITIAL  2.  Pt will increase 10MWT by at least 0.13 m/s in order to demonstrate clinically significant improvement in community ambulation.   Baseline: EVAL= 0.7 m/s Goal status: INITIAL  3.  Pt will increase 6MWT (able to complete the test and walk > 1200 feet)  in order to demonstrate clinically significant improvement in cardiopulmonary endurance and community ambulation  Baseline: EVAL= 750 feet in 3 min 20 sec Goal status: INITIAL  4.  Pt will decrease worst back/LLE pain as reported on NPRS by at least 3 points in order to demonstrate clinically significant reduction in back pain.  Baseline: EVAL= 6/10 with walking Left LE pain Goal status: INITIAL  5.  Patient will demonstrate improved ability to stand > 30 min without rest break for improved functional strength and independence with activities.  Baseline: EVAL= Patient unstable to stand > 6 min during eval- complaining of increased LLE pain with standing walking. Goal status: INITIAL   PLAN:  PT FREQUENCY: 1-2x/week  PT DURATION: 12 weeks  PLANNED INTERVENTIONS: Therapeutic exercises, Therapeutic activity, Neuromuscular re-education, Balance training, Gait training, Patient/Family education, Self Care, Joint mobilization, Joint manipulation, Stair training, Vestibular training, Canalith repositioning, DME instructions, Dry Needling, Electrical stimulation, Spinal manipulation, Spinal mobilization, Cryotherapy, Moist heat, Taping, and Manual therapy.  PLAN FOR NEXT SESSION: Continue with LE strengthening and  Pain management strategies.    Lewis Moccasin, PT 09/24/2022, 8:09 AM

## 2022-09-23 NOTE — Therapy (Incomplete)
OUTPATIENT PHYSICAL THERAPY THORACOLUMBAR/HIP EVALUATION   Patient Name: Yolanda Jimenez MRN: [id number] DOB:Apr 01, 1946, 77 y.o., female Today's Date: 09/23/2022  END OF SESSION:  PT End of Session - 09/23/22 0752     Visit Number 2    Number of Visits 24    Date for PT Re-Evaluation 12/13/22    Progress Note Due on Visit 10    Equipment Utilized During Treatment Gait belt    Activity Tolerance Patient tolerated treatment well;Patient limited by pain    Behavior During Therapy [facility] for tasks assessed/performed             Past Medical History:  Diagnosis Date   A-fib ([place])    Aortic atherosclerosis ([place])    Arthritis    back   Asthma    AVM (arteriovenous malformation)    Barrett esophagus    C. difficile diarrhea    Carotid stenosis    Complication of anesthesia    very hard to come out of anesthesia and pt states she gets very" loopy" after surgeries   COPD (chronic obstructive pulmonary disease) (HCC)    no inhalers   Depression    Diabetes mellitus without complication (HCC)    borderline   Diverticulosis    GERD (gastroesophageal reflux disease)    Heart murmur    Hx of adenomatous colonic polyps    Hyperlipidemia    IBS (irritable bowel syndrome)    Insomnia    Iron deficiency anemia    Kidney cysts    hx UTIs   Macular degeneration    left eye   Microscopic hematuria    Osteoporosis    mild   PVD (peripheral vascular disease) ([place])    Wears dentures    partial lower   Past Surgical History:  Procedure Laterality Date   BUNIONECTOMY     right foot   CATARACT EXTRACTION     both eyes   CERVICAL CONE BIOPSY     CHOLECYSTECTOMY     COLONOSCOPY     COLONOSCOPY WITH PROPOFOL N/A 01/22/2019   Procedure: COLONOSCOPY WITH BIOPSY;  Surgeon: Lucilla Lame, MD;  Location: [place];  Service: Endoscopy;  Laterality: N/A;  diabetic - diet controlled   COLONOSCOPY WITH PROPOFOL N/A 06/27/2020   Procedure: COLONOSCOPY WITH BIOPSY;  Surgeon: Lucilla Lame, MD;  Location: [place];  Service: Endoscopy;  Laterality: N/A;  priority 4   ESOPHAGOGASTRODUODENOSCOPY (EGD) WITH PROPOFOL N/A 08/29/2017   Procedure: ESOPHAGOGASTRODUODENOSCOPY (EGD) WITH PROPOFOL;  Surgeon: Lucilla Lame, MD;  Location: [place];  Service: Endoscopy;  Laterality: N/A;   FOOT SURGERY     right 2nd toe   HIP ARTHROPLASTY Left 05/16/2018   Procedure: ARTHROPLASTY BIPOLAR HIP (HEMIARTHROPLASTY);  Surgeon: Corky Mull, MD;  Location: [facility];  Service: Orthopedics;  Laterality: Left;   ORIF PERIPROSTHETIC FRACTURE Left 12/10/2021   Procedure: OPEN REDUCTION INTERNAL FIXATION OF A LEFT GREATER TROCHANTERIC FRACTURE;  Surgeon: Corky Mull, MD;  Location: [facility];  Service: Orthopedics;  Laterality: Left;   ORIF WRIST FRACTURE Right 10/07/2020   Procedure: OPEN REDUCTION INTERNAL FIXATION (ORIF) WRIST FRACTURE;  Surgeon: Hessie Knows, MD;  Location: [facility];  Service: Orthopedics;  Laterality: Right;   POLYPECTOMY N/A 01/22/2019   Procedure: POLYPECTOMY;  Surgeon: Lucilla Lame, MD;  Location: [place];  Service: Endoscopy;  Laterality: N/A;  2 Clips placed at polyp removal site in ascending colon   POLYPECTOMY N/A 06/27/2020   Procedure: POLYPECTOMY;  Surgeon: Allen [place],  Darren, MD;  Location: [place];  Service: Endoscopy;  Laterality: N/A;   TUBAL LIGATION     UPPER GASTROINTESTINAL ENDOSCOPY     Patient Active Problem List   Diagnosis Date Noted   Trochanteric avulsion fracture of femur ([place]) 12/10/2021   Personal history of colonic polyps    Polyp of transverse colon    Major depressive disorder, recurrent, mild ([place]) 04/08/2020   Loss of weight    Polyp of ascending colon    Moderate protein-calorie malnutrition ([place]) 09/25/2018   Tubular adenoma 09/25/2018   Adult idiopathic generalized osteoporosis 08/03/2018   Aortic atherosclerosis ([place]) 08/03/2018   Panlobular emphysema ([place]) 08/03/2018   Cellulitis of left hip  06/01/2018   Status post hip hemiarthroplasty 05/17/2018   Hip fracture ([place]) 05/15/2018   Tobacco abuse 04/04/2018   DM type 2 with diabetic mixed hyperlipidemia ([place]) 03/08/2018   Low serum vitamin D 09/09/2017   History of Barrett's esophagus    Esophagitis, unspecified    PAD (peripheral artery disease) ([place]) 10/15/2016   Carotid stenosis 10/15/2016   Pain in toe 09/17/2016   Renal cyst, acquired, left 09/13/2016   Medicare annual wellness visit, initial 09/03/2016   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) 08/29/2014   Combined fat and carbohydrate induced hyperlipemia 08/29/2014   Incomplete emptying of bladder 07/24/2014   Increased frequency of urination 03/19/2014   Lower abdominal pain 03/02/2014   Microscopic hematuria 03/01/2014   Nocturia 03/01/2014   Urinary tract infection, site not specified 03/01/2014   Diarrhea 03/05/2013   COLONIC POLYPS, ADENOMATOUS 09/05/2007   Hyperlipidemia 09/05/2007   IRON DEFICIENCY 09/05/2007   BARRETTS ESOPHAGUS 09/05/2007   DIVERTICULOSIS, COLON 09/05/2007   IRRITABLE BOWEL SYNDROME 09/05/2007   ARTERIOVENOUS MALFORMATION 09/05/2007    PCP: Dr. Emily Filbert  REFERRING PROVIDER: Marlene Lard  REFERRING DIAG:  [phone] (ICD-10-CM) - Spondylosis without myelopathy or radiculopathy, lumbar region  S72.112D (ICD-10-CM) - Displaced fracture of greater trochanter of left femur, subsequent encounter for closed fracture with routine healing  Z96.649 (ICD-10-CM) - Presence of unspecified artificial hip joint  M79.605 (ICD-10-CM) - Pain in left leg    Rationale for Evaluation and Treatment: Rehabilitation  THERAPY DIAG:  Abnormality of gait and mobility  Difficulty in walking, not elsewhere classified  Muscle weakness (generalized)  Pain in left leg  ONSET DATE: June 2023  SUBJECTIVE:                                                                                                                                                                                            SUBJECTIVE STATEMENT: Patient reports left  hip/LE pain since around the summer of 2023. States she has been trying to manage on her own. Reports pain as annoying and hopeful to begin PT to help with pain relief. Reports symptoms down Left LE worsen with prolonged standing or walking.   PERTINENT HISTORY:  Patient is a 77 year old female with referral for PT for lumbar spondylosis and radicular symptoms as well as left Hip surgeries (Left Hemiarthroplasty in 2019 from fall and ORIF in April 2023 from another fall). Patient denies any other falls since then and states she was doing well immediately after her last surgery but began to experience some increased Left LE pain from thigh down to calf- intermittent - rates at 5-6/10 with some very infrequent sharp left lateral hip pain. She reports pain as "like a headache in my leg" and states pain worsens with standing/walking.   PAIN:  Are you having pain? Yes: NPRS scale: 0/10 at rest and 5-6/10 pain going down left LE and very sharp infrequent left hip pain-10/10 Pain location: anteriolateral Left thigh down into calf Pain description: ache Aggravating factors: sleeping on left side, Walking Relieving factors: Rest, tylenol, pain relieving gel- [employer]  PRECAUTIONS: Fall  WEIGHT BEARING RESTRICTIONS: No  FALLS:  Has patient fallen in last 6 months? No and several falls in 2023  LIVING ENVIRONMENT: Lives with: lives alone Lives in: House/apartment Stairs: No Has following equipment at home: Single point cane, Environmental consultant - 2 wheeled, Wheelchair (manual), and Flat entry  OCCUPATION: Retired Tax adviser  PLOF: Independent  PATIENT GOALS: Stop the pain and be able to walk more  NEXT MD VISIT: Unsure  OBJECTIVE:   DIAGNOSTIC FINDINGS:  Narrative & Impression  CLINICAL DATA:  Fall and pain.   EXAM: DG HIP (WITH OR WITHOUT PELVIS) 2-3V LEFT   COMPARISON:  Left hip  radiographs dating back to 12/06/2021.   FINDINGS: Left the prosthesis appears intact. Status post prior fixation of fracture of greater trochanter of the left femur with fixation plate and cerclage wire. No new fracture. No dislocation. The bones are osteopenic. The soft tissues are unremarkable. Vascular calcifications noted multiple cutaneous staples noted over the left hip.   IMPRESSION: No acute fracture.     Electronically Signed   By: Anner Crete M.D.   On: 12/13/2021 00:31    PATIENT SURVEYS:  FOTO 52/predicted value = 59     SCREENING FOR RED FLAGS: Bowel or bladder incontinence: No Spinal tumors: No Cauda equina syndrome: No Compression fracture: No Abdominal aneurysm: No  COGNITION: Overall cognitive status: Within functional limits for tasks assessed     SENSATION: Light touch: Impaired   MUSCLE LENGTH: Hamstrings: Right 90 deg; Left 75 deg    POSTURE: No Significant postural limitations  PALPATION: No tenderness along left greater trochanter region today.   LUMBAR ROM:   AROM eval  Flexion Fingers to ankles  Extension WNL  Right lateral flexion Fingers to fibular head  Left lateral flexion Fingers to mid calf  Right rotation Painful yet WNL  Left rotation WNL   (Blank rows = not tested)  LOWER EXTREMITY ROM:     Active  Right eval Left eval  Hip flexion    Hip extension    Hip abduction    Hip adduction    Hip internal rotation    Hip external rotation    Knee flexion    Knee extension    Ankle dorsiflexion    Ankle plantarflexion    Ankle inversion  Ankle eversion     (Blank rows = not tested)  LOWER EXTREMITY MMT:    MMT Right eval Left eval  Hip flexion    Hip extension    Hip abduction    Hip adduction    Hip internal rotation    Hip external rotation    Knee flexion    Knee extension    Ankle dorsiflexion    Ankle plantarflexion    Ankle inversion    Ankle eversion     (Blank rows = not  tested)  LUMBAR SPECIAL TESTS:  Straight leg raise test: Negative and FABER test: Negative  FUNCTIONAL TESTS:  6 minute walk test: 750 feet in 3 min 20 sec - Stopped due to 6/10 left LE radiuclar pain down to calf  GAIT: Distance walked: 750 Assistive device utilized:  None Level of assistance: SBA Comments: Initially slow reciprocal steps- mild unsteadiness yet after 1 min walking- observed significant increase in limp on left LE   Blood pressure- 144/76 mmHg Left UE sitting  TODAY'S TREATMENT:                                                                                                                              DATE: 09/20/2022  PT Evaluation and instruction in some basic LE stretching. See HEP    PATIENT EDUCATION:  Education details: Purpose of PT, anatomy of lumbar/hip region, Def of radiuculopathy, Instruction in HEP Person educated: Patient Education method: Explanation, Demonstration, Tactile cues, Verbal cues, and Handouts Education comprehension: verbalized understanding, returned demonstration, verbal cues required, tactile cues required, and needs further education  HOME EXERCISE PROGRAM: Access Code: [email or web address] URL: [email or web address] Date: 09/20/2022 Prepared by: Sande Brothers  Exercises - Sidelying Hip Flexor Stretch with Caregiver  - 1 x daily - 7 x weekly - 3 sets - 30 hold - Seated Hamstring Stretch  - 1 x daily - 7 x weekly - 3 sets - 30 hold  ASSESSMENT:  CLINICAL IMPRESSION: Patient is a 77 y.o. female who was seen today for physical therapy evaluation and treatment for Lumbar spodylosis/radiculopathy.   OBJECTIVE IMPAIRMENTS: Abnormal gait, decreased activity tolerance, decreased balance, decreased endurance, decreased mobility, difficulty walking, decreased ROM, decreased strength, hypomobility, impaired perceived functional ability, impaired flexibility, and pain.   ACTIVITY LIMITATIONS: carrying, lifting, bending,  standing, squatting, sleeping, stairs, transfers, and bed mobility  PARTICIPATION LIMITATIONS: cleaning, laundry, shopping, community activity, and yard work  PERSONAL FACTORS: Time since onset of injury/illness/exacerbation and 3+ comorbidities: arthritis, DM, previous surgeries to left hip  are also affecting patient's functional outcome.   REHAB POTENTIAL: Good  CLINICAL DECISION MAKING: Evolving/moderate complexity  EVALUATION COMPLEXITY: Moderate   GOALS: Goals reviewed with patient? Yes  SHORT TERM GOALS: Target date: 11/01/2022  Pt will be independent with HEP in order to improve strength and balance in order to decrease fall risk and improve function at home. Baseline: Eval- no formal HEP in place Goal status: INITIAL  2.  Pt will decrease worst back/LLE pain as reported on NPRS by at least 2 points in order to demonstrate clinically significant reduction in back pain.  Baseline: EVAL= 6/10 LLE pain Goal status: INITIAL   LONG TERM GOALS: Target date: 12/13/2022  Pt will improve FOTO to target score of 59 to display perceived improvements in ability to complete ADL's.  Baseline: Eval = 52 Goal status: INITIAL  2.  Pt will increase 10MWT by at least 0.13 m/s in order to demonstrate clinically significant improvement in community ambulation.   Baseline: EVAL= 0.7 m/s Goal status: INITIAL  3.  Pt will increase 6MWT (able to complete the test and walk > 1200 feet)  in order to demonstrate clinically significant improvement in cardiopulmonary endurance and community ambulation  Baseline: EVAL= 750 feet in 3 min 20 sec Goal status: INITIAL  4.  Pt will decrease worst back/LLE pain as reported on NPRS by at least 3 points in order to demonstrate clinically significant reduction in back pain.  Baseline: EVAL= 6/10 with walking Left LE pain Goal status: INITIAL  5.  Patient will demonstrate improved ability to stand > 30 min without rest break for improved functional strength  and independence with activities.  Baseline: EVAL= Patient unstable to stand > 6 min during eval- complaining of increased LLE pain with standing walking. Goal status: INITIAL   PLAN:  PT FREQUENCY: 1-2x/week  PT DURATION: 12 weeks  PLANNED INTERVENTIONS: Therapeutic exercises, Therapeutic activity, Neuromuscular re-education, Balance training, Gait training, Patient/Family education, Self Care, Joint mobilization, Joint manipulation, Stair training, Vestibular training, Canalith repositioning, DME instructions, Dry Needling, Electrical stimulation, Spinal manipulation, Spinal mobilization, Cryotherapy, Moist heat, Taping, and Manual therapy.  PLAN FOR NEXT SESSION: Continue with lumbar assessment next visit and assess for any radicular symptoms; review and progress lumbar/LE ROM/stretching and strengthening. Gait assess and balance training as appropriate. Pain management strategies.    Lewis Moccasin, PT 09/23/2022, 7:53 AM

## 2022-09-27 ENCOUNTER — Ambulatory Visit: Payer: PPO

## 2022-09-27 DIAGNOSIS — M6281 Muscle weakness (generalized): Secondary | ICD-10-CM

## 2022-09-27 DIAGNOSIS — R269 Unspecified abnormalities of gait and mobility: Secondary | ICD-10-CM

## 2022-09-27 DIAGNOSIS — M79605 Pain in left leg: Secondary | ICD-10-CM

## 2022-09-27 DIAGNOSIS — R262 Difficulty in walking, not elsewhere classified: Secondary | ICD-10-CM

## 2022-09-27 NOTE — Therapy (Signed)
OUTPATIENT PHYSICAL THERAPY THORACOLUMBAR/HIP EVALUATION   Patient Name: Yolanda Jimenez MRN: [id number] DOB:1945-08-17, 77 y.o., female Today's Date: 09/27/2022  END OF SESSION:  PT End of Session - 09/27/22 0758     Visit Number 3    Number of Visits 24    Date for PT Re-Evaluation 12/13/22    Progress Note Due on Visit 10    PT Start Time 0800    PT Stop Time 0840    PT Time Calculation (min) 40 min    Equipment Utilized During Treatment Gait belt    Activity Tolerance Patient tolerated treatment well;No increased pain    Behavior During Therapy WFL for tasks assessed/performed              Past Medical History:  Diagnosis Date   A-fib ([place])    Aortic atherosclerosis (HCC)    Arthritis    back   Asthma    AVM (arteriovenous malformation)    Barrett esophagus    C. difficile diarrhea    Carotid stenosis    Complication of anesthesia    very hard to come out of anesthesia and pt states she gets very" loopy" after surgeries   COPD (chronic obstructive pulmonary disease) (HCC)    no inhalers   Depression    Diabetes mellitus without complication (HCC)    borderline   Diverticulosis    GERD (gastroesophageal reflux disease)    Heart murmur    Hx of adenomatous colonic polyps    Hyperlipidemia    IBS (irritable bowel syndrome)    Insomnia    Iron deficiency anemia    Kidney cysts    hx UTIs   Macular degeneration    left eye   Microscopic hematuria    Osteoporosis    mild   PVD (peripheral vascular disease) ([place])    Wears dentures    partial lower   Past Surgical History:  Procedure Laterality Date   BUNIONECTOMY     right foot   CATARACT EXTRACTION     both eyes   CERVICAL CONE BIOPSY     CHOLECYSTECTOMY     COLONOSCOPY     COLONOSCOPY WITH PROPOFOL N/A 01/22/2019   Procedure: COLONOSCOPY WITH BIOPSY;  Surgeon: Lucilla Lame, MD;  Location: [place];  Service: Endoscopy;  Laterality: N/A;  diabetic - diet controlled   COLONOSCOPY WITH  PROPOFOL N/A 06/27/2020   Procedure: COLONOSCOPY WITH BIOPSY;  Surgeon: Lucilla Lame, MD;  Location: [place];  Service: Endoscopy;  Laterality: N/A;  priority 4   ESOPHAGOGASTRODUODENOSCOPY (EGD) WITH PROPOFOL N/A 08/29/2017   Procedure: ESOPHAGOGASTRODUODENOSCOPY (EGD) WITH PROPOFOL;  Surgeon: Lucilla Lame, MD;  Location: [place];  Service: Endoscopy;  Laterality: N/A;   FOOT SURGERY     right 2nd toe   HIP ARTHROPLASTY Left 05/16/2018   Procedure: ARTHROPLASTY BIPOLAR HIP (HEMIARTHROPLASTY);  Surgeon: Corky Mull, MD;  Location: [facility];  Service: Orthopedics;  Laterality: Left;   ORIF PERIPROSTHETIC FRACTURE Left 12/10/2021   Procedure: OPEN REDUCTION INTERNAL FIXATION OF A LEFT GREATER TROCHANTERIC FRACTURE;  Surgeon: Corky Mull, MD;  Location: [facility];  Service: Orthopedics;  Laterality: Left;   ORIF WRIST FRACTURE Right 10/07/2020   Procedure: OPEN REDUCTION INTERNAL FIXATION (ORIF) WRIST FRACTURE;  Surgeon: Hessie Knows, MD;  Location: [facility];  Service: Orthopedics;  Laterality: Right;   POLYPECTOMY N/A 01/22/2019   Procedure: POLYPECTOMY;  Surgeon: Lucilla Lame, MD;  Location: [place];  Service: Endoscopy;  Laterality: N/A;  2 Clips placed at polyp removal site in ascending colon   POLYPECTOMY N/A 06/27/2020   Procedure: POLYPECTOMY;  Surgeon: Lucilla Lame, MD;  Location: [place];  Service: Endoscopy;  Laterality: N/A;   TUBAL LIGATION     UPPER GASTROINTESTINAL ENDOSCOPY     Patient Active Problem List   Diagnosis Date Noted   Trochanteric avulsion fracture of femur ([place]) 12/10/2021   Personal history of colonic polyps    Polyp of transverse colon    Major depressive disorder, recurrent, mild ([place]) 04/08/2020   Loss of weight    Polyp of ascending colon    Moderate protein-calorie malnutrition ([place]) 09/25/2018   Tubular adenoma 09/25/2018   Adult idiopathic generalized osteoporosis 08/03/2018   Aortic atherosclerosis ([place])  08/03/2018   Panlobular emphysema ([place]) 08/03/2018   Cellulitis of left hip 06/01/2018   Status post hip hemiarthroplasty 05/17/2018   Hip fracture ([place]) 05/15/2018   Tobacco abuse 04/04/2018   DM type 2 with diabetic mixed hyperlipidemia ([place]) 03/08/2018   Low serum vitamin D 09/09/2017   History of Barrett's esophagus    Esophagitis, unspecified    PAD (peripheral artery disease) ([place]) 10/15/2016   Carotid stenosis 10/15/2016   Pain in toe 09/17/2016   Renal cyst, acquired, left 09/13/2016   Medicare annual wellness visit, initial 09/03/2016   Type 2 diabetes mellitus with peripheral angiopathy ([place]) 08/29/2014   Combined fat and carbohydrate induced hyperlipemia 08/29/2014   Incomplete emptying of bladder 07/24/2014   Increased frequency of urination 03/19/2014   Lower abdominal pain 03/02/2014   Microscopic hematuria 03/01/2014   Nocturia 03/01/2014   Urinary tract infection, site not specified 03/01/2014   Diarrhea 03/05/2013   COLONIC POLYPS, ADENOMATOUS 09/05/2007   Hyperlipidemia 09/05/2007   IRON DEFICIENCY 09/05/2007   BARRETTS ESOPHAGUS 09/05/2007   DIVERTICULOSIS, COLON 09/05/2007   IRRITABLE BOWEL SYNDROME 09/05/2007   ARTERIOVENOUS MALFORMATION 09/05/2007    PCP: Dr. Emily Filbert  REFERRING PROVIDER: Marlene Lard  REFERRING DIAG:  [phone] (ICD-10-CM) - Spondylosis without myelopathy or radiculopathy, lumbar region  S72.112D (ICD-10-CM) - Displaced fracture of greater trochanter of left femur, subsequent encounter for closed fracture with routine healing  Z96.649 (ICD-10-CM) - Presence of unspecified artificial hip joint  M79.605 (ICD-10-CM) - Pain in left leg    Rationale for Evaluation and Treatment: Rehabilitation  THERAPY DIAG:  Difficulty in walking, not elsewhere classified  Abnormality of gait and mobility  Muscle weakness (generalized)  Pain in left leg  ONSET DATE: June 2023  SUBJECTIVE:  SUBJECTIVE STATEMENT: Patient reports nagging left LE pain. States she had a busy weekend and didn't get her exercises in.  PERTINENT HISTORY:  Patient is a 77 year old female with referral for PT for lumbar spondylosis and radicular symptoms as well as left Hip surgeries (Left Hemiarthroplasty in 2019 from fall and ORIF in April 2023 from another fall). Patient denies any other falls since then and states she was doing well immediately after her last surgery but began to experience some increased Left LE pain from thigh down to calf- intermittent - rates at 5-6/10 with some very infrequent sharp left lateral hip pain. She reports pain as "like a headache in my leg" and states pain worsens with standing/walking.   PAIN:  Are you having pain? Yes: NPRS scale: 0/10 at rest and 5-6/10 pain going down left LE and very sharp infrequent left hip pain-10/10 Pain location: anteriolateral Left thigh down into calf Pain description: ache Aggravating factors: sleeping on left side, Walking Relieving factors: Rest, tylenol, pain relieving gel- [employer]  PRECAUTIONS: Fall  WEIGHT BEARING RESTRICTIONS: No  FALLS:  Has patient fallen in last 6 months? No and several falls in 2023  LIVING ENVIRONMENT: Lives with: lives alone Lives in: House/apartment Stairs: No Has following equipment at home: Single point cane, Environmental consultant - 2 wheeled, Wheelchair (manual), and Flat entry  OCCUPATION: Retired Tax adviser  PLOF: Independent  PATIENT GOALS: Stop the pain and be able to walk more  NEXT MD VISIT: Unsure  OBJECTIVE:   DIAGNOSTIC FINDINGS:  Narrative & Impression  CLINICAL DATA:  Fall and pain.   EXAM: DG HIP (WITH OR WITHOUT PELVIS) 2-3V LEFT   COMPARISON:  Left hip radiographs dating back to 12/06/2021.   FINDINGS: Left the prosthesis appears intact.  Status post prior fixation of fracture of greater trochanter of the left femur with fixation plate and cerclage wire. No new fracture. No dislocation. The bones are osteopenic. The soft tissues are unremarkable. Vascular calcifications noted multiple cutaneous staples noted over the left hip.   IMPRESSION: No acute fracture.     Electronically Signed   By: Anner Crete M.D.   On: 12/13/2021 00:31    PATIENT SURVEYS:  FOTO 52/predicted value = 59     SCREENING FOR RED FLAGS: Bowel or bladder incontinence: No Spinal tumors: No Cauda equina syndrome: No Compression fracture: No Abdominal aneurysm: No  COGNITION: Overall cognitive status: Within functional limits for tasks assessed     SENSATION: Light touch: Impaired   MUSCLE LENGTH: Hamstrings: Right 90 deg; Left 75 deg    POSTURE: No Significant postural limitations  PALPATION: No tenderness along left greater trochanter region today.   LUMBAR ROM:   AROM eval  Flexion Fingers to ankles  Extension WNL  Right lateral flexion Fingers to fibular head  Left lateral flexion Fingers to mid calf  Right rotation Painful yet WNL  Left rotation WNL   (Blank rows = not tested)   LUMBAR SPECIAL TESTS:  Straight leg raise test: Negative and FABER test: Negative  FUNCTIONAL TESTS:  6 minute walk test: 750 feet in 3 min 20 sec - Stopped due to 6/10 left LE radiuclar pain down to calf  GAIT: Distance walked: 750 Assistive device utilized:  None Level of assistance: SBA Comments: Initially slow reciprocal steps- mild unsteadiness yet after 1 min walking- observed significant increase in limp on left LE   Blood pressure- 144/76 mmHg Left UE sitting  TODAY'S TREATMENT:  DATE: 09/27/2022  Therex:  Standing warm up- B hip flex/ext in // bars x 10 reps each LE; Hip circles CW/CCW x 10 reps  each LE Step up onto 6" block with min BUE support x 10 reps each LE.  Lateral Step up onto 6in block x 10 each LE.  Seated Knee ext with 3#AW x 10 reps Hamstring (seated) BLE x 30 sec x 2 Standing hip flexor stretch on stool x 30 sec x 2. Resistive Gait with 3# AW x 170 feet. Standing hip abd with 3#AW x 10 Standing hip ext  with 3#AW x 10 Single leg stance - Patient performed multiple attempts holding 5-10 sec each LE.   PATIENT EDUCATION:  Education details: Purpose of PT, anatomy of lumbar/hip region, Def of radiuculopathy, Instruction in HEP Person educated: Patient Education method: Explanation, Demonstration, Tactile cues, Verbal cues, and Handouts Education comprehension: verbalized understanding, returned demonstration, verbal cues required, tactile cues required, and needs further education  HOME EXERCISE PROGRAM: Access Code: [email or web address] URL: [email or web address] Date: 09/23/2022 Prepared by: Sande Brothers  Exercises - Supine Gluteal Sets  - 1 x daily - 3 x weekly - 3 sets - 10 reps - Supine Hip Abduction  - 1 x daily - 3 x weekly - 3 sets - 10 reps - Supine Isometric Hip Adduction with Pillow at Knees  - 1 x daily - 3 x weekly - 3 sets - 10 reps - Quad Set  - 1 x daily - 3 x weekly - 3 sets - 10 reps - Supine Heel Slide  - 1 x daily - 3 x weekly - 3 sets - 10 reps     Access Code: [email or web address] URL: [email or web address] Date: 09/20/2022 Prepared by: Sande Brothers  Exercises - Sidelying Hip Flexor Stretch with Caregiver  - 1 x daily - 7 x weekly - 3 sets - 30 hold - Seated Hamstring Stretch  - 1 x daily - 7 x weekly - 3 sets - 30 hold  ASSESSMENT:  CLINICAL IMPRESSION: Treatment continued per plan of care focusing on left side hip/LE strengthening. She was able to progress to more weight bearing activities- able to complete all therex with VC and visual demonstration without complaint of increased pain yet patient reported- "I  know I have worked this leg good." She was receptive to all VC and performed well with left LE standing and active/resistive activities.  Patient will continue to benefit from skilled PT services to improve her functional LE strength and mobility for optimal functional independence and decreased risk of falling.   OBJECTIVE IMPAIRMENTS: Abnormal gait, decreased activity tolerance, decreased balance, decreased endurance, decreased mobility, difficulty walking, decreased ROM, decreased strength, hypomobility, impaired perceived functional ability, impaired flexibility, and pain.   ACTIVITY LIMITATIONS: carrying, lifting, bending, standing, squatting, sleeping, stairs, transfers, and bed mobility  PARTICIPATION LIMITATIONS: cleaning, laundry, shopping, community activity, and yard work  PERSONAL FACTORS: Time since onset of injury/illness/exacerbation and 3+ comorbidities: arthritis, DM, previous surgeries to left hip  are also affecting patient's functional outcome.   REHAB POTENTIAL: Good  CLINICAL DECISION MAKING: Evolving/moderate complexity  EVALUATION COMPLEXITY: Moderate   GOALS: Goals reviewed with patient? Yes  SHORT TERM GOALS: Target date: 11/01/2022  Pt will be independent with HEP in order to improve strength and balance in order to decrease fall risk and improve function at home. Baseline: Eval- no formal HEP in place Goal status: INITIAL  2.  Pt will decrease worst back/LLE pain as reported on NPRS by at least 2  points in order to demonstrate clinically significant reduction in back pain.  Baseline: EVAL= 6/10 LLE pain Goal status: INITIAL   LONG TERM GOALS: Target date: 12/13/2022  Pt will improve FOTO to target score of 59 to display perceived improvements in ability to complete ADL's.  Baseline: Eval = 52 Goal status: INITIAL  2.  Pt will increase 10MWT by at least 0.13 m/s in order to demonstrate clinically significant improvement in community ambulation.    Baseline: EVAL= 0.7 m/s Goal status: INITIAL  3.  Pt will increase 6MWT (able to complete the test and walk > 1200 feet)  in order to demonstrate clinically significant improvement in cardiopulmonary endurance and community ambulation  Baseline: EVAL= 750 feet in 3 min 20 sec Goal status: INITIAL  4.  Pt will decrease worst back/LLE pain as reported on NPRS by at least 3 points in order to demonstrate clinically significant reduction in back pain.  Baseline: EVAL= 6/10 with walking Left LE pain Goal status: INITIAL  5.  Patient will demonstrate improved ability to stand > 30 min without rest break for improved functional strength and independence with activities.  Baseline: EVAL= Patient unstable to stand > 6 min during eval- complaining of increased LLE pain with standing walking. Goal status: INITIAL   PLAN:  PT FREQUENCY: 1-2x/week  PT DURATION: 12 weeks  PLANNED INTERVENTIONS: Therapeutic exercises, Therapeutic activity, Neuromuscular re-education, Balance training, Gait training, Patient/Family education, Self Care, Joint mobilization, Joint manipulation, Stair training, Vestibular training, Canalith repositioning, DME instructions, Dry Needling, Electrical stimulation, Spinal manipulation, Spinal mobilization, Cryotherapy, Moist heat, Taping, and Manual therapy.  PLAN FOR NEXT SESSION: Continue with LE strengthening and  Pain management strategies.    Lewis Moccasin, PT 09/27/2022, 8:44 AM

## 2022-09-28 ENCOUNTER — Ambulatory Visit: Payer: Medicare PPO | Admitting: Urology

## 2022-09-28 ENCOUNTER — Ambulatory Visit: Payer: PPO | Admitting: Physician Assistant

## 2022-09-29 ENCOUNTER — Ambulatory Visit: Payer: PPO | Admitting: Physician Assistant

## 2022-10-04 ENCOUNTER — Ambulatory Visit: Payer: PPO

## 2022-10-04 DIAGNOSIS — H353221 Exudative age-related macular degeneration, left eye, with active choroidal neovascularization: Secondary | ICD-10-CM | POA: Diagnosis not present

## 2022-10-04 DIAGNOSIS — R269 Unspecified abnormalities of gait and mobility: Secondary | ICD-10-CM

## 2022-10-04 DIAGNOSIS — M6281 Muscle weakness (generalized): Secondary | ICD-10-CM

## 2022-10-04 DIAGNOSIS — R262 Difficulty in walking, not elsewhere classified: Secondary | ICD-10-CM

## 2022-10-04 DIAGNOSIS — M79605 Pain in left leg: Secondary | ICD-10-CM

## 2022-10-04 NOTE — Therapy (Signed)
OUTPATIENT PHYSICAL THERAPY THORACOLUMBAR/HIP EVALUATION   Patient Name: Yolanda Jimenez MRN: [id number] DOB:Jul 09, 1946, 77 y.o., female Today's Date: 10/04/2022  END OF SESSION:  PT End of Session - 10/04/22 0800     Visit Number 4    Number of Visits 24    Date for PT Re-Evaluation 12/13/22    Progress Note Due on Visit 10    PT Start Time 0800    PT Stop Time 0834    PT Time Calculation (min) 34 min    Equipment Utilized During Treatment Gait belt    Activity Tolerance Patient tolerated treatment well;No increased pain    Behavior During Therapy WFL for tasks assessed/performed              Past Medical History:  Diagnosis Date   A-fib ([place])    Aortic atherosclerosis (HCC)    Arthritis    back   Asthma    AVM (arteriovenous malformation)    Barrett esophagus    C. difficile diarrhea    Carotid stenosis    Complication of anesthesia    very hard to come out of anesthesia and pt states she gets very" loopy" after surgeries   COPD (chronic obstructive pulmonary disease) (HCC)    no inhalers   Depression    Diabetes mellitus without complication (HCC)    borderline   Diverticulosis    GERD (gastroesophageal reflux disease)    Heart murmur    Hx of adenomatous colonic polyps    Hyperlipidemia    IBS (irritable bowel syndrome)    Insomnia    Iron deficiency anemia    Kidney cysts    hx UTIs   Macular degeneration    left eye   Microscopic hematuria    Osteoporosis    mild   PVD (peripheral vascular disease) ([place])    Wears dentures    partial lower   Past Surgical History:  Procedure Laterality Date   BUNIONECTOMY     right foot   CATARACT EXTRACTION     both eyes   CERVICAL CONE BIOPSY     CHOLECYSTECTOMY     COLONOSCOPY     COLONOSCOPY WITH PROPOFOL N/A 01/22/2019   Procedure: COLONOSCOPY WITH BIOPSY;  Surgeon: Lucilla Lame, MD;  Location: [facility];  Service: Endoscopy;  Laterality: N/A;  diabetic - diet controlled   COLONOSCOPY WITH  PROPOFOL N/A 06/27/2020   Procedure: COLONOSCOPY WITH BIOPSY;  Surgeon: Lucilla Lame, MD;  Location: [place];  Service: Endoscopy;  Laterality: N/A;  priority 4   ESOPHAGOGASTRODUODENOSCOPY (EGD) WITH PROPOFOL N/A 08/29/2017   Procedure: ESOPHAGOGASTRODUODENOSCOPY (EGD) WITH PROPOFOL;  Surgeon: Lucilla Lame, MD;  Location: [facility];  Service: Endoscopy;  Laterality: N/A;   FOOT SURGERY     right 2nd toe   HIP ARTHROPLASTY Left 05/16/2018   Procedure: ARTHROPLASTY BIPOLAR HIP (HEMIARTHROPLASTY);  Surgeon: Corky Mull, MD;  Location: [facility];  Service: Orthopedics;  Laterality: Left;   ORIF PERIPROSTHETIC FRACTURE Left 12/10/2021   Procedure: OPEN REDUCTION INTERNAL FIXATION OF A LEFT GREATER TROCHANTERIC FRACTURE;  Surgeon: Corky Mull, MD;  Location: [facility];  Service: Orthopedics;  Laterality: Left;   ORIF WRIST FRACTURE Right 10/07/2020   Procedure: OPEN REDUCTION INTERNAL FIXATION (ORIF) WRIST FRACTURE;  Surgeon: Hessie Knows, MD;  Location: [facility];  Service: Orthopedics;  Laterality: Right;   POLYPECTOMY N/A 01/22/2019   Procedure: POLYPECTOMY;  Surgeon: Lucilla Lame, MD;  Location: [place];  Service: Endoscopy;  Laterality: N/A;  2 Clips placed at polyp removal site in ascending colon   POLYPECTOMY N/A 06/27/2020   Procedure: POLYPECTOMY;  Surgeon: Lucilla Lame, MD;  Location: [place];  Service: Endoscopy;  Laterality: N/A;   TUBAL LIGATION     UPPER GASTROINTESTINAL ENDOSCOPY     Patient Active Problem List   Diagnosis Date Noted   Trochanteric avulsion fracture of femur ([place]) 12/10/2021   Personal history of colonic polyps    Polyp of transverse colon    Major depressive disorder, recurrent, mild ([place]) 04/08/2020   Loss of weight    Polyp of ascending colon    Moderate protein-calorie malnutrition ([place]) 09/25/2018   Tubular adenoma 09/25/2018   Adult idiopathic generalized osteoporosis 08/03/2018   Aortic atherosclerosis ([place])  08/03/2018   Panlobular emphysema ([place]) 08/03/2018   Cellulitis of left hip 06/01/2018   Status post hip hemiarthroplasty 05/17/2018   Hip fracture ([place]) 05/15/2018   Tobacco abuse 04/04/2018   DM type 2 with diabetic mixed hyperlipidemia ([place]) 03/08/2018   Low serum vitamin D 09/09/2017   History of Barrett's esophagus    Esophagitis, unspecified    PAD (peripheral artery disease) ([place]) 10/15/2016   Carotid stenosis 10/15/2016   Pain in toe 09/17/2016   Renal cyst, acquired, left 09/13/2016   Medicare annual wellness visit, initial 09/03/2016   Type 2 diabetes mellitus with peripheral angiopathy ([place]) 08/29/2014   Combined fat and carbohydrate induced hyperlipemia 08/29/2014   Incomplete emptying of bladder 07/24/2014   Increased frequency of urination 03/19/2014   Lower abdominal pain 03/02/2014   Microscopic hematuria 03/01/2014   Nocturia 03/01/2014   Urinary tract infection, site not specified 03/01/2014   Diarrhea 03/05/2013   COLONIC POLYPS, ADENOMATOUS 09/05/2007   Hyperlipidemia 09/05/2007   IRON DEFICIENCY 09/05/2007   BARRETTS ESOPHAGUS 09/05/2007   DIVERTICULOSIS, COLON 09/05/2007   IRRITABLE BOWEL SYNDROME 09/05/2007   ARTERIOVENOUS MALFORMATION 09/05/2007    PCP: Dr. Emily Filbert  REFERRING PROVIDER: Marlene Lard  REFERRING DIAG:  [phone] (ICD-10-CM) - Spondylosis without myelopathy or radiculopathy, lumbar region  S72.112D (ICD-10-CM) - Displaced fracture of greater trochanter of left femur, subsequent encounter for closed fracture with routine healing  Z96.649 (ICD-10-CM) - Presence of unspecified artificial hip joint  M79.605 (ICD-10-CM) - Pain in left leg    Rationale for Evaluation and Treatment: Rehabilitation  THERAPY DIAG:  Difficulty in walking, not elsewhere classified  Abnormality of gait and mobility  Muscle weakness (generalized)  Pain in left leg  ONSET DATE: June 2023  SUBJECTIVE:  SUBJECTIVE STATEMENT: Patient reports pain continues with anything other than short distance walking. Reports no pain til she walked from parking lot to gym.    PERTINENT HISTORY:  Patient is a 77 year old female with referral for PT for lumbar spondylosis and radicular symptoms as well as left Hip surgeries (Left Hemiarthroplasty in 2019 from fall and ORIF in April 2023 from another fall). Patient denies any other falls since then and states she was doing well immediately after her last surgery but began to experience some increased Left LE pain from thigh down to calf- intermittent - rates at 5-6/10 with some very infrequent sharp left lateral hip pain. She reports pain as "like a headache in my leg" and states pain worsens with standing/walking.   PAIN:  Are you having pain? Yes: NPRS scale: 0/10 at rest and 5-6/10 pain going down left LE and very sharp infrequent left hip pain-10/10 Pain location: anteriolateral Left thigh down into calf Pain description: ache Aggravating factors: sleeping on left side, Walking Relieving factors: Rest, tylenol, pain relieving gel- [employer]  PRECAUTIONS: Fall  WEIGHT BEARING RESTRICTIONS: No  FALLS:  Has patient fallen in last 6 months? No and several falls in 2023  LIVING ENVIRONMENT: Lives with: lives alone Lives in: House/apartment Stairs: No Has following equipment at home: Single point cane, Environmental consultant - 2 wheeled, Wheelchair (manual), and Flat entry  OCCUPATION: Retired Tax adviser  PLOF: Independent  PATIENT GOALS: Stop the pain and be able to walk more  NEXT MD VISIT: Unsure  OBJECTIVE:   DIAGNOSTIC FINDINGS:  Narrative & Impression  CLINICAL DATA:  Fall and pain.   EXAM: DG HIP (WITH OR WITHOUT PELVIS) 2-3V LEFT   COMPARISON:  Left hip radiographs dating back to 12/06/2021.    FINDINGS: Left the prosthesis appears intact. Status post prior fixation of fracture of greater trochanter of the left femur with fixation plate and cerclage wire. No new fracture. No dislocation. The bones are osteopenic. The soft tissues are unremarkable. Vascular calcifications noted multiple cutaneous staples noted over the left hip.   IMPRESSION: No acute fracture.     Electronically Signed   By: Anner Crete M.D.   On: 12/13/2021 00:31    PATIENT SURVEYS:  FOTO 52/predicted value = 59     SCREENING FOR RED FLAGS: Bowel or bladder incontinence: No Spinal tumors: No Cauda equina syndrome: No Compression fracture: No Abdominal aneurysm: No  COGNITION: Overall cognitive status: Within functional limits for tasks assessed     SENSATION: Light touch: Impaired   MUSCLE LENGTH: Hamstrings: Right 90 deg; Left 75 deg    POSTURE: No Significant postural limitations  PALPATION: No tenderness along left greater trochanter region today.   LUMBAR ROM:   AROM eval  Flexion Fingers to ankles  Extension WNL  Right lateral flexion Fingers to fibular head  Left lateral flexion Fingers to mid calf  Right rotation Painful yet WNL  Left rotation WNL   (Blank rows = not tested)   LUMBAR SPECIAL TESTS:  Straight leg raise test: Negative and FABER test: Negative  FUNCTIONAL TESTS:  6 minute walk test: 750 feet in 3 min 20 sec - Stopped due to 6/10 left LE radiuclar pain down to calf  GAIT: Distance walked: 750 Assistive device utilized:  None Level of assistance: SBA Comments: Initially slow reciprocal steps- mild unsteadiness yet after 1 min walking- observed significant increase in limp on left LE   Blood pressure- 144/76 mmHg Left UE sitting  TODAY'S TREATMENT:  DATE: 10/04/2022  Therex:  Nustep LE only L0 x 2 min Sit to stand  without UE support- arms crossed Seated ham curl with RTB x 12 reps Gastroc lift (Toes on 1/2 foam)  Toe lift (heels on 1/2 foam)  Lateral Step up onto 6in block x 10 each LE. (No UE Support)  Seated LE swing (knee extended) up and over cone x 12 reps Standing hip abd up/over 1/2 foam x 12 Stepping over 1/2 foam then back (hip ext) x 10  NMR:  Tandem standing at support bar- attempting to hold up to 30 sec each LE x multiple attempts- Patient with more difficulty with left LE in back position.  Single leg stance - Patient performed multiple attempts holding 5-10 sec each LE.   PATIENT EDUCATION:  Education details: Purpose of PT, anatomy of lumbar/hip region, Def of radiuculopathy, Instruction in HEP Person educated: Patient Education method: Explanation, Demonstration, Tactile cues, Verbal cues, and Handouts Education comprehension: verbalized understanding, returned demonstration, verbal cues required, tactile cues required, and needs further education  HOME EXERCISE PROGRAM: Access Code: [email or web address] URL: [email or web address] Date: 09/23/2022 Prepared by: Sande Brothers  Exercises - Supine Gluteal Sets  - 1 x daily - 3 x weekly - 3 sets - 10 reps - Supine Hip Abduction  - 1 x daily - 3 x weekly - 3 sets - 10 reps - Supine Isometric Hip Adduction with Pillow at Knees  - 1 x daily - 3 x weekly - 3 sets - 10 reps - Quad Set  - 1 x daily - 3 x weekly - 3 sets - 10 reps - Supine Heel Slide  - 1 x daily - 3 x weekly - 3 sets - 10 reps     Access Code: [email or web address] URL: [email or web address] Date: 09/20/2022 Prepared by: Sande Brothers  Exercises - Sidelying Hip Flexor Stretch with Caregiver  - 1 x daily - 7 x weekly - 3 sets - 30 hold - Seated Hamstring Stretch  - 1 x daily - 7 x weekly - 3 sets - 30 hold  ASSESSMENT:  CLINICAL IMPRESSION: Patient arrived with good motivation. Continued focus on LE strengthening with slow progression with  weight bearing and hip strengthening. Patient fatigues quickly and limited reps performed to avoid pain today. She was most challenged with stabilization in tandem and single leg positions. Patient will continue to benefit from skilled PT services to improve her functional LE strength and mobility for optimal functional independence and decreased risk of falling.   OBJECTIVE IMPAIRMENTS: Abnormal gait, decreased activity tolerance, decreased balance, decreased endurance, decreased mobility, difficulty walking, decreased ROM, decreased strength, hypomobility, impaired perceived functional ability, impaired flexibility, and pain.   ACTIVITY LIMITATIONS: carrying, lifting, bending, standing, squatting, sleeping, stairs, transfers, and bed mobility  PARTICIPATION LIMITATIONS: cleaning, laundry, shopping, community activity, and yard work  PERSONAL FACTORS: Time since onset of injury/illness/exacerbation and 3+ comorbidities: arthritis, DM, previous surgeries to left hip  are also affecting patient's functional outcome.   REHAB POTENTIAL: Good  CLINICAL DECISION MAKING: Evolving/moderate complexity  EVALUATION COMPLEXITY: Moderate   GOALS: Goals reviewed with patient? Yes  SHORT TERM GOALS: Target date: 11/01/2022  Pt will be independent with HEP in order to improve strength and balance in order to decrease fall risk and improve function at home. Baseline: Eval- no formal HEP in place Goal status: INITIAL  2.  Pt will decrease worst back/LLE pain as reported on NPRS by at least 2 points in order to demonstrate clinically significant reduction in  back pain.  Baseline: EVAL= 6/10 LLE pain Goal status: INITIAL   LONG TERM GOALS: Target date: 12/13/2022  Pt will improve FOTO to target score of 59 to display perceived improvements in ability to complete ADL's.  Baseline: Eval = 52 Goal status: INITIAL  2.  Pt will increase 10MWT by at least 0.13 m/s in order to demonstrate clinically  significant improvement in community ambulation.   Baseline: EVAL= 0.7 m/s Goal status: INITIAL  3.  Pt will increase 6MWT (able to complete the test and walk > 1200 feet)  in order to demonstrate clinically significant improvement in cardiopulmonary endurance and community ambulation  Baseline: EVAL= 750 feet in 3 min 20 sec Goal status: INITIAL  4.  Pt will decrease worst back/LLE pain as reported on NPRS by at least 3 points in order to demonstrate clinically significant reduction in back pain.  Baseline: EVAL= 6/10 with walking Left LE pain Goal status: INITIAL  5.  Patient will demonstrate improved ability to stand > 30 min without rest break for improved functional strength and independence with activities.  Baseline: EVAL= Patient unstable to stand > 6 min during eval- complaining of increased LLE pain with standing walking. Goal status: INITIAL   PLAN:  PT FREQUENCY: 1-2x/week  PT DURATION: 12 weeks  PLANNED INTERVENTIONS: Therapeutic exercises, Therapeutic activity, Neuromuscular re-education, Balance training, Gait training, Patient/Family education, Self Care, Joint mobilization, Joint manipulation, Stair training, Vestibular training, Canalith repositioning, DME instructions, Dry Needling, Electrical stimulation, Spinal manipulation, Spinal mobilization, Cryotherapy, Moist heat, Taping, and Manual therapy.  PLAN FOR NEXT SESSION: Continue with LE strengthening and  Pain management strategies.    Lewis Moccasin, PT 10/04/2022, 8:47 AM

## 2022-10-06 ENCOUNTER — Ambulatory Visit: Payer: PPO

## 2022-10-06 DIAGNOSIS — M6281 Muscle weakness (generalized): Secondary | ICD-10-CM

## 2022-10-06 DIAGNOSIS — R269 Unspecified abnormalities of gait and mobility: Secondary | ICD-10-CM | POA: Diagnosis not present

## 2022-10-06 DIAGNOSIS — R262 Difficulty in walking, not elsewhere classified: Secondary | ICD-10-CM

## 2022-10-06 DIAGNOSIS — M79605 Pain in left leg: Secondary | ICD-10-CM

## 2022-10-06 NOTE — Therapy (Signed)
OUTPATIENT PHYSICAL THERAPY THORACOLUMBAR/HIP TREATMENT   Patient Name: Yolanda Jimenez MRN: [id number] DOB:01/21/1946, 77 y.o., female Today's Date: 10/06/2022  END OF SESSION:  PT End of Session - 10/06/22 1022     Visit Number 5    Number of Visits 24    Date for PT Re-Evaluation 12/13/22    Progress Note Due on Visit 10    PT Start Time 1017    PT Stop Time 1059    PT Time Calculation (min) 42 min    Equipment Utilized During Treatment Gait belt    Activity Tolerance Patient tolerated treatment well;No increased pain    Behavior During Therapy WFL for tasks assessed/performed              Past Medical History:  Diagnosis Date   A-fib ([place])    Aortic atherosclerosis (HCC)    Arthritis    back   Asthma    AVM (arteriovenous malformation)    Barrett esophagus    C. difficile diarrhea    Carotid stenosis    Complication of anesthesia    very hard to come out of anesthesia and pt states she gets very" loopy" after surgeries   COPD (chronic obstructive pulmonary disease) (HCC)    no inhalers   Depression    Diabetes mellitus without complication (HCC)    borderline   Diverticulosis    GERD (gastroesophageal reflux disease)    Heart murmur    Hx of adenomatous colonic polyps    Hyperlipidemia    IBS (irritable bowel syndrome)    Insomnia    Iron deficiency anemia    Kidney cysts    hx UTIs   Macular degeneration    left eye   Microscopic hematuria    Osteoporosis    mild   PVD (peripheral vascular disease) ([place])    Wears dentures    partial lower   Past Surgical History:  Procedure Laterality Date   BUNIONECTOMY     right foot   CATARACT EXTRACTION     both eyes   CERVICAL CONE BIOPSY     CHOLECYSTECTOMY     COLONOSCOPY     COLONOSCOPY WITH PROPOFOL N/A 01/22/2019   Procedure: COLONOSCOPY WITH BIOPSY;  Surgeon: Lucilla Lame, MD;  Location: [place];  Service: Endoscopy;  Laterality: N/A;  diabetic - diet controlled   COLONOSCOPY WITH  PROPOFOL N/A 06/27/2020   Procedure: COLONOSCOPY WITH BIOPSY;  Surgeon: Lucilla Lame, MD;  Location: [place];  Service: Endoscopy;  Laterality: N/A;  priority 4   ESOPHAGOGASTRODUODENOSCOPY (EGD) WITH PROPOFOL N/A 08/29/2017   Procedure: ESOPHAGOGASTRODUODENOSCOPY (EGD) WITH PROPOFOL;  Surgeon: Lucilla Lame, MD;  Location: [place];  Service: Endoscopy;  Laterality: N/A;   FOOT SURGERY     right 2nd toe   HIP ARTHROPLASTY Left 05/16/2018   Procedure: ARTHROPLASTY BIPOLAR HIP (HEMIARTHROPLASTY);  Surgeon: Corky Mull, MD;  Location: [facility];  Service: Orthopedics;  Laterality: Left;   ORIF PERIPROSTHETIC FRACTURE Left 12/10/2021   Procedure: OPEN REDUCTION INTERNAL FIXATION OF A LEFT GREATER TROCHANTERIC FRACTURE;  Surgeon: Corky Mull, MD;  Location: [facility];  Service: Orthopedics;  Laterality: Left;   ORIF WRIST FRACTURE Right 10/07/2020   Procedure: OPEN REDUCTION INTERNAL FIXATION (ORIF) WRIST FRACTURE;  Surgeon: Hessie Knows, MD;  Location: [facility];  Service: Orthopedics;  Laterality: Right;   POLYPECTOMY N/A 01/22/2019   Procedure: POLYPECTOMY;  Surgeon: Lucilla Lame, MD;  Location: [place];  Service: Endoscopy;  Laterality: N/A;  2 Clips placed at polyp removal site in ascending colon   POLYPECTOMY N/A 06/27/2020   Procedure: POLYPECTOMY;  Surgeon: Lucilla Lame, MD;  Location: [place];  Service: Endoscopy;  Laterality: N/A;   TUBAL LIGATION     UPPER GASTROINTESTINAL ENDOSCOPY     Patient Active Problem List   Diagnosis Date Noted   Trochanteric avulsion fracture of femur ([place]) 12/10/2021   Personal history of colonic polyps    Polyp of transverse colon    Major depressive disorder, recurrent, mild ([place]) 04/08/2020   Loss of weight    Polyp of ascending colon    Moderate protein-calorie malnutrition ([place]) 09/25/2018   Tubular adenoma 09/25/2018   Adult idiopathic generalized osteoporosis 08/03/2018   Aortic atherosclerosis ([place])  08/03/2018   Panlobular emphysema ([place]) 08/03/2018   Cellulitis of left hip 06/01/2018   Status post hip hemiarthroplasty 05/17/2018   Hip fracture ([place]) 05/15/2018   Tobacco abuse 04/04/2018   DM type 2 with diabetic mixed hyperlipidemia ([place]) 03/08/2018   Low serum vitamin D 09/09/2017   History of Barrett's esophagus    Esophagitis, unspecified    PAD (peripheral artery disease) ([place]) 10/15/2016   Carotid stenosis 10/15/2016   Pain in toe 09/17/2016   Renal cyst, acquired, left 09/13/2016   Medicare annual wellness visit, initial 09/03/2016   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 Emily Filbert  REFERRING PROVIDER: Marlene Lard  REFERRING DIAG:  [phone] (ICD-10-CM) - Spondylosis without myelopathy or radiculopathy, lumbar region  S72.112D (ICD-10-CM) - Displaced fracture of greater trochanter of left femur, subsequent encounter for closed fracture with routine healing  Z96.649 (ICD-10-CM) - Presence of unspecified artificial hip joint  M79.605 (ICD-10-CM) - Pain in left leg    Rationale for Evaluation and Treatment: Rehabilitation  THERAPY DIAG:  Difficulty in walking, not elsewhere classified  Abnormality of gait and mobility  Muscle weakness (generalized)  Pain in left leg  ONSET DATE: June 2023  SUBJECTIVE:  SUBJECTIVE STATEMENT: Patient reports  she is walking better and further prior to onset on her left LE pain. States she woke up with left plantar foot pain and wearing flats vs. Tennis shoes. States no pain walking in here.     PERTINENT HISTORY:  Patient is a 77 year old female with referral for PT for lumbar spondylosis and radicular symptoms as well as left Hip surgeries (Left Hemiarthroplasty in 2019 from fall and ORIF in April 2023 from another fall). Patient denies any other falls since then and states she was doing well immediately after her last surgery but began to experience some increased Left LE pain from thigh down to calf- intermittent - rates at 5-6/10 with some very infrequent sharp left lateral hip pain. She reports pain as "like a headache in my leg" and states pain worsens with standing/walking.   PAIN:  Are you having pain? Yes: NPRS scale: 0/10 at rest and 5-6/10 pain going down left LE and very sharp infrequent left hip pain-10/10 Pain location: anteriolateral Left thigh down into calf Pain description: ache Aggravating factors: sleeping on left side, Walking Relieving factors: Rest, tylenol, pain relieving gel- [place]  PRECAUTIONS: Fall  WEIGHT BEARING RESTRICTIONS: No  FALLS:  Has patient fallen in last 6 months? No and several falls in 2023  LIVING ENVIRONMENT: Lives with: lives alone Lives in: House/apartment Stairs: No Has following equipment at home: Single point cane, Environmental consultant - 2 wheeled, Wheelchair (manual), and Flat entry  OCCUPATION: Retired Tax adviser  PLOF: Independent  PATIENT GOALS: Stop the pain and be able to walk more  NEXT MD VISIT: Unsure  OBJECTIVE:   DIAGNOSTIC FINDINGS:  Narrative & Impression  CLINICAL DATA:  Fall and pain.   EXAM: DG HIP (WITH OR WITHOUT PELVIS) 2-3V LEFT    COMPARISON:  Left hip radiographs dating back to 12/06/2021.   FINDINGS: Left the prosthesis appears intact. Status post prior fixation of fracture of greater trochanter of the left femur with fixation plate and cerclage wire. No new fracture. No dislocation. The bones are osteopenic. The soft tissues are unremarkable. Vascular calcifications noted multiple cutaneous staples noted over the left hip.   IMPRESSION: No acute fracture.     Electronically Signed   By: Anner Crete M.D.   On: 12/13/2021 00:31    PATIENT SURVEYS:  FOTO 52/predicted value = 59     SCREENING FOR RED FLAGS: Bowel or bladder incontinence: No Spinal tumors: No Cauda equina syndrome: No Compression fracture: No Abdominal aneurysm: No  COGNITION: Overall cognitive status: Within functional limits for tasks assessed     SENSATION: Light touch: Impaired   MUSCLE LENGTH: Hamstrings: Right 90 deg; Left 75 deg    POSTURE: No Significant postural limitations  PALPATION: No tenderness along left greater trochanter region today.   LUMBAR ROM:   AROM eval  Flexion Fingers to ankles  Extension WNL  Right lateral flexion Fingers to fibular head  Left lateral flexion Fingers to mid calf  Right rotation Painful yet WNL  Left rotation WNL   (Blank rows = not tested)   LUMBAR SPECIAL TESTS:  Straight leg raise test: Negative and FABER test: Negative  FUNCTIONAL TESTS:  6 minute walk test: 750 feet in 3 min 20 sec - Stopped due to 6/10 left LE radiuclar pain down to calf  GAIT: Distance walked: 750 Assistive device utilized:  None Level of assistance: SBA Comments: Initially slow reciprocal steps- mild unsteadiness yet after 1 min walking- observed significant increase  in limp on left LE   Blood pressure- 144/76 mmHg Left UE sitting  TODAY'S TREATMENT:                                                                                                                              DATE:  10/06/2022  Therex:   Seated Hip march 3# 2 sets of 10 reps Seated LAQ 3# 2 sets of 10  Seated LE swing (knee flexed) with 3# up and over cone x 12 reps Sit to stand without UE support x 12 reps Calf raises 2 sets x 10 reps  Toe raises 2 sets x 10 reps  Forward step up onto 6in block x 10 reps each LE Lateral Step up onto 6in block x 10 each LE. (No UE Support) Side lunges with BUE Support Step tap onto cone (3 in lateral/mid/medial position) x 10 each LE     PATIENT EDUCATION:  Education details: Purpose of PT, anatomy of lumbar/hip region, Def of radiuculopathy, Instruction in HEP Person educated: Patient Education method: Explanation, Demonstration, Tactile cues, Verbal cues, and Handouts Education comprehension: verbalized understanding, returned demonstration, verbal cues required, tactile cues required, and needs further education  HOME EXERCISE PROGRAM: Access Code: [email or web address] URL: [email or web address] Date: 09/23/2022 Prepared by: Sande Brothers  Exercises - Supine Gluteal Sets  - 1 x daily - 3 x weekly - 3 sets - 10 reps - Supine Hip Abduction  - 1 x daily - 3 x weekly - 3 sets - 10 reps - Supine Isometric Hip Adduction with Pillow at Knees  - 1 x daily - 3 x weekly - 3 sets - 10 reps - Quad Set  - 1 x daily - 3 x weekly - 3 sets - 10 reps - Supine Heel Slide  - 1 x daily - 3 x weekly - 3 sets - 10 reps     Access Code: [email or web address] URL: [email or web address] Date: 09/20/2022 Prepared by: Sande Brothers  Exercises - Sidelying Hip Flexor Stretch with Caregiver  - 1 x daily - 7 x weekly - 3 sets - 30 hold - Seated Hamstring Stretch  - 1 x daily - 7 x weekly - 3 sets - 30 hold  ASSESSMENT:  CLINICAL IMPRESSION: Patient was able to perform all activities without report of any left hip or foot pain today. She was able to add some resistance with some fatigue yet no significant difficulty. Patient was able to lunge and step up well  with left LE with only fatigue as limiting factor. She was able to stand well without UE Support as well today. Patient will continue to benefit from skilled PT services to improve her functional LE strength and mobility for optimal functional independence and decreased risk of falling.   OBJECTIVE IMPAIRMENTS: Abnormal gait, decreased activity tolerance, decreased balance, decreased endurance, decreased mobility, difficulty walking, decreased ROM, decreased strength, hypomobility, impaired perceived functional ability, impaired flexibility, and pain.   ACTIVITY LIMITATIONS: carrying, lifting,  bending, standing, squatting, sleeping, stairs, transfers, and bed mobility  PARTICIPATION LIMITATIONS: cleaning, laundry, shopping, community activity, and yard work  PERSONAL FACTORS: Time since onset of injury/illness/exacerbation and 3+ comorbidities: arthritis, DM, previous surgeries to left hip  are also affecting patient's functional outcome.   REHAB POTENTIAL: Good  CLINICAL DECISION MAKING: Evolving/moderate complexity  EVALUATION COMPLEXITY: Moderate   GOALS: Goals reviewed with patient? Yes  SHORT TERM GOALS: Target date: 11/01/2022  Pt will be independent with HEP in order to improve strength and balance in order to decrease fall risk and improve function at home. Baseline: Eval- no formal HEP in place Goal status: INITIAL  2.  Pt will decrease worst back/LLE pain as reported on NPRS by at least 2 points in order to demonstrate clinically significant reduction in back pain.  Baseline: EVAL= 6/10 LLE pain Goal status: INITIAL   LONG TERM GOALS: Target date: 12/13/2022  Pt will improve FOTO to target score of 59 to display perceived improvements in ability to complete ADL's.  Baseline: Eval = 52 Goal status: INITIAL  2.  Pt will increase 10MWT by at least 0.13 m/s in order to demonstrate clinically significant improvement in community ambulation.   Baseline: EVAL= 0.7 m/s Goal  status: INITIAL  3.  Pt will increase 6MWT (able to complete the test and walk > 1200 feet)  in order to demonstrate clinically significant improvement in cardiopulmonary endurance and community ambulation  Baseline: EVAL= 750 feet in 3 min 20 sec Goal status: INITIAL  4.  Pt will decrease worst back/LLE pain as reported on NPRS by at least 3 points in order to demonstrate clinically significant reduction in back pain.  Baseline: EVAL= 6/10 with walking Left LE pain Goal status: INITIAL  5.  Patient will demonstrate improved ability to stand > 30 min without rest break for improved functional strength and independence with activities.  Baseline: EVAL= Patient unstable to stand > 6 min during eval- complaining of increased LLE pain with standing walking. Goal status: INITIAL   PLAN:  PT FREQUENCY: 1-2x/week  PT DURATION: 12 weeks  PLANNED INTERVENTIONS: Therapeutic exercises, Therapeutic activity, Neuromuscular re-education, Balance training, Gait training, Patient/Family education, Self Care, Joint mobilization, Joint manipulation, Stair training, Vestibular training, Canalith repositioning, DME instructions, Dry Needling, Electrical stimulation, Spinal manipulation, Spinal mobilization, Cryotherapy, Moist heat, Taping, and Manual therapy.  PLAN FOR NEXT SESSION: Continue with LE strengthening and  Pain management strategies.    Lewis Moccasin, PT 10/06/2022, 11:03 AM

## 2022-10-07 DIAGNOSIS — H8111 Benign paroxysmal vertigo, right ear: Secondary | ICD-10-CM | POA: Diagnosis not present

## 2022-10-08 DIAGNOSIS — R112 Nausea with vomiting, unspecified: Secondary | ICD-10-CM | POA: Diagnosis not present

## 2022-10-08 DIAGNOSIS — E1151 Type 2 diabetes mellitus with diabetic peripheral angiopathy without gangrene: Secondary | ICD-10-CM | POA: Diagnosis not present

## 2022-10-08 DIAGNOSIS — R42 Dizziness and giddiness: Secondary | ICD-10-CM | POA: Diagnosis not present

## 2022-10-08 DIAGNOSIS — E1165 Type 2 diabetes mellitus with hyperglycemia: Secondary | ICD-10-CM | POA: Diagnosis not present

## 2022-10-11 ENCOUNTER — Ambulatory Visit: Payer: PPO

## 2022-10-14 ENCOUNTER — Ambulatory Visit: Payer: PPO | Admitting: Physician Assistant

## 2022-10-14 VITALS — BP 104/68 | HR 76 | Ht 61.0 in | Wt 95.0 lb

## 2022-10-14 DIAGNOSIS — R3129 Other microscopic hematuria: Secondary | ICD-10-CM | POA: Diagnosis not present

## 2022-10-14 DIAGNOSIS — R351 Nocturia: Secondary | ICD-10-CM

## 2022-10-14 LAB — BLADDER SCAN AMB NON-IMAGING: Scan Result: 21 ml

## 2022-10-14 NOTE — Progress Notes (Signed)
10/14/2022 9:53 AM   [name] Sep 12, 1945 [id number]  CC: Chief Complaint  Patient presents with   Follow-up   HPI: Yolanda Jimenez is a 77 y.o. female with PMH hematuria with benign cystoscopy in February 2023, left renal cyst, recurrent UTIs, LUTS including urgency, frequency, and weak stream, and nocturia who presents today for symptom recheck on Myrbetriq 25 mg daily.  Today she reports persistent nocturia x 3-5 that is interfering with her sleep quality.  She describes particularly large volume voids overnight and states her symptoms did not change on Myrbetriq.  She restricts fluids past 6 PM.  No history of hyponatremia.  PVR 21 mL.  PMH: Past Medical History:  Diagnosis Date   A-fib ([place])    Aortic atherosclerosis ([place])    Arthritis    back   Asthma    AVM (arteriovenous malformation)    Barrett esophagus    C. difficile diarrhea    Carotid stenosis    Complication of anesthesia    very hard to come out of anesthesia and pt states she gets very" loopy" after surgeries   COPD (chronic obstructive pulmonary disease) ([facility])    no inhalers   Depression    Diabetes mellitus without complication ([facility])    borderline   Diverticulosis    GERD (gastroesophageal reflux disease)    Heart murmur    Hx of adenomatous colonic polyps    Hyperlipidemia    IBS (irritable bowel syndrome)    Insomnia    Iron deficiency anemia    Kidney cysts    hx UTIs   Macular degeneration    left eye   Microscopic hematuria    Osteoporosis    mild   PVD (peripheral vascular disease) ([place])    Wears dentures    partial lower    Surgical History: Past Surgical History:  Procedure Laterality Date   BUNIONECTOMY     right foot   CATARACT EXTRACTION     both eyes   CERVICAL CONE BIOPSY     CHOLECYSTECTOMY     COLONOSCOPY     COLONOSCOPY WITH PROPOFOL N/A 01/22/2019   Procedure: COLONOSCOPY WITH BIOPSY;  Surgeon: Lucilla Lame, MD;  Location: [place];  Service:  Endoscopy;  Laterality: N/A;  diabetic - diet controlled   COLONOSCOPY WITH PROPOFOL N/A 06/27/2020   Procedure: COLONOSCOPY WITH BIOPSY;  Surgeon: Lucilla Lame, MD;  Location: [place];  Service: Endoscopy;  Laterality: N/A;  priority 4   ESOPHAGOGASTRODUODENOSCOPY (EGD) WITH PROPOFOL N/A 08/29/2017   Procedure: ESOPHAGOGASTRODUODENOSCOPY (EGD) WITH PROPOFOL;  Surgeon: Lucilla Lame, MD;  Location: [place];  Service: Endoscopy;  Laterality: N/A;   FOOT SURGERY     right 2nd toe   HIP ARTHROPLASTY Left 05/16/2018   Procedure: ARTHROPLASTY BIPOLAR HIP (HEMIARTHROPLASTY);  Surgeon: Corky Mull, MD;  Location: [facility];  Service: Orthopedics;  Laterality: Left;   ORIF PERIPROSTHETIC FRACTURE Left 12/10/2021   Procedure: OPEN REDUCTION INTERNAL FIXATION OF A LEFT GREATER TROCHANTERIC FRACTURE;  Surgeon: Corky Mull, MD;  Location: [facility];  Service: Orthopedics;  Laterality: Left;   ORIF WRIST FRACTURE Right 10/07/2020   Procedure: OPEN REDUCTION INTERNAL FIXATION (ORIF) WRIST FRACTURE;  Surgeon: Hessie Knows, MD;  Location: [facility];  Service: Orthopedics;  Laterality: Right;   POLYPECTOMY N/A 01/22/2019   Procedure: POLYPECTOMY;  Surgeon: Lucilla Lame, MD;  Location: [place];  Service: Endoscopy;  Laterality: N/A;  2 Clips placed at polyp removal site in ascending  colon   POLYPECTOMY N/A 06/27/2020   Procedure: POLYPECTOMY;  Surgeon: Lucilla Lame, MD;  Location: [place];  Service: Endoscopy;  Laterality: N/A;   TUBAL LIGATION     UPPER GASTROINTESTINAL ENDOSCOPY      Home Medications:  Allergies as of 10/14/2022       Reactions   Codeine    "felt drunk, crazy"   Hydrocodone Other (See Comments)   Hydrocodone-acetaminophen Other (See Comments)   Confusion/delirium   Lidocaine    Heart racing, increased BP - from local at dentist (likely epi)   Nsaids Other (See Comments)   Avoids due to IBS symptoms (bleeding symptoms)   Oxycodone     Confusion/delirium        Medication List        Accurate as of October 14, 2022  9:53 AM. If you have any questions, ask your nurse or doctor.          STOP taking these medications    trimethoprim 100 MG tablet Commonly known as: TRIMPEX       TAKE these medications    acetaminophen 500 MG tablet Commonly known as: TYLENOL Take 1,000 mg by mouth every 6 (six) hours as needed.   BRAINSTRONG MEMORY SUPPORT PO Take 2 capsules by mouth 3 (three) times a week. [place]   CALCIUM 600+D3 PO Take 1 tablet by mouth in the morning.   celecoxib 200 MG capsule Commonly known as: CELEBREX Take by mouth.   Cholecalciferol 50 MCG (2000 UT) Caps Take 2,000 Units by mouth in the morning.   CRANBERRY-VITAMIN C PO Take 1 tablet by mouth 3 (three) times a week.   diazepam 5 MG tablet Commonly known as: VALIUM Take 1 tablet (5 mg total) by mouth daily as needed for anxiety. What changed: when to take this   diltiazem 30 MG tablet Commonly known as: CARDIZEM Take 30 mg by mouth daily before breakfast.   ferrous gluconate 240 (27 FE) MG tablet Commonly known as: FERGON Take 240 mg by mouth in the morning.   fluticasone 50 MCG/ACT nasal spray Commonly known as: FLONASE Place 1 spray into both nostrils daily as needed for allergies or rhinitis.   ipratropium 0.03 % nasal spray Commonly known as: ATROVENT SMARTSIG:1-2 Spray(s) Both Nares 3 Times Daily PRN   ipratropium 0.06 % nasal spray Commonly known as: ATROVENT Place into the nose.   loperamide 2 MG capsule Commonly known as: IMODIUM Take 2 mg by mouth every morning.   meclizine 12.5 MG tablet Commonly known as: ANTIVERT Take 12.5 mg by mouth 3 (three) times daily as needed for dizziness.   mirabegron ER 25 MG Tb24 tablet Commonly known as: MYRBETRIQ Take 1 tablet (25 mg total) by mouth daily.   omeprazole 40 MG capsule Commonly known as: PRILOSEC Take 40 mg by mouth  daily before breakfast.   PRESERVISION AREDS 2 PO Take 2 tablets by mouth in the morning.   Prolia 60 MG/ML Sosy injection Generic drug: denosumab Inject 60 mg into the skin every 6 (six) months.   simvastatin 40 MG tablet Commonly known as: ZOCOR Take 40 mg by mouth at bedtime.   STRESS B COMPLEX PO Take 1 capsule by mouth in the morning.   traMADol 50 MG tablet Commonly known as: ULTRAM Take 1-2 tablets (50-100 mg total) by mouth every 6 (six) hours as needed for moderate pain or severe pain.   venlafaxine XR 37.5 MG 24 hr  capsule Commonly known as: EFFEXOR-XR Take 37.5 mg by mouth daily.   vitamin C 1000 MG tablet Take 1,000 mg by mouth in the morning.        Allergies:  Allergies  Allergen Reactions   Codeine     "felt drunk, crazy"   Hydrocodone Other (See Comments)   Hydrocodone-Acetaminophen Other (See Comments)    Confusion/delirium   Lidocaine     Heart racing, increased BP - from local at dentist (likely epi)   Nsaids Other (See Comments)    Avoids due to IBS symptoms (bleeding symptoms)   Oxycodone     Confusion/delirium    Family History: Family History  Problem Relation Age of Onset   Diabetes Mother    Heart disease Mother    Hypertension Mother    Diabetes Son    Diabetes Sister    Inflammatory bowel disease Father        diverticulitis   Multiple myeloma Father    Hypertension Son    Diabetes Maternal Grandmother    Colon cancer Neg Hx    Esophageal cancer Neg Hx    Rectal cancer Neg Hx    Stomach cancer Neg Hx    Breast cancer Neg Hx     Social History:   reports that she has been smoking cigarettes. She has a 27.50 pack-year smoking history. She has never used smokeless tobacco. She reports current alcohol use of about 2.0 standard drinks of alcohol per week. She reports that she does not use drugs.  Physical Exam: BP 104/68   Pulse 76   Ht '5\' 1"'$  (1.549 m)   Wt 95 lb (43.1 kg)   BMI 17.95 kg/m   Constitutional:  Alert and  oriented, no acute distress, nontoxic appearing HEENT: [place], AT Cardiovascular: No clubbing, cyanosis, or edema Respiratory: Normal respiratory effort, no increased work of breathing Skin: No rashes, bruises or suspicious lesions Neurologic: Grossly intact, no focal deficits, moving all 4 extremities Psychiatric: Normal mood and affect  Laboratory Data: Results for orders placed or performed in visit on 10/14/22  BLADDER SCAN AMB NON-IMAGING  Result Value Ref Range   Scan Result 21 ml   Assessment & Plan:   1. Nocturia No improvement on Myrbetriq, will stop this.  She reports multiple large volume voids overnight consistent with nocturnal polyuria.  We again discussed that her symptoms are most consistent with an undiagnosed sleep apnea and I urged her to follow-up with her PCP for consideration of a sleep study.  Alternatively, may consider desmopressin in the future, though we discussed that this will require close monitoring of her sodium.  I would rather treat the underlying source of her symptoms if possible.  2. Microscopic hematuria She is scheduled for annual follow-up with [facility] with Dr. Erlene Quan later this month.  I recently repeated a urinalysis which was notable for persistent microscopic hematuria.  Will move this appointment out 6 months from now.  Return in about 6 months (around 04/16/2023) for Routine follow-up with Dr. Erlene Quan.  Debroah Loop, PA-C  [facility] [address] [place], [place] [address] [phone]

## 2022-10-18 ENCOUNTER — Ambulatory Visit: Payer: PPO | Attending: Student

## 2022-10-18 DIAGNOSIS — M79605 Pain in left leg: Secondary | ICD-10-CM

## 2022-10-18 DIAGNOSIS — R269 Unspecified abnormalities of gait and mobility: Secondary | ICD-10-CM | POA: Diagnosis not present

## 2022-10-18 DIAGNOSIS — R262 Difficulty in walking, not elsewhere classified: Secondary | ICD-10-CM

## 2022-10-18 DIAGNOSIS — M6281 Muscle weakness (generalized): Secondary | ICD-10-CM | POA: Diagnosis not present

## 2022-10-18 NOTE — Therapy (Signed)
OUTPATIENT PHYSICAL THERAPY THORACOLUMBAR/HIP TREATMENT   Patient Name: Yolanda Jimenez MRN: [id number] DOB:1946/08/02, 77 y.o., female Today's Date: 10/18/2022  END OF SESSION:  PT End of Session - 10/18/22 0805     Visit Number 6    Number of Visits 24    Date for PT Re-Evaluation 12/13/22    Progress Note Due on Visit 10    PT Start Time 0805    PT Stop Time 0845    PT Time Calculation (min) 40 min    Equipment Utilized During Treatment Gait belt    Activity Tolerance Patient tolerated treatment well;No increased pain    Behavior During Therapy WFL for tasks assessed/performed              Past Medical History:  Diagnosis Date   A-fib ([place])    Aortic atherosclerosis (HCC)    Arthritis    back   Asthma    AVM (arteriovenous malformation)    Barrett esophagus    C. difficile diarrhea    Carotid stenosis    Complication of anesthesia    very hard to come out of anesthesia and pt states she gets very" loopy" after surgeries   COPD (chronic obstructive pulmonary disease) (HCC)    no inhalers   Depression    Diabetes mellitus without complication (HCC)    borderline   Diverticulosis    GERD (gastroesophageal reflux disease)    Heart murmur    Hx of adenomatous colonic polyps    Hyperlipidemia    IBS (irritable bowel syndrome)    Insomnia    Iron deficiency anemia    Kidney cysts    hx UTIs   Macular degeneration    left eye   Microscopic hematuria    Osteoporosis    mild   PVD (peripheral vascular disease) ([place])    Wears dentures    partial lower   Past Surgical History:  Procedure Laterality Date   BUNIONECTOMY     right foot   CATARACT EXTRACTION     both eyes   CERVICAL CONE BIOPSY     CHOLECYSTECTOMY     COLONOSCOPY     COLONOSCOPY WITH PROPOFOL N/A 01/22/2019   Procedure: COLONOSCOPY WITH BIOPSY;  Surgeon: Lucilla Lame, MD;  Location: [facility];  Service: Endoscopy;  Laterality: N/A;  diabetic - diet controlled   COLONOSCOPY WITH  PROPOFOL N/A 06/27/2020   Procedure: COLONOSCOPY WITH BIOPSY;  Surgeon: Lucilla Lame, MD;  Location: [facility];  Service: Endoscopy;  Laterality: N/A;  priority 4   ESOPHAGOGASTRODUODENOSCOPY (EGD) WITH PROPOFOL N/A 08/29/2017   Procedure: ESOPHAGOGASTRODUODENOSCOPY (EGD) WITH PROPOFOL;  Surgeon: Lucilla Lame, MD;  Location: [facility];  Service: Endoscopy;  Laterality: N/A;   FOOT SURGERY     right 2nd toe   HIP ARTHROPLASTY Left 05/16/2018   Procedure: ARTHROPLASTY BIPOLAR HIP (HEMIARTHROPLASTY);  Surgeon: Corky Mull, MD;  Location: [facility];  Service: Orthopedics;  Laterality: Left;   ORIF PERIPROSTHETIC FRACTURE Left 12/10/2021   Procedure: OPEN REDUCTION INTERNAL FIXATION OF A LEFT GREATER TROCHANTERIC FRACTURE;  Surgeon: Corky Mull, MD;  Location: [facility];  Service: Orthopedics;  Laterality: Left;   ORIF WRIST FRACTURE Right 10/07/2020   Procedure: OPEN REDUCTION INTERNAL FIXATION (ORIF) WRIST FRACTURE;  Surgeon: Hessie Knows, MD;  Location: [facility];  Service: Orthopedics;  Laterality: Right;   POLYPECTOMY N/A 01/22/2019   Procedure: POLYPECTOMY;  Surgeon: Lucilla Lame, MD;  Location: [facility];  Service: Endoscopy;  Laterality: N/A;  2 Clips placed at polyp removal site in ascending colon   POLYPECTOMY N/A 06/27/2020   Procedure: POLYPECTOMY;  Surgeon: Lucilla Lame, MD;  Location: [facility] [facility];  Service: Endoscopy;  Laterality: N/A;   TUBAL LIGATION     UPPER GASTROINTESTINAL ENDOSCOPY     Patient Active Problem List   Diagnosis Date Noted   Trochanteric avulsion fracture of femur ([place]) 12/10/2021   Personal history of colonic polyps    Polyp of transverse colon    Major depressive disorder, recurrent, mild ([place]) 04/08/2020   Loss of weight    Polyp of ascending colon    Moderate protein-calorie malnutrition ([place]) 09/25/2018   Tubular adenoma 09/25/2018   Adult idiopathic generalized osteoporosis 08/03/2018   Aortic atherosclerosis ([place])  08/03/2018   Panlobular emphysema ([place]) 08/03/2018   Cellulitis of left hip 06/01/2018   Status post hip hemiarthroplasty 05/17/2018   Hip fracture ([place]) 05/15/2018   Tobacco abuse 04/04/2018   DM type 2 with diabetic mixed hyperlipidemia ([place]) 03/08/2018   Low serum vitamin D 09/09/2017   History of Barrett's esophagus    Esophagitis, unspecified    PAD (peripheral artery disease) ([place]) 10/15/2016   Carotid stenosis 10/15/2016   Pain in toe 09/17/2016   Renal cyst, acquired, left 09/13/2016   Medicare annual wellness visit, initial 09/03/2016   Type 2 diabetes mellitus with peripheral angiopathy ([place]) 08/29/2014   Combined fat and carbohydrate induced hyperlipemia 08/29/2014   Incomplete emptying of bladder 07/24/2014   Increased frequency of urination 03/19/2014   Lower abdominal pain 03/02/2014   Microscopic hematuria 03/01/2014   Nocturia 03/01/2014   Urinary tract infection, site not specified 03/01/2014   Diarrhea 03/05/2013   COLONIC POLYPS, ADENOMATOUS 09/05/2007   Hyperlipidemia 09/05/2007   IRON DEFICIENCY 09/05/2007   BARRETTS ESOPHAGUS 09/05/2007   DIVERTICULOSIS, COLON 09/05/2007   IRRITABLE BOWEL SYNDROME 09/05/2007   ARTERIOVENOUS MALFORMATION 09/05/2007    PCP: Dr. Emily Filbert  REFERRING PROVIDER: Marlene Lard  REFERRING DIAG:  [phone] (ICD-10-CM) - Spondylosis without myelopathy or radiculopathy, lumbar region  S72.112D (ICD-10-CM) - Displaced fracture of greater trochanter of left femur, subsequent encounter for closed fracture with routine healing  Z96.649 (ICD-10-CM) - Presence of unspecified artificial hip joint  M79.605 (ICD-10-CM) - Pain in left leg    Rationale for Evaluation and Treatment: Rehabilitation  THERAPY DIAG:  Difficulty in walking, not elsewhere classified  Abnormality of gait and mobility  Muscle weakness (generalized)  Pain in left leg  ONSET DATE: June 2023  SUBJECTIVE:  SUBJECTIVE STATEMENT: Patient reports no pain currently. Her LLE became flared up after shopping for a couple hours within the last few days. Reports LLE feeling like it has a "head ache".  PERTINENT HISTORY:  Patient is a 77 year old female with referral for PT for lumbar spondylosis and radicular symptoms as well as left Hip surgeries (Left Hemiarthroplasty in 2019 from fall and ORIF in April 2023 from another fall). Patient denies any other falls since then and states she was doing well immediately after her last surgery but began to experience some increased Left LE pain from thigh down to calf- intermittent - rates at 5-6/10 with some very infrequent sharp left lateral hip pain. She reports pain as "like a headache in my leg" and states pain worsens with standing/walking.   PAIN:  Are you having pain? Yes: NPRS scale: 0/10 at rest and 5-6/10 pain going down left LE and very sharp infrequent left hip pain-10/10 Pain location: anteriolateral Left thigh down into calf Pain description: ache Aggravating factors: sleeping on left side, Walking Relieving factors: Rest, tylenol, pain relieving gel- [employer]  PRECAUTIONS: Fall  WEIGHT BEARING RESTRICTIONS: No  FALLS:  Has patient fallen in last 6 months? No and several falls in 2023  LIVING ENVIRONMENT: Lives with: lives alone Lives in: House/apartment Stairs: No Has following equipment at home: Single point cane, Environmental consultant - 2 wheeled, Wheelchair (manual), and Flat entry  OCCUPATION: Retired Tax adviser  PLOF: Independent  PATIENT GOALS: Stop the pain and be able to walk more  NEXT MD VISIT: Unsure  OBJECTIVE:   DIAGNOSTIC FINDINGS:  Narrative & Impression  CLINICAL DATA:  Fall and pain.   EXAM: DG HIP (WITH OR WITHOUT PELVIS) 2-3V LEFT   COMPARISON:  Left hip radiographs dating back  to 12/06/2021.   FINDINGS: Left the prosthesis appears intact. Status post prior fixation of fracture of greater trochanter of the left femur with fixation plate and cerclage wire. No new fracture. No dislocation. The bones are osteopenic. The soft tissues are unremarkable. Vascular calcifications noted multiple cutaneous staples noted over the left hip.   IMPRESSION: No acute fracture.     Electronically Signed   By: Anner Crete M.D.   On: 12/13/2021 00:31    PATIENT SURVEYS:  FOTO 52/predicted value = 59     SCREENING FOR RED FLAGS: Bowel or bladder incontinence: No Spinal tumors: No Cauda equina syndrome: No Compression fracture: No Abdominal aneurysm: No  COGNITION: Overall cognitive status: Within functional limits for tasks assessed     SENSATION: Light touch: Impaired   MUSCLE LENGTH: Hamstrings: Right 90 deg; Left 75 deg    POSTURE: No Significant postural limitations  PALPATION: No tenderness along left greater trochanter region today.   LUMBAR ROM:   AROM eval  Flexion Fingers to ankles  Extension WNL  Right lateral flexion Fingers to fibular head  Left lateral flexion Fingers to mid calf  Right rotation Painful yet WNL  Left rotation WNL   (Blank rows = not tested)   LUMBAR SPECIAL TESTS:  Straight leg raise test: Negative and FABER test: Negative  FUNCTIONAL TESTS:  6 minute walk test: 750 feet in 3 min 20 sec - Stopped due to 6/10 left LE radiuclar pain down to calf  GAIT: Distance walked: 750 Assistive device utilized:  None Level of assistance: SBA Comments: Initially slow reciprocal steps- mild unsteadiness yet after 1 min walking- observed significant increase in limp on left LE   Blood pressure- 144/76 mmHg  Left UE sitting  TODAY'S TREATMENT:                                                                                                                              DATE: 10/18/2022  Therex:   Seated Hip march 3#  2x10   Seated LAQ 3# 2x10  R/L Lateral hurdle (x2) step overs at support bar SBA: 2x8 with seated rest b/t sets  Sit to stand without UE support 2x12. Requires VC's for eccentric control, limiting momentum. Fair carryover.   Forward and lateral 6" step ups: 2x10/LE forwards, 1x10 lateral in each plane  Standing calf and toe raises: 2x12/side   PATIENT EDUCATION:  Education details: Purpose of PT, anatomy of lumbar/hip region, Def of radiuculopathy, Instruction in HEP Person educated: Patient Education method: Explanation, Demonstration, Tactile cues, Verbal cues, and Handouts Education comprehension: verbalized understanding, returned demonstration, verbal cues required, tactile cues required, and needs further education  HOME EXERCISE PROGRAM: Access Code: [email or web address] URL: [email or web address] Date: 09/23/2022 Prepared by: Sande Brothers  Exercises - Supine Gluteal Sets  - 1 x daily - 3 x weekly - 3 sets - 10 reps - Supine Hip Abduction  - 1 x daily - 3 x weekly - 3 sets - 10 reps - Supine Isometric Hip Adduction with Pillow at Knees  - 1 x daily - 3 x weekly - 3 sets - 10 reps - Quad Set  - 1 x daily - 3 x weekly - 3 sets - 10 reps - Supine Heel Slide  - 1 x daily - 3 x weekly - 3 sets - 10 reps     Access Code: [email or web address] URL: [email or web address] Date: 09/20/2022 Prepared by: Sande Brothers  Exercises - Sidelying Hip Flexor Stretch with Caregiver  - 1 x daily - 7 x weekly - 3 sets - 30 hold - Seated Hamstring Stretch  - 1 x daily - 7 x weekly - 3 sets - 30 hold  ASSESSMENT:  CLINICAL IMPRESSION: Continuing PT POC with transition from sitting to standing strengthening to improve tolerance for standing and walking ADL's. Pt tolerating increased volume and standing tasks this date without reports of worsening LLE pain. Pt does require frequent VC's for slowing exercise for improved form/technique. Pt also requiring frequent seated rest  breaks due to reports of LE fatigue. Patient will continue to benefit from skilled PT services to improve her functional LE strength and mobility for optimal functional independence and decreased risk of falling.    OBJECTIVE IMPAIRMENTS: Abnormal gait, decreased activity tolerance, decreased balance, decreased endurance, decreased mobility, difficulty walking, decreased ROM, decreased strength, hypomobility, impaired perceived functional ability, impaired flexibility, and pain.   ACTIVITY LIMITATIONS: carrying, lifting, bending, standing, squatting, sleeping, stairs, transfers, and bed mobility  PARTICIPATION LIMITATIONS: cleaning, laundry, shopping, community activity, and yard work  PERSONAL FACTORS: Time since onset of injury/illness/exacerbation and 3+ comorbidities: arthritis, DM, previous surgeries to left hip  are also affecting patient's functional outcome.  REHAB POTENTIAL: Good  CLINICAL DECISION MAKING: Evolving/moderate complexity  EVALUATION COMPLEXITY: Moderate   GOALS: Goals reviewed with patient? Yes  SHORT TERM GOALS: Target date: 11/01/2022  Pt will be independent with HEP in order to improve strength and balance in order to decrease fall risk and improve function at home. Baseline: Eval- no formal HEP in place Goal status: INITIAL  2.  Pt will decrease worst back/LLE pain as reported on NPRS by at least 2 points in order to demonstrate clinically significant reduction in back pain.  Baseline: EVAL= 6/10 LLE pain Goal status: INITIAL   LONG TERM GOALS: Target date: 12/13/2022  Pt will improve FOTO to target score of 59 to display perceived improvements in ability to complete ADL's.  Baseline: Eval = 52 Goal status: INITIAL  2.  Pt will increase 10MWT by at least 0.13 m/s in order to demonstrate clinically significant improvement in community ambulation.   Baseline: EVAL= 0.7 m/s Goal status: INITIAL  3.  Pt will increase 6MWT (able to complete the test and  walk > 1200 feet)  in order to demonstrate clinically significant improvement in cardiopulmonary endurance and community ambulation  Baseline: EVAL= 750 feet in 3 min 20 sec Goal status: INITIAL  4.  Pt will decrease worst back/LLE pain as reported on NPRS by at least 3 points in order to demonstrate clinically significant reduction in back pain.  Baseline: EVAL= 6/10 with walking Left LE pain Goal status: INITIAL  5.  Patient will demonstrate improved ability to stand > 30 min without rest break for improved functional strength and independence with activities.  Baseline: EVAL= Patient unstable to stand > 6 min during eval- complaining of increased LLE pain with standing walking. Goal status: INITIAL   PLAN:  PT FREQUENCY: 1-2x/week  PT DURATION: 12 weeks  PLANNED INTERVENTIONS: Therapeutic exercises, Therapeutic activity, Neuromuscular re-education, Balance training, Gait training, Patient/Family education, Self Care, Joint mobilization, Joint manipulation, Stair training, Vestibular training, Canalith repositioning, DME instructions, Dry Needling, Electrical stimulation, Spinal manipulation, Spinal mobilization, Cryotherapy, Moist heat, Taping, and Manual therapy.  PLAN FOR NEXT SESSION: Continue with LE strengthening and  Pain management strategies.    Salem Caster. Fairly IV, PT, DPT Physical Therapist- [facility]  10/18/2022, 8:53 AM

## 2022-10-20 NOTE — Therapy (Signed)
OUTPATIENT PHYSICAL THERAPY THORACOLUMBAR/HIP TREATMENT   Patient Name: Yolanda Jimenez MRN: [id number] DOB:[birth year], 77 y.o., female Today's Date: 10/21/2022  END OF SESSION:  PT End of Session - 10/21/22 0844     Visit Number 7    Number of Visits 24    Date for PT Re-Evaluation 12/13/22    Progress Note Due on Visit 10    PT Start Time 0844    PT Stop Time 0920    PT Time Calculation (min) 36 min    Equipment Utilized During Treatment Gait belt    Activity Tolerance Patient tolerated treatment well;No increased pain    Behavior During Therapy WFL for tasks assessed/performed               Past Medical History:  Diagnosis Date   A-fib ([place])    Aortic atherosclerosis (HCC)    Arthritis    back   Asthma    AVM (arteriovenous malformation)    Barrett esophagus    C. difficile diarrhea    Carotid stenosis    Complication of anesthesia    very hard to come out of anesthesia and pt states she gets very" loopy" after surgeries   COPD (chronic obstructive pulmonary disease) (HCC)    no inhalers   Depression    Diabetes mellitus without complication (HCC)    borderline   Diverticulosis    GERD (gastroesophageal reflux disease)    Heart murmur    Hx of adenomatous colonic polyps    Hyperlipidemia    IBS (irritable bowel syndrome)    Insomnia    Iron deficiency anemia    Kidney cysts    hx UTIs   Macular degeneration    left eye   Microscopic hematuria    Osteoporosis    mild   PVD (peripheral vascular disease) ([place])    Wears dentures    partial lower   Past Surgical History:  Procedure Laterality Date   BUNIONECTOMY     right foot   CATARACT EXTRACTION     both eyes   CERVICAL CONE BIOPSY     CHOLECYSTECTOMY     COLONOSCOPY     COLONOSCOPY WITH PROPOFOL N/A 01/22/2019   Procedure: COLONOSCOPY WITH BIOPSY;  Surgeon: Lucilla Lame, MD;  Location: [place];  Service: Endoscopy;  Laterality: N/A;  diabetic - diet controlled   COLONOSCOPY WITH  PROPOFOL N/A 06/27/2020   Procedure: COLONOSCOPY WITH BIOPSY;  Surgeon: Lucilla Lame, MD;  Location: [place];  Service: Endoscopy;  Laterality: N/A;  priority 4   ESOPHAGOGASTRODUODENOSCOPY (EGD) WITH PROPOFOL N/A 08/29/2017   Procedure: ESOPHAGOGASTRODUODENOSCOPY (EGD) WITH PROPOFOL;  Surgeon: Lucilla Lame, MD;  Location: [place];  Service: Endoscopy;  Laterality: N/A;   FOOT SURGERY     right 2nd toe   HIP ARTHROPLASTY Left 05/16/2018   Procedure: ARTHROPLASTY BIPOLAR HIP (HEMIARTHROPLASTY);  Surgeon: Corky Mull, MD;  Location: [facility];  Service: Orthopedics;  Laterality: Left;   ORIF PERIPROSTHETIC FRACTURE Left 12/10/2021   Procedure: OPEN REDUCTION INTERNAL FIXATION OF A LEFT GREATER TROCHANTERIC FRACTURE;  Surgeon: Corky Mull, MD;  Location: [facility];  Service: Orthopedics;  Laterality: Left;   ORIF WRIST FRACTURE Right 10/07/2020   Procedure: OPEN REDUCTION INTERNAL FIXATION (ORIF) WRIST FRACTURE;  Surgeon: Hessie Knows, MD;  Location: [facility];  Service: Orthopedics;  Laterality: Right;   POLYPECTOMY N/A 01/22/2019   Procedure: POLYPECTOMY;  Surgeon: Lucilla Lame, MD;  Location: [place];  Service: Endoscopy;  Laterality:  N/A;  2 Clips placed at polyp removal site in ascending colon   POLYPECTOMY N/A 06/27/2020   Procedure: POLYPECTOMY;  Surgeon: Lucilla Lame, MD;  Location: [place];  Service: Endoscopy;  Laterality: N/A;   TUBAL LIGATION     UPPER GASTROINTESTINAL ENDOSCOPY     Patient Active Problem List   Diagnosis Date Noted   Trochanteric avulsion fracture of femur ([place]) 12/10/2021   Personal history of colonic polyps    Polyp of transverse colon    Major depressive disorder, recurrent, mild ([place]) 04/08/2020   Loss of weight    Polyp of ascending colon    Moderate protein-calorie malnutrition ([place]) 09/25/2018   Tubular adenoma 09/25/2018   Adult idiopathic generalized osteoporosis 08/03/2018   Aortic atherosclerosis ([place])  08/03/2018   Panlobular emphysema ([place]) 08/03/2018   Cellulitis of left hip 06/01/2018   Status post hip hemiarthroplasty 05/17/2018   Hip fracture ([place]) 05/15/2018   Tobacco abuse 04/04/2018   DM type 2 with diabetic mixed hyperlipidemia ([place]) 03/08/2018   Low serum vitamin D 09/09/2017   History of Barrett's esophagus    Esophagitis, unspecified    PAD (peripheral artery disease) ([place]) 10/15/2016   Carotid stenosis 10/15/2016   Pain in toe 09/17/2016   Renal cyst, acquired, left 09/13/2016   Medicare annual wellness visit, initial 09/03/2016   Type 2 diabetes mellitus with peripheral angiopathy ([place]) 08/29/2014   Combined fat and carbohydrate induced hyperlipemia 08/29/2014   Incomplete emptying of bladder 07/24/2014   Increased frequency of urination 03/19/2014   Lower abdominal pain 03/02/2014   Microscopic hematuria 03/01/2014   Nocturia 03/01/2014   Urinary tract infection, site not specified 03/01/2014   Diarrhea 03/05/2013   COLONIC POLYPS, ADENOMATOUS 09/05/2007   Hyperlipidemia 09/05/2007   IRON DEFICIENCY 09/05/2007   BARRETTS ESOPHAGUS 09/05/2007   DIVERTICULOSIS, COLON 09/05/2007   IRRITABLE BOWEL SYNDROME 09/05/2007   ARTERIOVENOUS MALFORMATION 09/05/2007    PCP: Dr. Emily Filbert  REFERRING PROVIDER: Marlene Lard  REFERRING DIAG:  [phone] (ICD-10-CM) - Spondylosis without myelopathy or radiculopathy, lumbar region  S72.112D (ICD-10-CM) - Displaced fracture of greater trochanter of left femur, subsequent encounter for closed fracture with routine healing  Z96.649 (ICD-10-CM) - Presence of unspecified artificial hip joint  M79.605 (ICD-10-CM) - Pain in left leg    Rationale for Evaluation and Treatment: Rehabilitation  THERAPY DIAG:  Difficulty in walking, not elsewhere classified  Abnormality of gait and mobility  Muscle weakness (generalized)  Pain in left leg  ONSET DATE: June 2023  SUBJECTIVE:  SUBJECTIVE STATEMENT: I am doing fine. No specific pain but my left LE does bother me with increased gait distances.    PERTINENT HISTORY:  Patient is a 77 year old female with referral for PT for lumbar spondylosis and radicular symptoms as well as left Hip surgeries (Left Hemiarthroplasty in 2019 from fall and ORIF in April 2023 from another fall). Patient denies any other falls since then and states she was doing well immediately after her last surgery but began to experience some increased Left LE pain from thigh down to calf- intermittent - rates at 5-6/10 with some very infrequent sharp left lateral hip pain. She reports pain as "like a headache in my leg" and states pain worsens with standing/walking.   PAIN:  Are you having pain? Yes: NPRS scale: 0/10 at rest and 5-6/10 pain going down left LE and very sharp infrequent left hip pain-10/10 Pain location: anteriolateral Left thigh down into calf Pain description: ache Aggravating factors: sleeping on left side, Walking Relieving factors: Rest, tylenol, pain relieving gel- [employer]  PRECAUTIONS: Fall  WEIGHT BEARING RESTRICTIONS: No  FALLS:  Has patient fallen in last 6 months? No and several falls in 2023  LIVING ENVIRONMENT: Lives with: lives alone Lives in: House/apartment Stairs: No Has following equipment at home: Single point cane, Environmental consultant - 2 wheeled, Wheelchair (manual), and Flat entry  OCCUPATION: Retired Tax adviser  PLOF: Independent  PATIENT GOALS: Stop the pain and be able to walk more  NEXT MD VISIT: Unsure  OBJECTIVE:   DIAGNOSTIC FINDINGS:  Narrative & Impression  CLINICAL DATA:  Fall and pain.   EXAM: DG HIP (WITH OR WITHOUT PELVIS) 2-3V LEFT   COMPARISON:  Left hip radiographs dating back to 12/06/2021.   FINDINGS: Left the prosthesis appears intact.  Status post prior fixation of fracture of greater trochanter of the left femur with fixation plate and cerclage wire. No new fracture. No dislocation. The bones are osteopenic. The soft tissues are unremarkable. Vascular calcifications noted multiple cutaneous staples noted over the left hip.   IMPRESSION: No acute fracture.     Electronically Signed   By: Anner Crete M.D.   On: 12/13/2021 00:31    PATIENT SURVEYS:  FOTO 52/predicted value = 59     SCREENING FOR RED FLAGS: Bowel or bladder incontinence: No Spinal tumors: No Cauda equina syndrome: No Compression fracture: No Abdominal aneurysm: No  COGNITION: Overall cognitive status: Within functional limits for tasks assessed     SENSATION: Light touch: Impaired   MUSCLE LENGTH: Hamstrings: Right 90 deg; Left 75 deg    POSTURE: No Significant postural limitations  PALPATION: No tenderness along left greater trochanter region today.   LUMBAR ROM:   AROM eval  Flexion Fingers to ankles  Extension WNL  Right lateral flexion Fingers to fibular head  Left lateral flexion Fingers to mid calf  Right rotation Painful yet WNL  Left rotation WNL   (Blank rows = not tested)   LUMBAR SPECIAL TESTS:  Straight leg raise test: Negative and FABER test: Negative  FUNCTIONAL TESTS:  6 minute walk test: 750 feet in 3 min 20 sec - Stopped due to 6/10 left LE radiuclar pain down to calf  GAIT: Distance walked: 750 Assistive device utilized:  None Level of assistance: SBA Comments: Initially slow reciprocal steps- mild unsteadiness yet after 1 min walking- observed significant increase in limp on left LE   Blood pressure- 144/76 mmHg Left UE sitting  TODAY'S TREATMENT:  DATE: 10/21/2022  Therex:   Nustep: LE only at L1 - for 2 min - VC to try to keep SPM > 50- Patient stopped- feeling  some left LE soreness.   Resistive gait- 12.5 matrix cable sys- 3 x forward (very fatiguing and required 1 UE support on 1st 2 trials then SBA on last trial)   Resistive stand Hip march RTB (at // bars)  2x10     R/L Lateral hurdle (x2) step overs  and 1 green therapad at // bars CGA: Down and back x 5  R/L forward hurdle (x2) step overs  and 1 green therapad at // bars CGA: Down and back x 5  Sit to stand without UE support 2 sets  x 12. Requires VC's for eccentric control and nose over toes- yet once she developed good technique- no difficulty.   Forward and lateral 6" step ups: x10/LE forwards, x10 lateral in each plane  Standing calf and toe raises: 2x12 each   PATIENT EDUCATION:  Education details: Purpose of PT, anatomy of lumbar/hip region, Def of radiuculopathy, Instruction in HEP Person educated: Patient Education method: Explanation, Demonstration, Tactile cues, Verbal cues, and Handouts Education comprehension: verbalized understanding, returned demonstration, verbal cues required, tactile cues required, and needs further education  HOME EXERCISE PROGRAM: Access Code: [email or web address] URL: [email or web address] Date: 09/23/2022 Prepared by: Sande Brothers  Exercises - Supine Gluteal Sets  - 1 x daily - 3 x weekly - 3 sets - 10 reps - Supine Hip Abduction  - 1 x daily - 3 x weekly - 3 sets - 10 reps - Supine Isometric Hip Adduction with Pillow at Knees  - 1 x daily - 3 x weekly - 3 sets - 10 reps - Quad Set  - 1 x daily - 3 x weekly - 3 sets - 10 reps - Supine Heel Slide  - 1 x daily - 3 x weekly - 3 sets - 10 reps     Access Code: [email or web address] URL: [email or web address] Date: 09/20/2022 Prepared by: Sande Brothers  Exercises - Sidelying Hip Flexor Stretch with Caregiver  - 1 x daily - 7 x weekly - 3 sets - 30 hold - Seated Hamstring Stretch  - 1 x daily - 7 x weekly - 3 sets - 30 hold  ASSESSMENT:  CLINICAL IMPRESSION: Treatment  continued to focus on LE strengthening and balance training- patient with some initial difficulty with increased left LE with minimal time on Nustep at lowest setting and later difficulty with strength and balance performing resistive gait- yet did improve with practice- but again soreness was limiting factor. Otherwise patient performed well with all other therex- able to vastly improve her STS with only VC today. Patient will continue to benefit from skilled PT services to improve her functional LE strength and mobility for optimal functional independence and decreased risk of falling.    OBJECTIVE IMPAIRMENTS: Abnormal gait, decreased activity tolerance, decreased balance, decreased endurance, decreased mobility, difficulty walking, decreased ROM, decreased strength, hypomobility, impaired perceived functional ability, impaired flexibility, and pain.   ACTIVITY LIMITATIONS: carrying, lifting, bending, standing, squatting, sleeping, stairs, transfers, and bed mobility  PARTICIPATION LIMITATIONS: cleaning, laundry, shopping, community activity, and yard work  PERSONAL FACTORS: Time since onset of injury/illness/exacerbation and 3+ comorbidities: arthritis, DM, previous surgeries to left hip  are also affecting patient's functional outcome.   REHAB POTENTIAL: Good  CLINICAL DECISION MAKING: Evolving/moderate complexity  EVALUATION COMPLEXITY: Moderate   GOALS: Goals reviewed with patient? Yes  SHORT TERM GOALS: Target date:  11/01/2022  Pt will be independent with HEP in order to improve strength and balance in order to decrease fall risk and improve function at home. Baseline: Eval- no formal HEP in place Goal status: INITIAL  2.  Pt will decrease worst back/LLE pain as reported on NPRS by at least 2 points in order to demonstrate clinically significant reduction in back pain.  Baseline: EVAL= 6/10 LLE pain Goal status: INITIAL   LONG TERM GOALS: Target date: 12/13/2022  Pt will  improve FOTO to target score of 59 to display perceived improvements in ability to complete ADL's.  Baseline: Eval = 52 Goal status: INITIAL  2.  Pt will increase 10MWT by at least 0.13 m/s in order to demonstrate clinically significant improvement in community ambulation.   Baseline: EVAL= 0.7 m/s Goal status: INITIAL  3.  Pt will increase 6MWT (able to complete the test and walk > 1200 feet)  in order to demonstrate clinically significant improvement in cardiopulmonary endurance and community ambulation  Baseline: EVAL= 750 feet in 3 min 20 sec Goal status: INITIAL  4.  Pt will decrease worst back/LLE pain as reported on NPRS by at least 3 points in order to demonstrate clinically significant reduction in back pain.  Baseline: EVAL= 6/10 with walking Left LE pain Goal status: INITIAL  5.  Patient will demonstrate improved ability to stand > 30 min without rest break for improved functional strength and independence with activities.  Baseline: EVAL= Patient unstable to stand > 6 min during eval- complaining of increased LLE pain with standing walking. Goal status: INITIAL   PLAN:  PT FREQUENCY: 1-2x/week  PT DURATION: 12 weeks  PLANNED INTERVENTIONS: Therapeutic exercises, Therapeutic activity, Neuromuscular re-education, Balance training, Gait training, Patient/Family education, Self Care, Joint mobilization, Joint manipulation, Stair training, Vestibular training, Canalith repositioning, DME instructions, Dry Needling, Electrical stimulation, Spinal manipulation, Spinal mobilization, Cryotherapy, Moist heat, Taping, and Manual therapy.  PLAN FOR NEXT SESSION: Continue with LE strengthening and  Pain management strategies.    Ollen Bowl, PT Physical Therapist- [facility]  10/21/2022, 9:29 AM

## 2022-10-21 ENCOUNTER — Ambulatory Visit: Payer: PPO

## 2022-10-21 ENCOUNTER — Other Ambulatory Visit (INDEPENDENT_AMBULATORY_CARE_PROVIDER_SITE_OTHER): Payer: Self-pay | Admitting: Vascular Surgery

## 2022-10-21 DIAGNOSIS — R262 Difficulty in walking, not elsewhere classified: Secondary | ICD-10-CM | POA: Diagnosis not present

## 2022-10-21 DIAGNOSIS — I6523 Occlusion and stenosis of bilateral carotid arteries: Secondary | ICD-10-CM

## 2022-10-21 DIAGNOSIS — M79605 Pain in left leg: Secondary | ICD-10-CM

## 2022-10-21 DIAGNOSIS — M6281 Muscle weakness (generalized): Secondary | ICD-10-CM

## 2022-10-21 DIAGNOSIS — R269 Unspecified abnormalities of gait and mobility: Secondary | ICD-10-CM

## 2022-10-21 DIAGNOSIS — I739 Peripheral vascular disease, unspecified: Secondary | ICD-10-CM

## 2022-10-22 ENCOUNTER — Encounter (INDEPENDENT_AMBULATORY_CARE_PROVIDER_SITE_OTHER): Payer: Self-pay | Admitting: Nurse Practitioner

## 2022-10-22 ENCOUNTER — Ambulatory Visit (INDEPENDENT_AMBULATORY_CARE_PROVIDER_SITE_OTHER): Payer: PPO

## 2022-10-22 ENCOUNTER — Ambulatory Visit (INDEPENDENT_AMBULATORY_CARE_PROVIDER_SITE_OTHER): Payer: PPO | Admitting: Nurse Practitioner

## 2022-10-22 VITALS — BP 137/80 | HR 65 | Resp 16 | Wt 101.2 lb

## 2022-10-22 DIAGNOSIS — I739 Peripheral vascular disease, unspecified: Secondary | ICD-10-CM | POA: Diagnosis not present

## 2022-10-22 DIAGNOSIS — E1151 Type 2 diabetes mellitus with diabetic peripheral angiopathy without gangrene: Secondary | ICD-10-CM

## 2022-10-22 DIAGNOSIS — E785 Hyperlipidemia, unspecified: Secondary | ICD-10-CM | POA: Diagnosis not present

## 2022-10-22 DIAGNOSIS — Z720 Tobacco use: Secondary | ICD-10-CM

## 2022-10-22 DIAGNOSIS — I6523 Occlusion and stenosis of bilateral carotid arteries: Secondary | ICD-10-CM

## 2022-10-22 NOTE — Progress Notes (Signed)
Subjective:    Patient ID: Yolanda Jimenez, female    DOB: 18-Dec-1945, 77 y.o.   MRN: [id number] No chief complaint on file.    Patient returns today in follow up of multiple vascular issues.    Physically, she has been fine.  Her feet are still cold and purplish, but she is not having a ton of pain.  No ulceration or infection.  She is working with physical therapy currently.  She continues to have no significant claudication-like symptoms.  She has ABI of 1.05 on the right and 0.71 on the left.  The left is reduced from the previous studies a year ago where her ABIs were 1.19 and 1.0.  Continues to have triphasic waveforms in the right lower extremity but with monophasic/biphasic in the left. She is also followed for her carotid disease.  No focal neurologic symptoms. Specifically, the patient denies amaurosis fugax, speech or swallowing difficulties, or arm or leg weakness or numbness. Carotid duplex today reveals stable 1 to 39% ICA stenosis bilaterally with minimal disease on each side       Review of Systems     Objective:   Physical Exam  There were no vitals taken for this visit.  Past Medical History:  Diagnosis Date   A-fib ([place])    Aortic atherosclerosis (HCC)    Arthritis    back   Asthma    AVM (arteriovenous malformation)    Barrett esophagus    C. difficile diarrhea    Carotid stenosis    Complication of anesthesia    very hard to come out of anesthesia and pt states she gets very" loopy" after surgeries   COPD (chronic obstructive pulmonary disease) (HCC)    no inhalers   Depression    Diabetes mellitus without complication (HCC)    borderline   Diverticulosis    GERD (gastroesophageal reflux disease)    Heart murmur    Hx of adenomatous colonic polyps    Hyperlipidemia    IBS (irritable bowel syndrome)    Insomnia    Iron deficiency anemia    Kidney cysts    hx UTIs   Macular degeneration    left eye   Microscopic hematuria    Osteoporosis     mild   PVD (peripheral vascular disease) (HCC)    Wears dentures    partial lower    Social History   Socioeconomic History   Marital status: Single    Spouse name: Not on file   Number of children: 2   Years of education: Not on file   Highest education level: Not on file  Occupational History   Occupation: Retired  Tobacco Use   Smoking status: Every Day    Packs/day: 0.50    Years: 55.00    Additional pack years: 0.00    Total pack years: 27.50    Types: Cigarettes   Smokeless tobacco: Never   Tobacco comments:    she has patches to start just not started yet. smoked since teenager.  Vaping Use   Vaping Use: Never used  Substance and Sexual Activity   Alcohol use: Yes    Alcohol/week: 2.0 standard drinks of alcohol    Types: 2 Glasses of wine per week    Comment: occ wine 1-2 weekly   Drug use: No   Sexual activity: Not on file  Other Topics Concern   Not on file  Social History Narrative   Independent at baseline.  Lives at home  with her husband   Social Determinants of Radio broadcast assistant Strain: Not on file  Food Insecurity: Not on file  Transportation Needs: Not on file  Physical Activity: Not on file  Stress: Not on file  Social Connections: Not on file  Intimate Partner Violence: Not on file    Past Surgical History:  Procedure Laterality Date   BUNIONECTOMY     right foot   CATARACT EXTRACTION     both eyes   CERVICAL CONE BIOPSY     CHOLECYSTECTOMY     COLONOSCOPY     COLONOSCOPY WITH PROPOFOL N/A 01/22/2019   Procedure: COLONOSCOPY WITH BIOPSY;  Surgeon: Lucilla Lame, MD;  Location: [facility];  Service: Endoscopy;  Laterality: N/A;  diabetic - diet controlled   COLONOSCOPY WITH PROPOFOL N/A 06/27/2020   Procedure: COLONOSCOPY WITH BIOPSY;  Surgeon: Lucilla Lame, MD;  Location: [place];  Service: Endoscopy;  Laterality: N/A;  priority 4   ESOPHAGOGASTRODUODENOSCOPY (EGD) WITH PROPOFOL N/A 08/29/2017   Procedure:  ESOPHAGOGASTRODUODENOSCOPY (EGD) WITH PROPOFOL;  Surgeon: Lucilla Lame, MD;  Location: [place];  Service: Endoscopy;  Laterality: N/A;   FOOT SURGERY     right 2nd toe   HIP ARTHROPLASTY Left 05/16/2018   Procedure: ARTHROPLASTY BIPOLAR HIP (HEMIARTHROPLASTY);  Surgeon: Corky Mull, MD;  Location: [facility];  Service: Orthopedics;  Laterality: Left;   ORIF PERIPROSTHETIC FRACTURE Left 12/10/2021   Procedure: OPEN REDUCTION INTERNAL FIXATION OF A LEFT GREATER TROCHANTERIC FRACTURE;  Surgeon: Corky Mull, MD;  Location: [facility];  Service: Orthopedics;  Laterality: Left;   ORIF WRIST FRACTURE Right 10/07/2020   Procedure: OPEN REDUCTION INTERNAL FIXATION (ORIF) WRIST FRACTURE;  Surgeon: Hessie Knows, MD;  Location: [facility];  Service: Orthopedics;  Laterality: Right;   POLYPECTOMY N/A 01/22/2019   Procedure: POLYPECTOMY;  Surgeon: Lucilla Lame, MD;  Location: [place];  Service: Endoscopy;  Laterality: N/A;  2 Clips placed at polyp removal site in ascending colon   POLYPECTOMY N/A 06/27/2020   Procedure: POLYPECTOMY;  Surgeon: Lucilla Lame, MD;  Location: [place];  Service: Endoscopy;  Laterality: N/A;   TUBAL LIGATION     UPPER GASTROINTESTINAL ENDOSCOPY      Family History  Problem Relation Age of Onset   Diabetes Mother    Heart disease Mother    Hypertension Mother    Diabetes Son    Diabetes Sister    Inflammatory bowel disease Father        diverticulitis   Multiple myeloma Father    Hypertension Son    Diabetes Maternal Grandmother    Colon cancer Neg Hx    Esophageal cancer Neg Hx    Rectal cancer Neg Hx    Stomach cancer Neg Hx    Breast cancer Neg Hx     Allergies  Allergen Reactions   Codeine     "felt drunk, crazy"   Hydrocodone Other (See Comments)   Hydrocodone-Acetaminophen Other (See Comments)    Confusion/delirium   Lidocaine     Heart racing, increased BP - from local at dentist (likely epi)   Nsaids Other (See Comments)     Avoids due to IBS symptoms (bleeding symptoms)   Oxycodone     Confusion/delirium       Latest Ref Rng & Units 12/12/2021   11:50 PM 12/12/2021    4:10 AM 12/11/2021    6:15 AM  CBC  WBC 4.0 - 10.5 K/uL 7.8  8.1  8.8  Hemoglobin 12.0 - 15.0 g/dL 9.2  9.2  9.8   Hematocrit 36.0 - 46.0 % 28.3  27.8  30.5   Platelets 150 - 400 K/uL 317  287  307       CMP     Component Value Date/Time   NA 136 12/12/2021 2350   NA 142 07/12/2014 2035   K 3.7 12/12/2021 2350   K 3.4 (L) 07/12/2014 2035   CL 104 12/12/2021 2350   CL 107 07/12/2014 2035   CO2 26 12/12/2021 2350   CO2 30 07/12/2014 2035   GLUCOSE 123 (H) 12/12/2021 2350   GLUCOSE 133 (H) 07/12/2014 2035   BUN 11 12/12/2021 2350   BUN 11 07/12/2014 2035   CREATININE 0.67 12/12/2021 2350   CREATININE 0.73 07/12/2014 2035   CALCIUM 8.3 (L) 12/12/2021 2350   CALCIUM 8.3 (L) 07/12/2014 2035   PROT 7.1 05/15/2018 1415   ALBUMIN 4.2 05/15/2018 1415   AST 25 05/15/2018 1415   ALT 16 05/15/2018 1415   ALKPHOS 62 05/15/2018 1415   BILITOT 0.6 05/15/2018 1415   GFRNONAA >60 12/12/2021 2350   GFRNONAA >60 07/12/2014 2035   GFRAA >60 05/18/2018 0515   GFRAA >60 07/12/2014 2035     No results found.     Assessment & Plan:   1. PAD (peripheral artery disease) ([place]) The patient has had a notable decrease in studies for her left lower extremity.  We will not plan on intervention at this time with the patient does not have any lifestyle limiting complications however we will have the patient follow-up in 6 months instead of 1 year to repeat noninvasive studies for reevaluation.  2. 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  Hgb A1C to be monitored as already arranged by primary service  3. Bilateral carotid artery stenosis Recommend:  Given the patient's asymptomatic subcritical  stenosis no further invasive testing or surgery at this time.  Duplex ultrasound shows 1-39% stenosis bilaterally.  Continue antiplatelet therapy as prescribed Continue management of CAD, HTN and Hyperlipidemia Healthy heart diet,  encouraged exercise at least 4 times per week Follow up in 12 months with duplex ultrasound and physical exam   4. Hyperlipidemia, unspecified hyperlipidemia type Continue statin as ordered and reviewed, no changes at this time  5. Tobacco abuse Smoking cessation was discussed, 3-10 minutes spent on this topic specifically   Current Outpatient Medications on File Prior to Visit  Medication Sig Dispense Refill   acetaminophen (TYLENOL) 500 MG tablet Take 1,000 mg by mouth every 6 (six) hours as needed.     Ascorbic Acid (VITAMIN C) 1000 MG tablet Take 1,000 mg by mouth in the morning.     B Complex-C-Folic Acid (STRESS B COMPLEX PO) Take 1 capsule by mouth in the morning.     Calcium Carb-Cholecalciferol (CALCIUM 600+D3 PO) Take 1 tablet by mouth in the morning.     celecoxib (CELEBREX) 200 MG capsule Take by mouth.     Cholecalciferol 50 MCG (2000 UT) CAPS Take 2,000 Units by mouth in the morning.     CRANBERRY-VITAMIN C PO Take 1 tablet by mouth 3 (three) times a week.     denosumab (PROLIA) 60 MG/ML SOSY injection Inject 60 mg into the skin every 6 (six) months.     diazepam (VALIUM) 5 MG tablet Take 1 tablet (5 mg total) by mouth daily as  needed for anxiety. (Patient taking differently: Take 5 mg by mouth 2 (two) times daily as needed for anxiety.) 10 tablet 0   diltiazem (CARDIZEM) 30 MG tablet Take 30 mg by mouth daily before breakfast.     ferrous gluconate (FERGON) 240 (27 FE) MG tablet Take 240 mg by mouth in the morning.     fluticasone (FLONASE) 50 MCG/ACT nasal spray Place 1 spray into both nostrils daily as needed for allergies or rhinitis.     ipratropium (ATROVENT) 0.03 % nasal spray SMARTSIG:1-2 Spray(s) Both Nares 3 Times Daily PRN      ipratropium (ATROVENT) 0.06 % nasal spray Place into the nose.     loperamide (IMODIUM) 2 MG capsule Take 2 mg by mouth every morning.     Misc Natural Products (BRAINSTRONG MEMORY SUPPORT PO) Take 2 capsules by mouth 3 (three) times a week. [place]     Multiple Vitamins-Minerals (PRESERVISION AREDS 2 PO) Take 2 tablets by mouth in the morning.     omeprazole (PRILOSEC) 40 MG capsule Take 40 mg by mouth daily before breakfast.     simvastatin (ZOCOR) 40 MG tablet Take 40 mg by mouth at bedtime.     traMADol (ULTRAM) 50 MG tablet Take 1-2 tablets (50-100 mg total) by mouth every 6 (six) hours as needed for moderate pain or severe pain. 30 tablet 0   venlafaxine XR (EFFEXOR-XR) 37.5 MG 24 hr capsule Take 37.5 mg by mouth daily.     No current facility-administered medications on file prior to visit.    There are no Patient Instructions on file for this visit. No follow-ups on file.   Kris Hartmann, NP

## 2022-10-24 NOTE — Therapy (Signed)
OUTPATIENT PHYSICAL THERAPY THORACOLUMBAR/HIP TREATMENT   Patient Name: Yolanda Jimenez MRN: [id number] DOB:[birth year], 77 y.o., female Today's Date: 10/25/2022  END OF SESSION:  PT End of Session - 10/25/22 0759     Visit Number 8    Number of Visits 24    Date for PT Re-Evaluation 12/13/22    Progress Note Due on Visit 10    PT Start Time 0800    PT Stop Time 0840    PT Time Calculation (min) 40 min    Equipment Utilized During Treatment Gait belt    Activity Tolerance Patient tolerated treatment well;No increased pain    Behavior During Therapy WFL for tasks assessed/performed               Past Medical History:  Diagnosis Date   A-fib ([place])    Aortic atherosclerosis (HCC)    Arthritis    back   Asthma    AVM (arteriovenous malformation)    Barrett esophagus    C. difficile diarrhea    Carotid stenosis    Complication of anesthesia    very hard to come out of anesthesia and pt states she gets very" loopy" after surgeries   COPD (chronic obstructive pulmonary disease) (HCC)    no inhalers   Depression    Diabetes mellitus without complication (HCC)    borderline   Diverticulosis    GERD (gastroesophageal reflux disease)    Heart murmur    Hx of adenomatous colonic polyps    Hyperlipidemia    IBS (irritable bowel syndrome)    Insomnia    Iron deficiency anemia    Kidney cysts    hx UTIs   Macular degeneration    left eye   Microscopic hematuria    Osteoporosis    mild   PVD (peripheral vascular disease) ([place])    Wears dentures    partial lower   Past Surgical History:  Procedure Laterality Date   BUNIONECTOMY     right foot   CATARACT EXTRACTION     both eyes   CERVICAL CONE BIOPSY     CHOLECYSTECTOMY     COLONOSCOPY     COLONOSCOPY WITH PROPOFOL N/A 01/22/2019   Procedure: COLONOSCOPY WITH BIOPSY;  Surgeon: Lucilla Lame, MD;  Location: [place];  Service: Endoscopy;  Laterality: N/A;  diabetic - diet controlled   COLONOSCOPY WITH  PROPOFOL N/A 06/27/2020   Procedure: COLONOSCOPY WITH BIOPSY;  Surgeon: Lucilla Lame, MD;  Location: [place];  Service: Endoscopy;  Laterality: N/A;  priority 4   ESOPHAGOGASTRODUODENOSCOPY (EGD) WITH PROPOFOL N/A 08/29/2017   Procedure: ESOPHAGOGASTRODUODENOSCOPY (EGD) WITH PROPOFOL;  Surgeon: Lucilla Lame, MD;  Location: [place];  Service: Endoscopy;  Laterality: N/A;   FOOT SURGERY     right 2nd toe   HIP ARTHROPLASTY Left 05/16/2018   Procedure: ARTHROPLASTY BIPOLAR HIP (HEMIARTHROPLASTY);  Surgeon: Corky Mull, MD;  Location: [facility];  Service: Orthopedics;  Laterality: Left;   ORIF PERIPROSTHETIC FRACTURE Left 12/10/2021   Procedure: OPEN REDUCTION INTERNAL FIXATION OF A LEFT GREATER TROCHANTERIC FRACTURE;  Surgeon: Corky Mull, MD;  Location: [facility];  Service: Orthopedics;  Laterality: Left;   ORIF WRIST FRACTURE Right 10/07/2020   Procedure: OPEN REDUCTION INTERNAL FIXATION (ORIF) WRIST FRACTURE;  Surgeon: Hessie Knows, MD;  Location: [facility];  Service: Orthopedics;  Laterality: Right;   POLYPECTOMY N/A 01/22/2019   Procedure: POLYPECTOMY;  Surgeon: Lucilla Lame, MD;  Location: [place];  Service: Endoscopy;  Laterality:  N/A;  2 Clips placed at polyp removal site in ascending colon   POLYPECTOMY N/A 06/27/2020   Procedure: POLYPECTOMY;  Surgeon: Lucilla Lame, MD;  Location: [place];  Service: Endoscopy;  Laterality: N/A;   TUBAL LIGATION     UPPER GASTROINTESTINAL ENDOSCOPY     Patient Active Problem List   Diagnosis Date Noted   Trochanteric avulsion fracture of femur ([place]) 12/10/2021   Personal history of colonic polyps    Polyp of transverse colon    Major depressive disorder, recurrent, mild ([place]) 04/08/2020   Loss of weight    Polyp of ascending colon    Moderate protein-calorie malnutrition ([place]) 09/25/2018   Tubular adenoma 09/25/2018   Adult idiopathic generalized osteoporosis 08/03/2018   Aortic atherosclerosis ([place])  08/03/2018   Panlobular emphysema ([place]) 08/03/2018   Cellulitis of left hip 06/01/2018   Status post hip hemiarthroplasty 05/17/2018   Hip fracture ([place]) 05/15/2018   Tobacco abuse 04/04/2018   DM type 2 with diabetic mixed hyperlipidemia ([place]) 03/08/2018   Low serum vitamin D 09/09/2017   History of Barrett's esophagus    Esophagitis, unspecified    PAD (peripheral artery disease) ([place]) 10/15/2016   Carotid stenosis 10/15/2016   Pain in toe 09/17/2016   Renal cyst, acquired, left 09/13/2016   Medicare annual wellness visit, initial 09/03/2016   Type 2 diabetes mellitus with peripheral angiopathy ([place]) 08/29/2014   Combined fat and carbohydrate induced hyperlipemia 08/29/2014   Incomplete emptying of bladder 07/24/2014   Increased frequency of urination 03/19/2014   Lower abdominal pain 03/02/2014   Microscopic hematuria 03/01/2014   Nocturia 03/01/2014   Urinary tract infection, site not specified 03/01/2014   Diarrhea 03/05/2013   COLONIC POLYPS, ADENOMATOUS 09/05/2007   Hyperlipidemia 09/05/2007   IRON DEFICIENCY 09/05/2007   BARRETTS ESOPHAGUS 09/05/2007   DIVERTICULOSIS, COLON 09/05/2007   IRRITABLE BOWEL SYNDROME 09/05/2007   ARTERIOVENOUS MALFORMATION 09/05/2007    PCP: Dr. Emily Filbert  REFERRING PROVIDER: Marlene Lard  REFERRING DIAG:  [phone] (ICD-10-CM) - Spondylosis without myelopathy or radiculopathy, lumbar region  S72.112D (ICD-10-CM) - Displaced fracture of greater trochanter of left femur, subsequent encounter for closed fracture with routine healing  Z96.649 (ICD-10-CM) - Presence of unspecified artificial hip joint  M79.605 (ICD-10-CM) - Pain in left leg    Rationale for Evaluation and Treatment: Rehabilitation  THERAPY DIAG:  Difficulty in walking, not elsewhere classified  Abnormality of gait and mobility  Muscle weakness (generalized)  Pain in left leg  ONSET DATE: June 2023  SUBJECTIVE:  SUBJECTIVE STATEMENT: Patient reports she did not sleep well. States continued pain with crossing left LE over right and prolonged walking.   PERTINENT HISTORY:  Patient is a 77 year old female with referral for PT for lumbar spondylosis and radicular symptoms as well as left Hip surgeries (Left Hemiarthroplasty in 2019 from fall and ORIF in April 2023 from another fall). Patient denies any other falls since then and states she was doing well immediately after her last surgery but began to experience some increased Left LE pain from thigh down to calf- intermittent - rates at 5-6/10 with some very infrequent sharp left lateral hip pain. She reports pain as "like a headache in my leg" and states pain worsens with standing/walking.   PAIN:  Are you having pain? Yes: NPRS scale: 0/10 at rest and 5-6/10 pain going down left LE and very sharp infrequent left hip pain-10/10 Pain location: anteriolateral Left thigh down into calf Pain description: ache Aggravating factors: sleeping on left side, Walking Relieving factors: Rest, tylenol, pain relieving gel- [employer]  PRECAUTIONS: Fall  WEIGHT BEARING RESTRICTIONS: No  FALLS:  Has patient fallen in last 6 months? No and several falls in 2023  LIVING ENVIRONMENT: Lives with: lives alone Lives in: House/apartment Stairs: No Has following equipment at home: Single point cane, Environmental consultant - 2 wheeled, Wheelchair (manual), and Flat entry  OCCUPATION: Retired Tax adviser  PLOF: Independent  PATIENT GOALS: Stop the pain and be able to walk more  NEXT MD VISIT: Unsure  OBJECTIVE:   DIAGNOSTIC FINDINGS:  Narrative & Impression  CLINICAL DATA:  Fall and pain.   EXAM: DG HIP (WITH OR WITHOUT PELVIS) 2-3V LEFT   COMPARISON:  Left hip radiographs dating back to 12/06/2021.   FINDINGS: Left the  prosthesis appears intact. Status post prior fixation of fracture of greater trochanter of the left femur with fixation plate and cerclage wire. No new fracture. No dislocation. The bones are osteopenic. The soft tissues are unremarkable. Vascular calcifications noted multiple cutaneous staples noted over the left hip.   IMPRESSION: No acute fracture.     Electronically Signed   By: Anner Crete M.D.   On: 12/13/2021 00:31    PATIENT SURVEYS:  FOTO 52/predicted value = 59     SCREENING FOR RED FLAGS: Bowel or bladder incontinence: No Spinal tumors: No Cauda equina syndrome: No Compression fracture: No Abdominal aneurysm: No  COGNITION: Overall cognitive status: Within functional limits for tasks assessed     SENSATION: Light touch: Impaired   MUSCLE LENGTH: Hamstrings: Right 90 deg; Left 75 deg    POSTURE: No Significant postural limitations  PALPATION: No tenderness along left greater trochanter region today.   LUMBAR ROM:   AROM eval  Flexion Fingers to ankles  Extension WNL  Right lateral flexion Fingers to fibular head  Left lateral flexion Fingers to mid calf  Right rotation Painful yet WNL  Left rotation WNL   (Blank rows = not tested)   LUMBAR SPECIAL TESTS:  Straight leg raise test: Negative and FABER test: Negative  FUNCTIONAL TESTS:  6 minute walk test: 750 feet in 3 min 20 sec - Stopped due to 6/10 left LE radiuclar pain down to calf  GAIT: Distance walked: 750 Assistive device utilized:  None Level of assistance: SBA Comments: Initially slow reciprocal steps- mild unsteadiness yet after 1 min walking- observed significant increase in limp on left LE   Blood pressure- 144/76 mmHg Left UE sitting  TODAY'S TREATMENT:  DATE: 10/25/2022  Therex:   Seated LE Strengthening: - march 3# AW x 12 reps each -LAQ  3# AW x 12 reps each - Straight leg raise up/over cone- x 10 reps each LE (patient reports slight increase pain in left LE)   Standing Lateral step up and down on 6" step block x 12 reps each way.  Standing forward lunges onto bosu with 1 UE support with 3# x 12reps each.   Dynamic standing on BOSU without UE support - 1 min x 2 trials-   Resistive walking with 3# AW- 2 set of 10 reps    Golfers lean- 12 reps without UE support- and unable to perform on left LE without UE support     PATIENT EDUCATION:  Education details: Purpose of PT, anatomy of lumbar/hip region, Def of radiuculopathy, Instruction in HEP Person educated: Patient Education method: Explanation, Demonstration, Tactile cues, Verbal cues, and Handouts Education comprehension: verbalized understanding, returned demonstration, verbal cues required, tactile cues required, and needs further education  HOME EXERCISE PROGRAM: Access Code: [email or web address] URL: [email or web address] Date: 09/23/2022 Prepared by: Sande Brothers  Exercises - Supine Gluteal Sets  - 1 x daily - 3 x weekly - 3 sets - 10 reps - Supine Hip Abduction  - 1 x daily - 3 x weekly - 3 sets - 10 reps - Supine Isometric Hip Adduction with Pillow at Knees  - 1 x daily - 3 x weekly - 3 sets - 10 reps - Quad Set  - 1 x daily - 3 x weekly - 3 sets - 10 reps - Supine Heel Slide  - 1 x daily - 3 x weekly - 3 sets - 10 reps     Access Code: [email or web address] URL: [email or web address] Date: 09/20/2022 Prepared by: Sande Brothers  Exercises - Sidelying Hip Flexor Stretch with Caregiver  - 1 x daily - 7 x weekly - 3 sets - 30 hold - Seated Hamstring Stretch  - 1 x daily - 7 x weekly - 3 sets - 30 hold  ASSESSMENT:  CLINICAL IMPRESSION: Patient able to demo progress with increased resistance with all therex today. She was limited by mostly fatigue yet some LE weakness. Discussed not crossing LE to see if some of her pain may be  circulatory.  Patient with difficulty with single leg stance on left LE and will benefit from further training. Patient will continue to benefit from skilled PT services to improve her functional LE strength and mobility for optimal functional independence and decreased risk of falling.    OBJECTIVE IMPAIRMENTS: Abnormal gait, decreased activity tolerance, decreased balance, decreased endurance, decreased mobility, difficulty walking, decreased ROM, decreased strength, hypomobility, impaired perceived functional ability, impaired flexibility, and pain.   ACTIVITY LIMITATIONS: carrying, lifting, bending, standing, squatting, sleeping, stairs, transfers, and bed mobility  PARTICIPATION LIMITATIONS: cleaning, laundry, shopping, community activity, and yard work  PERSONAL FACTORS: Time since onset of injury/illness/exacerbation and 3+ comorbidities: arthritis, DM, previous surgeries to left hip  are also affecting patient's functional outcome.   REHAB POTENTIAL: Good  CLINICAL DECISION MAKING: Evolving/moderate complexity  EVALUATION COMPLEXITY: Moderate   GOALS: Goals reviewed with patient? Yes  SHORT TERM GOALS: Target date: 11/01/2022  Pt will be independent with HEP in order to improve strength and balance in order to decrease fall risk and improve function at home. Baseline: Eval- no formal HEP in place Goal status: INITIAL  2.  Pt will decrease worst back/LLE pain as reported on NPRS by at least 2 points in order  to demonstrate clinically significant reduction in back pain.  Baseline: EVAL= 6/10 LLE pain Goal status: INITIAL   LONG TERM GOALS: Target date: 12/13/2022  Pt will improve FOTO to target score of 59 to display perceived improvements in ability to complete ADL's.  Baseline: Eval = 52 Goal status: INITIAL  2.  Pt will increase 10MWT by at least 0.13 m/s in order to demonstrate clinically significant improvement in community ambulation.   Baseline: EVAL= 0.7 m/s Goal  status: INITIAL  3.  Pt will increase 6MWT (able to complete the test and walk > 1200 feet)  in order to demonstrate clinically significant improvement in cardiopulmonary endurance and community ambulation  Baseline: EVAL= 750 feet in 3 min 20 sec Goal status: INITIAL  4.  Pt will decrease worst back/LLE pain as reported on NPRS by at least 3 points in order to demonstrate clinically significant reduction in back pain.  Baseline: EVAL= 6/10 with walking Left LE pain Goal status: INITIAL  5.  Patient will demonstrate improved ability to stand > 30 min without rest break for improved functional strength and independence with activities.  Baseline: EVAL= Patient unstable to stand > 6 min during eval- complaining of increased LLE pain with standing walking. Goal status: INITIAL   PLAN:  PT FREQUENCY: 1-2x/week  PT DURATION: 12 weeks  PLANNED INTERVENTIONS: Therapeutic exercises, Therapeutic activity, Neuromuscular re-education, Balance training, Gait training, Patient/Family education, Self Care, Joint mobilization, Joint manipulation, Stair training, Vestibular training, Canalith repositioning, DME instructions, Dry Needling, Electrical stimulation, Spinal manipulation, Spinal mobilization, Cryotherapy, Moist heat, Taping, and Manual therapy.  PLAN FOR NEXT SESSION: Continue with LE strengthening and  Pain management strategies.    Ollen Bowl, PT Physical Therapist- [facility]  10/25/2022, 8:52 AM

## 2022-10-25 ENCOUNTER — Ambulatory Visit: Payer: PPO

## 2022-10-25 DIAGNOSIS — M6281 Muscle weakness (generalized): Secondary | ICD-10-CM

## 2022-10-25 DIAGNOSIS — R262 Difficulty in walking, not elsewhere classified: Secondary | ICD-10-CM

## 2022-10-25 DIAGNOSIS — R269 Unspecified abnormalities of gait and mobility: Secondary | ICD-10-CM

## 2022-10-25 DIAGNOSIS — M79605 Pain in left leg: Secondary | ICD-10-CM

## 2022-10-25 LAB — VAS US ABI WITH/WO TBI
Left ABI: 0.71
Right ABI: 1.05

## 2022-10-27 ENCOUNTER — Ambulatory Visit: Payer: Medicare PPO | Admitting: Urology

## 2022-10-29 DIAGNOSIS — M9973 Connective tissue and disc stenosis of intervertebral foramina of lumbar region: Secondary | ICD-10-CM | POA: Diagnosis not present

## 2022-10-29 DIAGNOSIS — M7062 Trochanteric bursitis, left hip: Secondary | ICD-10-CM | POA: Diagnosis not present

## 2022-10-29 DIAGNOSIS — S72112D Displaced fracture of greater trochanter of left femur, subsequent encounter for closed fracture with routine healing: Secondary | ICD-10-CM | POA: Diagnosis not present

## 2022-10-29 DIAGNOSIS — M47816 Spondylosis without myelopathy or radiculopathy, lumbar region: Secondary | ICD-10-CM | POA: Diagnosis not present

## 2022-11-01 ENCOUNTER — Ambulatory Visit: Payer: PPO

## 2022-11-01 DIAGNOSIS — R262 Difficulty in walking, not elsewhere classified: Secondary | ICD-10-CM | POA: Diagnosis not present

## 2022-11-01 DIAGNOSIS — M79605 Pain in left leg: Secondary | ICD-10-CM

## 2022-11-01 DIAGNOSIS — R269 Unspecified abnormalities of gait and mobility: Secondary | ICD-10-CM

## 2022-11-01 DIAGNOSIS — M6281 Muscle weakness (generalized): Secondary | ICD-10-CM

## 2022-11-01 NOTE — Therapy (Signed)
OUTPATIENT PHYSICAL THERAPY THORACOLUMBAR/HIP TREATMENT   Patient Name: Yolanda Jimenez MRN: [id number] DOB:05-20-1946, 77 y.o., female Today's Date: 11/01/2022  END OF SESSION:  PT End of Session - 11/01/22 0806     Visit Number 9    Number of Visits 24    Date for PT Re-Evaluation 12/13/22    Progress Note Due on Visit 10    PT Start Time 0800    PT Stop Time 0833    PT Time Calculation (min) 33 min    Equipment Utilized During Treatment Gait belt    Activity Tolerance Patient tolerated treatment well;No increased pain    Behavior During Therapy WFL for tasks assessed/performed               Past Medical History:  Diagnosis Date   A-fib ([place])    Aortic atherosclerosis (HCC)    Arthritis    back   Asthma    AVM (arteriovenous malformation)    Barrett esophagus    C. difficile diarrhea    Carotid stenosis    Complication of anesthesia    very hard to come out of anesthesia and pt states she gets very" loopy" after surgeries   COPD (chronic obstructive pulmonary disease) (HCC)    no inhalers   Depression    Diabetes mellitus without complication (HCC)    borderline   Diverticulosis    GERD (gastroesophageal reflux disease)    Heart murmur    Hx of adenomatous colonic polyps    Hyperlipidemia    IBS (irritable bowel syndrome)    Insomnia    Iron deficiency anemia    Kidney cysts    hx UTIs   Macular degeneration    left eye   Microscopic hematuria    Osteoporosis    mild   PVD (peripheral vascular disease) ([place])    Wears dentures    partial lower   Past Surgical History:  Procedure Laterality Date   BUNIONECTOMY     right foot   CATARACT EXTRACTION     both eyes   CERVICAL CONE BIOPSY     CHOLECYSTECTOMY     COLONOSCOPY     COLONOSCOPY WITH PROPOFOL N/A 01/22/2019   Procedure: COLONOSCOPY WITH BIOPSY;  Surgeon: Lucilla Lame, MD;  Location: [facility];  Service: Endoscopy;  Laterality: N/A;  diabetic - diet controlled   COLONOSCOPY WITH  PROPOFOL N/A 06/27/2020   Procedure: COLONOSCOPY WITH BIOPSY;  Surgeon: Lucilla Lame, MD;  Location: [facility];  Service: Endoscopy;  Laterality: N/A;  priority 4   ESOPHAGOGASTRODUODENOSCOPY (EGD) WITH PROPOFOL N/A 08/29/2017   Procedure: ESOPHAGOGASTRODUODENOSCOPY (EGD) WITH PROPOFOL;  Surgeon: Lucilla Lame, MD;  Location: [place];  Service: Endoscopy;  Laterality: N/A;   FOOT SURGERY     right 2nd toe   HIP ARTHROPLASTY Left 05/16/2018   Procedure: ARTHROPLASTY BIPOLAR HIP (HEMIARTHROPLASTY);  Surgeon: Corky Mull, MD;  Location: [facility];  Service: Orthopedics;  Laterality: Left;   ORIF PERIPROSTHETIC FRACTURE Left 12/10/2021   Procedure: OPEN REDUCTION INTERNAL FIXATION OF A LEFT GREATER TROCHANTERIC FRACTURE;  Surgeon: Corky Mull, MD;  Location: [facility];  Service: Orthopedics;  Laterality: Left;   ORIF WRIST FRACTURE Right 10/07/2020   Procedure: OPEN REDUCTION INTERNAL FIXATION (ORIF) WRIST FRACTURE;  Surgeon: Hessie Knows, MD;  Location: [facility];  Service: Orthopedics;  Laterality: Right;   POLYPECTOMY N/A 01/22/2019   Procedure: POLYPECTOMY;  Surgeon: Lucilla Lame, MD;  Location: [place];  Service: Endoscopy;  Laterality:  N/A;  2 Clips placed at polyp removal site in ascending colon   POLYPECTOMY N/A 06/27/2020   Procedure: POLYPECTOMY;  Surgeon: Lucilla Lame, MD;  Location: [place];  Service: Endoscopy;  Laterality: N/A;   TUBAL LIGATION     UPPER GASTROINTESTINAL ENDOSCOPY     Patient Active Problem List   Diagnosis Date Noted   Trochanteric avulsion fracture of femur ([place]) 12/10/2021   Personal history of colonic polyps    Polyp of transverse colon    Major depressive disorder, recurrent, mild ([place]) 04/08/2020   Loss of weight    Polyp of ascending colon    Moderate protein-calorie malnutrition ([place]) 09/25/2018   Tubular adenoma 09/25/2018   Adult idiopathic generalized osteoporosis 08/03/2018   Aortic atherosclerosis ([place])  08/03/2018   Panlobular emphysema ([place]) 08/03/2018   Cellulitis of left hip 06/01/2018   Status post hip hemiarthroplasty 05/17/2018   Hip fracture ([place]) 05/15/2018   Tobacco abuse 04/04/2018   DM type 2 with diabetic mixed hyperlipidemia ([place]) 03/08/2018   Low serum vitamin D 09/09/2017   History of Barrett's esophagus    Esophagitis, unspecified    PAD (peripheral artery disease) ([place]) 10/15/2016   Carotid stenosis 10/15/2016   Pain in toe 09/17/2016   Renal cyst, acquired, left 09/13/2016   Medicare annual wellness visit, initial 09/03/2016   Type 2 diabetes mellitus with peripheral angiopathy ([place]) 08/29/2014   Combined fat and carbohydrate induced hyperlipemia 08/29/2014   Incomplete emptying of bladder 07/24/2014   Increased frequency of urination 03/19/2014   Lower abdominal pain 03/02/2014   Microscopic hematuria 03/01/2014   Nocturia 03/01/2014   Urinary tract infection, site not specified 03/01/2014   Diarrhea 03/05/2013   COLONIC POLYPS, ADENOMATOUS 09/05/2007   Hyperlipidemia 09/05/2007   IRON DEFICIENCY 09/05/2007   BARRETTS ESOPHAGUS 09/05/2007   DIVERTICULOSIS, COLON 09/05/2007   IRRITABLE BOWEL SYNDROME 09/05/2007   ARTERIOVENOUS MALFORMATION 09/05/2007    PCP: Dr. Emily Filbert  REFERRING PROVIDER: Marlene Lard  REFERRING DIAG:  [phone] (ICD-10-CM) - Spondylosis without myelopathy or radiculopathy, lumbar region  S72.112D (ICD-10-CM) - Displaced fracture of greater trochanter of left femur, subsequent encounter for closed fracture with routine healing  Z96.649 (ICD-10-CM) - Presence of unspecified artificial hip joint  M79.605 (ICD-10-CM) - Pain in left leg    Rationale for Evaluation and Treatment: Rehabilitation  THERAPY DIAG:  Difficulty in walking, not elsewhere classified  Abnormality of gait and mobility  Muscle weakness (generalized)  Pain in left leg  ONSET DATE: June 2023  SUBJECTIVE:  SUBJECTIVE STATEMENT: Patient reports doing okay this morning- Reports ongoing left hip pain. States went to see Dr. Roland Rack   PERTINENT HISTORY:  Patient is a 77 year old female with referral for PT for lumbar spondylosis and radicular symptoms as well as left Hip surgeries (Left Hemiarthroplasty in 2019 from fall and ORIF in April 2023 from another fall). Patient denies any other falls since then and states she was doing well immediately after her last surgery but began to experience some increased Left LE pain from thigh down to calf- intermittent - rates at 5-6/10 with some very infrequent sharp left lateral hip pain. She reports pain as "like a headache in my leg" and states pain worsens with standing/walking.   PAIN:  Are you having pain? Yes: NPRS scale: 0/10 at rest and 5-6/10 pain going down left LE and very sharp infrequent left hip pain-10/10 Pain location: anteriolateral Left thigh down into calf Pain description: ache Aggravating factors: sleeping on left side, Walking Relieving factors: Rest, tylenol, pain relieving gel- [employer]  PRECAUTIONS: Fall  WEIGHT BEARING RESTRICTIONS: No  FALLS:  Has patient fallen in last 6 months? No and several falls in 2023  LIVING ENVIRONMENT: Lives with: lives alone Lives in: House/apartment Stairs: No Has following equipment at home: Single point cane, Environmental consultant - 2 wheeled, Wheelchair (manual), and Flat entry  OCCUPATION: Retired Tax adviser  PLOF: Independent  PATIENT GOALS: Stop the pain and be able to walk more  NEXT MD VISIT: Unsure  OBJECTIVE:   DIAGNOSTIC FINDINGS:  Narrative & Impression  CLINICAL DATA:  Fall and pain.   EXAM: DG HIP (WITH OR WITHOUT PELVIS) 2-3V LEFT   COMPARISON:  Left hip radiographs dating back to 12/06/2021.   FINDINGS: Left the prosthesis appears  intact. Status post prior fixation of fracture of greater trochanter of the left femur with fixation plate and cerclage wire. No new fracture. No dislocation. The bones are osteopenic. The soft tissues are unremarkable. Vascular calcifications noted multiple cutaneous staples noted over the left hip.   IMPRESSION: No acute fracture.     Electronically Signed   By: Anner Crete M.D.   On: 12/13/2021 00:31    PATIENT SURVEYS:  FOTO 52/predicted value = 59     SCREENING FOR RED FLAGS: Bowel or bladder incontinence: No Spinal tumors: No Cauda equina syndrome: No Compression fracture: No Abdominal aneurysm: No  COGNITION: Overall cognitive status: Within functional limits for tasks assessed     SENSATION: Light touch: Impaired   MUSCLE LENGTH: Hamstrings: Right 90 deg; Left 75 deg    POSTURE: No Significant postural limitations  PALPATION: No tenderness along left greater trochanter region today.   LUMBAR ROM:   AROM eval  Flexion Fingers to ankles  Extension WNL  Right lateral flexion Fingers to fibular head  Left lateral flexion Fingers to mid calf  Right rotation Painful yet WNL  Left rotation WNL   (Blank rows = not tested)   LUMBAR SPECIAL TESTS:  Straight leg raise test: Negative and FABER test: Negative  FUNCTIONAL TESTS:  6 minute walk test: 750 feet in 3 min 20 sec - Stopped due to 6/10 left LE radiuclar pain down to calf  GAIT: Distance walked: 750 Assistive device utilized:  None Level of assistance: SBA Comments: Initially slow reciprocal steps- mild unsteadiness yet after 1 min walking- observed significant increase in limp on left LE     TODAY'S TREATMENT:  DATE: 11/01/2022  Per note from Dr. Roland Rack on 3/22: Please emphasize IT band stretching (roller exercises, etc.) to help reduce left greater troch  symptoms. Also, consider ultrasound, etc. to reduce left greater troch symptoms.     Manual Therapy:   Rolling sick to Left posterior lateral hip region- TFL/Gluteal met/glutes/IT band and left lateral hamstring region x 10 min STM to left IT band- gentle cross friction massage  Modalities:  Ultrasound: 1.5 cm squared with 3 MHzx 8 minutes to right lateral hip (TFL down toward greater trochanter region/IT band/posterior upp glutes)    PATIENT EDUCATION:  Education details: Purpose of PT, anatomy of lumbar/hip region, Def of radiuculopathy, Instruction in HEP Person educated: Patient Education method: Explanation, Demonstration, Tactile cues, Verbal cues, and Handouts Education comprehension: verbalized understanding, returned demonstration, verbal cues required, tactile cues required, and needs further education  HOME EXERCISE PROGRAM: Access Code: [email or web address] URL: [email or web address] Date: 09/23/2022 Prepared by: Sande Brothers  Exercises - Supine Gluteal Sets  - 1 x daily - 3 x weekly - 3 sets - 10 reps - Supine Hip Abduction  - 1 x daily - 3 x weekly - 3 sets - 10 reps - Supine Isometric Hip Adduction with Pillow at Knees  - 1 x daily - 3 x weekly - 3 sets - 10 reps - Quad Set  - 1 x daily - 3 x weekly - 3 sets - 10 reps - Supine Heel Slide  - 1 x daily - 3 x weekly - 3 sets - 10 reps     Access Code: [email or web address] URL: [email or web address] Date: 09/20/2022 Prepared by: Sande Brothers  Exercises - Sidelying Hip Flexor Stretch with Caregiver  - 1 x daily - 7 x weekly - 3 sets - 30 hold - Seated Hamstring Stretch  - 1 x daily - 7 x weekly - 3 sets - 30 hold  ASSESSMENT:  CLINICAL IMPRESSION:  Treatment consisted of following new request per Dr. Roland Rack- to focus on left hip pain- diagnosed with trochanteric Bursitis and request from MD to emphasize IT band rollling and did begin ultrasound per  MD request) Patient was tender to touch so kept  rolling very gentle and prescribed her to practice rolling on her own at home. She reports not feeling any bumps or popping after session. Limited to no exercises to see if patient would benefit from stretching/rolling and modailties. Will plan to incorporate more stretching ROM and resume some weight bearing activities next session.  Patient will continue to benefit from skilled PT services to improve her functional LE strength and mobility for optimal functional independence and decreased risk of falling.    OBJECTIVE IMPAIRMENTS: Abnormal gait, decreased activity tolerance, decreased balance, decreased endurance, decreased mobility, difficulty walking, decreased ROM, decreased strength, hypomobility, impaired perceived functional ability, impaired flexibility, and pain.   ACTIVITY LIMITATIONS: carrying, lifting, bending, standing, squatting, sleeping, stairs, transfers, and bed mobility  PARTICIPATION LIMITATIONS: cleaning, laundry, shopping, community activity, and yard work  PERSONAL FACTORS: Time since onset of injury/illness/exacerbation and 3+ comorbidities: arthritis, DM, previous surgeries to left hip  are also affecting patient's functional outcome.   REHAB POTENTIAL: Good  CLINICAL DECISION MAKING: Evolving/moderate complexity  EVALUATION COMPLEXITY: Moderate   GOALS: Goals reviewed with patient? Yes  SHORT TERM GOALS: Target date: 11/01/2022  Pt will be independent with HEP in order to improve strength and balance in order to decrease fall risk and improve function at home. Baseline: Eval- no formal HEP in place Goal status: INITIAL  2.  Pt will decrease worst back/LLE pain as reported on NPRS by at least 2 points in order to demonstrate clinically significant reduction in back pain.  Baseline: EVAL= 6/10 LLE pain Goal status: INITIAL   LONG TERM GOALS: Target date: 12/13/2022  Pt will improve FOTO to target score of 59 to display perceived improvements in ability to  complete ADL's.  Baseline: Eval = 52 Goal status: INITIAL  2.  Pt will increase 10MWT by at least 0.13 m/s in order to demonstrate clinically significant improvement in community ambulation.   Baseline: EVAL= 0.7 m/s Goal status: INITIAL  3.  Pt will increase 6MWT (able to complete the test and walk > 1200 feet)  in order to demonstrate clinically significant improvement in cardiopulmonary endurance and community ambulation  Baseline: EVAL= 750 feet in 3 min 20 sec Goal status: INITIAL  4.  Pt will decrease worst back/LLE pain as reported on NPRS by at least 3 points in order to demonstrate clinically significant reduction in back pain.  Baseline: EVAL= 6/10 with walking Left LE pain Goal status: INITIAL  5.  Patient will demonstrate improved ability to stand > 30 min without rest break for improved functional strength and independence with activities.  Baseline: EVAL= Patient unstable to stand > 6 min during eval- complaining of increased LLE pain with standing walking. Goal status: INITIAL   PLAN:  PT FREQUENCY: 1-2x/week  PT DURATION: 12 weeks  PLANNED INTERVENTIONS: Therapeutic exercises, Therapeutic activity, Neuromuscular re-education, Balance training, Gait training, Patient/Family education, Self Care, Joint mobilization, Joint manipulation, Stair training, Vestibular training, Canalith repositioning, DME instructions, Dry Needling, Electrical stimulation, Spinal manipulation, Spinal mobilization, Cryotherapy, Moist heat, Taping, and Manual therapy. Ultrasound per MD request on 10/29/2022 office note  PLAN FOR NEXT SESSION: Continue with LE strengthening and  Pain management strategies including IT rolling/ultrasound   Ollen Bowl, PT Physical Therapist- [facility]  11/01/2022, 10:56 AM

## 2022-11-02 DIAGNOSIS — E1151 Type 2 diabetes mellitus with diabetic peripheral angiopathy without gangrene: Secondary | ICD-10-CM | POA: Diagnosis not present

## 2022-11-02 DIAGNOSIS — M818 Other osteoporosis without current pathological fracture: Secondary | ICD-10-CM | POA: Diagnosis not present

## 2022-11-02 DIAGNOSIS — E1165 Type 2 diabetes mellitus with hyperglycemia: Secondary | ICD-10-CM | POA: Diagnosis not present

## 2022-11-04 ENCOUNTER — Ambulatory Visit: Payer: PPO

## 2022-11-04 DIAGNOSIS — R269 Unspecified abnormalities of gait and mobility: Secondary | ICD-10-CM

## 2022-11-04 DIAGNOSIS — M6281 Muscle weakness (generalized): Secondary | ICD-10-CM

## 2022-11-04 DIAGNOSIS — R262 Difficulty in walking, not elsewhere classified: Secondary | ICD-10-CM | POA: Diagnosis not present

## 2022-11-04 DIAGNOSIS — M79605 Pain in left leg: Secondary | ICD-10-CM

## 2022-11-04 NOTE — Therapy (Signed)
OUTPATIENT PHYSICAL THERAPY THORACOLUMBAR/HIP TREATMENT/Physical Therapy Progress Note   Dates of reporting period  09/20/2022   to   11/04/2022   Patient Name: Yolanda Jimenez MRN: [id number] DOB:[birth year], 77 y.o., female Today's Date: 11/04/2022  END OF SESSION:  PT End of Session - 11/04/22 0844     Visit Number 10    Number of Visits 24    Date for PT Re-Evaluation 12/13/22    Progress Note Due on Visit 20    PT Start Time 0845    PT Stop Time 0925    PT Time Calculation (min) 40 min    Equipment Utilized During Treatment Gait belt    Activity Tolerance Patient tolerated treatment well;No increased pain    Behavior During Therapy WFL for tasks assessed/performed                Past Medical History:  Diagnosis Date   A-fib ([place])    Aortic atherosclerosis (H[facility])    Arthritis    back   Asthma    AVM (arteriovenous malformation)    Barrett esophagus    C. difficile diarrhea    Carotid stenosis    Complication of anesthesia    very hard to come out of anesthesia and pt states she gets very" loopy" after surgeries   COPD (chronic obstructive pulmonary disease) (HCC)    no inhalers   Depression    Diabetes mellitus without complication ([facility])    borderline   Diverticulosis    GERD (gastroesophageal reflux disease)    Heart murmur    Hx of adenomatous colonic polyps    Hyperlipidemia    IBS (irritable bowel syndrome)    Insomnia    Iron deficiency anemia    Kidney cysts    hx UTIs   Macular degeneration    left eye   Microscopic hematuria    Osteoporosis    mild   PVD (peripheral vascular disease) ([place])    Wears dentures    partial lower   Past Surgical History:  Procedure Laterality Date   BUNIONECTOMY     right foot   CATARACT EXTRACTION     both eyes   CERVICAL CONE BIOPSY     CHOLECYSTECTOMY     COLONOSCOPY     COLONOSCOPY WITH PROPOFOL N/A 01/22/2019   Procedure: COLONOSCOPY WITH BIOPSY;  Surgeon: Lucilla Lame, MD;  Location: [facility];  Service: Endoscopy;  Laterality: N/A;  diabetic - diet controlled   COLONOSCOPY WITH PROPOFOL N/A 06/27/2020   Procedure: COLONOSCOPY WITH BIOPSY;  Surgeon: Lucilla Lame, MD;  Location: [facility];  Service: Endoscopy;  Laterality: N/A;  priority 4   ESOPHAGOGASTRODUODENOSCOPY (EGD) WITH PROPOFOL N/A 08/29/2017   Procedure: ESOPHAGOGASTRODUODENOSCOPY (EGD) WITH PROPOFOL;  Surgeon: Lucilla Lame, MD;  Location: [facility];  Service: Endoscopy;  Laterality: N/A;   FOOT SURGERY     right 2nd toe   HIP ARTHROPLASTY Left 05/16/2018   Procedure: ARTHROPLASTY BIPOLAR HIP (HEMIARTHROPLASTY);  Surgeon: Corky Mull, MD;  Location: [facility];  Service: Orthopedics;  Laterality: Left;   ORIF PERIPROSTHETIC FRACTURE Left 12/10/2021   Procedure: OPEN REDUCTION INTERNAL FIXATION OF A LEFT GREATER TROCHANTERIC FRACTURE;  Surgeon: Corky Mull, MD;  Location: [facility];  Service: Orthopedics;  Laterality: Left;   ORIF WRIST FRACTURE Right 10/07/2020   Procedure: OPEN REDUCTION INTERNAL FIXATION (ORIF) WRIST FRACTURE;  Surgeon: Hessie Knows, MD;  Location: [facility];  Service: Orthopedics;  Laterality: Right;   POLYPECTOMY N/A 01/22/2019  Procedure: POLYPECTOMY;  Surgeon: Lucilla Lame, MD;  Location: [facility];  Service: Endoscopy;  Laterality: N/A;  2 Clips placed at polyp removal site in ascending colon   POLYPECTOMY N/A 06/27/2020   Procedure: POLYPECTOMY;  Surgeon: Lucilla Lame, MD;  Location: [place];  Service: Endoscopy;  Laterality: N/A;   TUBAL LIGATION     UPPER GASTROINTESTINAL ENDOSCOPY     Patient Active Problem List   Diagnosis Date Noted   Trochanteric avulsion fracture of femur ([place]) 12/10/2021   Personal history of colonic polyps    Polyp of transverse colon    Major depressive disorder, recurrent, mild ([place]) 04/08/2020   Loss of weight    Polyp of ascending colon    Moderate protein-calorie malnutrition ([place]) 09/25/2018   Tubular adenoma 09/25/2018    Adult idiopathic generalized osteoporosis 08/03/2018   Aortic atherosclerosis ([place]) 08/03/2018   Panlobular emphysema ([place]) 08/03/2018   Cellulitis of left hip 06/01/2018   Status post hip hemiarthroplasty 05/17/2018   Hip fracture ([place]) 05/15/2018   Tobacco abuse 04/04/2018   DM type 2 with diabetic mixed hyperlipidemia ([place]) 03/08/2018   Low serum vitamin D 09/09/2017   History of Barrett's esophagus    Esophagitis, unspecified    PAD (peripheral artery disease) ([place]) 10/15/2016   Carotid stenosis 10/15/2016   Pain in toe 09/17/2016   Renal cyst, acquired, left 09/13/2016   Medicare annual wellness visit, initial 09/03/2016   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 Emily Filbert  REFERRING PROVIDER: Marlene Lard  REFERRING DIAG:  [phone] (ICD-10-CM) - Spondylosis without myelopathy or radiculopathy, lumbar region  S72.112D (ICD-10-CM) - Displaced fracture of greater trochanter of left femur, subsequent encounter for closed fracture with routine healing  Z96.649 (ICD-10-CM) - Presence of unspecified artificial hip joint  M79.605 (ICD-10-CM) - Pain in left leg    Rationale for Evaluation and Treatment: Rehabilitation  THERAPY DIAG:  Difficulty in walking, not elsewhere classified  Abnormality of gait and mobility  Muscle weakness (generalized)  Pain in left  leg  ONSET DATE: June 2023  SUBJECTIVE:  SUBJECTIVE STATEMENT: Patient reports she feels the roller did help- not as touchy but continues to endorse left LE pain (entire leg - not just hip) after few min of walking.   PERTINENT HISTORY:  Patient is a 77 year old female with referral for PT for lumbar spondylosis and radicular symptoms as well as left Hip surgeries (Left Hemiarthroplasty in 2019 from fall and ORIF in April 2023 from another fall). Patient denies any other falls since then and states she was doing well immediately after her last surgery but began to experience some increased Left LE pain from thigh down to calf- intermittent - rates at 5-6/10 with some very infrequent sharp left lateral hip pain. She reports pain as "like a headache in my leg" and states pain worsens with standing/walking.   PAIN:  Are you having pain? Yes: NPRS scale: 0/10 at rest and 5-6/10 pain going down left LE and very sharp infrequent left hip pain-10/10 Pain location: anteriolateral Left thigh down into calf Pain description: ache Aggravating factors: sleeping on left side, Walking Relieving factors: Rest, tylenol, pain relieving gel- [employer]  PRECAUTIONS: Fall  WEIGHT BEARING RESTRICTIONS: No  FALLS:  Has patient fallen in last 6 months? No and several falls in 2023  LIVING ENVIRONMENT: Lives with: lives alone Lives in: House/apartment Stairs: No Has following equipment at home: Single point cane, Environmental consultant - 2 wheeled, Wheelchair (manual), and Flat entry  OCCUPATION: Retired Tax adviser  PLOF: Independent  PATIENT GOALS: Stop the pain and be able to walk more  NEXT MD VISIT: Unsure  OBJECTIVE:   DIAGNOSTIC FINDINGS:  Narrative & Impression  CLINICAL DATA:  Fall and pain.   EXAM: DG HIP (WITH  OR WITHOUT PELVIS) 2-3V LEFT   COMPARISON:  Left hip radiographs dating back to 12/06/2021.   FINDINGS: Left the prosthesis appears intact. Status post prior fixation of fracture of greater trochanter of the left femur with fixation plate and cerclage wire. No new fracture. No dislocation. The bones are osteopenic. The soft tissues are unremarkable. Vascular calcifications noted multiple cutaneous staples noted over the left hip.   IMPRESSION: No acute fracture.     Electronically Signed   By: Anner Crete M.D.   On: 12/13/2021 00:31    PATIENT SURVEYS:  FOTO 52/predicted value = 59     SCREENING FOR RED FLAGS: Bowel or bladder incontinence: No Spinal tumors: No Cauda equina syndrome: No Compression fracture: No Abdominal aneurysm: No  COGNITION: Overall cognitive status: Within functional limits for tasks assessed     SENSATION: Light touch: Impaired   MUSCLE LENGTH: Hamstrings: Right 90 deg; Left 75 deg    POSTURE: No Significant postural limitations  PALPATION: No tenderness along left greater trochanter region today.   LUMBAR ROM:   AROM eval  Flexion Fingers to ankles  Extension WNL  Right lateral flexion Fingers to fibular head  Left lateral flexion Fingers to mid calf  Right rotation Painful yet WNL  Left rotation WNL   (Blank rows = not tested)   LUMBAR SPECIAL TESTS:  Straight leg raise test: Negative and FABER test: Negative  FUNCTIONAL TESTS:  6 minute walk test: 750 feet in 3 min 20 sec - Stopped due to 6/10 left LE radiuclar pain down to calf  GAIT: Distance walked: 750 Assistive device utilized:  None Level of assistance: SBA Comments: Initially slow reciprocal steps- mild unsteadiness yet after 1 min walking- observed significant increase in limp on left LE     TODAY'S TREATMENT:  DATE:  11/04/2022  Per note from Dr. Roland Rack on 3/22: Please emphasize IT band stretching (roller exercises, etc.) to help reduce left greater troch symptoms. Also, consider ultrasound, etc. to reduce left greater troch symptoms.     Manual Therapy:   Rolling sick to Left posterior lateral hip region- TFL/Gluteal met/glutes/IT band and left lateral hamstring region x 10 min STM to left IT band- gentle cross friction massage  Modalities:  Ultrasound: 1.5 cm squared with 3 MHzx 8 minutes to right lateral hip (TFL down toward greater trochanter region/IT band/posterior upp glutes)   Physical therapy treatment session today consisted of completing assessment of goals and administration of testing as demonstrated and documented in flow sheet, treatment, and goals section of this note. Addition treatments may be found below.     PATIENT EDUCATION:  Education details: Purpose of PT, anatomy of lumbar/hip region, Def of radiuculopathy, Instruction in HEP Person educated: Patient Education method: Explanation, Demonstration, Tactile cues, Verbal cues, and Handouts Education comprehension: verbalized understanding, returned demonstration, verbal cues required, tactile cues required, and needs further education  HOME EXERCISE PROGRAM: Access Code: [email or web address] URL: [email or web address] Date: 09/23/2022 Prepared by: Sande Brothers  Exercises - Supine Gluteal Sets  - 1 x daily - 3 x weekly - 3 sets - 10 reps - Supine Hip Abduction  - 1 x daily - 3 x weekly - 3 sets - 10 reps - Supine Isometric Hip Adduction with Pillow at Knees  - 1 x daily - 3 x weekly - 3 sets - 10 reps - Quad Set  - 1 x daily - 3 x weekly - 3 sets - 10 reps - Supine Heel Slide  - 1 x daily - 3 x weekly - 3 sets - 10 reps     Access Code: [email or web address] URL: [email or web address] Date: 09/20/2022 Prepared by: Sande Brothers  Exercises - Sidelying Hip Flexor Stretch with Caregiver  - 1 x daily - 7 x  weekly - 3 sets - 30 hold - Seated Hamstring Stretch  - 1 x daily - 7 x weekly - 3 sets - 30 hold  ASSESSMENT:  CLINICAL IMPRESSION:  Patient presents with self reported improved pain after manual rolling and ultrasound. She was then reassessed with mixed results - she did demo improvement in FOTO and 10 MWT yet no improvement with 6 min walk. She has only just recently started more manual techniques with left hip and initiated Ultrasound per MD request and will continue to assess to see if any benefit. Patient's condition has the potential to improve in response to therapy. Maximum improvement is yet to be obtained. The anticipated improvement is attainable and reasonable in a generally predictable time.  Patient will continue to benefit from skilled PT services to improve her functional LE strength and mobility for optimal functional independence and decreased risk of falling.    OBJECTIVE IMPAIRMENTS: Abnormal gait, decreased activity tolerance, decreased balance, decreased endurance, decreased mobility, difficulty walking, decreased ROM, decreased strength, hypomobility, impaired perceived functional ability, impaired flexibility, and pain.   ACTIVITY LIMITATIONS: carrying, lifting, bending, standing, squatting, sleeping, stairs, transfers, and bed mobility  PARTICIPATION LIMITATIONS: cleaning, laundry, shopping, community activity, and yard work  PERSONAL FACTORS: Time since onset of injury/illness/exacerbation and 3+ comorbidities: arthritis, DM, previous surgeries to left hip  are also affecting patient's functional outcome.   REHAB POTENTIAL: Good  CLINICAL DECISION MAKING: Evolving/moderate complexity  EVALUATION COMPLEXITY: Moderate   GOALS: Goals reviewed with patient? Yes  SHORT TERM GOALS: Target date: 11/01/2022  Pt will be independent with HEP in order to improve strength and balance in order to decrease fall risk and improve function at home. Baseline: Eval- no formal HEP  in place; 11/04/2022= Patient reports doing her current HEP without questions or concerns.  Goal status: Met  2.  Pt will decrease worst back/LLE pain as reported on NPRS by at least 2 points in order to demonstrate clinically significant reduction in back pain.  Baseline: EVAL= 6/10 LLE pain; 11/04/2022= 0/10 at rest but still up to around 6/10 with increased walking Goal status: Ongoing   LONG TERM GOALS: Target date: 12/13/2022  Pt will improve FOTO to target score of 59 to display perceived improvements in ability to complete ADL's.  Baseline: Eval = 52; 11/04/2022= 56 Goal status: Ongoing  2.  Pt will increase 10MWT by at least 0.13 m/s in order to demonstrate clinically significant improvement in community ambulation.   Baseline: EVAL= 0.7 m/s; 11/04/2022= 0.81 m/s Goal status: Ongoing  3.  Pt will increase 6MWT (able to complete the test and walk > 1200 feet)  in order to demonstrate clinically significant improvement in cardiopulmonary endurance and community ambulation  Baseline: EVAL= 750 feet in 3 min 20 sec; 11/04/2022= 610 feet in 2 min 50 sec  Goal status: Ongoing  4.  Pt will decrease worst back/LLE pain as reported on NPRS by at least 3 points in order to demonstrate clinically significant reduction in back pain.  Baseline: EVAL= 6/10 with walking Left LE pain Goal status: INITIAL  5.  Patient will demonstrate improved ability to stand > 30 min without rest break for improved functional strength and independence with activities.  Baseline: EVAL= Patient unstable to stand > 6 min during eval- complaining of increased LLE pain with standing walking. 3/28/20224= Patient ambulated around 3 min prior to onset of left LE symptoms Goal status: ONGOING   PLAN:  PT FREQUENCY: 1-2x/week  PT DURATION: 12 weeks  PLANNED INTERVENTIONS: Therapeutic exercises, Therapeutic activity, Neuromuscular re-education, Balance training, Gait training, Patient/Family education, Self Care, Joint  mobilization, Joint manipulation, Stair training, Vestibular training, Canalith repositioning, DME instructions, Dry Needling, Electrical stimulation, Spinal manipulation, Spinal mobilization, Cryotherapy, Moist heat, Taping, and Manual therapy. Ultrasound per MD request on 10/29/2022 office note  PLAN FOR NEXT SESSION: Continue with LE strengthening and  Pain management strategies including IT rolling/ultrasound   Ollen Bowl, PT Physical Therapist- [facility]  11/04/2022, 5:11 PM

## 2022-11-08 ENCOUNTER — Ambulatory Visit: Payer: PPO | Attending: Student

## 2022-11-08 DIAGNOSIS — M6281 Muscle weakness (generalized): Secondary | ICD-10-CM

## 2022-11-08 DIAGNOSIS — R262 Difficulty in walking, not elsewhere classified: Secondary | ICD-10-CM

## 2022-11-08 DIAGNOSIS — M79605 Pain in left leg: Secondary | ICD-10-CM | POA: Diagnosis not present

## 2022-11-08 DIAGNOSIS — R269 Unspecified abnormalities of gait and mobility: Secondary | ICD-10-CM

## 2022-11-08 NOTE — Therapy (Signed)
OUTPATIENT PHYSICAL THERAPY THORACOLUMBAR/HIP TREATMENT   Patient Name: Yolanda Jimenez MRN: [id number] DOB:1946/07/25, 77 y.o., female Today's Date: 11/08/2022  END OF SESSION:       Past Medical History:  Diagnosis Date   A-fib [facility])    Aortic atherosclerosis ([place])    Arthritis    back   Asthma    AVM (arteriovenous malformation)    Barrett esophagus    C. difficile diarrhea    Carotid stenosis    Complication of anesthesia    very hard to come out of anesthesia and pt states she gets very" loopy" after surgeries   COPD (chronic obstructive pulmonary disease) (HCC)    no inhalers   Depression    Diabetes mellitus without complication (HCC)    borderline   Diverticulosis    GERD (gastroesophageal reflux disease)    Heart murmur    Hx of adenomatous colonic polyps    Hyperlipidemia    IBS (irritable bowel syndrome)    Insomnia    Iron deficiency anemia    Kidney cysts    hx UTIs   Macular degeneration    left eye   Microscopic hematuria    Osteoporosis    mild   PVD (peripheral vascular disease) ([place])    Wears dentures    partial lower   Past Surgical History:  Procedure Laterality Date   BUNIONECTOMY     right foot   CATARACT EXTRACTION     both eyes   CERVICAL CONE BIOPSY     CHOLECYSTECTOMY     COLONOSCOPY     COLONOSCOPY WITH PROPOFOL N/A 01/22/2019   Procedure: COLONOSCOPY WITH BIOPSY;  Surgeon: Lucilla Lame, MD;  Location: [place];  Service: Endoscopy;  Laterality: N/A;  diabetic - diet controlled   COLONOSCOPY WITH PROPOFOL N/A 06/27/2020   Procedure: COLONOSCOPY WITH BIOPSY;  Surgeon: Lucilla Lame, MD;  Location: [place];  Service: Endoscopy;  Laterality: N/A;  priority 4   ESOPHAGOGASTRODUODENOSCOPY (EGD) WITH PROPOFOL N/A 08/29/2017   Procedure: ESOPHAGOGASTRODUODENOSCOPY (EGD) WITH PROPOFOL;  Surgeon: Lucilla Lame, MD;  Location: [place];  Service: Endoscopy;  Laterality: N/A;   FOOT SURGERY     right 2nd  toe   HIP ARTHROPLASTY Left 05/16/2018   Procedure: ARTHROPLASTY BIPOLAR HIP (HEMIARTHROPLASTY);  Surgeon: Corky Mull, MD;  Location: [facility];  Service: Orthopedics;  Laterality: Left;   ORIF PERIPROSTHETIC FRACTURE Left 12/10/2021   Procedure: OPEN REDUCTION INTERNAL FIXATION OF A LEFT GREATER TROCHANTERIC FRACTURE;  Surgeon: Corky Mull, MD;  Location: [facility];  Service: Orthopedics;  Laterality: Left;   ORIF WRIST FRACTURE Right 10/07/2020   Procedure: OPEN REDUCTION INTERNAL FIXATION (ORIF) WRIST FRACTURE;  Surgeon: Hessie Knows, MD;  Location: [facility];  Service: Orthopedics;  Laterality: Right;   POLYPECTOMY N/A 01/22/2019   Procedure: POLYPECTOMY;  Surgeon: Lucilla Lame, MD;  Location: [place];  Service: Endoscopy;  Laterality: N/A;  2 Clips placed at polyp removal site in ascending colon   POLYPECTOMY N/A 06/27/2020   Procedure: POLYPECTOMY;  Surgeon: Lucilla Lame, MD;  Location: [place];  Service: Endoscopy;  Laterality: N/A;   TUBAL LIGATION     UPPER GASTROINTESTINAL ENDOSCOPY     Patient Active Problem List   Diagnosis Date Noted   Trochanteric avulsion fracture of femur 12/10/2021   Personal history of colonic polyps    Polyp of transverse colon    Major depressive disorder, recurrent, mild 04/08/2020   Loss of weight  Polyp of ascending colon    Moderate protein-calorie malnutrition 09/25/2018   Tubular adenoma 09/25/2018   Adult idiopathic generalized osteoporosis 08/03/2018   Aortic atherosclerosis 08/03/2018   Panlobular emphysema 08/03/2018   Cellulitis of left hip 06/01/2018   Status post hip hemiarthroplasty 05/17/2018   Hip fracture 05/15/2018   Tobacco abuse 04/04/2018   DM type 2 with diabetic mixed hyperlipidemia 03/08/2018   Low serum vitamin D 09/09/2017   History of Barrett's esophagus    Esophagitis, unspecified    PAD (peripheral artery disease) 10/15/2016   Carotid stenosis 10/15/2016   Pain in toe 09/17/2016   Renal  cyst, acquired, left 09/13/2016   Medicare annual wellness visit, initial 09/03/2016   Type 2 diabetes mellitus with peripheral angiopathy 08/29/2014   Combined fat and carbohydrate induced hyperlipemia 08/29/2014   Incomplete emptying of bladder 07/24/2014   Increased frequency of urination 03/19/2014   Lower abdominal pain 03/02/2014   Microscopic hematuria 03/01/2014   Nocturia 03/01/2014   Urinary tract infection, site not specified 03/01/2014   Diarrhea 03/05/2013   COLONIC POLYPS, ADENOMATOUS 09/05/2007   Hyperlipidemia 09/05/2007   IRON DEFICIENCY 09/05/2007   BARRETTS ESOPHAGUS 09/05/2007   DIVERTICULOSIS, COLON 09/05/2007   IRRITABLE BOWEL SYNDROME 09/05/2007   ARTERIOVENOUS MALFORMATION 09/05/2007    PCP: Dr. Emily Filbert  REFERRING PROVIDER: Marlene Lard  REFERRING DIAG:  [phone] (ICD-10-CM) - Spondylosis without myelopathy or radiculopathy, lumbar region  S72.112D (ICD-10-CM) - Displaced fracture of greater trochanter of left femur, subsequent encounter for closed fracture with routine healing  Z96.649 (ICD-10-CM) - Presence of unspecified artificial hip joint  M79.605 (ICD-10-CM) - Pain in left leg    Rationale for Evaluation and Treatment: Rehabilitation  THERAPY DIAG:  No diagnosis found.  ONSET DATE: June 2023  SUBJECTIVE:                                                                                                                                                                                           SUBJECTIVE STATEMENT: Patient reports increased pain since yesterday- states she didn't  do anything different - except more sedentary over the weekend.     PERTINENT HISTORY:  Patient is a 77 year old female with referral for PT for lumbar spondylosis and radicular symptoms as well as left Hip surgeries (Left Hemiarthroplasty in 2019 from fall and ORIF in April 2023 from another fall). Patient denies any other falls since then and states she was doing  well immediately after her last surgery but began to experience some increased Left LE pain from thigh down to calf- intermittent - rates at 5-6/10 with some very infrequent sharp  left lateral hip pain. She reports pain as "like a headache in my leg" and states pain worsens with standing/walking.   PAIN:  Are you having pain? Yes: NPRS scale: 0/10 at rest and 7/10 pain going down left LE and very sharp infrequent left hip pain-10/10 Pain location: anteriolateral Left thigh down into calf Pain description: ache Aggravating factors: sleeping on left side, Walking Relieving factors: Rest, tylenol, pain relieving gel- [employer]  PRECAUTIONS: Fall  WEIGHT BEARING RESTRICTIONS: No  FALLS:  Has patient fallen in last 6 months? No and several falls in 2023  LIVING ENVIRONMENT: Lives with: lives alone Lives in: House/apartment Stairs: No Has following equipment at home: Single point cane, Environmental consultant - 2 wheeled, Wheelchair (manual), and Flat entry  OCCUPATION: Retired Tax adviser  PLOF: Independent  PATIENT GOALS: Stop the pain and be able to walk more  NEXT MD VISIT: Unsure  OBJECTIVE:   DIAGNOSTIC FINDINGS:  Narrative & Impression  CLINICAL DATA:  Fall and pain.   EXAM: DG HIP (WITH OR WITHOUT PELVIS) 2-3V LEFT   COMPARISON:  Left hip radiographs dating back to 12/06/2021.   FINDINGS: Left the prosthesis appears intact. Status post prior fixation of fracture of greater trochanter of the left femur with fixation plate and cerclage wire. No new fracture. No dislocation. The bones are osteopenic. The soft tissues are unremarkable. Vascular calcifications noted multiple cutaneous staples noted over the left hip.   IMPRESSION: No acute fracture.     Electronically Signed   By: Anner Crete M.D.   On: 12/13/2021 00:31    PATIENT SURVEYS:  FOTO 52/predicted value = 59     SCREENING FOR RED FLAGS: Bowel or bladder incontinence: No Spinal tumors: No Cauda  equina syndrome: No Compression fracture: No Abdominal aneurysm: No  COGNITION: Overall cognitive status: Within functional limits for tasks assessed     SENSATION: Light touch: Impaired   MUSCLE LENGTH: Hamstrings: Right 90 deg; Left 75 deg    POSTURE: No Significant postural limitations  PALPATION: No tenderness along left greater trochanter region today.   LUMBAR ROM:   AROM eval  Flexion Fingers to ankles  Extension WNL  Right lateral flexion Fingers to fibular head  Left lateral flexion Fingers to mid calf  Right rotation Painful yet WNL  Left rotation WNL   (Blank rows = not tested)   LUMBAR SPECIAL TESTS:  Straight leg raise test: Negative and FABER test: Negative  FUNCTIONAL TESTS:  6 minute walk test: 750 feet in 3 min 20 sec - Stopped due to 6/10 left LE radiuclar pain down to calf  GAIT: Distance walked: 750 Assistive device utilized:  None Level of assistance: SBA Comments: Initially slow reciprocal steps- mild unsteadiness yet after 1 min walking- observed significant increase in limp on left LE     TODAY'S TREATMENT:  DATE: 11/04/2022  Per note from Dr. Roland Rack on 3/22: Please emphasize IT band stretching (roller exercises, etc.) to help reduce left greater troch symptoms. Also, consider ultrasound, etc. to reduce left greater troch symptoms.    Modalities:  Ultrasound: 1.5 cm squared with 3 MHzx 8 minutes to right lateral hip (TFL down toward greater trochanter region/IT band/posterior upp glutes)    Manual Therapy:   Manual stretching in supine to left lower lumbar rotation- hold 30 sec x 3 Manual gentle stretching to left Hip (IR/ER) x 30 sec hold x3 left  Manual hamstring stretch x 60 sec hold x1 Manual gentle piriformis stretch-20 sec hold- Patient reported very tight.   Rolling sick to Left posterior lateral hip  region- TFL/Gluteal met/glutes/IT band and left lateral hamstring region x 10 min STM to left IT band- gentle cross friction massage  Therex:  Reviewed self stretching to left LE - piriformis, figure 4 and hamstring- Patient required VC and visual demo to perform correctly- particularly piriformis stretching- hold 20 sec x 2      PATIENT EDUCATION:  Education details: Purpose of PT, anatomy of lumbar/hip region, Def of radiuculopathy, Instruction in HEP Person educated: Patient Education method: Explanation, Demonstration, Tactile cues, Verbal cues, and Handouts Education comprehension: verbalized understanding, returned demonstration, verbal cues required, tactile cues required, and needs further education  HOME EXERCISE PROGRAM: Access Code: [email or web address] URL: [email or web address] Date: 09/23/2022 Prepared by: Sande Brothers  Exercises - Supine Gluteal Sets  - 1 x daily - 3 x weekly - 3 sets - 10 reps - Supine Hip Abduction  - 1 x daily - 3 x weekly - 3 sets - 10 reps - Supine Isometric Hip Adduction with Pillow at Knees  - 1 x daily - 3 x weekly - 3 sets - 10 reps - Quad Set  - 1 x daily - 3 x weekly - 3 sets - 10 reps - Supine Heel Slide  - 1 x daily - 3 x weekly - 3 sets - 10 reps     Access Code: [email or web address] URL: [email or web address] Date: 09/20/2022 Prepared by: Sande Brothers  Exercises - Sidelying Hip Flexor Stretch with Caregiver  - 1 x daily - 7 x weekly - 3 sets - 30 hold - Seated Hamstring Stretch  - 1 x daily - 7 x weekly - 3 sets - 30 hold  ASSESSMENT:  CLINICAL IMPRESSION:  Patient presents with increased tightness and pain overall versus previous sessions. She did report feeling much better after session- patient down to a 5/10 (yet still more than her usual 0/10 at rest or without significant walking. Patient to see Physiatrist on Wednesday and will wait to see if any new recommendations next visit.  Patient will continue to  benefit from skilled PT services to improve her functional LE strength and mobility for optimal functional independence and decreased risk of falling.    OBJECTIVE IMPAIRMENTS: Abnormal gait, decreased activity tolerance, decreased balance, decreased endurance, decreased mobility, difficulty walking, decreased ROM, decreased strength, hypomobility, impaired perceived functional ability, impaired flexibility, and pain.   ACTIVITY LIMITATIONS: carrying, lifting, bending, standing, squatting, sleeping, stairs, transfers, and bed mobility  PARTICIPATION LIMITATIONS: cleaning, laundry, shopping, community activity, and yard work  PERSONAL FACTORS: Time since onset of injury/illness/exacerbation and 3+ comorbidities: arthritis, DM, previous surgeries to left hip  are also affecting patient's functional outcome.   REHAB POTENTIAL: Good  CLINICAL DECISION MAKING: Evolving/moderate complexity  EVALUATION COMPLEXITY: Moderate   GOALS: Goals reviewed with patient? Yes  SHORT TERM  GOALS: Target date: 11/01/2022  Pt will be independent with HEP in order to improve strength and balance in order to decrease fall risk and improve function at home. Baseline: Eval- no formal HEP in place; 11/04/2022= Patient reports doing her current HEP without questions or concerns.  Goal status: Met  2.  Pt will decrease worst back/LLE pain as reported on NPRS by at least 2 points in order to demonstrate clinically significant reduction in back pain.  Baseline: EVAL= 6/10 LLE pain; 11/04/2022= 0/10 at rest but still up to around 6/10 with increased walking Goal status: Ongoing   LONG TERM GOALS: Target date: 12/13/2022  Pt will improve FOTO to target score of 59 to display perceived improvements in ability to complete ADL's.  Baseline: Eval = 52; 11/04/2022= 56 Goal status: Ongoing  2.  Pt will increase 10MWT by at least 0.13 m/s in order to demonstrate clinically significant improvement in community ambulation.    Baseline: EVAL= 0.7 m/s; 11/04/2022= 0.81 m/s Goal status: Ongoing  3.  Pt will increase 6MWT (able to complete the test and walk > 1200 feet)  in order to demonstrate clinically significant improvement in cardiopulmonary endurance and community ambulation  Baseline: EVAL= 750 feet in 3 min 20 sec; 11/04/2022= 610 feet in 2 min 50 sec  Goal status: Ongoing  4.  Pt will decrease worst back/LLE pain as reported on NPRS by at least 3 points in order to demonstrate clinically significant reduction in back pain.  Baseline: EVAL= 6/10 with walking Left LE pain Goal status: INITIAL  5.  Patient will demonstrate improved ability to stand > 30 min without rest break for improved functional strength and independence with activities.  Baseline: EVAL= Patient unstable to stand > 6 min during eval- complaining of increased LLE pain with standing walking. 3/28/20224= Patient ambulated around 3 min prior to onset of left LE symptoms Goal status: ONGOING   PLAN:  PT FREQUENCY: 1-2x/week  PT DURATION: 12 weeks  PLANNED INTERVENTIONS: Therapeutic exercises, Therapeutic activity, Neuromuscular re-education, Balance training, Gait training, Patient/Family education, Self Care, Joint mobilization, Joint manipulation, Stair training, Vestibular training, Canalith repositioning, DME instructions, Dry Needling, Electrical stimulation, Spinal manipulation, Spinal mobilization, Cryotherapy, Moist heat, Taping, and Manual therapy. Ultrasound per MD request on 10/29/2022 office note  PLAN FOR NEXT SESSION: Continue with LE strengthening and  Pain management strategies including IT rolling/ultrasound   Ollen Bowl, PT Physical Therapist- [facility]  11/08/2022, 6:50 AM

## 2022-11-09 DIAGNOSIS — J431 Panlobular emphysema: Secondary | ICD-10-CM | POA: Diagnosis not present

## 2022-11-09 DIAGNOSIS — D500 Iron deficiency anemia secondary to blood loss (chronic): Secondary | ICD-10-CM | POA: Diagnosis not present

## 2022-11-09 DIAGNOSIS — Z0000 Encounter for general adult medical examination without abnormal findings: Secondary | ICD-10-CM | POA: Diagnosis not present

## 2022-11-09 DIAGNOSIS — R634 Abnormal weight loss: Secondary | ICD-10-CM | POA: Diagnosis not present

## 2022-11-09 DIAGNOSIS — F330 Major depressive disorder, recurrent, mild: Secondary | ICD-10-CM | POA: Diagnosis not present

## 2022-11-10 ENCOUNTER — Other Ambulatory Visit: Payer: Self-pay | Admitting: Internal Medicine

## 2022-11-10 ENCOUNTER — Other Ambulatory Visit: Payer: Self-pay | Admitting: Family Medicine

## 2022-11-10 DIAGNOSIS — Z0000 Encounter for general adult medical examination without abnormal findings: Secondary | ICD-10-CM

## 2022-11-10 DIAGNOSIS — M545 Low back pain, unspecified: Secondary | ICD-10-CM | POA: Diagnosis not present

## 2022-11-10 DIAGNOSIS — M79605 Pain in left leg: Secondary | ICD-10-CM | POA: Diagnosis not present

## 2022-11-10 DIAGNOSIS — R634 Abnormal weight loss: Secondary | ICD-10-CM

## 2022-11-10 DIAGNOSIS — M5416 Radiculopathy, lumbar region: Secondary | ICD-10-CM | POA: Diagnosis not present

## 2022-11-10 DIAGNOSIS — M5136 Other intervertebral disc degeneration, lumbar region: Secondary | ICD-10-CM | POA: Diagnosis not present

## 2022-11-10 DIAGNOSIS — D500 Iron deficiency anemia secondary to blood loss (chronic): Secondary | ICD-10-CM

## 2022-11-11 ENCOUNTER — Ambulatory Visit: Payer: PPO

## 2022-11-11 DIAGNOSIS — R262 Difficulty in walking, not elsewhere classified: Secondary | ICD-10-CM

## 2022-11-11 DIAGNOSIS — M6281 Muscle weakness (generalized): Secondary | ICD-10-CM

## 2022-11-11 DIAGNOSIS — M79605 Pain in left leg: Secondary | ICD-10-CM

## 2022-11-11 DIAGNOSIS — R269 Unspecified abnormalities of gait and mobility: Secondary | ICD-10-CM

## 2022-11-11 NOTE — Therapy (Signed)
Patient Name: CALLE RENTSCHLER MRN: [id number] DOB:May 03, 1946, 77 y.o., female Today's Date: 11/11/2022  Suong arrived for her PT visit - limping in and stating her pain with walking can by up to 10/10 and as low as 0/10 at rest. She states she went to Physiatry and they are scheduling her an MRI of her back and hip. States only temporary relief with PT but then states her pain is much worse overall. Discussed holding PT services until patient has MRI and discussion or follow up with MD prior to resuming secondary to ongoing pain with minimal relief with conservative treatment to date. Patient verbalized agreement with plan. Will cancel next Tues visit and await phone call from patient next week and continue or hold as appropriate.    Lewis Moccasin, PT 11/11/2022, 9:35 AM

## 2022-11-15 ENCOUNTER — Ambulatory Visit
Admission: RE | Admit: 2022-11-15 | Discharge: 2022-11-15 | Disposition: A | Discharge status: Home / Self Care | Payer: PPO | Source: Ambulatory Visit | Attending: Family Medicine | Admitting: Family Medicine

## 2022-11-15 DIAGNOSIS — M25552 Pain in left hip: Secondary | ICD-10-CM | POA: Diagnosis not present

## 2022-11-15 DIAGNOSIS — M545 Low back pain, unspecified: Secondary | ICD-10-CM | POA: Diagnosis not present

## 2022-11-15 DIAGNOSIS — M5416 Radiculopathy, lumbar region: Secondary | ICD-10-CM

## 2022-11-15 DIAGNOSIS — M48061 Spinal stenosis, lumbar region without neurogenic claudication: Secondary | ICD-10-CM | POA: Diagnosis not present

## 2022-11-16 ENCOUNTER — Ambulatory Visit: Payer: PPO

## 2022-11-18 ENCOUNTER — Ambulatory Visit: Payer: PPO | Admitting: Physical Therapy

## 2022-11-18 ENCOUNTER — Ambulatory Visit
Admission: RE | Admit: 2022-11-18 | Discharge: 2022-11-18 | Disposition: A | Discharge status: Home / Self Care | Payer: PPO | Source: Ambulatory Visit | Attending: Internal Medicine | Admitting: Internal Medicine

## 2022-11-18 DIAGNOSIS — R634 Abnormal weight loss: Secondary | ICD-10-CM

## 2022-11-18 DIAGNOSIS — J984 Other disorders of lung: Secondary | ICD-10-CM | POA: Diagnosis not present

## 2022-11-18 DIAGNOSIS — D509 Iron deficiency anemia, unspecified: Secondary | ICD-10-CM | POA: Diagnosis not present

## 2022-11-18 DIAGNOSIS — K439 Ventral hernia without obstruction or gangrene: Secondary | ICD-10-CM | POA: Diagnosis not present

## 2022-11-18 DIAGNOSIS — K5730 Diverticulosis of large intestine without perforation or abscess without bleeding: Secondary | ICD-10-CM | POA: Diagnosis not present

## 2022-11-18 DIAGNOSIS — D500 Iron deficiency anemia secondary to blood loss (chronic): Secondary | ICD-10-CM

## 2022-11-18 DIAGNOSIS — K429 Umbilical hernia without obstruction or gangrene: Secondary | ICD-10-CM | POA: Diagnosis not present

## 2022-11-18 DIAGNOSIS — I700 Atherosclerosis of aorta: Secondary | ICD-10-CM | POA: Diagnosis not present

## 2022-11-18 DIAGNOSIS — Z0000 Encounter for general adult medical examination without abnormal findings: Secondary | ICD-10-CM

## 2022-11-19 ENCOUNTER — Other Ambulatory Visit: Payer: PPO

## 2022-11-22 ENCOUNTER — Ambulatory Visit: Payer: PPO

## 2022-11-23 DIAGNOSIS — M5136 Other intervertebral disc degeneration, lumbar region: Secondary | ICD-10-CM | POA: Diagnosis not present

## 2022-11-23 DIAGNOSIS — M5416 Radiculopathy, lumbar region: Secondary | ICD-10-CM | POA: Diagnosis not present

## 2022-11-29 ENCOUNTER — Ambulatory Visit: Payer: PPO

## 2022-12-01 DIAGNOSIS — M5416 Radiculopathy, lumbar region: Secondary | ICD-10-CM | POA: Diagnosis not present

## 2022-12-01 DIAGNOSIS — M48062 Spinal stenosis, lumbar region with neurogenic claudication: Secondary | ICD-10-CM | POA: Diagnosis not present

## 2022-12-06 DIAGNOSIS — H353221 Exudative age-related macular degeneration, left eye, with active choroidal neovascularization: Secondary | ICD-10-CM | POA: Diagnosis not present

## 2022-12-15 DIAGNOSIS — R634 Abnormal weight loss: Secondary | ICD-10-CM | POA: Diagnosis not present

## 2022-12-15 DIAGNOSIS — D500 Iron deficiency anemia secondary to blood loss (chronic): Secondary | ICD-10-CM | POA: Diagnosis not present

## 2022-12-20 DIAGNOSIS — M48062 Spinal stenosis, lumbar region with neurogenic claudication: Secondary | ICD-10-CM | POA: Diagnosis not present

## 2022-12-20 DIAGNOSIS — M5136 Other intervertebral disc degeneration, lumbar region: Secondary | ICD-10-CM | POA: Diagnosis not present

## 2022-12-20 DIAGNOSIS — M5416 Radiculopathy, lumbar region: Secondary | ICD-10-CM | POA: Diagnosis not present

## 2022-12-23 ENCOUNTER — Inpatient Hospital Stay: Payer: PPO

## 2022-12-23 ENCOUNTER — Inpatient Hospital Stay: Payer: PPO | Attending: Internal Medicine | Admitting: Internal Medicine

## 2022-12-23 ENCOUNTER — Encounter: Payer: Self-pay | Admitting: Internal Medicine

## 2022-12-23 VITALS — BP 113/80 | HR 90 | Temp 99.0°F | Wt 100.1 lb

## 2022-12-23 DIAGNOSIS — F17210 Nicotine dependence, cigarettes, uncomplicated: Secondary | ICD-10-CM | POA: Insufficient documentation

## 2022-12-23 DIAGNOSIS — F32A Depression, unspecified: Secondary | ICD-10-CM | POA: Diagnosis not present

## 2022-12-23 DIAGNOSIS — R351 Nocturia: Secondary | ICD-10-CM | POA: Diagnosis not present

## 2022-12-23 DIAGNOSIS — Z807 Family history of other malignant neoplasms of lymphoid, hematopoietic and related tissues: Secondary | ICD-10-CM | POA: Insufficient documentation

## 2022-12-23 DIAGNOSIS — R3129 Other microscopic hematuria: Secondary | ICD-10-CM | POA: Diagnosis not present

## 2022-12-23 DIAGNOSIS — D509 Iron deficiency anemia, unspecified: Secondary | ICD-10-CM | POA: Diagnosis not present

## 2022-12-23 NOTE — Progress Notes (Signed)
[facility]  [phone] [phone]  ID: Yolanda Jimenez OB: [birth year]  MR#: [id number]  QIO#:[id number]  Patient Care Team: Danella Penton, MD as PCP - General (Unknown Physician Specialty) Michaelyn Barter, MD as Consulting Physician (Oncology)  REFERRING PROVIDER: Dr. Hyacinth Meeker  REASON FOR REFERRAL: Iron deficiency anemia  HPI: Yolanda Jimenez is a 77 y.o. female with past medical history of A-fib, arthritis, asthma, AVM, Barrett's esophagus, carotid stenosis, COPD, diabetes, GERD, IBS, PVD referred to hematology for management of iron deficiency anemia.  Patient reports longstanding history of iron deficiency anemia.  She has been on for count for many years.  Recently has been going through depression due to multiple difficult events happening in her life.  As a result she stopped her iron supplements.  She went back on it 3 to 4 days ago. Reports with her history of IBS sometimes she has voluminous and mushy stools.  After frequent wiping can notice some blood on the tissue paper at the end.   Labs reviewed from 12/15/2022.  Hemoglobin 9.9, WBC 12.6, MCV 80 and platelets 492.  Ferritin 6.  B12 374.  CBC from 11/02/2022 WBC 11.6, hemoglobin 10.4 and platelets 464.  Colonoscopy from 06/27/2020 by Dr. Servando Snare showed multiple polyps and diverticulosis.  Pathology showed tubular adenoma x 5 and hyperplastic polyp x 1.  UA showed 1+ protein and 2+ blood.  RBC 13.  Follows with Dr. Apolinar Junes of urology.  Has nocturia and was started on Mybetriq.  She has also stopped her Effexor.  Chronic left lower extremity pain-started after her hip surgery a year ago.  Is following with orthopedics for cortisone injection.  Reports not helping much.  REVIEW OF SYSTEMS:   ROS  As per HPI. Otherwise, a complete review of systems is negative.  PAST MEDICAL HISTORY: Past Medical History:  Diagnosis Date   A-fib (HCC)    Aortic atherosclerosis (HCC)    Arthritis    back    Asthma    AVM (arteriovenous malformation)    Barrett esophagus    C. difficile diarrhea    Carotid stenosis    Complication of anesthesia    very hard to come out of anesthesia and pt states she gets very" loopy" after surgeries   COPD (chronic obstructive pulmonary disease) (HCC)    no inhalers   Depression    Diabetes mellitus without complication (HCC)    borderline   Diverticulosis    GERD (gastroesophageal reflux disease)    Heart murmur    Hx of adenomatous colonic polyps    Hyperlipidemia    IBS (irritable bowel syndrome)    Insomnia    Iron deficiency anemia    Kidney cysts    hx UTIs   Macular degeneration    left eye   Microscopic hematuria    Osteoporosis    mild   PVD (peripheral vascular disease) (HCC)    Wears dentures    partial lower    PAST SURGICAL HISTORY: Past Surgical History:  Procedure Laterality Date   BUNIONECTOMY     right foot   CATARACT EXTRACTION     both eyes   CERVICAL CONE BIOPSY     CHOLECYSTECTOMY     COLONOSCOPY     COLONOSCOPY WITH PROPOFOL N/A 01/22/2019   Procedure: COLONOSCOPY WITH BIOPSY;  Surgeon: Midge Minium, MD;  Location: [facility];  Service: Endoscopy;  Laterality: N/A;  diabetic - diet controlled   COLONOSCOPY WITH PROPOFOL N/A 06/27/2020  Procedure: COLONOSCOPY WITH BIOPSY;  Surgeon: Midge Minium, MD;  [facility];  Service: Endoscopy;  Laterality: N/A;  priority 4   ESOPHAGOGASTRODUODENOSCOPY (EGD) WITH PROPOFOL N/A 08/29/2017   Procedure: ESOPHAGOGASTRODUODENOSCOPY (EGD) WITH PROPOFOL;  Surgeon: Midge Minium, MD;  Location: [facility];  Service: Endoscopy;  Laterality: N/A;   FOOT SURGERY     right 2nd toe   HIP ARTHROPLASTY Left 05/16/2018   Procedure: ARTHROPLASTY BIPOLAR HIP (HEMIARTHROPLASTY);  Surgeon: Christena Flake, MD;  Location: [facility];  Service: Orthopedics;  Laterality: Left;   ORIF PERIPROSTHETIC FRACTURE Left 12/10/2021   Procedure: OPEN REDUCTION INTERNAL FIXATION  OF A LEFT GREATER TROCHANTERIC FRACTURE;  Surgeon: Christena Flake, MD;  Location: [facility];  Service: Orthopedics;  Laterality: Left;   ORIF WRIST FRACTURE Right 10/07/2020   Procedure: OPEN REDUCTION INTERNAL FIXATION (ORIF) WRIST FRACTURE;  Surgeon: Kennedy Bucker, MD;  Location: [facility];  Service: Orthopedics;  Laterality: Right;   POLYPECTOMY N/A 01/22/2019   Procedure: POLYPECTOMY;  Surgeon: Midge Minium, MD;  Location: [facility];  Service: Endoscopy;  Laterality: N/A;  2 Clips placed at polyp removal site in ascending colon   POLYPECTOMY N/A 06/27/2020   Procedure: POLYPECTOMY;  Surgeon: Midge Minium, MD;  Location: [facility];  Service: Endoscopy;  Laterality: N/A;   TUBAL LIGATION     UPPER GASTROINTESTINAL ENDOSCOPY      FAMILY HISTORY: Family History  Problem Relation Age of Onset   Diabetes Mother    Heart disease Mother    Hypertension Mother    Diabetes Son    Diabetes Sister    Inflammatory bowel disease Father        diverticulitis   Multiple myeloma Father    Hypertension Son    Diabetes Maternal Grandmother    Colon cancer Neg Hx    Esophageal cancer Neg Hx    Rectal cancer Neg Hx    Stomach cancer Neg Hx    Breast cancer Neg Hx     HEALTH MAINTENANCE: Social History   Tobacco Use   Smoking status: Every Day    Packs/day: 0.50    Years: 55.00    Additional pack years: 0.00    Total pack years: 27.50    Types: Cigarettes   Smokeless tobacco: Never   Tobacco comments:    she has patches to start just not started yet. smoked since teenager.  Vaping Use   Vaping Use: Never used  Substance Use Topics   Alcohol use: Yes    Alcohol/week: 2.0 standard drinks of alcohol    Types: 2 Glasses of wine per week    Comment: occ wine 1-2 weekly   Drug use: No     Allergies  Allergen Reactions   Codeine     "felt drunk, crazy"   Hydrocodone Other (See Comments)   Hydrocodone-Acetaminophen Other (See Comments)    Confusion/delirium    Lidocaine     Heart racing, increased BP - from local at dentist (likely epi)   Nsaids Other (See Comments)    Avoids due to IBS symptoms (bleeding symptoms)   Oxycodone     Confusion/delirium    Current Outpatient Medications  Medication Sig Dispense Refill   diazepam (VALIUM) 5 MG tablet Take 1 tablet (5 mg total) by mouth daily as needed for anxiety. (Patient taking differently: Take 5 mg by mouth 2 (two) times daily as needed for anxiety.) 10 tablet 0   ferrous gluconate (FERGON) 240 (27 FE) MG tablet Take  240 mg by mouth in the morning.     acetaminophen (TYLENOL) 500 MG tablet Take 1,000 mg by mouth every 6 (six) hours as needed. (Patient not taking: Reported on 12/23/2022)     Ascorbic Acid (VITAMIN C) 1000 MG tablet Take 1,000 mg by mouth in the morning. (Patient not taking: Reported on 12/23/2022)     B Complex-C-Folic Acid (STRESS B COMPLEX PO) Take 1 capsule by mouth in the morning. (Patient not taking: Reported on 12/23/2022)     Calcium Carb-Cholecalciferol (CALCIUM 600+D3 PO) Take 1 tablet by mouth in the morning. (Patient not taking: Reported on 12/23/2022)     celecoxib (CELEBREX) 200 MG capsule Take by mouth. (Patient not taking: Reported on 12/23/2022)     Cholecalciferol 50 MCG (2000 UT) CAPS Take 2,000 Units by mouth in the morning. (Patient not taking: Reported on 12/23/2022)     Coenzyme Q10 (COQ-10 PO) Take by mouth. (Patient not taking: Reported on 12/23/2022)     CRANBERRY-VITAMIN C PO Take 1 tablet by mouth 3 (three) times a week. (Patient not taking: Reported on 12/23/2022)     denosumab (PROLIA) 60 MG/ML SOSY injection Inject 60 mg into the skin every 6 (six) months. (Patient not taking: Reported on 12/23/2022)     diltiazem (CARDIZEM) 30 MG tablet Take 30 mg by mouth daily before breakfast. (Patient not taking: Reported on 12/23/2022)     fluticasone (FLONASE) 50 MCG/ACT nasal spray Place 1 spray into both nostrils daily as needed for allergies or rhinitis. (Patient not  taking: Reported on 12/23/2022)     ipratropium (ATROVENT) 0.03 % nasal spray SMARTSIG:1-2 Spray(s) Both Nares 3 Times Daily PRN (Patient not taking: Reported on 12/23/2022)     ipratropium (ATROVENT) 0.06 % nasal spray Place into the nose. (Patient not taking: Reported on 12/23/2022)     loperamide (IMODIUM) 2 MG capsule Take 2 mg by mouth every morning. (Patient not taking: Reported on 12/23/2022)     MELATONIN PO Take by mouth. (Patient not taking: Reported on 12/23/2022)     Misc Natural Products (BRAINSTRONG MEMORY SUPPORT PO) Take 2 capsules by mouth 3 (three) times a week. [employer] - Memory & Brain Health (Patient not taking: Reported on 12/23/2022)     Multiple Vitamins-Minerals (PRESERVISION AREDS 2 PO) Take 2 tablets by mouth in the morning. (Patient not taking: Reported on 12/23/2022)     omeprazole (PRILOSEC) 40 MG capsule Take 40 mg by mouth daily before breakfast. (Patient not taking: Reported on 12/23/2022)     simvastatin (ZOCOR) 40 MG tablet Take 40 mg by mouth at bedtime. (Patient not taking: Reported on 12/23/2022)     traMADol (ULTRAM) 50 MG tablet Take 1-2 tablets (50-100 mg total) by mouth every 6 (six) hours as needed for moderate pain or severe pain. (Patient not taking: Reported on 12/23/2022) 30 tablet 0   venlafaxine XR (EFFEXOR-XR) 37.5 MG 24 hr capsule Take 37.5 mg by mouth daily. (Patient not taking: Reported on 12/23/2022)     No current facility-administered medications for this visit.    OBJECTIVE: Vitals:   12/23/22 1026  BP: 113/80  Pulse: 90  Temp: 99 F (37.2 C)  SpO2: 100%     Body mass index is 18.91 kg/m.      General: Well-developed, well-nourished, no acute distress. Eyes: Pink conjunctiva, anicteric sclera. HEENT: Normocephalic, moist mucous membranes, clear oropharnyx. Lungs: Clear to auscultation bilaterally. Heart: Regular rate and rhythm. No rubs, murmurs, or gallops. Abdomen: Soft, nontender, nondistended. No  organomegaly noted,  normoactive bowel sounds. Musculoskeletal: No edema, cyanosis, or clubbing. Neuro: Alert, answering all questions appropriately. Cranial nerves grossly intact. Skin: No rashes or petechiae noted. Psych: Normal affect. Lymphatics: No cervical, calvicular, axillary or inguinal LAD.   LAB RESULTS:  Lab Results  Component Value Date   NA 136 12/12/2021   K 3.7 12/12/2021   CL 104 12/12/2021   CO2 26 12/12/2021   GLUCOSE 123 (H) 12/12/2021   BUN 11 12/12/2021   CREATININE 0.67 12/12/2021   CALCIUM 8.3 (L) 12/12/2021   PROT 7.1 05/15/2018   ALBUMIN 4.2 05/15/2018   AST 25 05/15/2018   ALT 16 05/15/2018   ALKPHOS 62 05/15/2018   BILITOT 0.6 05/15/2018   GFRNONAA >60 12/12/2021   GFRAA >60 05/18/2018    Lab Results  Component Value Date   WBC 7.8 12/12/2021   NEUTROABS 5.6 12/12/2021   HGB 9.2 (L) 12/12/2021   HCT 28.3 (L) 12/12/2021   MCV 95.3 12/12/2021   PLT 317 12/12/2021    Lab Results  Component Value Date   IRONPCTSAT 26.7 05/18/2013   IRONPCTSAT 53.9 (H) 05/14/2011   IRONPCTSAT 16.5 (L) 08/31/2007     STUDIES: No results found.  ASSESSMENT AND PLAN:   Yolanda Jimenez is a 77 y.o. female with pmh of A-fib, arthritis, asthma, AVM, Barrett's esophagus, carotid stenosis, COPD, diabetes, GERD, IBS, PVD referred to hematology for management of iron deficiency anemia.  # Iron deficiency anemia # Microscopic hematuria, chronic -Progressive.  From 12/13/2022-hemoglobin 9.2, ferritin 6.  Reports longstanding history of iron deficiency and is on Fergon 325 mg daily.  Reports she stopped taking all her medication few months ago due to dealing with depression and multiple stressors in her life.  She resumed Fergon 3 to 4 days ago.  Tolerating well.  I discussed about alternatives with IV Venofer 200 mg weekly x 5 doses.  Occasional side effects such as nausea and back pain was discussed.  Rarely allergic reactions can happen.  At this time, patient decided to be more compliant  with her oral iron pills and recheck her levels in 3 months to assess for any improvement.  If her iron levels and hemoglobin do not improve in 3 months, she would like to proceed with IV iron infusions.  -Patient has microscopic hematuria at least since 2019.  Follows with urology.  It can contribute to iron deficiency.  Cannot rule out occult GI bleed.  Her last colonoscopy was in November 2021 with Dr. Servando Snare which showed multiple polyps and diverticulosis.  We discussed about role of endoscopy and colonoscopy to assess for any bleeding source.  Patient does feel that she is going through lot of personal issues and would prefer to wait for 3 months to assess response to iron pills.  I have reached out to Dr. Hyacinth Meeker if she can be provided with Cologuard when she is scheduled for next blood work on June 25 with him.  # Nocturia -Follows with Dr. Apolinar Junes.  She was started on Mybertiq.  Has not taken yet. -Advised to resume.  # Depression -Advised to resume Effexor   Orders Placed This Encounter  Procedures   CBC with Differential/Platelet   Iron and TIBC   Ferritin   RTC in 3 months for MD visit, labs  Patient expressed understanding and was in agreement with this plan. She also understands that She can call clinic at any time with any questions, concerns, or complaints.   I spent a total of  45 minutes reviewing chart data, face-to-face evaluation with the patient, counseling and coordination of care as detailed above.  Michaelyn Barter, MD   12/23/2022 12:05 PM

## 2022-12-23 NOTE — Progress Notes (Signed)
Patient is having severe pain from her left hip down to her ankle. She said she had hip surgery a few months ago. She has more urgency to urinate at night time only. Depression came due to a family member's diagnosis. Pt. Is taking any of her prescription med's except for her ferrous (iron) pills and her valium.

## 2022-12-29 DIAGNOSIS — M48062 Spinal stenosis, lumbar region with neurogenic claudication: Secondary | ICD-10-CM | POA: Diagnosis not present

## 2022-12-29 DIAGNOSIS — M5416 Radiculopathy, lumbar region: Secondary | ICD-10-CM | POA: Diagnosis not present

## 2023-01-11 ENCOUNTER — Ambulatory Visit: Payer: PPO | Admitting: Physician Assistant

## 2023-01-11 ENCOUNTER — Encounter: Payer: PPO | Admitting: Physical Therapy

## 2023-01-11 ENCOUNTER — Encounter: Payer: Self-pay | Admitting: Physician Assistant

## 2023-01-11 VITALS — BP 105/67 | HR 91 | Ht 60.0 in | Wt 99.3 lb

## 2023-01-11 DIAGNOSIS — R351 Nocturia: Secondary | ICD-10-CM

## 2023-01-11 LAB — URINALYSIS, COMPLETE
Bilirubin, UA: NEGATIVE
Glucose, UA: NEGATIVE
Ketones, UA: NEGATIVE
Leukocytes,UA: NEGATIVE
Nitrite, UA: NEGATIVE
Specific Gravity, UA: >1.03 — ABNORMAL HIGH (ref 1.005–1.030)
Urobilinogen, Ur: 0.2 mg/dL (ref 0.2–1.0)
pH, UA: 5.5 (ref 5.0–7.5)

## 2023-01-11 LAB — MICROSCOPIC EXAMINATION

## 2023-01-11 MED ORDER — GEMTESA 75 MG PO TABS
75.0000 mg | ORAL_TABLET | Freq: Every day | ORAL | 0 refills | Status: DC
Start: 2023-01-11 — End: 2023-04-05

## 2023-01-11 NOTE — Patient Instructions (Signed)
Today we talked about your frequent nighttime urination. I strongly suspect that you could have undiagnosed sleep apnea and would benefit from a sleep study. I'll send Dr. Hyacinth Meeker a message about this.  We are going to try to manage your nighttime urination with medication, but if I'm right that this is a symptom of sleep apnea, then I don't think we'll be able to fix this problem. My goal instead is to improve it as much as I can.  I gave you samples of Gemtesa today. Please start this at nighttime, one pill per day. I'll see you back in clinic in 6 weeks to see how you are doing on this medication. Please do not take the Myrbetriq that was recently prescribed to you; you don't need to be on both these medications at the same time.

## 2023-01-11 NOTE — Progress Notes (Signed)
01/11/2023 10:00 PM   Yolanda Jimenez Jan 30, 1946 [id number]  CC: Chief Complaint  Patient presents with   Follow-up   HPI: Yolanda Jimenez is a 77 y.o. female with PMH hematuria with benign cystoscopy in February 2023; left renal cyst; recurrent UTIs; LUTS including urgency, frequency, and weak stream; and nocturia who failed Myrbetriq 25mg  who presents today for follow up of nocturia.   She saw Dr. Alena Bills on 12/23/2022 who encouraged her to resume Myrbetriq for her nocturia. Notably, I have previously encouraged her to pursue sleep study given strong suspicion for OSA with reports of frequent, large volume nighttime voids and daytime somnolence. This has not been done.  Today she reports she is overwhelmed caring for her aging sister, as she is the only family remaining to care for her and her sister is refusing outside help. She is feeling run down and feels that her nocturia, now every 90 minutes, is interfering with rest. She is struggling with short term memory issues that she attributes to her exhaustion. She does not recall seeing Dr. Alena Bills last month.  She denies dysuria, gross hematuria, and urinary incontinence.  In-office UA today positive for 2+ blood and 2+ protein; urine microscopy with 3-10 RBCs/HPF and moderate bacteria.   PMH: Past Medical History:  Diagnosis Date   A-fib (HCC)    Aortic atherosclerosis (HCC)    Arthritis    back   Asthma    AVM (arteriovenous malformation)    Barrett esophagus    C. difficile diarrhea    Carotid stenosis    Complication of anesthesia    very hard to come out of anesthesia and pt states she gets very" loopy" after surgeries   COPD (chronic obstructive pulmonary disease) (HCC)    no inhalers   Depression    Diabetes mellitus without complication (HCC)    borderline   Diverticulosis    GERD (gastroesophageal reflux disease)    Heart murmur    Hx of adenomatous colonic polyps    Hyperlipidemia    IBS (irritable bowel  syndrome)    Insomnia    Iron deficiency anemia    Kidney cysts    hx UTIs   Macular degeneration    left eye   Microscopic hematuria    Osteoporosis    mild   PVD (peripheral vascular disease) (HCC)    Wears dentures    partial lower    Surgical History: Past Surgical History:  Procedure Laterality Date   BUNIONECTOMY     right foot   CATARACT EXTRACTION     both eyes   CERVICAL CONE BIOPSY     CHOLECYSTECTOMY     COLONOSCOPY     COLONOSCOPY WITH PROPOFOL N/A 01/22/2019   Procedure: COLONOSCOPY WITH BIOPSY;  Surgeon: Midge Minium, MD;  Location: [facility];  Service: Endoscopy;  Laterality: N/A;  diabetic - diet controlled   COLONOSCOPY WITH PROPOFOL N/A 06/27/2020   Procedure: COLONOSCOPY WITH BIOPSY;  Surgeon: Midge Minium, MD;  Location: [facility];  Service: Endoscopy;  Laterality: N/A;  priority 4   ESOPHAGOGASTRODUODENOSCOPY (EGD) WITH PROPOFOL N/A 08/29/2017   Procedure: ESOPHAGOGASTRODUODENOSCOPY (EGD) WITH PROPOFOL;  Surgeon: Midge Minium, MD;  Location: [facility];  Service: Endoscopy;  Laterality: N/A;   FOOT SURGERY     right 2nd toe   HIP ARTHROPLASTY Left 05/16/2018   Procedure: ARTHROPLASTY BIPOLAR HIP (HEMIARTHROPLASTY);  Surgeon: Christena Flake, MD;  Location: [facility];  Service: Orthopedics;  Laterality: Left;  ORIF PERIPROSTHETIC FRACTURE Left 12/10/2021   Procedure: OPEN REDUCTION INTERNAL FIXATION OF A LEFT GREATER TROCHANTERIC FRACTURE;  Surgeon: Christena Flake, MD;  Location: [facility];  Service: Orthopedics;  Laterality: Left;   ORIF WRIST FRACTURE Right 10/07/2020   Procedure: OPEN REDUCTION INTERNAL FIXATION (ORIF) WRIST FRACTURE;  Surgeon: Kennedy Bucker, MD;  Location: [facility];  Service: Orthopedics;  Laterality: Right;   POLYPECTOMY N/A 01/22/2019   Procedure: POLYPECTOMY;  Surgeon: Midge Minium, MD;  Location: [facility];  Service: Endoscopy;  Laterality: N/A;  2 Clips placed at polyp removal site in ascending colon    POLYPECTOMY N/A 06/27/2020   Procedure: POLYPECTOMY;  Surgeon: Midge Minium, MD;  Location: [facility];  Service: Endoscopy;  Laterality: N/A;   TUBAL LIGATION     UPPER GASTROINTESTINAL ENDOSCOPY      Home Medications:  Allergies as of 01/11/2023       Reactions   Codeine    "felt drunk, crazy"   Hydrocodone Other (See Comments)   Hydrocodone-acetaminophen Other (See Comments)   Confusion/delirium   Lidocaine    Heart racing, increased BP - from local at dentist (likely epi)   Nsaids Other (See Comments)   Avoids due to IBS symptoms (bleeding symptoms)   Oxycodone    Confusion/delirium        Medication List        Accurate as of January 11, 2023 10:00 PM. If you have any questions, ask your nurse or doctor.          STOP taking these medications    celecoxib 200 MG capsule Commonly known as: CELEBREX Stopped by: Carman Ching, PA-C   diltiazem 30 MG tablet Commonly known as: CARDIZEM Stopped by: Carman Ching, PA-C   mirabegron ER 50 MG Tb24 tablet Commonly known as: MYRBETRIQ Stopped by: Carman Ching, PA-C   venlafaxine XR 37.5 MG 24 hr capsule Commonly known as: EFFEXOR-XR Stopped by: Carman Ching, PA-C       TAKE these medications    acetaminophen 500 MG tablet Commonly known as: TYLENOL Take 1,000 mg by mouth every 6 (six) hours as needed.   BRAINSTRONG MEMORY SUPPORT PO Take 2 capsules by mouth 3 (three) times a week. [employer] - Memory & Brain Health   CALCIUM 600+D3 PO Take 1 tablet by mouth in the morning.   Cholecalciferol 50 MCG (2000 UT) Caps Take 2,000 Units by mouth in the morning.   COQ-10 PO Take by mouth.   CRANBERRY-VITAMIN C PO Take 1 tablet by mouth 3 (three) times a week.   diazepam 5 MG tablet Commonly known as: VALIUM Take 1 tablet (5 mg total) by mouth daily as needed for anxiety. What changed: when to take this   ferrous gluconate 240 (27 FE) MG  tablet Commonly known as: FERGON Take 240 mg by mouth in the morning.   fluticasone 50 MCG/ACT nasal spray Commonly known as: FLONASE Place 1 spray into both nostrils daily as needed for allergies or rhinitis.   Gemtesa 75 MG Tabs Generic drug: Vibegron Take 1 tablet (75 mg total) by mouth daily. Started by: Carman Ching, PA-C   ipratropium 0.03 % nasal spray Commonly known as: ATROVENT   ipratropium 0.06 % nasal spray Commonly known as: ATROVENT Place into the nose.   loperamide 2 MG capsule Commonly known as: IMODIUM Take 2 mg by mouth every morning.   MELATONIN PO Take by mouth.   omeprazole 40 MG capsule Commonly known as: PRILOSEC Take 40  mg by mouth daily before breakfast.   PRESERVISION AREDS 2 PO Take 2 tablets by mouth in the morning.   Prolia 60 MG/ML Sosy injection Generic drug: denosumab Inject 60 mg into the skin every 6 (six) months.   simvastatin 40 MG tablet Commonly known as: ZOCOR Take 40 mg by mouth at bedtime.   STRESS B COMPLEX PO Take 1 capsule by mouth in the morning.   traMADol 50 MG tablet Commonly known as: ULTRAM Take 1-2 tablets (50-100 mg total) by mouth every 6 (six) hours as needed for moderate pain or severe pain.   vitamin C 1000 MG tablet Take 1,000 mg by mouth in the morning.        Allergies:  Allergies  Allergen Reactions   Codeine     "felt drunk, crazy"   Hydrocodone Other (See Comments)   Hydrocodone-Acetaminophen Other (See Comments)    Confusion/delirium   Lidocaine     Heart racing, increased BP - from local at dentist (likely epi)   Nsaids Other (See Comments)    Avoids due to IBS symptoms (bleeding symptoms)   Oxycodone     Confusion/delirium    Family History: Family History  Problem Relation Age of Onset   Diabetes Mother    Heart disease Mother    Hypertension Mother    Diabetes Son    Diabetes Sister    Inflammatory bowel disease Father        diverticulitis   Multiple myeloma  Father    Hypertension Son    Diabetes Maternal Grandmother    Colon cancer Neg Hx    Esophageal cancer Neg Hx    Rectal cancer Neg Hx    Stomach cancer Neg Hx    Breast cancer Neg Hx     Social History:   reports that she has been smoking cigarettes. She has a 27.50 pack-year smoking history. She has never used smokeless tobacco. She reports current alcohol use of about 2.0 standard drinks of alcohol per week. She reports that she does not use drugs.  Physical Exam: BP 105/67   Pulse 91   Ht 5' (1.524 m)   Wt 99 lb 5 oz (45 kg)   BMI 19.40 kg/m   Constitutional:  Alert and oriented, no acute distress, nontoxic appearing HEENT: [place], AT Cardiovascular: No clubbing, cyanosis, or edema Respiratory: Normal respiratory effort, no increased work of breathing Skin: No rashes, bruises or suspicious lesions Neurologic: Grossly intact, no focal deficits, moving all 4 extremities Psychiatric: Normal mood and affect  Laboratory Data: Results for orders placed or performed in visit on 01/11/23  Microscopic Examination   Urine  Result Value Ref Range   WBC, UA 0-5 0 - 5 /hpf   RBC, Urine 3-10 (A) 0 - 2 /hpf   Epithelial Cells (non renal) 0-10 0 - 10 /hpf   Casts Present (A) None seen /lpf   Cast Type Hyaline casts N/A   Mucus, UA Present (A) Not Estab.   Bacteria, UA Moderate (A) None seen/Few  Urinalysis, Complete  Result Value Ref Range   Specific Gravity, UA >1.030 (H) 1.005 - 1.030   pH, UA 5.5 5.0 - 7.5   Color, UA Amber (A) Yellow   Appearance Ur Clear Clear   Leukocytes,UA Negative Negative   Protein,UA 2+ (A) Negative/Trace   Glucose, UA Negative Negative   Ketones, UA Negative Negative   RBC, UA 2+ (A) Negative   Bilirubin, UA Negative Negative   Urobilinogen, Ur 0.2 0.2 -  1.0 mg/dL   Nitrite, UA Negative Negative   Microscopic Examination See below:    Assessment & Plan:   1. Nocturia We again discussed that nocturia is the most challenging urinary symptom to  treat because it is often a manifestation of other medical problems and rarely a true urologic issue. I continue to strongly suspect undiagnosed OSA and continue to encourage a sleep study. We again discussed the role of untreated OSA in nocturia. I'm not sure if she remembers our prior conversations about this. I do think exhaustion could be playing a role in her memory issues today. I have sent Dr. Hyacinth Meeker a message encouraging a sleep study and encouraged her to bring this up with him at her next appointment as well.  In the meantime, will try nighttime Gemtesa. Samples provided today. I do not anticipate this will resolve the nocturia, but am hoping it can decrease it somewhat. May still consider desmopressin as an alternative, however I continue to have reservations about this due to the need for close sodium monitoring. - Urinalysis, Complete - Vibegron (GEMTESA) 75 MG TABS; Take 1 tablet (75 mg total) by mouth daily.  Dispense: 42 tablet; Refill: 0  Return in about 6 weeks (around 02/22/2023) for Symptom recheck with PVR.  Carman Ching, PA-C  [facility] [place] [address], [address] [place], [place] [address] [phone]

## 2023-01-13 ENCOUNTER — Encounter: Payer: PPO | Admitting: Physical Therapy

## 2023-01-17 DIAGNOSIS — H353221 Exudative age-related macular degeneration, left eye, with active choroidal neovascularization: Secondary | ICD-10-CM | POA: Diagnosis not present

## 2023-01-19 DIAGNOSIS — S72112S Displaced fracture of greater trochanter of left femur, sequela: Secondary | ICD-10-CM | POA: Diagnosis not present

## 2023-01-19 DIAGNOSIS — M5136 Other intervertebral disc degeneration, lumbar region: Secondary | ICD-10-CM | POA: Diagnosis not present

## 2023-01-19 DIAGNOSIS — M48062 Spinal stenosis, lumbar region with neurogenic claudication: Secondary | ICD-10-CM | POA: Diagnosis not present

## 2023-01-19 DIAGNOSIS — M5416 Radiculopathy, lumbar region: Secondary | ICD-10-CM | POA: Diagnosis not present

## 2023-01-25 ENCOUNTER — Telehealth: Payer: Self-pay | Admitting: Physician Assistant

## 2023-01-25 NOTE — Telephone Encounter (Signed)
Please call her back and reschedule the appointment she has scheduled with me later this week.  She has been on Gemtesa for barely 2 weeks, which may be too soon to see a difference on the medication.  Additionally, we have discussed numerous times in clinic that I strongly suspect her nocturia is secondary to undiagnosed sleep apnea and she needs to pursue a sleep study with her PCP.  I would like to see her back in clinic no sooner than 02/08/2023, unless she is having burning pain with urination or side effects of the Gemtesa such that she is unable to take it.

## 2023-01-28 ENCOUNTER — Ambulatory Visit: Payer: PPO | Admitting: Physician Assistant

## 2023-02-01 DIAGNOSIS — F330 Major depressive disorder, recurrent, mild: Secondary | ICD-10-CM | POA: Diagnosis not present

## 2023-02-01 DIAGNOSIS — R634 Abnormal weight loss: Secondary | ICD-10-CM | POA: Diagnosis not present

## 2023-02-01 DIAGNOSIS — D500 Iron deficiency anemia secondary to blood loss (chronic): Secondary | ICD-10-CM | POA: Diagnosis not present

## 2023-02-03 DIAGNOSIS — D2271 Melanocytic nevi of right lower limb, including hip: Secondary | ICD-10-CM | POA: Diagnosis not present

## 2023-02-03 DIAGNOSIS — L820 Inflamed seborrheic keratosis: Secondary | ICD-10-CM | POA: Diagnosis not present

## 2023-02-03 DIAGNOSIS — R208 Other disturbances of skin sensation: Secondary | ICD-10-CM | POA: Diagnosis not present

## 2023-02-03 DIAGNOSIS — D2262 Melanocytic nevi of left upper limb, including shoulder: Secondary | ICD-10-CM | POA: Diagnosis not present

## 2023-02-03 DIAGNOSIS — D2261 Melanocytic nevi of right upper limb, including shoulder: Secondary | ICD-10-CM | POA: Diagnosis not present

## 2023-02-03 DIAGNOSIS — D2272 Melanocytic nevi of left lower limb, including hip: Secondary | ICD-10-CM | POA: Diagnosis not present

## 2023-02-08 DIAGNOSIS — D500 Iron deficiency anemia secondary to blood loss (chronic): Secondary | ICD-10-CM | POA: Diagnosis not present

## 2023-02-08 DIAGNOSIS — I739 Peripheral vascular disease, unspecified: Secondary | ICD-10-CM | POA: Diagnosis not present

## 2023-02-08 DIAGNOSIS — E1151 Type 2 diabetes mellitus with diabetic peripheral angiopathy without gangrene: Secondary | ICD-10-CM | POA: Diagnosis not present

## 2023-02-15 ENCOUNTER — Encounter: Payer: PPO | Admitting: Physical Therapy

## 2023-02-22 ENCOUNTER — Encounter: Payer: Self-pay | Admitting: Physician Assistant

## 2023-02-22 ENCOUNTER — Ambulatory Visit: Payer: PPO | Admitting: Physician Assistant

## 2023-02-22 ENCOUNTER — Encounter: Payer: PPO | Admitting: Physical Therapy

## 2023-02-22 VITALS — BP 105/68 | HR 91

## 2023-02-22 DIAGNOSIS — R351 Nocturia: Secondary | ICD-10-CM

## 2023-02-22 DIAGNOSIS — R300 Dysuria: Secondary | ICD-10-CM

## 2023-02-22 LAB — BLADDER SCAN AMB NON-IMAGING

## 2023-02-22 NOTE — Progress Notes (Signed)
02/22/2023 11:12 AM   Yolanda Jimenez 1946/08/02 [id number]  CC: Chief Complaint  Patient presents with   symptom recheck   Dysuria   Other    nocturia   HPI: Yolanda Jimenez is a 77 y.o. female with PMH hematuria with benign cystoscopy in February 2023; left renal cyst; recurrent UTIs; LUTS including urgency, frequency, and weak stream; and nocturia who failed Myrbetriq 25 mg who presents today for symptom recheck on Gemtesa.   I have repeatedly recommended pursuing a sleep study due to suspicion for undiagnosed sleep apnea and explained that this could likely be the source of her nocturia.  This has not yet been done.  Today she reports slight improvement in nocturia on Gemtesa, now x 1-4.  Dr. Hyacinth Meeker mentioned sleep study, however she is not excited to pursue this.  PVR 0 mL.  PMH: Past Medical History:  Diagnosis Date   A-fib (HCC)    Aortic atherosclerosis (HCC)    Arthritis    back   Asthma    AVM (arteriovenous malformation)    Barrett esophagus    C. difficile diarrhea    Carotid stenosis    Complication of anesthesia    very hard to come out of anesthesia and pt states she gets very" loopy" after surgeries   COPD (chronic obstructive pulmonary disease) (HCC)    no inhalers   Depression    Diabetes mellitus without complication (HCC)    borderline   Diverticulosis    GERD (gastroesophageal reflux disease)    Heart murmur    Hx of adenomatous colonic polyps    Hyperlipidemia    IBS (irritable bowel syndrome)    Insomnia    Iron deficiency anemia    Kidney cysts    hx UTIs   Macular degeneration    left eye   Microscopic hematuria    Osteoporosis    mild   PVD (peripheral vascular disease) (HCC)    Wears dentures    partial lower    Surgical History: Past Surgical History:  Procedure Laterality Date   BUNIONECTOMY     right foot   CATARACT EXTRACTION     both eyes   CERVICAL CONE BIOPSY     CHOLECYSTECTOMY     COLONOSCOPY     COLONOSCOPY  WITH PROPOFOL N/A 01/22/2019   Procedure: COLONOSCOPY WITH BIOPSY;  Surgeon: Midge Minium, MD;  Location: [facility];  Service: Endoscopy;  Laterality: N/A;  diabetic - diet controlled   COLONOSCOPY WITH PROPOFOL N/A 06/27/2020   Procedure: COLONOSCOPY WITH BIOPSY;  Surgeon: Midge Minium, MD;  Location: [facility];  Service: Endoscopy;  Laterality: N/A;  priority 4   ESOPHAGOGASTRODUODENOSCOPY (EGD) WITH PROPOFOL N/A 08/29/2017   Procedure: ESOPHAGOGASTRODUODENOSCOPY (EGD) WITH PROPOFOL;  Surgeon: Midge Minium, MD;  Location: [facility];  Service: Endoscopy;  Laterality: N/A;   FOOT SURGERY     right 2nd toe   HIP ARTHROPLASTY Left 05/16/2018   Procedure: ARTHROPLASTY BIPOLAR HIP (HEMIARTHROPLASTY);  Surgeon: Christena Flake, MD;  Location: [facility];  Service: Orthopedics;  Laterality: Left;   ORIF PERIPROSTHETIC FRACTURE Left 12/10/2021   Procedure: OPEN REDUCTION INTERNAL FIXATION OF A LEFT GREATER TROCHANTERIC FRACTURE;  Surgeon: Christena Flake, MD;  Location: [facility];  Service: Orthopedics;  Laterality: Left;   ORIF WRIST FRACTURE Right 10/07/2020   Procedure: OPEN REDUCTION INTERNAL FIXATION (ORIF) WRIST FRACTURE;  Surgeon: Kennedy Bucker, MD;  Location: [facility];  Service: Orthopedics;  Laterality: Right;   POLYPECTOMY  N/A 01/22/2019   Procedure: POLYPECTOMY;  Surgeon: Midge Minium, MD;  Location: [facility];  Service: Endoscopy;  Laterality: N/A;  2 Clips placed at polyp removal site in ascending colon   POLYPECTOMY N/A 06/27/2020   Procedure: POLYPECTOMY;  Surgeon: Midge Minium, MD;  Location: [facility];  Service: Endoscopy;  Laterality: N/A;   TUBAL LIGATION     UPPER GASTROINTESTINAL ENDOSCOPY      Home Medications:  Allergies as of 02/22/2023       Reactions   Codeine    "felt drunk, crazy"   Hydrocodone Other (See Comments)   Hydrocodone-acetaminophen Other (See Comments)   Confusion/delirium   Lidocaine    Heart racing, increased BP -  from local at dentist (likely epi)   Nsaids Other (See Comments)   Avoids due to IBS symptoms (bleeding symptoms)   Oxycodone    Confusion/delirium        Medication List        Accurate as of February 22, 2023 11:12 AM. If you have any questions, ask your nurse or doctor.          acetaminophen 500 MG tablet Commonly known as: TYLENOL Take 1,000 mg by mouth every 6 (six) hours as needed.   BRAINSTRONG MEMORY SUPPORT PO Take 2 capsules by mouth 3 (three) times a week. [employer] - Memory & Brain Health   CALCIUM 600+D3 PO Take 1 tablet by mouth in the morning.   Cholecalciferol 50 MCG (2000 UT) Caps Take 2,000 Units by mouth in the morning.   COQ-10 PO Take by mouth.   CRANBERRY-VITAMIN C PO Take 1 tablet by mouth 3 (three) times a week.   diazepam 5 MG tablet Commonly known as: VALIUM Take 1 tablet (5 mg total) by mouth daily as needed for anxiety. What changed: when to take this   ferrous gluconate 240 (27 FE) MG tablet Commonly known as: FERGON Take 240 mg by mouth in the morning.   fluticasone 50 MCG/ACT nasal spray Commonly known as: FLONASE Place 1 spray into both nostrils daily as needed for allergies or rhinitis.   Gemtesa 75 MG Tabs Generic drug: Vibegron Take 1 tablet (75 mg total) by mouth daily.   ipratropium 0.03 % nasal spray Commonly known as: ATROVENT   ipratropium 0.06 % nasal spray Commonly known as: ATROVENT Place into the nose.   loperamide 2 MG capsule Commonly known as: IMODIUM Take 2 mg by mouth every morning.   MELATONIN PO Take by mouth.   omeprazole 40 MG capsule Commonly known as: PRILOSEC Take 40 mg by mouth daily before breakfast.   PRESERVISION AREDS 2 PO Take 2 tablets by mouth in the morning.   Prolia 60 MG/ML Sosy injection Generic drug: denosumab Inject 60 mg into the skin every 6 (six) months.   simvastatin 40 MG tablet Commonly known as: ZOCOR Take 40 mg by mouth at bedtime.   STRESS B  COMPLEX PO Take 1 capsule by mouth in the morning.   traMADol 50 MG tablet Commonly known as: ULTRAM Take 1-2 tablets (50-100 mg total) by mouth every 6 (six) hours as needed for moderate pain or severe pain.   vitamin C 1000 MG tablet Take 1,000 mg by mouth in the morning.        Allergies:  Allergies  Allergen Reactions   Codeine     "felt drunk, crazy"   Hydrocodone Other (See Comments)   Hydrocodone-Acetaminophen Other (See Comments)    Confusion/delirium  Lidocaine     Heart racing, increased BP - from local at dentist (likely epi)   Nsaids Other (See Comments)    Avoids due to IBS symptoms (bleeding symptoms)   Oxycodone     Confusion/delirium    Family History: Family History  Problem Relation Age of Onset   Diabetes Mother    Heart disease Mother    Hypertension Mother    Diabetes Son    Diabetes Sister    Inflammatory bowel disease Father        diverticulitis   Multiple myeloma Father    Hypertension Son    Diabetes Maternal Grandmother    Colon cancer Neg Hx    Esophageal cancer Neg Hx    Rectal cancer Neg Hx    Stomach cancer Neg Hx    Breast cancer Neg Hx     Social History:   reports that she has been smoking cigarettes. She has a 27.5 pack-year smoking history. She has never used smokeless tobacco. She reports current alcohol use of about 2.0 standard drinks of alcohol per week. She reports that she does not use drugs.  Physical Exam: There were no vitals taken for this visit.  Constitutional:  Alert and oriented, no acute distress, nontoxic appearing HEENT: [place], AT Cardiovascular: No clubbing, cyanosis, or edema Respiratory: Normal respiratory effort, no increased work of breathing Skin: No rashes, bruises or suspicious lesions Neurologic: Grossly intact, no focal deficits, moving all 4 extremities Psychiatric: Normal mood and affect  Laboratory Data: Results for orders placed or performed in visit on 02/22/23  Bladder Scan (Post Void  Residual) in office  Result Value Ref Range   Scan Result 0ml    Assessment & Plan:   1. Nocturia Light improvement on Gemtesa, but still abnormal.  We agreed to continue Gemtesa to see if she will get additional therapeutic benefit over time.  I gave her an additional 6 weeks of samples today.  Will have her follow-up upon completion to discuss status.  At that point, may consider desmopressin, but still primarily agree with sleep study. - Bladder Scan (Post Void Residual) in office   Return in about 6 weeks (around 04/05/2023) for Symptom recheck with UA, PVR.  Carman Ching, PA-C  [facility] [place] [address] [place], [place] [address] [phone]

## 2023-02-24 ENCOUNTER — Encounter: Payer: PPO | Admitting: Physical Therapy

## 2023-03-01 ENCOUNTER — Ambulatory Visit (INDEPENDENT_AMBULATORY_CARE_PROVIDER_SITE_OTHER): Payer: PPO | Admitting: Vascular Surgery

## 2023-03-07 DIAGNOSIS — H6123 Impacted cerumen, bilateral: Secondary | ICD-10-CM | POA: Diagnosis not present

## 2023-03-07 DIAGNOSIS — H9313 Tinnitus, bilateral: Secondary | ICD-10-CM | POA: Diagnosis not present

## 2023-03-07 DIAGNOSIS — R42 Dizziness and giddiness: Secondary | ICD-10-CM | POA: Diagnosis not present

## 2023-03-07 DIAGNOSIS — H6063 Unspecified chronic otitis externa, bilateral: Secondary | ICD-10-CM | POA: Diagnosis not present

## 2023-03-08 ENCOUNTER — Encounter (INDEPENDENT_AMBULATORY_CARE_PROVIDER_SITE_OTHER): Payer: Self-pay | Admitting: Nurse Practitioner

## 2023-03-08 ENCOUNTER — Ambulatory Visit (INDEPENDENT_AMBULATORY_CARE_PROVIDER_SITE_OTHER): Payer: PPO | Admitting: Nurse Practitioner

## 2023-03-08 VITALS — BP 113/66 | HR 86 | Resp 16 | Wt 100.8 lb

## 2023-03-08 DIAGNOSIS — E1151 Type 2 diabetes mellitus with diabetic peripheral angiopathy without gangrene: Secondary | ICD-10-CM | POA: Diagnosis not present

## 2023-03-08 DIAGNOSIS — I70212 Atherosclerosis of native arteries of extremities with intermittent claudication, left leg: Secondary | ICD-10-CM | POA: Diagnosis not present

## 2023-03-08 DIAGNOSIS — I6523 Occlusion and stenosis of bilateral carotid arteries: Secondary | ICD-10-CM

## 2023-03-08 NOTE — Progress Notes (Signed)
Subjective:    Patient ID: Yolanda Jimenez, female    DOB: [birth year], 77 y.o.   MRN: [id number] Chief Complaint  Patient presents with   Follow-up    Ref Hyacinth Meeker consult progressive LLE claudication with known PVD    Yolanda Jimenez is a 77 year old female who returns today for evaluation of pain in her left lower extremity.  The patient has had known peripheral arterial disease however she has not had significant issues or symptoms until recently.  Since last May she began to have some significant pain in her left leg starting from her hip radiating down her leg.  Initially it was felt that this may be due to her hip or back.  She had injections but these did not relieve her issues.  This pain was severe for the patient.  She notes at rest she did not have any issues but as soon as she began to get up and walk she again has severe tightness and cramping which relieved itself once she was able to rest or sit.  She notes this pain is significant.  She notes that with walking and movement it has improved somewhat but she still has difficulty with doing everyday activities such as being able to grocery shop.  She denies any development of open wounds or ulcerations.  She also denies the development of rest pain.  The patient underwent ABIs in March 2024 which showed an ABI of 1.05 on the right and 0.71 on the left.  She had normal TBI's on the right but diminished TBI's of 0.24 on the left  She has triphasic tibial artery waveforms on the right with monophasic/biphasic waveforms on the left and dampened toe waveforms on the left as well.    Review of Systems     Objective:   Physical Exam  BP 113/66 (BP Location: Left Arm)   Pulse 86   Resp 16   Wt 100 lb 12.8 oz (45.7 kg)   BMI 19.69 kg/m   Past Medical History:  Diagnosis Date   A-fib (HCC)    Aortic atherosclerosis (HCC)    Arthritis    back   Asthma    AVM (arteriovenous malformation)    Barrett esophagus    C. difficile diarrhea     Carotid stenosis    Complication of anesthesia    very hard to come out of anesthesia and pt states she gets very" loopy" after surgeries   COPD (chronic obstructive pulmonary disease) (HCC)    no inhalers   Depression    Diabetes mellitus without complication (HCC)    borderline   Diverticulosis    GERD (gastroesophageal reflux disease)    Heart murmur    Hx of adenomatous colonic polyps    Hyperlipidemia    IBS (irritable bowel syndrome)    Insomnia    Iron deficiency anemia    Kidney cysts    hx UTIs   Macular degeneration    left eye   Microscopic hematuria    Osteoporosis    mild   PVD (peripheral vascular disease) (HCC)    Wears dentures    partial lower    Social History   Socioeconomic History   Marital status: Single    Spouse name: Not on file   Number of children: 2   Years of education: Not on file   Highest education level: Not on file  Occupational History   Occupation: Retired  Tobacco Use   Smoking status: Every Day  Current packs/day: 0.50    Average packs/day: 0.5 packs/day for 55.0 years (27.5 ttl pk-yrs)    Types: Cigarettes   Smokeless tobacco: Never   Tobacco comments:    she has patches to start just not started yet. smoked since teenager.  Vaping Use   Vaping status: Never Used  Substance and Sexual Activity   Alcohol use: Yes    Alcohol/week: 2.0 standard drinks of alcohol    Types: 2 Glasses of wine per week    Comment: occ wine 1-2 weekly   Drug use: No   Sexual activity: Not on file  Other Topics Concern   Not on file  Social History Narrative   Independent at baseline.  Lives at home with her husband   Social Determinants of Health   Financial Resource Strain: Low Risk  (11/09/2022)   Received from [facility], [facility]   Overall Financial Resource Strain (CARDIA)    Difficulty of Paying Living Expenses: Not hard at all  Food Insecurity: No Food Insecurity (12/23/2022)   Hunger  Vital Sign    Worried About Running Out of Food in the Last Year: Never true    Ran Out of Food in the Last Year: Never true  Transportation Needs: No Transportation Needs (12/23/2022)   PRAPARE - Administrator, Civil Service (Medical): No    Lack of Transportation (Non-Medical): No  Physical Activity: Not on file  Stress: Not on file  Social Connections: Not on file  Intimate Partner Violence: Not At Risk (12/23/2022)   Humiliation, Afraid, Rape, and Kick questionnaire    Fear of Current or Ex-Partner: No    Emotionally Abused: No    Physically Abused: No    Sexually Abused: No    Past Surgical History:  Procedure Laterality Date   BUNIONECTOMY     right foot   CATARACT EXTRACTION     both eyes   CERVICAL CONE BIOPSY     CHOLECYSTECTOMY     COLONOSCOPY     COLONOSCOPY WITH PROPOFOL N/A 01/22/2019   Procedure: COLONOSCOPY WITH BIOPSY;  Surgeon: Midge Minium, MD;  Location: [facility];  Service: Endoscopy;  Laterality: N/A;  diabetic - diet controlled   COLONOSCOPY WITH PROPOFOL N/A 06/27/2020   Procedure: COLONOSCOPY WITH BIOPSY;  Surgeon: Midge Minium, MD;  Location: [facility];  Service: Endoscopy;  Laterality: N/A;  priority 4   ESOPHAGOGASTRODUODENOSCOPY (EGD) WITH PROPOFOL N/A 08/29/2017   Procedure: ESOPHAGOGASTRODUODENOSCOPY (EGD) WITH PROPOFOL;  Surgeon: Midge Minium, MD;  Location: [facility];  Service: Endoscopy;  Laterality: N/A;   FOOT SURGERY     right 2nd toe   HIP ARTHROPLASTY Left 05/16/2018   Procedure: ARTHROPLASTY BIPOLAR HIP (HEMIARTHROPLASTY);  Surgeon: Christena Flake, MD;  Location: [facility] OR[facility];  Service: Orthopedics;  Laterality: Left;   ORIF PERIPROSTHETIC FRACTURE Left 12/10/2021   Procedure: OPEN REDUCTION INTERNAL FIXATION OF A LEFT GREATER TROCHANTERIC FRACTURE;  Surgeon: Christena Flake, MD;  Location: [facility] OR[facility];  Service: Orthopedics;  Laterality: Left;   ORIF WRIST FRACTURE Right 10/07/2020   Procedure: OPEN REDUCTION  INTERNAL FIXATION (ORIF) WRIST FRACTURE;  Surgeon: Kennedy Bucker, MD;  Location: [facility];  Service: Orthopedics;  Laterality: Right;   POLYPECTOMY N/A 01/22/2019   Procedure: POLYPECTOMY;  Surgeon: Midge Minium, MD;  Location: [facility];  Service: Endoscopy;  Laterality: N/A;  2 Clips placed at polyp removal site in ascending colon   POLYPECTOMY N/A 06/27/2020   Procedure: POLYPECTOMY;  Surgeon: Midge Minium, MD;  Location: [facility];  Service: Endoscopy;  Laterality: N/A;   TUBAL LIGATION     UPPER GASTROINTESTINAL ENDOSCOPY      Family History  Problem Relation Age of Onset   Diabetes Mother    Heart disease Mother    Hypertension Mother    Diabetes Son    Diabetes Sister    Inflammatory bowel disease Father        diverticulitis   Multiple myeloma Father    Hypertension Son    Diabetes Maternal Grandmother    Colon cancer Neg Hx    Esophageal cancer Neg Hx    Rectal cancer Neg Hx    Stomach cancer Neg Hx    Breast cancer Neg Hx     Allergies  Allergen Reactions   Codeine     "felt drunk, crazy"   Hydrocodone Other (See Comments)   Hydrocodone-Acetaminophen Other (See Comments)    Confusion/delirium   Lidocaine     Heart racing, increased BP - from local at dentist (likely epi)   Nsaids Other (See Comments)    Avoids due to IBS symptoms (bleeding symptoms)   Oxycodone     Confusion/delirium       Latest Ref Rng & Units 12/12/2021   11:50 PM 12/12/2021    4:10 AM 12/11/2021    6:15 AM  CBC  WBC 4.0 - 10.5 K/uL 7.8  8.1  8.8   Hemoglobin 12.0 - 15.0 g/dL 9.2  9.2  9.8   Hematocrit 36.0 - 46.0 % 28.3  27.8  30.5   Platelets 150 - 400 K/uL 317  287  307       CMP     Component Value Date/Time   NA 136 12/12/2021 2350   NA 142 07/12/2014 2035   K 3.7 12/12/2021 2350   K 3.4 (L) 07/12/2014 2035   CL 104 12/12/2021 2350   CL 107 07/12/2014 2035   CO2 26 12/12/2021 2350   CO2 30 07/12/2014 2035   GLUCOSE 123 (H) 12/12/2021 2350   GLUCOSE  133 (H) 07/12/2014 2035   BUN 11 12/12/2021 2350   BUN 11 07/12/2014 2035   CREATININE 0.67 12/12/2021 2350   CREATININE 0.73 07/12/2014 2035   CALCIUM 8.3 (L) 12/12/2021 2350   CALCIUM 8.3 (L) 07/12/2014 2035   PROT 7.1 05/15/2018 1415   ALBUMIN 4.2 05/15/2018 1415   AST 25 05/15/2018 1415   ALT 16 05/15/2018 1415   ALKPHOS 62 05/15/2018 1415   BILITOT 0.6 05/15/2018 1415   GFRNONAA >60 12/12/2021 2350   GFRNONAA >60 07/12/2014 2035     No results found.     Assessment & Plan:   1. 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  Given the severity of the patient's severe left lower extremity symptoms the patient should undergo angiography with the hope for intervention.  Risk and benefits were reviewed the patient.  Indications for the procedure were reviewed.  All questions were answered, the patient agrees to proceed with left lower extremity angiography and possible intervention.   The patient should continue walking and begin a more formal exercise program.  The patient should continue antiplatelet therapy and aggressive treatment of the lipid abnormalities  The patient will follow up with me after the angiogram.   2. 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  Hgb A1C to be monitored as already arranged by primary service  3. Bilateral carotid artery stenosis Carotid duplex in March showed 1 to 39% stenosis bilaterally.  Will continue to evaluate this annually.   Current Outpatient Medications on File Prior to Visit  Medication Sig Dispense Refill   acetaminophen (TYLENOL) 500 MG tablet Take 1,000 mg by mouth every 6 (six) hours as needed.      Ascorbic Acid (VITAMIN C) 1000 MG tablet Take 1,000 mg by mouth in the morning.     B Complex-C-Folic Acid (STRESS B COMPLEX PO) Take 1 capsule by mouth in the morning.     Coenzyme Q10 (COQ-10 PO) Take by mouth.     CRANBERRY-VITAMIN C PO Take 1 tablet by mouth 3 (three) times a week.     denosumab (PROLIA) 60 MG/ML SOSY injection Inject 60 mg into the skin every 6 (six) months.     diazepam (VALIUM) 5 MG tablet Take 1 tablet (5 mg total) by mouth daily as needed for anxiety. (Patient taking differently: Take 5 mg by mouth 2 (two) times daily as needed for anxiety.) 10 tablet 0   ferrous gluconate (FERGON) 240 (27 FE) MG tablet Take 240 mg by mouth in the morning.     fluticasone (FLONASE) 50 MCG/ACT nasal spray Place 1 spray into both nostrils daily as needed for allergies or rhinitis.     loperamide (IMODIUM) 2 MG capsule Take 2 mg by mouth every morning.     MELATONIN PO Take by mouth.     Misc Natural Products (BRAINSTRONG MEMORY SUPPORT PO) Take 2 capsules by mouth 3 (three) times a week. [employer] - Memory & Brain Health     Multiple Vitamins-Minerals (PRESERVISION AREDS 2 PO) Take 2 tablets by mouth in the morning.     omeprazole (PRILOSEC) 40 MG capsule Take 40 mg by mouth daily before breakfast.     simvastatin (ZOCOR) 40 MG tablet Take 40 mg by mouth at bedtime.     Vibegron (GEMTESA) 75 MG TABS Take 1 tablet (75 mg total) by mouth daily. 42 tablet 0   No current facility-administered medications on file prior to visit.    There are no Patient Instructions on file for this visit. No follow-ups on file.   Georgiana Spinner, NP

## 2023-03-09 ENCOUNTER — Telehealth (INDEPENDENT_AMBULATORY_CARE_PROVIDER_SITE_OTHER): Payer: Self-pay

## 2023-03-09 NOTE — Telephone Encounter (Signed)
I attempted to contact the patient to schedule a LLE angio with Dr. Lucky Cowboy. A message was left for a return call.

## 2023-03-21 ENCOUNTER — Other Ambulatory Visit (INDEPENDENT_AMBULATORY_CARE_PROVIDER_SITE_OTHER): Payer: Self-pay | Admitting: Nurse Practitioner

## 2023-03-21 DIAGNOSIS — I70212 Atherosclerosis of native arteries of extremities with intermittent claudication, left leg: Secondary | ICD-10-CM

## 2023-03-23 ENCOUNTER — Ambulatory Visit (INDEPENDENT_AMBULATORY_CARE_PROVIDER_SITE_OTHER): Payer: PPO

## 2023-03-23 DIAGNOSIS — I70212 Atherosclerosis of native arteries of extremities with intermittent claudication, left leg: Secondary | ICD-10-CM

## 2023-03-25 ENCOUNTER — Inpatient Hospital Stay (HOSPITAL_BASED_OUTPATIENT_CLINIC_OR_DEPARTMENT_OTHER): Payer: PPO | Admitting: Internal Medicine

## 2023-03-25 ENCOUNTER — Encounter: Payer: Self-pay | Admitting: Internal Medicine

## 2023-03-25 ENCOUNTER — Inpatient Hospital Stay: Payer: PPO | Attending: Internal Medicine

## 2023-03-25 VITALS — BP 124/71 | HR 65 | Resp 18 | Ht 60.0 in | Wt 101.0 lb

## 2023-03-25 DIAGNOSIS — M79605 Pain in left leg: Secondary | ICD-10-CM | POA: Insufficient documentation

## 2023-03-25 DIAGNOSIS — R3129 Other microscopic hematuria: Secondary | ICD-10-CM | POA: Diagnosis not present

## 2023-03-25 DIAGNOSIS — F17210 Nicotine dependence, cigarettes, uncomplicated: Secondary | ICD-10-CM | POA: Diagnosis not present

## 2023-03-25 DIAGNOSIS — R351 Nocturia: Secondary | ICD-10-CM | POA: Insufficient documentation

## 2023-03-25 DIAGNOSIS — Z807 Family history of other malignant neoplasms of lymphoid, hematopoietic and related tissues: Secondary | ICD-10-CM | POA: Insufficient documentation

## 2023-03-25 DIAGNOSIS — F32A Depression, unspecified: Secondary | ICD-10-CM | POA: Diagnosis not present

## 2023-03-25 DIAGNOSIS — D509 Iron deficiency anemia, unspecified: Secondary | ICD-10-CM

## 2023-03-25 LAB — CBC WITH DIFFERENTIAL/PLATELET
Abs Immature Granulocytes: 0.02 K/uL (ref 0.00–0.07)
Basophils Absolute: 0.1 K/uL (ref 0.0–0.1)
Basophils Relative: 1 %
Eosinophils Absolute: 0.1 K/uL (ref 0.0–0.5)
Eosinophils Relative: 2 %
HCT: 35.5 % — ABNORMAL LOW (ref 36.0–46.0)
Hemoglobin: 10.7 g/dL — ABNORMAL LOW (ref 12.0–15.0)
Immature Granulocytes: 0 %
Lymphocytes Relative: 11 %
Lymphs Abs: 0.8 K/uL (ref 0.7–4.0)
MCH: 26.7 pg (ref 26.0–34.0)
MCHC: 30.1 g/dL (ref 30.0–36.0)
MCV: 88.5 fL (ref 80.0–100.0)
Monocytes Absolute: 0.7 K/uL (ref 0.1–1.0)
Monocytes Relative: 9 %
Neutro Abs: 6 K/uL (ref 1.7–7.7)
Neutrophils Relative %: 77 %
Platelets: 435 K/uL — ABNORMAL HIGH (ref 150–400)
RBC: 4.01 MIL/uL (ref 3.87–5.11)
RDW: 19.1 % — ABNORMAL HIGH (ref 11.5–15.5)
WBC: 7.7 K/uL (ref 4.0–10.5)
nRBC: 0 % (ref 0.0–0.2)

## 2023-03-25 LAB — FERRITIN: Ferritin: 18 ng/mL (ref 11–307)

## 2023-03-25 LAB — IRON AND TIBC
Iron: 18 ug/dL — ABNORMAL LOW (ref 28–170)
Saturation Ratios: 4 % — ABNORMAL LOW (ref 10.4–31.8)
TIBC: 412 ug/dL (ref 250–450)
UIBC: 394 ug/dL

## 2023-03-25 NOTE — H&P (View-Only) (Signed)
 [facility]  [phone] [phone]  ID: Yolanda Jimenez OB: [birth year]  MR#: [id number]  WUX#:[id number]  Patient Care Team: Danella Penton, MD as PCP - General (Unknown Physician Specialty) Michaelyn Barter, MD as Consulting Physician (Oncology)   HPI: Yolanda Jimenez is a 77 y.o. female with past medical history of A-fib, arthritis, asthma, AVM, Barrett's esophagus, carotid stenosis, COPD, diabetes, GERD, IBS, PVD referred to hematology for management of iron deficiency anemia.  Patient reports longstanding history of iron deficiency anemia.  She has been on for count for many years.  Recently has been going through depression due to multiple difficult events happening in her life.  As a result she stopped her iron supplements.  She went back on it 3 to 4 days ago. Reports with her history of IBS sometimes she has voluminous and mushy stools.  After frequent wiping can notice some blood on the tissue paper at the end.   Labs reviewed from 12/15/2022.  Hemoglobin 9.9, WBC 12.6, MCV 80 and platelets 492.  Ferritin 6.  B12 374.  CBC from 11/02/2022 WBC 11.6, hemoglobin 10.4 and platelets 464.  Colonoscopy from 06/27/2020 by Dr. Servando Snare showed multiple polyps and diverticulosis.  Pathology showed tubular adenoma x 5 and hyperplastic polyp x 1.  UA showed 1+ protein and 2+ blood.  RBC 13.  Follows with Dr. Apolinar Junes of urology.  Has nocturia and was started on Mybetriq.  She has also stopped her Effexor.  Chronic left lower extremity pain-started after her hip surgery a year ago.  Is following with orthopedics for cortisone injection.  Reports not helping much.  Interval history Patient was seen today as follow-up for iron deficiency anemia and labs. She has been taking oral iron more regularly.  Denies any side effects.  Her left leg pain is doing better than the last visit.  She is scheduled to see Dr. Wyn Quaker.  She is also scheduled appointment with GI.  Denies any  bleeding in urine or stool.  Does report mucousy discharge and bowel movement which is new.  REVIEW OF SYSTEMS:   ROS  As per HPI. Otherwise, a complete review of systems is negative.  PAST MEDICAL HISTORY: Past Medical History:  Diagnosis Date   A-fib (HCC)    Aortic atherosclerosis (HCC)    Arthritis    back   Asthma    AVM (arteriovenous malformation)    Barrett esophagus    C. difficile diarrhea    Carotid stenosis    Complication of anesthesia    very hard to come out of anesthesia and pt states she gets very" loopy" after surgeries   COPD (chronic obstructive pulmonary disease) (HCC)    no inhalers   Depression    Diabetes mellitus without complication (HCC)    borderline   Diverticulosis    GERD (gastroesophageal reflux disease)    Heart murmur    Hx of adenomatous colonic polyps    Hyperlipidemia    IBS (irritable bowel syndrome)    Insomnia    Iron deficiency anemia    Kidney cysts    hx UTIs   Macular degeneration    left eye   Microscopic hematuria    Osteoporosis    mild   PVD (peripheral vascular disease) (HCC)    Wears dentures    partial lower    PAST SURGICAL HISTORY: Past Surgical History:  Procedure Laterality Date   BUNIONECTOMY     right foot   CATARACT EXTRACTION  both eyes   CERVICAL CONE BIOPSY     CHOLECYSTECTOMY     COLONOSCOPY     COLONOSCOPY WITH PROPOFOL N/A 01/22/2019   Procedure: COLONOSCOPY WITH BIOPSY;  Surgeon: Midge Minium, MD;  Location: [facility];  Service: Endoscopy;  Laterality: N/A;  diabetic - diet controlled   COLONOSCOPY WITH PROPOFOL N/A 06/27/2020   Procedure: COLONOSCOPY WITH BIOPSY;  Surgeon: Midge Minium, MD;  Location: [facility];  Service: Endoscopy;  Laterality: N/A;  priority 4   ESOPHAGOGASTRODUODENOSCOPY (EGD) WITH PROPOFOL N/A 08/29/2017   Procedure: ESOPHAGOGASTRODUODENOSCOPY (EGD) WITH PROPOFOL;  Surgeon: Midge Minium, MD;  Location: [facility];  Service: Endoscopy;   Laterality: N/A;   FOOT SURGERY     right 2nd toe   HIP ARTHROPLASTY Left 05/16/2018   Procedure: ARTHROPLASTY BIPOLAR HIP (HEMIARTHROPLASTY);  Surgeon: Christena Flake, MD;  Location: [facility];  Service: Orthopedics;  Laterality: Left;   ORIF PERIPROSTHETIC FRACTURE Left 12/10/2021   Procedure: OPEN REDUCTION INTERNAL FIXATION OF A LEFT GREATER TROCHANTERIC FRACTURE;  Surgeon: Christena Flake, MD;  Location: [facility] OR[facility];  Service: Orthopedics;  Laterality: Left;   ORIF WRIST FRACTURE Right 10/07/2020   Procedure: OPEN REDUCTION INTERNAL FIXATION (ORIF) WRIST FRACTURE;  Surgeon: Kennedy Bucker, MD;  Location: [facility];  Service: Orthopedics;  Laterality: Right;   POLYPECTOMY N/A 01/22/2019   Procedure: POLYPECTOMY;  Surgeon: Midge Minium, MD;  Location: [facility];  Service: Endoscopy;  Laterality: N/A;  2 Clips placed at polyp removal site in ascending colon   POLYPECTOMY N/A 06/27/2020   Procedure: POLYPECTOMY;  Surgeon: Midge Minium, MD;  Location: [facility];  Service: Endoscopy;  Laterality: N/A;   TUBAL LIGATION     UPPER GASTROINTESTINAL ENDOSCOPY      FAMILY HISTORY: Family History  Problem Relation Age of Onset   Diabetes Mother    Heart disease Mother    Hypertension Mother    Diabetes Son    Diabetes Sister    Inflammatory bowel disease Father        diverticulitis   Multiple myeloma Father    Hypertension Son    Diabetes Maternal Grandmother    Colon cancer Neg Hx    Esophageal cancer Neg Hx    Rectal cancer Neg Hx    Stomach cancer Neg Hx    Breast cancer Neg Hx     HEALTH MAINTENANCE: Social History   Tobacco Use   Smoking status: Every Day    Current packs/day: 0.50    Average packs/day: 0.5 packs/day for 55.0 years (27.5 ttl pk-yrs)    Types: Cigarettes   Smokeless tobacco: Never   Tobacco comments:    she has patches to start just not started yet. smoked since teenager.  Vaping Use   Vaping status: Never Used  Substance Use Topics   Alcohol use:  Yes    Alcohol/week: 2.0 standard drinks of alcohol    Types: 2 Glasses of wine per week    Comment: occ wine 1-2 weekly   Drug use: No     Allergies  Allergen Reactions   Codeine     "felt drunk, crazy"   Hydrocodone Other (See Comments)   Hydrocodone-Acetaminophen Other (See Comments)    Confusion/delirium   Lidocaine     Heart racing, increased BP - from local at dentist (likely epi)   Nsaids Other (See Comments)    Avoids due to IBS symptoms (bleeding symptoms)   Oxycodone     Confusion/delirium    Current  Outpatient Medications  Medication Sig Dispense Refill   acetaminophen (TYLENOL) 500 MG tablet Take 1,000 mg by mouth every 6 (six) hours as needed.     Ascorbic Acid (VITAMIN C) 1000 MG tablet Take 1,000 mg by mouth in the morning.     B Complex-C-Folic Acid (STRESS B COMPLEX PO) Take 1 capsule by mouth in the morning.     denosumab (PROLIA) 60 MG/ML SOSY injection Inject 60 mg into the skin every 6 (six) months.     diazepam (VALIUM) 5 MG tablet Take 1 tablet (5 mg total) by mouth daily as needed for anxiety. (Patient taking differently: Take 5 mg by mouth 2 (two) times daily as needed for anxiety.) 10 tablet 0   ferrous gluconate (FERGON) 240 (27 FE) MG tablet Take 240 mg by mouth in the morning.     fluticasone (FLONASE) 50 MCG/ACT nasal spray Place 1 spray into both nostrils daily as needed for allergies or rhinitis.     loperamide (IMODIUM) 2 MG capsule Take 2 mg by mouth every morning.     Misc Natural Products (BRAINSTRONG MEMORY SUPPORT PO) Take 2 capsules by mouth 3 (three) times a week. [employer] - Memory & Brain Health     Multiple Vitamins-Minerals (PRESERVISION AREDS 2 PO) Take 2 tablets by mouth in the morning.     omeprazole (PRILOSEC) 40 MG capsule Take 40 mg by mouth daily before breakfast.     simvastatin (ZOCOR) 40 MG tablet Take 40 mg by mouth at bedtime.     venlafaxine (EFFEXOR) 25 MG tablet Take 25 mg by mouth 2 (two) times daily.      Vibegron (GEMTESA) 75 MG TABS Take 1 tablet (75 mg total) by mouth daily. 42 tablet 0   No current facility-administered medications for this visit.    OBJECTIVE: Vitals:   03/25/23 1019  BP: 124/71  Pulse: 65  Resp: 18  SpO2: 99%     Body mass index is 19.73 kg/m.      General: Well-developed, well-nourished, no acute distress. Eyes: Pink conjunctiva, anicteric sclera. HEENT: Normocephalic, moist mucous membranes, clear oropharnyx. Lungs: Clear to auscultation bilaterally. Heart: Regular rate and rhythm. No rubs, murmurs, or gallops. Abdomen: Soft, nontender, nondistended. No organomegaly noted, normoactive bowel sounds. Musculoskeletal: No edema, cyanosis, or clubbing. Neuro: Alert, answering all questions appropriately. Cranial nerves grossly intact. Skin: No rashes or petechiae noted. Psych: Normal affect. Lymphatics: No cervical, calvicular, axillary or inguinal LAD.   LAB RESULTS:  Lab Results  Component Value Date   NA 136 12/12/2021   K 3.7 12/12/2021   CL 104 12/12/2021   CO2 26 12/12/2021   GLUCOSE 123 (H) 12/12/2021   BUN 11 12/12/2021   CREATININE 0.67 12/12/2021   CALCIUM 8.3 (L) 12/12/2021   PROT 7.1 05/15/2018   ALBUMIN 4.2 05/15/2018   AST 25 05/15/2018   ALT 16 05/15/2018   ALKPHOS 62 05/15/2018   BILITOT 0.6 05/15/2018   GFRNONAA >60 12/12/2021   GFRAA >60 05/18/2018    Lab Results  Component Value Date   WBC 7.7 03/25/2023   NEUTROABS 6.0 03/25/2023   HGB 10.7 (L) 03/25/2023   HCT 35.5 (L) 03/25/2023   MCV 88.5 03/25/2023   PLT 435 (H) 03/25/2023    Lab Results  Component Value Date   TIBC 412 03/25/2023   FERRITIN 18 03/25/2023   IRONPCTSAT 4 (L) 03/25/2023   IRONPCTSAT 26.7 05/18/2013   IRONPCTSAT 53.9 (H) 05/14/2011     STUDIES: No results  found.  ASSESSMENT AND PLAN:   Yolanda Jimenez is a 77 y.o. female with pmh of A-fib, arthritis, asthma, AVM, Barrett's esophagus, carotid stenosis, COPD, diabetes, GERD, IBS, PVD  referred to hematology for management of iron deficiency anemia.  # Iron deficiency anemia # Microscopic hematuria, chronic -Progressive.  From 12/13/2022-hemoglobin 9.2, ferritin 6.  Reports longstanding history of iron deficiency and is on Fergon 325 mg daily.    -On initial visit in May 2024, patient decided to opt for oral iron.  Repeat labs show only mild improvement in hemoglobin from 9.2-10.7.  Her iron level is pending but most likely it is going to be low.  Because her response to the oral iron is very slow, I have advised her IV Venofer 200 mg weekly x 5 doses which she is agreeable to.  Occasional side effects such as nausea and back pain was discussed.  Rarely allergic reactions can happen.  She is also scheduled to see Dr. Servando Snare GI in October. er last colonoscopy was in November 2021 with Dr. Servando Snare which showed multiple polyps and diverticulosis.  She was advised to continue with oral iron.  # Nocturia -Follows with Dr. Apolinar Junes.  She was started on Mybertiq.    # Depression -Advised to resume Effexor   # Leg pain -Reports improvement.  Follows with orthopedics.  Also scheduled to see Dr. Wyn Quaker next week.  Orders Placed This Encounter  Procedures   CBC with Differential/Platelet   Iron and TIBC   Ferritin   RTC in 4 months for MD visit, labs, possible Venofer  Patient expressed understanding and was in agreement with this plan. She also understands that She can call clinic at any time with any questions, concerns, or complaints.   I spent a total of 30 minutes reviewing chart data, face-to-face evaluation with the patient, counseling and coordination of care as detailed above.  Michaelyn Barter, MD   03/25/2023 11:13 AM

## 2023-03-25 NOTE — Progress Notes (Signed)
[facility]  [phone] [phone]  ID: Yolanda Jimenez OB: [birth year]  MR#: [id number]  WUX#:[id number]  Patient Care Team: Danella Penton, MD as PCP - General (Unknown Physician Specialty) Michaelyn Barter, MD as Consulting Physician (Oncology)   HPI: Yolanda Jimenez is a 77 y.o. female with past medical history of A-fib, arthritis, asthma, AVM, Barrett's esophagus, carotid stenosis, COPD, diabetes, GERD, IBS, PVD referred to hematology for management of iron deficiency anemia.  Patient reports longstanding history of iron deficiency anemia.  She has been on for count for many years.  Recently has been going through depression due to multiple difficult events happening in her life.  As a result she stopped her iron supplements.  She went back on it 3 to 4 days ago. Reports with her history of IBS sometimes she has voluminous and mushy stools.  After frequent wiping can notice some blood on the tissue paper at the end.   Labs reviewed from 12/15/2022.  Hemoglobin 9.9, WBC 12.6, MCV 80 and platelets 492.  Ferritin 6.  B12 374.  CBC from 11/02/2022 WBC 11.6, hemoglobin 10.4 and platelets 464.  Colonoscopy from 06/27/2020 by Dr. Servando Snare showed multiple polyps and diverticulosis.  Pathology showed tubular adenoma x 5 and hyperplastic polyp x 1.  UA showed 1+ protein and 2+ blood.  RBC 13.  Follows with Dr. Apolinar Junes of urology.  Has nocturia and was started on Mybetriq.  She has also stopped her Effexor.  Chronic left lower extremity pain-started after her hip surgery a year ago.  Is following with orthopedics for cortisone injection.  Reports not helping much.  Interval history Patient was seen today as follow-up for iron deficiency anemia and labs. She has been taking oral iron more regularly.  Denies any side effects.  Her left leg pain is doing better than the last visit.  She is scheduled to see Dr. Wyn Quaker.  She is also scheduled appointment with GI.  Denies any  bleeding in urine or stool.  Does report mucousy discharge and bowel movement which is new.  REVIEW OF SYSTEMS:   ROS  As per HPI. Otherwise, a complete review of systems is negative.  PAST MEDICAL HISTORY: Past Medical History:  Diagnosis Date   A-fib (HCC)    Aortic atherosclerosis (HCC)    Arthritis    back   Asthma    AVM (arteriovenous malformation)    Barrett esophagus    C. difficile diarrhea    Carotid stenosis    Complication of anesthesia    very hard to come out of anesthesia and pt states she gets very" loopy" after surgeries   COPD (chronic obstructive pulmonary disease) (HCC)    no inhalers   Depression    Diabetes mellitus without complication (HCC)    borderline   Diverticulosis    GERD (gastroesophageal reflux disease)    Heart murmur    Hx of adenomatous colonic polyps    Hyperlipidemia    IBS (irritable bowel syndrome)    Insomnia    Iron deficiency anemia    Kidney cysts    hx UTIs   Macular degeneration    left eye   Microscopic hematuria    Osteoporosis    mild   PVD (peripheral vascular disease) (HCC)    Wears dentures    partial lower    PAST SURGICAL HISTORY: Past Surgical History:  Procedure Laterality Date   BUNIONECTOMY     right foot   CATARACT EXTRACTION  both eyes   CERVICAL CONE BIOPSY     CHOLECYSTECTOMY     COLONOSCOPY     COLONOSCOPY WITH PROPOFOL N/A 01/22/2019   Procedure: COLONOSCOPY WITH BIOPSY;  Surgeon: Midge Minium, MD;  Location: [facility];  Service: Endoscopy;  Laterality: N/A;  diabetic - diet controlled   COLONOSCOPY WITH PROPOFOL N/A 06/27/2020   Procedure: COLONOSCOPY WITH BIOPSY;  Surgeon: Midge Minium, MD;  Location: [facility];  Service: Endoscopy;  Laterality: N/A;  priority 4   ESOPHAGOGASTRODUODENOSCOPY (EGD) WITH PROPOFOL N/A 08/29/2017   Procedure: ESOPHAGOGASTRODUODENOSCOPY (EGD) WITH PROPOFOL;  Surgeon: Midge Minium, MD;  Location: [facility];  Service: Endoscopy;   Laterality: N/A;   FOOT SURGERY     right 2nd toe   HIP ARTHROPLASTY Left 05/16/2018   Procedure: ARTHROPLASTY BIPOLAR HIP (HEMIARTHROPLASTY);  Surgeon: Christena Flake, MD;  Location: [facility];  Service: Orthopedics;  Laterality: Left;   ORIF PERIPROSTHETIC FRACTURE Left 12/10/2021   Procedure: OPEN REDUCTION INTERNAL FIXATION OF A LEFT GREATER TROCHANTERIC FRACTURE;  Surgeon: Christena Flake, MD;  Location: [facility];  Service: Orthopedics;  Laterality: Left;   ORIF WRIST FRACTURE Right 10/07/2020   Procedure: OPEN REDUCTION INTERNAL FIXATION (ORIF) WRIST FRACTURE;  Surgeon: Kennedy Bucker, MD;  Location: [facility];  Service: Orthopedics;  Laterality: Right;   POLYPECTOMY N/A 01/22/2019   Procedure: POLYPECTOMY;  Surgeon: Midge Minium, MD;  Location: [facility];  Service: Endoscopy;  Laterality: N/A;  2 Clips placed at polyp removal site in ascending colon   POLYPECTOMY N/A 06/27/2020   Procedure: POLYPECTOMY;  Surgeon: Midge Minium, MD;  Location: [facility];  Service: Endoscopy;  Laterality: N/A;   TUBAL LIGATION     UPPER GASTROINTESTINAL ENDOSCOPY      FAMILY HISTORY: Family History  Problem Relation Age of Onset   Diabetes Mother    Heart disease Mother    Hypertension Mother    Diabetes Son    Diabetes Sister    Inflammatory bowel disease Father        diverticulitis   Multiple myeloma Father    Hypertension Son    Diabetes Maternal Grandmother    Colon cancer Neg Hx    Esophageal cancer Neg Hx    Rectal cancer Neg Hx    Stomach cancer Neg Hx    Breast cancer Neg Hx     HEALTH MAINTENANCE: Social History   Tobacco Use   Smoking status: Every Day    Current packs/day: 0.50    Average packs/day: 0.5 packs/day for 55.0 years (27.5 ttl pk-yrs)    Types: Cigarettes   Smokeless tobacco: Never   Tobacco comments:    she has patches to start just not started yet. smoked since teenager.  Vaping Use   Vaping status: Never Used  Substance Use Topics   Alcohol use:  Yes    Alcohol/week: 2.0 standard drinks of alcohol    Types: 2 Glasses of wine per week    Comment: occ wine 1-2 weekly   Drug use: No     Allergies  Allergen Reactions   Codeine     "felt drunk, crazy"   Hydrocodone Other (See Comments)   Hydrocodone-Acetaminophen Other (See Comments)    Confusion/delirium   Lidocaine     Heart racing, increased BP - from local at dentist (likely epi)   Nsaids Other (See Comments)    Avoids due to IBS symptoms (bleeding symptoms)   Oxycodone     Confusion/delirium    Current  Outpatient Medications  Medication Sig Dispense Refill   acetaminophen (TYLENOL) 500 MG tablet Take 1,000 mg by mouth every 6 (six) hours as needed.     Ascorbic Acid (VITAMIN C) 1000 MG tablet Take 1,000 mg by mouth in the morning.     B Complex-C-Folic Acid (STRESS B COMPLEX PO) Take 1 capsule by mouth in the morning.     denosumab (PROLIA) 60 MG/ML SOSY injection Inject 60 mg into the skin every 6 (six) months.     diazepam (VALIUM) 5 MG tablet Take 1 tablet (5 mg total) by mouth daily as needed for anxiety. (Patient taking differently: Take 5 mg by mouth 2 (two) times daily as needed for anxiety.) 10 tablet 0   ferrous gluconate (FERGON) 240 (27 FE) MG tablet Take 240 mg by mouth in the morning.     fluticasone (FLONASE) 50 MCG/ACT nasal spray Place 1 spray into both nostrils daily as needed for allergies or rhinitis.     loperamide (IMODIUM) 2 MG capsule Take 2 mg by mouth every morning.     Misc Natural Products (BRAINSTRONG MEMORY SUPPORT PO) Take 2 capsules by mouth 3 (three) times a week. [employer] - Memory [facility] Brain [facility]     Multiple Vitamins-Minerals (PRESERVISION AREDS 2 PO) Take 2 tablets by mouth in the morning.     omeprazole (PRILOSEC) 40 MG capsule Take 40 mg by mouth daily before breakfast.     simvastatin (ZOCOR) 40 MG tablet Take 40 mg by mouth at bedtime.     venlafaxine (EFFEXOR) 25 MG tablet Take 25 mg by mouth 2 (two) times daily.      Vibegron (GEMTESA) 75 MG TABS Take 1 tablet (75 mg total) by mouth daily. 42 tablet 0   No current facility-administered medications for this visit.    OBJECTIVE: Vitals:   03/25/23 1019  BP: 124/71  Pulse: 65  Resp: 18  SpO2: 99%     Body mass index is 19.73 kg/m.      General: Well-developed, well-nourished, no acute distress. Eyes: Pink conjunctiva, anicteric sclera. HEENT: Normocephalic, moist mucous membranes, clear oropharnyx. Lungs: Clear to auscultation bilaterally. Heart: Regular rate and rhythm. No rubs, murmurs, or gallops. Abdomen: Soft, nontender, nondistended. No organomegaly noted, normoactive bowel sounds. Musculoskeletal: No edema, cyanosis, or clubbing. Neuro: Alert, answering all questions appropriately. Cranial nerves grossly intact. Skin: No rashes or petechiae noted. Psych: Normal affect. Lymphatics: No cervical, calvicular, axillary or inguinal LAD.   LAB RESULTS:  Lab Results  Component Value Date   NA 136 12/12/2021   K 3.7 12/12/2021   CL 104 12/12/2021   CO2 26 12/12/2021   GLUCOSE 123 (H) 12/12/2021   BUN 11 12/12/2021   CREATININE 0.67 12/12/2021   CALCIUM 8.3 (L) 12/12/2021   PROT 7.1 05/15/2018   ALBUMIN 4.2 05/15/2018   AST 25 05/15/2018   ALT 16 05/15/2018   ALKPHOS 62 05/15/2018   BILITOT 0.6 05/15/2018   GFRNONAA >60 12/12/2021   GFRAA >60 05/18/2018    Lab Results  Component Value Date   WBC 7.7 03/25/2023   NEUTROABS 6.0 03/25/2023   HGB 10.7 (L) 03/25/2023   HCT 35.5 (L) 03/25/2023   MCV 88.5 03/25/2023   PLT 435 (H) 03/25/2023    Lab Results  Component Value Date   TIBC 412 03/25/2023   FERRITIN 18 03/25/2023   IRONPCTSAT 4 (L) 03/25/2023   IRONPCTSAT 26.7 05/18/2013   IRONPCTSAT 53.9 (H) 05/14/2011     STUDIES: No results  found.  ASSESSMENT AND PLAN:   Yolanda Jimenez is a 77 y.o. female with pmh of A-fib, arthritis, asthma, AVM, Barrett's esophagus, carotid stenosis, COPD, diabetes, GERD, IBS, PVD  referred to hematology for management of iron deficiency anemia.  # Iron deficiency anemia # Microscopic hematuria, chronic -Progressive.  From 12/13/2022-hemoglobin 9.2, ferritin 6.  Reports longstanding history of iron deficiency and is on Fergon 325 mg daily.    -On initial visit in May 2024, patient decided to opt for oral iron.  Repeat labs show only mild improvement in hemoglobin from 9.2-10.7.  Her iron level is pending but most likely it is going to be low.  Because her response to the oral iron is very slow, I have advised her IV Venofer 200 mg weekly x 5 doses which she is agreeable to.  Occasional side effects such as nausea and back pain was discussed.  Rarely allergic reactions can happen.  She is also scheduled to see Dr. Servando Snare GI in October. er last colonoscopy was in November 2021 with Dr. Servando Snare which showed multiple polyps and diverticulosis.  She was advised to continue with oral iron.  # Nocturia -Follows with Dr. Apolinar Junes.  She was started on Mybertiq.    # Depression -Advised to resume Effexor   # Leg pain -Reports improvement.  Follows with orthopedics.  Also scheduled to see Dr. Wyn Quaker next week.  Orders Placed This Encounter  Procedures   CBC with Differential/Platelet   Iron and TIBC   Ferritin   RTC in 4 months for MD visit, labs, possible Venofer  Patient expressed understanding and was in agreement with this plan. She also understands that She can call clinic at any time with any questions, concerns, or complaints.   I spent a total of 30 minutes reviewing chart data, face-to-face evaluation with the patient, counseling and coordination of care as detailed above.  Michaelyn Barter, MD   03/25/2023 11:13 AM

## 2023-03-29 ENCOUNTER — Encounter (INDEPENDENT_AMBULATORY_CARE_PROVIDER_SITE_OTHER): Payer: Self-pay | Admitting: Vascular Surgery

## 2023-03-29 ENCOUNTER — Ambulatory Visit (INDEPENDENT_AMBULATORY_CARE_PROVIDER_SITE_OTHER): Payer: PPO | Admitting: Vascular Surgery

## 2023-03-29 VITALS — BP 128/85 | HR 84 | Resp 16 | Ht 60.0 in | Wt 103.0 lb

## 2023-03-29 DIAGNOSIS — I70219 Atherosclerosis of native arteries of extremities with intermittent claudication, unspecified extremity: Secondary | ICD-10-CM | POA: Insufficient documentation

## 2023-03-29 DIAGNOSIS — E1151 Type 2 diabetes mellitus with diabetic peripheral angiopathy without gangrene: Secondary | ICD-10-CM

## 2023-03-29 DIAGNOSIS — E785 Hyperlipidemia, unspecified: Secondary | ICD-10-CM

## 2023-03-29 DIAGNOSIS — I70212 Atherosclerosis of native arteries of extremities with intermittent claudication, left leg: Secondary | ICD-10-CM | POA: Diagnosis not present

## 2023-03-29 DIAGNOSIS — I6523 Occlusion and stenosis of bilateral carotid arteries: Secondary | ICD-10-CM | POA: Diagnosis not present

## 2023-03-29 LAB — VAS US ABI WITH/WO TBI
Left ABI: 0.61
Right ABI: 0.99

## 2023-03-29 NOTE — Assessment & Plan Note (Signed)
Being checked serially with duplex.  No new focal neurologic symptoms.

## 2023-03-29 NOTE — Patient Instructions (Signed)
Angiogram  An angiogram is a procedure used to examine the blood vessels. In this procedure, contrast dye is injected through a soft tube (catheter) into an artery. X-rays are then taken, which show if there is a blockage or problem in a blood vessel. The catheter may be inserted in: The groin area. This is the most common. The fold of the arm, near the elbow. The wrist. Tell a health care provider about: Any allergies you have, including allergies to medicines, shellfish, contrast dye, or iodine. All medicines you are taking, including vitamins, herbs, eye drops, creams, and over-the-counter medicines. Any problems you or family members have had with anesthesia. Any bleeding problems you have. Any surgeries you have had. Any medical conditions you have or have had, including any kidney problems or kidney failure. Whether you are pregnant or may be pregnant. Whether you are breastfeeding. Any condition that may increase your stress and prevent you from lying still. This includes anxiety disorders or chronic pain. What are the risks? Your health care provider will talk with you about risks. These may include: Infection. Bleeding and bruising. Allergic reactions to medicines or dyes, including the contrast dye used. Damage to nearby structures or organs, including damage to blood vessels and kidney damage from the contrast dye. Blood clots that can lead to a stroke or heart attack. Death. What happens before the procedure? When to stop eating and drinking Follow instructions from your health care provider about what you may eat and drink before your procedure. These may include: 8 hours before your procedure Stop eating most foods. Do not eat meat, fried foods, or fatty foods. Eat only light foods, such as toast or crackers. All liquids are okay except energy drinks and alcohol. 6 hours before your procedure Stop eating. Drink only clear liquids, such as water, clear fruit juice,  black coffee, plain tea, and sports drinks. Do not drink energy drinks or alcohol. 2 hours before your procedure Stop drinking all liquids. You may be allowed to take medicines with small sips of water. If you do not follow your health care provider's instructions, your procedure may be delayed or canceled. Medicines Ask your health care provider about: Changing or stopping your regular medicines. These include any diabetes medicines or blood thinners you take. Taking medicines such as aspirin and ibuprofen. These medicines can thin your blood. Do not take them unless your health care provider tells you to. Taking over-the-counter medicines, vitamins, herbs, and supplements. Surgery safety Ask your health care provider: How your insertion site will be marked. What steps will be taken to help prevent infection. These may include: Removing hair at the insertion site. Washing skin with a germ-killing soap. General instructions Do not use any products that contain nicotine or tobacco for at least 4 weeks before the procedure. These products include cigarettes, chewing tobacco, and vaping devices, such as e-cigarettes. If you need help quitting, ask your health care provider. You may have blood samples taken. If you will be going home right after the procedure, plan to have a responsible adult: Take you home from the hospital or clinic. You will not be allowed to drive. Care for you for the time you are told. What happens during the procedure? You will lie on your back on an X-ray table. You may be strapped to the table if it is tilted. An IV will be inserted into one of your veins. Electrodes may be placed on your chest to monitor your heart rate during the procedure.  You will be given one or both of the following: A sedative. This helps you relax. Anesthesia. This will numb the area where the catheter will be inserted. A small incision will be made for catheter insertion. The catheter  will be inserted into an artery using a guide wire. An X-ray procedure (fluoroscopy) will be used to help guide the catheter to the blood vessel to be examined. A contrast dye will then be injected into the catheter, and X-rays will be taken. The contrast will help to show where any narrowing or blockages are located in the blood vessels. You may feel flushed as the contrast dye is injected. Tell your health care provider if you develop chest pain or trouble breathing. After the fluoroscopy is complete, the catheter will be removed. Pressure will be applied to stop bleeding. A closure device may also be used. A bandage (dressing) will be placed over the site where the catheter was inserted. The procedure may vary among health care providers and hospitals. What happens after the procedure? Your blood pressure, heart rate, breathing rate, and blood oxygen level will be monitored until you leave the hospital or clinic. You will be kept in bed lying flat for a period of time. If the catheter was inserted through your leg, you will be instructed not to bend or cross your legs. The insertion area and the pulse in your feet or wrist will be checked often. You will be instructed to drink plenty of fluids. This will help wash the contrast dye out of your body. You may have more blood tests and X-rays. You may also have a test that records the electrical activity of your heart (electrocardiogram, or ECG). If you were given a sedative, do not drive or operate machinery until your health care provider says that it is safe. You may have to avoid lifting. Ask your health care provider how much you can safely lift. It is up to you to get your test results. Ask your health care provider, or the department that is doing the test, when your results will be ready. This information is not intended to replace advice given to you by your health care provider. Make sure you discuss any questions you have with your health  care provider. Document Revised: 02/17/2022 Document Reviewed: 02/17/2022 Elsevier Patient Education  2024 [employer].

## 2023-03-29 NOTE — Progress Notes (Signed)
MRN : [id number]  Yolanda Jimenez is a 77 y.o. (07/06/1946) female who presents with chief complaint of  Chief Complaint  Patient presents with   Follow-up    6 months with ABI  .  History of Present Illness: Patient returns today in follow up of PAD and left lower extremity claudication symptoms.  Her claudication symptoms have gotten slightly better from her last visit but she is still able to walk only about 50 steps before she has to stop and rest.  Symptoms are all on the left leg.  No ulceration or infection.  She is having spells where the pain will wake her at night and she has to dangle her leg or get up and move around.  Previous ABIs showed her right ABI to be normal with a drop in the left ABI on the 0.7 range.  Current Outpatient Medications  Medication Sig Dispense Refill   acetaminophen (TYLENOL) 500 MG tablet Take 1,000 mg by mouth every 6 (six) hours as needed.     Ascorbic Acid (VITAMIN C) 1000 MG tablet Take 1,000 mg by mouth in the morning.     B Complex-C-Folic Acid (STRESS B COMPLEX PO) Take 1 capsule by mouth in the morning.     denosumab (PROLIA) 60 MG/ML SOSY injection Inject 60 mg into the skin every 6 (six) months.     diazepam (VALIUM) 5 MG tablet Take 1 tablet (5 mg total) by mouth daily as needed for anxiety. (Patient taking differently: Take 5 mg by mouth 2 (two) times daily as needed for anxiety.) 10 tablet 0   ferrous gluconate (FERGON) 240 (27 FE) MG tablet Take 240 mg by mouth in the morning.     fluticasone (FLONASE) 50 MCG/ACT nasal spray Place 1 spray into both nostrils daily as needed for allergies or rhinitis.     loperamide (IMODIUM) 2 MG capsule Take 2 mg by mouth every morning.     Misc Natural Products (BRAINSTRONG MEMORY SUPPORT PO) Take 2 capsules by mouth 3 (three) times a week. [facility] [facility]     Multiple Vitamins-Minerals (PRESERVISION AREDS 2 PO) Take 2 tablets by mouth in the morning.     omeprazole  (PRILOSEC) 40 MG capsule Take 40 mg by mouth daily before breakfast.     simvastatin (ZOCOR) 40 MG tablet Take 40 mg by mouth at bedtime.     venlafaxine (EFFEXOR) 25 MG tablet Take 25 mg by mouth 2 (two) times daily.     Vibegron (GEMTESA) 75 MG TABS Take 1 tablet (75 mg total) by mouth daily. 42 tablet 0   No current facility-administered medications for this visit.    Past Medical History:  Diagnosis Date   A-fib [facility])    Aortic atherosclerosis (HCC)    Arthritis    back   Asthma    AVM (arteriovenous malformation)    Barrett esophagus    C. difficile diarrhea    Carotid stenosis    Complication of anesthesia    very hard to come out of anesthesia and pt states she gets very" loopy" after surgeries   COPD (chronic obstructive pulmonary disease) (HCC)    no inhalers   Depression    Diabetes mellitus without complication (HCC)    borderline   Diverticulosis    GERD (gastroesophageal reflux disease)    Heart murmur    Hx of adenomatous colonic polyps    Hyperlipidemia    IBS (irritable bowel syndrome)  Insomnia    Iron deficiency anemia    Kidney cysts    hx UTIs   Macular degeneration    left eye   Microscopic hematuria    Osteoporosis    mild   PVD (peripheral vascular disease) (HCC)    Wears dentures    partial lower    Past Surgical History:  Procedure Laterality Date   BUNIONECTOMY     right foot   CATARACT EXTRACTION     both eyes   CERVICAL CONE BIOPSY     CHOLECYSTECTOMY     COLONOSCOPY     COLONOSCOPY WITH PROPOFOL N/A 01/22/2019   Procedure: COLONOSCOPY WITH BIOPSY;  Surgeon: Midge Minium, MD;  Location: [facility];  Service: Endoscopy;  Laterality: N/A;  diabetic - diet controlled   COLONOSCOPY WITH PROPOFOL N/A 06/27/2020   Procedure: COLONOSCOPY WITH BIOPSY;  Surgeon: Midge Minium, MD;  Location: [facility];  Service: Endoscopy;  Laterality: N/A;  priority 4   ESOPHAGOGASTRODUODENOSCOPY (EGD) WITH PROPOFOL N/A 08/29/2017    Procedure: ESOPHAGOGASTRODUODENOSCOPY (EGD) WITH PROPOFOL;  Surgeon: Midge Minium, MD;  Location: [facility];  Service: Endoscopy;  Laterality: N/A;   FOOT SURGERY     right 2nd toe   HIP ARTHROPLASTY Left 05/16/2018   Procedure: ARTHROPLASTY BIPOLAR HIP (HEMIARTHROPLASTY);  Surgeon: Christena Flake, MD;  Location: [facility] OR[facility];  Service: Orthopedics;  Laterality: Left;   ORIF PERIPROSTHETIC FRACTURE Left 12/10/2021   Procedure: OPEN REDUCTION INTERNAL FIXATION OF A LEFT GREATER TROCHANTERIC FRACTURE;  Surgeon: Christena Flake, MD;  Location: [facility];  Service: Orthopedics;  Laterality: Left;   ORIF WRIST FRACTURE Right 10/07/2020   Procedure: OPEN REDUCTION INTERNAL FIXATION (ORIF) WRIST FRACTURE;  Surgeon: Kennedy Bucker, MD;  Location: [facility];  Service: Orthopedics;  Laterality: Right;   POLYPECTOMY N/A 01/22/2019   Procedure: POLYPECTOMY;  Surgeon: Midge Minium, MD;  Location: [facility];  Service: Endoscopy;  Laterality: N/A;  2 Clips placed at polyp removal site in ascending colon   POLYPECTOMY N/A 06/27/2020   Procedure: POLYPECTOMY;  Surgeon: Midge Minium, MD;  Location: [facility];  Service: Endoscopy;  Laterality: N/A;   TUBAL LIGATION     UPPER GASTROINTESTINAL ENDOSCOPY       Social History   Tobacco Use   Smoking status: Every Day    Current packs/day: 0.50    Average packs/day: 0.5 packs/day for 55.0 years (27.5 ttl pk-yrs)    Types: Cigarettes   Smokeless tobacco: Never   Tobacco comments:    she has patches to start just not started yet. smoked since teenager.  Vaping Use   Vaping status: Never Used  Substance Use Topics   Alcohol use: Yes    Alcohol/week: 2.0 standard drinks of alcohol    Types: 2 Glasses of wine per week    Comment: occ wine 1-2 weekly   Drug use: No       Family History  Problem Relation Age of Onset   Diabetes Mother    Heart disease Mother    Hypertension Mother    Diabetes Son    Diabetes Sister    Inflammatory bowel  disease Father        diverticulitis   Multiple myeloma Father    Hypertension Son    Diabetes Maternal Grandmother    Colon cancer Neg Hx    Esophageal cancer Neg Hx    Rectal cancer Neg Hx    Stomach cancer Neg Hx    Breast cancer Neg  Hx      Allergies  Allergen Reactions   Codeine     "felt drunk, crazy"   Hydrocodone Other (See Comments)   Hydrocodone-Acetaminophen Other (See Comments)    Confusion/delirium   Lidocaine     Heart racing, increased BP - from local at dentist (likely epi)   Nsaids Other (See Comments)    Avoids due to IBS symptoms (bleeding symptoms)   Oxycodone     Confusion/delirium   REVIEW OF SYSTEMS (Negative unless checked)   Constitutional: [] Weight loss  [] Fever  [] Chills Cardiac: [] Chest pain   [] Chest pressure   [] Palpitations   [] Shortness of breath when laying flat   [] Shortness of breath at rest   [] Shortness of breath with exertion. Vascular:  [x] Pain in legs with walking   [] Pain in legs at rest   [] Pain in legs when laying flat   [x] Claudication   [] Pain in feet when walking  [x] Pain in feet at rest  [] Pain in feet when laying flat   [] History of DVT   [] Phlebitis   [] Swelling in legs   [] Varicose veins   [] Non-healing ulcers Pulmonary:   [] Uses home oxygen   [] Productive cough   [] Hemoptysis   [] Wheeze  [] COPD   [] Asthma Neurologic:  [] Dizziness  [] Blackouts   [] Seizures   [] History of stroke   [] History of TIA  [] Aphasia   [] Temporary blindness   [] Dysphagia   [] Weakness or numbness in arms   [] Weakness or numbness in legs Musculoskeletal:  [x] Arthritis   [] Joint swelling   [] Joint pain   [] Low back pain Hematologic:  [] Easy bruising  [] Easy bleeding   [] Hypercoagulable state   [] Anemic   Gastrointestinal:  [] Blood in stool   [] Vomiting blood  [x] Gastroesophageal reflux/heartburn   [] Abdominal pain Genitourinary:  [] Chronic kidney disease   [] Difficult urination  [] Frequent urination  [] Burning with urination   [x] Hematuria Skin:  [] Rashes    [] Ulcers   [] Wounds Psychological:  [] History of anxiety   [x]  History of major depression.   Physical Examination  BP 128/85 (BP Location: Left Arm)   Pulse 84   Resp 16   Ht 5' (1.524 m)   Wt 103 lb (46.7 kg)   BMI 20.12 kg/m  Gen:  WD/WN, NAD Head: [place]/AT, No temporalis wasting. Ear/Nose/Throat: Hearing grossly intact, nares w/o erythema or drainage Eyes: Conjunctiva clear. Sclera non-icteric Neck: Supple.  Trachea midline Pulmonary:  Good air movement, no use of accessory muscles.  Cardiac: RRR, no JVD Vascular:  Vessel Right Left  Radial Palpable Palpable                          PT 2+ Palpable Not Palpable  DP 2+ Palpable 1+ Palpable   Gastrointestinal: soft, non-tender/non-distended. No guarding/reflex.  Musculoskeletal: M/S 5/5 throughout.  No deformity or atrophy.  No significant lower extremity edema. Neurologic: Sensation grossly intact in extremities.  Symmetrical.  Speech is fluent.  Psychiatric: Judgment intact, Mood & affect appropriate for pt's clinical situation. Dermatologic: No rashes or ulcers noted.  No cellulitis or open wounds.      Labs Recent Results (from the past 2160 hour(s))  Urinalysis, Complete     Status: Abnormal   Collection Time: 01/11/23  2:33 PM  Result Value Ref Range   Specific Gravity, UA >1.030 (H) 1.005 - 1.030   pH, UA 5.5 5.0 - 7.5   Color, UA Amber (A) Yellow   Appearance Ur Clear Clear   Leukocytes,UA Negative Negative  Protein,UA 2+ (A) Negative/Trace   Glucose, UA Negative Negative   Ketones, UA Negative Negative   RBC, UA 2+ (A) Negative   Bilirubin, UA Negative Negative   Urobilinogen, Ur 0.2 0.2 - 1.0 mg/dL   Nitrite, UA Negative Negative   Microscopic Examination See below:   Microscopic Examination     Status: Abnormal   Collection Time: 01/11/23  2:33 PM   Urine  Result Value Ref Range   WBC, UA 0-5 0 - 5 /hpf   RBC, Urine 3-10 (A) 0 - 2 /hpf   Epithelial Cells (non renal) 0-10 0 - 10 /hpf   Casts  Present (A) None seen /lpf   Cast Type Hyaline casts N/A   Mucus, UA Present (A) Not Estab.   Bacteria, UA Moderate (A) None seen/Few  Bladder Scan (Post Void Residual) in office     Status: None   Collection Time: 02/22/23 11:26 AM  Result Value Ref Range   Scan Result 0ml   VAS [place] ABI WITH/WO TBI     Status: None   Collection Time: 03/23/23  8:24 AM  Result Value Ref Range   Right ABI 0.99    Left ABI 0.61   Ferritin     Status: None   Collection Time: 03/25/23  9:51 AM  Result Value Ref Range   Ferritin 18 11 - 307 ng/mL    Comment: Performed at [facility], [address] [place], [place] [address]  Iron and TIBC     Status: Abnormal   Collection Time: 03/25/23  9:51 AM  Result Value Ref Range   Iron 18 (L) 28 - 170 ug/dL   TIBC 098 119 - 147 ug/dL   Saturation Ratios 4 (L) 10.4 - 31.8 %   UIBC 394 ug/dL    Comment: Performed at [facility], [address] [place], [place] [address]  CBC with Differential/Platelet     Status: Abnormal   Collection Time: 03/25/23  9:51 AM  Result Value Ref Range   WBC 7.7 4.0 - 10.5 K/uL   RBC 4.01 3.87 - 5.11 MIL/uL   Hemoglobin 10.7 (L) 12.0 - 15.0 g/dL   HCT 21.3 (L) 08.6 - 57.8 %   MCV 88.5 80.0 - 100.0 fL   MCH 26.7 26.0 - 34.0 pg   MCHC 30.1 30.0 - 36.0 g/dL   RDW 46.9 (H) 62.9 - 52.8 %   Platelets 435 (H) 150 - 400 K/uL   nRBC 0.0 0.0 - 0.2 %   Neutrophils Relative % 77 %   Neutro Abs 6.0 1.7 - 7.7 K/uL   Lymphocytes Relative 11 %   Lymphs Abs 0.8 0.7 - 4.0 K/uL   Monocytes Relative 9 %   Monocytes Absolute 0.7 0.1 - 1.0 K/uL   Eosinophils Relative 2 %   Eosinophils Absolute 0.1 0.0 - 0.5 K/uL   Basophils Relative 1 %   Basophils Absolute 0.1 0.0 - 0.1 K/uL   Immature Granulocytes 0 %   Abs Immature Granulocytes 0.02 0.00 - 0.07 K/uL    Comment: Performed at [facility], [address], [place], [place] [address]    Radiology VAS [place] ABI WITH/WO TBI  Result Date: 03/29/2023  LOWER  EXTREMITY DOPPLER STUDY Patient Name:  Yolanda Jimenez  Date of Exam:   03/23/2023 Medical Rec #: [id number]       Accession #:    [id number] Date of Birth: 01-30-1946       Patient Gender: F Patient  Age:   73 years Exam Location:  [facility] Procedure:      VAS [place] ABI WITH/WO TBI Referring Phys: --------------------------------------------------------------------------------  Indications: Claudication, and peripheral artery disease. High Risk Factors: Hyperlipidemia, Diabetes, current smoker.  Performing Technologist: Hardie Lora RVT  Examination Guidelines: A complete evaluation includes at minimum, Doppler waveform signals and systolic blood pressure reading at the level of bilateral brachial, anterior tibial, and posterior tibial arteries, when vessel segments are accessible. Bilateral testing is considered an integral part of a complete examination. Photoelectric Plethysmograph (PPG) waveforms and toe systolic pressure readings are included as required and additional duplex testing as needed. Limited examinations for reoccurring indications may be performed as noted.  ABI Findings: +---------+------------------+-----+---------+-------+ Right    Rt Pressure (mmHg)IndexWaveform Comment +---------+------------------+-----+---------+-------+ Brachial 145                                     +---------+------------------+-----+---------+-------+ PTA      150               0.99 triphasic        +---------+------------------+-----+---------+-------+ DP       149               0.98 triphasic        +---------+------------------+-----+---------+-------+ Great Toe57                0.38                  +---------+------------------+-----+---------+-------+ +---------+------------------+-----+----------+-------+ Left     Lt Pressure (mmHg)IndexWaveform  Comment +---------+------------------+-----+----------+-------+ Brachial 152                                       +---------+------------------+-----+----------+-------+ PTA      84                0.55 monophasic        +---------+------------------+-----+----------+-------+ DP       93                0.61 monophasic        +---------+------------------+-----+----------+-------+ Great Toe7                 0.05                   +---------+------------------+-----+----------+-------+ +-------+-----------+-----------+------------+------------+ ABI/TBIToday's ABIToday's TBIPrevious ABIPrevious TBI +-------+-----------+-----------+------------+------------+ Right  0.99       0.38       1.05        0.90         +-------+-----------+-----------+------------+------------+ Left   0.61       0.05       0.71        0.24         +-------+-----------+-----------+------------+------------+  Right ABIs appear essentially unchanged compared to prior study on 10/22/2022. Compared to prior study on 10/22/2022.  Summary: Right: Resting right ankle-brachial index is within normal range. The right toe-brachial index is abnormal. Left: Resting left ankle-brachial index indicates moderate left lower extremity arterial disease. The left toe-brachial index is abnormal. *See table(s) above for measurements and observations.  Electronically signed by Festus Barren MD on 03/29/2023 at 10:31:01 AM.    Final    VAS [place] LOWER EXTREMITY ARTERIAL DUPLEX  Result Date: 03/29/2023 LOWER EXTREMITY ARTERIAL DUPLEX STUDY Patient Name:  Yolanda Jimenez  Date of Exam:   03/23/2023 Medical Rec #: [id number]       Accession #:    [id number] Date of Birth: 03/07/1946       Patient Gender: F Patient Age:   59 years Exam Location:  [facility] Procedure:      VAS [place] LOWER EXTREMITY ARTERIAL DUPLEX Referring Phys: Sheppard Plumber --------------------------------------------------------------------------------  Indications: Peripheral artery disease. High Risk Factors: Hyperlipidemia, Diabetes, current smoker.  Current ABI: Right:  0.99, Left 0.61 Performing Technologist: Hardie Lora RVT  Examination Guidelines: A complete evaluation includes B-mode imaging, spectral Doppler, color Doppler, and power Doppler as needed of all accessible portions of each vessel. Bilateral testing is considered an integral part of a complete examination. Limited examinations for reoccurring indications may be performed as noted.   +----------+--------+-----+---------------+----------+----------------------+ LEFT      PSV cm/sRatioStenosis       Waveform  Comments               +----------+--------+-----+---------------+----------+----------------------+ CIA Prox  265          50-74% stenosisbiphasic                         +----------+--------+-----+---------------+----------+----------------------+ EIA Prox  196                         monophasic                       +----------+--------+-----+---------------+----------+----------------------+ EIA Distal42                          monophasic                       +----------+--------+-----+---------------+----------+----------------------+ CFA Distal47                          monophasicHeavy calcified plaque +----------+--------+-----+---------------+----------+----------------------+ DFA       15                          monophasic                       +----------+--------+-----+---------------+----------+----------------------+ SFA Prox  45                          monophasic                       +----------+--------+-----+---------------+----------+----------------------+ SFA Mid   39                          monophasic                       +----------+--------+-----+---------------+----------+----------------------+ SFA Distal28                          monophasic                       +----------+--------+-----+---------------+----------+----------------------+ POP Prox  21                          monophasic                        +----------+--------+-----+---------------+----------+----------------------+  POP Distal25                          monophasic                       +----------+--------+-----+---------------+----------+----------------------+ ATA Distal24                          monophasic                       +----------+--------+-----+---------------+----------+----------------------+ PTA Distal14                          monophasic                       +----------+--------+-----+---------------+----------+----------------------+  Summary: Right: 50-74% stenosis noted in the common femoral artery. Left: 50-74% stenosis noted in the iliac segment. 50-74% stenosis noted in the common femoral artery. Hemodynamically significant irregular calcified plaque seen in the iliac and common femoral artery.  See table(s) above for measurements and observations. Electronically signed by Festus Barren MD on 03/29/2023 at 10:30:47 AM.    Final     Assessment/Plan Diabetes mellitus, type 2 blood glucose control important in reducing the progression of atherosclerotic disease. Also, involved in wound healing. On appropriate medications.     Hyperlipidemia lipid control important in reducing the progression of atherosclerotic disease. Continue statin therapy  Carotid stenosis Being checked serially with duplex.  No new focal neurologic symptoms.  Atherosclerosis of native arteries of extremity with intermittent claudication (HCC) The patient has a drop in her left ABI with marked worsening in her left leg symptoms over the past several months.  A trial of conservative therapy with increasing exercise has resulted in some improvement in her claudication symptoms but they still remain quite severe.  She now desires to proceed with angiography and possible revascularization.  The risks and benefits of the procedure were discussed in detail.  The patient is agreeable with proceeding in the near future.    Festus Barren, MD  03/29/2023 4:24 PM    This note was created with Dragon medical transcription system.  Any errors from dictation are purely unintentional

## 2023-03-29 NOTE — Assessment & Plan Note (Signed)
The patient has a drop in her left ABI with marked worsening in her left leg symptoms over the past several months.  A trial of conservative therapy with increasing exercise has resulted in some improvement in her claudication symptoms but they still remain quite severe.  She now desires to proceed with angiography and possible revascularization.  The risks and benefits of the procedure were discussed in detail.  The patient is agreeable with proceeding in the near future.

## 2023-03-30 ENCOUNTER — Inpatient Hospital Stay: Payer: PPO

## 2023-03-31 ENCOUNTER — Telehealth (INDEPENDENT_AMBULATORY_CARE_PROVIDER_SITE_OTHER): Payer: Self-pay

## 2023-03-31 ENCOUNTER — Inpatient Hospital Stay: Payer: PPO

## 2023-03-31 VITALS — BP 144/77 | HR 62 | Temp 98.9°F | Resp 18

## 2023-03-31 DIAGNOSIS — D500 Iron deficiency anemia secondary to blood loss (chronic): Secondary | ICD-10-CM

## 2023-03-31 DIAGNOSIS — D509 Iron deficiency anemia, unspecified: Secondary | ICD-10-CM | POA: Diagnosis not present

## 2023-03-31 MED ORDER — SODIUM CHLORIDE 0.9 % IV SOLN
200.0000 mg | Freq: Once | INTRAVENOUS | Status: AC
  Administered 2023-03-31: 200 mg via INTRAVENOUS
  Filled 2023-03-31: qty 200

## 2023-03-31 MED ORDER — SODIUM CHLORIDE 0.9 % IV SOLN
Freq: Once | INTRAVENOUS | Status: AC
  Filled 2023-03-31: qty 250

## 2023-03-31 NOTE — Telephone Encounter (Signed)
I left a message for the patient to schedule her for a LLE angio with Dr. Wyn Quaker. I stated I would call back tomorrow.

## 2023-04-04 ENCOUNTER — Telehealth (INDEPENDENT_AMBULATORY_CARE_PROVIDER_SITE_OTHER): Payer: Self-pay

## 2023-04-04 NOTE — Telephone Encounter (Signed)
Spoke with the patient and she is scheduled with Dr. Wyn Quaker for LLE angio with a 6:45 am arrival time to the [facility]. Pre-procedure instructions were discussed and will be sent to Mychart and mailed.

## 2023-04-05 ENCOUNTER — Ambulatory Visit: Payer: PPO | Admitting: Physician Assistant

## 2023-04-05 ENCOUNTER — Encounter: Payer: Self-pay | Admitting: Physician Assistant

## 2023-04-05 VITALS — BP 92/64 | HR 88 | Ht 60.0 in | Wt 99.0 lb

## 2023-04-05 DIAGNOSIS — R82998 Other abnormal findings in urine: Secondary | ICD-10-CM | POA: Diagnosis not present

## 2023-04-05 DIAGNOSIS — R351 Nocturia: Secondary | ICD-10-CM

## 2023-04-05 DIAGNOSIS — R3129 Other microscopic hematuria: Secondary | ICD-10-CM | POA: Diagnosis not present

## 2023-04-05 DIAGNOSIS — R310 Gross hematuria: Secondary | ICD-10-CM | POA: Diagnosis not present

## 2023-04-05 LAB — URINALYSIS, COMPLETE
Bilirubin, UA: NEGATIVE
Glucose, UA: NEGATIVE
Ketones, UA: NEGATIVE
Nitrite, UA: NEGATIVE
Specific Gravity, UA: 1.025 (ref 1.005–1.030)
Urobilinogen, Ur: 0.2 mg/dL (ref 0.2–1.0)
pH, UA: 6 (ref 5.0–7.5)

## 2023-04-05 LAB — MICROSCOPIC EXAMINATION

## 2023-04-05 LAB — BLADDER SCAN AMB NON-IMAGING: Scan Result: 0

## 2023-04-05 NOTE — Progress Notes (Signed)
04/05/2023 11:29 AM   Yolanda Jimenez 03-Aug-1946 [id number]  CC: Chief Complaint  Patient presents with   Other   HPI: Yolanda Jimenez is a 77 y.o. female with PMH hematuria with benign cystoscopy in February 2023; left renal cyst; recurrent UTI; LUTS including urgency, frequency, and weak stream; and nocturia who failed Myrbetriq who presents today for follow-up on Gemtesa.   Today she reports Yolanda Jimenez has not helped with her nocturia.  She again denies daytime frequency.  This is her fifth visit in 6 months regarding her nocturia.  She has not yet pursued sleep study with Dr. Hyacinth Meeker as recommended previously.  She wonders today if she would be a good candidate for intravesical Botox.  She also denies dysuria, flank pain, and gross hematuria.  In-office UA today positive for 1+ blood, 2+ protein, and 1+ leukocytes; urine microscopy with 11-30 WBCs/HPF, 11-30 RBCs/HPF, and moderate bacteria. PVR 0mL.  PMH: Past Medical History:  Diagnosis Date   A-fib (HCC)    Aortic atherosclerosis (HCC)    Arthritis    back   Asthma    AVM (arteriovenous malformation)    Barrett esophagus    C. difficile diarrhea    Carotid stenosis    Complication of anesthesia    very hard to come out of anesthesia and pt states she gets very" loopy" after surgeries   COPD (chronic obstructive pulmonary disease) (HCC)    no inhalers   Depression    Diabetes mellitus without complication (HCC)    borderline   Diverticulosis    GERD (gastroesophageal reflux disease)    Heart murmur    Hx of adenomatous colonic polyps    Hyperlipidemia    IBS (irritable bowel syndrome)    Insomnia    Iron deficiency anemia    Kidney cysts    hx UTIs   Macular degeneration    left eye   Microscopic hematuria    Osteoporosis    mild   PVD (peripheral vascular disease) (HCC)    Wears dentures    partial lower    Surgical History: Past Surgical History:  Procedure Laterality Date   BUNIONECTOMY     right  foot   CATARACT EXTRACTION     both eyes   CERVICAL CONE BIOPSY     CHOLECYSTECTOMY     COLONOSCOPY     COLONOSCOPY WITH PROPOFOL N/A 01/22/2019   Procedure: COLONOSCOPY WITH BIOPSY;  Surgeon: Midge Minium, MD;  Location: [facility];  Service: Endoscopy;  Laterality: N/A;  diabetic - diet controlled   COLONOSCOPY WITH PROPOFOL N/A 06/27/2020   Procedure: COLONOSCOPY WITH BIOPSY;  Surgeon: Midge Minium, MD;  Location: [facility];  Service: Endoscopy;  Laterality: N/A;  priority 4   ESOPHAGOGASTRODUODENOSCOPY (EGD) WITH PROPOFOL N/A 08/29/2017   Procedure: ESOPHAGOGASTRODUODENOSCOPY (EGD) WITH PROPOFOL;  Surgeon: Midge Minium, MD;  Location: [facility];  Service: Endoscopy;  Laterality: N/A;   FOOT SURGERY     right 2nd toe   HIP ARTHROPLASTY Left 05/16/2018   Procedure: ARTHROPLASTY BIPOLAR HIP (HEMIARTHROPLASTY);  Surgeon: Christena Flake, MD;  Location: [facility] OR[facility];  Service: Orthopedics;  Laterality: Left;   ORIF PERIPROSTHETIC FRACTURE Left 12/10/2021   Procedure: OPEN REDUCTION INTERNAL FIXATION OF A LEFT GREATER TROCHANTERIC FRACTURE;  Surgeon: Christena Flake, MD;  Location: [facility];  Service: Orthopedics;  Laterality: Left;   ORIF WRIST FRACTURE Right 10/07/2020   Procedure: OPEN REDUCTION INTERNAL FIXATION (ORIF) WRIST FRACTURE;  Surgeon: Kennedy Bucker, MD;  Location: [facility];  Service: Orthopedics;  Laterality: Right;   POLYPECTOMY N/A 01/22/2019   Procedure: POLYPECTOMY;  Surgeon: Midge Minium, MD;  Location: [facility];  Service: Endoscopy;  Laterality: N/A;  2 Clips placed at polyp removal site in ascending colon   POLYPECTOMY N/A 06/27/2020   Procedure: POLYPECTOMY;  Surgeon: Midge Minium, MD;  Location: [facility];  Service: Endoscopy;  Laterality: N/A;   TUBAL LIGATION     UPPER GASTROINTESTINAL ENDOSCOPY      Home Medications:  Allergies as of 04/05/2023       Reactions   Codeine    "felt drunk, crazy"   Hydrocodone Other (See Comments)    Hydrocodone-acetaminophen Other (See Comments)   Confusion/delirium   Lidocaine    Heart racing, increased BP - from local at dentist (likely epi)   Nsaids Other (See Comments)   Avoids due to IBS symptoms (bleeding symptoms)   Oxycodone    Confusion/delirium        Medication List        Accurate as of April 05, 2023 11:29 AM. If you have any questions, ask your nurse or doctor.          acetaminophen 500 MG tablet Commonly known as: TYLENOL Take 1,000 mg by mouth every 6 (six) hours as needed.   BRAINSTRONG MEMORY SUPPORT PO Take 2 capsules by mouth 3 (three) times a week. [facility] [employer] [facility]   diazepam 5 MG tablet Commonly known as: VALIUM Take 1 tablet (5 mg total) by mouth daily as needed for anxiety. What changed: when to take this   ferrous gluconate 240 (27 FE) MG tablet Commonly known as: FERGON Take 240 mg by mouth in the morning.   fluticasone 50 MCG/ACT nasal spray Commonly known as: FLONASE Place 1 spray into both nostrils daily as needed for allergies or rhinitis.   Gemtesa 75 MG Tabs Generic drug: Vibegron Take 1 tablet (75 mg total) by mouth daily.   loperamide 2 MG capsule Commonly known as: IMODIUM Take 2 mg by mouth every morning.   omeprazole 40 MG capsule Commonly known as: PRILOSEC Take 40 mg by mouth daily before breakfast.   PRESERVISION AREDS 2 PO Take 2 tablets by mouth in the morning.   Prolia 60 MG/ML Sosy injection Generic drug: denosumab Inject 60 mg into the skin every 6 (six) months.   simvastatin 40 MG tablet Commonly known as: ZOCOR Take 40 mg by mouth at bedtime.   STRESS B COMPLEX PO Take 1 capsule by mouth in the morning.   venlafaxine 25 MG tablet Commonly known as: EFFEXOR Take 25 mg by mouth 2 (two) times daily.   vitamin C 1000 MG tablet Take 1,000 mg by mouth in the morning.        Allergies:  Allergies  Allergen Reactions   Codeine     "felt drunk, crazy"    Hydrocodone Other (See Comments)   Hydrocodone-Acetaminophen Other (See Comments)    Confusion/delirium   Lidocaine     Heart racing, increased BP - from local at dentist (likely epi)   Nsaids Other (See Comments)    Avoids due to IBS symptoms (bleeding symptoms)   Oxycodone     Confusion/delirium    Family History: Family History  Problem Relation Age of Onset   Diabetes Mother    Heart disease Mother    Hypertension Mother    Diabetes Son    Diabetes Sister    Inflammatory  bowel disease Father        diverticulitis   Multiple myeloma Father    Hypertension Son    Diabetes Maternal Grandmother    Colon cancer Neg Hx    Esophageal cancer Neg Hx    Rectal cancer Neg Hx    Stomach cancer Neg Hx    Breast cancer Neg Hx     Social History:   reports that she has been smoking cigarettes. She has a 27.5 pack-year smoking history. She has never used smokeless tobacco. She reports current alcohol use of about 2.0 standard drinks of alcohol per week. She reports that she does not use drugs.  Physical Exam: BP 92/64   Pulse 88   Ht 5' (1.524 m)   Wt 99 lb (44.9 kg)   BMI 19.33 kg/m   Constitutional:  Alert and oriented, no acute distress, nontoxic appearing HEENT: [place], AT Cardiovascular: No clubbing, cyanosis, or edema Respiratory: Normal respiratory effort, no increased work of breathing Skin: No rashes, bruises or suspicious lesions Neurologic: Grossly intact, no focal deficits, moving all 4 extremities Psychiatric: Normal mood and affect  Laboratory Data: Results for orders placed or performed in visit on 04/05/23  Microscopic Examination   Urine  Result Value Ref Range   WBC, UA 11-30 (A) 0 - 5 /hpf   RBC, Urine 11-30 (A) 0 - 2 /hpf   Epithelial Cells (non renal) 0-10 0 - 10 /hpf   Casts Present (A) None seen /lpf   Cast Type Granular casts (A) N/A   Mucus, UA Present (A) Not Estab.   Bacteria, UA Moderate (A) None seen/Few  Urinalysis, Complete  Result Value  Ref Range   Specific Gravity, UA 1.025 1.005 - 1.030   pH, UA 6.0 5.0 - 7.5   Color, UA Yellow Yellow   Appearance Ur Clear Clear   Leukocytes,UA 1+ (A) Negative   Protein,UA 2+ (A) Negative/Trace   Glucose, UA Negative Negative   Ketones, UA Negative Negative   RBC, UA 1+ (A) Negative   Bilirubin, UA Negative Negative   Urobilinogen, Ur 0.2 0.2 - 1.0 mg/dL   Nitrite, UA Negative Negative   Microscopic Examination See below:   Bladder Scan (Post Void Residual) in office  Result Value Ref Range   Scan Result 0    Assessment & Plan:   1. Microscopic hematuria Persistent microscopic hematuria.  She is due for CT urogram, will order this now.  With no new irritative voiding symptoms or gross hematuria, will defer repeat cystoscopy unless there are gross abnormalities on CT or if her sleep study is negative as below.  Will also get urine cytology today and proceed with cystoscopy if positive. - Urinalysis, Complete - CT HEMATURIA WORKUP; Future - CULTURE, URINE COMPREHENSIVE - Cytology - Non PAP;  2. Nocturia I continue to recommend sleep study.  Will stop Gemtesa as it was ineffective.  Can consider Botox in the future, however I was honest with her that I think this is unlikely to solve her problem and may cause her urinary hesitancy or straining, whereas getting her on a CPAP for undiagnosed sleep apnea would likely resolve this issue. - Bladder Scan (Post Void Residual) in office   Return for Will call with results.  Carman Ching, PA-C  [facility] [place] [address] [place], [place] [address] [phone]

## 2023-04-05 NOTE — Patient Instructions (Addendum)
Please follow up with Dr. Hyacinth Meeker about getting a sleep study. The imaging department will call you to schedule your CT scan. I'll call you with your results. I've cancelled your appointment with Dr. Apolinar Junes next week since that was an annual appointment to check your urine and we did that today.

## 2023-04-06 ENCOUNTER — Inpatient Hospital Stay: Payer: PPO

## 2023-04-07 ENCOUNTER — Inpatient Hospital Stay: Payer: PPO

## 2023-04-07 VITALS — BP 127/65 | HR 55 | Temp 97.5°F | Resp 16

## 2023-04-07 DIAGNOSIS — D509 Iron deficiency anemia, unspecified: Secondary | ICD-10-CM | POA: Diagnosis not present

## 2023-04-07 DIAGNOSIS — D500 Iron deficiency anemia secondary to blood loss (chronic): Secondary | ICD-10-CM

## 2023-04-07 MED ORDER — SODIUM CHLORIDE 0.9 % IV SOLN
Freq: Once | INTRAVENOUS | Status: AC
  Filled 2023-04-07: qty 250

## 2023-04-07 MED ORDER — SODIUM CHLORIDE 0.9 % IV SOLN
200.0000 mg | Freq: Once | INTRAVENOUS | Status: AC
  Administered 2023-04-07: 200 mg via INTRAVENOUS
  Filled 2023-04-07: qty 200

## 2023-04-07 NOTE — Patient Instructions (Signed)
 Iron Sucrose Injection What is this medication? IRON SUCROSE (EYE ern SOO krose) treats low levels of iron (iron deficiency anemia) in people with kidney disease. Iron is a mineral that plays an important role in making red blood cells, which carry oxygen from your lungs to the rest of your body. This medicine may be used for other purposes; ask your health care provider or pharmacist if you have questions. COMMON BRAND NAME(S): Venofer What should I tell my care team before I take this medication? They need to know if you have any of these conditions: Anemia not caused by low iron levels Heart disease High levels of iron in the blood Kidney disease Liver disease An unusual or allergic reaction to iron, other medications, foods, dyes, or preservatives Pregnant or trying to get pregnant Breastfeeding How should I use this medication? This medication is for infusion into a vein. It is given in a hospital or clinic setting. Talk to your care team about the use of this medication in children. While this medication may be prescribed for children as young as 2 years for selected conditions, precautions do apply. Overdosage: If you think you have taken too much of this medicine contact a poison control center or emergency room at once. NOTE: This medicine is only for you. Do not share this medicine with others. What if I miss a dose? Keep appointments for follow-up doses. It is important not to miss your dose. Call your care team if you are unable to keep an appointment. What may interact with this medication? Do not take this medication with any of the following: Deferoxamine Dimercaprol Other iron products This medication may also interact with the following: Chloramphenicol Deferasirox This list may not describe all possible interactions. Give your health care provider a list of all the medicines, herbs, non-prescription drugs, or dietary supplements you use. Also tell them if you smoke,  drink alcohol, or use illegal drugs. Some items may interact with your medicine. What should I watch for while using this medication? Visit your care team regularly. Tell your care team if your symptoms do not start to get better or if they get worse. You may need blood work done while you are taking this medication. You may need to follow a special diet. Talk to your care team. Foods that contain iron include: whole grains/cereals, dried fruits, beans, or peas, leafy green vegetables, and organ meats (liver, kidney). What side effects may I notice from receiving this medication? Side effects that you should report to your care team as soon as possible: Allergic reactions--skin rash, itching, hives, swelling of the face, lips, tongue, or throat Low blood pressure--dizziness, feeling faint or lightheaded, blurry vision Shortness of breath Side effects that usually do not require medical attention (report to your care team if they continue or are bothersome): Flushing Headache Joint pain Muscle pain Nausea Pain, redness, or irritation at injection site This list may not describe all possible side effects. Call your doctor for medical advice about side effects. You may report side effects to FDA at 1-800-FDA-1088. Where should I keep my medication? This medication is given in a hospital or clinic. It will not be stored at home. NOTE: This sheet is a summary. It may not cover all possible information. If you have questions about this medicine, talk to your doctor, pharmacist, or health care provider.  2024 Elsevier/Gold Standard (2022-12-31 00:00:00)

## 2023-04-12 LAB — CULTURE, URINE COMPREHENSIVE

## 2023-04-13 ENCOUNTER — Inpatient Hospital Stay: Payer: PPO

## 2023-04-13 ENCOUNTER — Ambulatory Visit: Payer: Medicare PPO | Admitting: Urology

## 2023-04-14 ENCOUNTER — Inpatient Hospital Stay: Payer: PPO

## 2023-04-14 ENCOUNTER — Other Ambulatory Visit: Payer: Self-pay

## 2023-04-14 ENCOUNTER — Ambulatory Visit
Admission: RE | Admit: 2023-04-14 | Discharge: 2023-04-14 | Disposition: A | Discharge status: Home / Self Care | Payer: PPO | Attending: Vascular Surgery | Admitting: Vascular Surgery

## 2023-04-14 ENCOUNTER — Encounter: Payer: Self-pay | Admitting: Vascular Surgery

## 2023-04-14 ENCOUNTER — Encounter
Admission: RE | Disposition: A | Discharge status: Home / Self Care | Payer: Self-pay | Source: Home / Self Care | Attending: Vascular Surgery

## 2023-04-14 DIAGNOSIS — R351 Nocturia: Secondary | ICD-10-CM | POA: Insufficient documentation

## 2023-04-14 DIAGNOSIS — I708 Atherosclerosis of other arteries: Secondary | ICD-10-CM | POA: Diagnosis not present

## 2023-04-14 DIAGNOSIS — D509 Iron deficiency anemia, unspecified: Secondary | ICD-10-CM | POA: Diagnosis not present

## 2023-04-14 DIAGNOSIS — E1151 Type 2 diabetes mellitus with diabetic peripheral angiopathy without gangrene: Secondary | ICD-10-CM | POA: Insufficient documentation

## 2023-04-14 DIAGNOSIS — Z833 Family history of diabetes mellitus: Secondary | ICD-10-CM | POA: Insufficient documentation

## 2023-04-14 DIAGNOSIS — I70219 Atherosclerosis of native arteries of extremities with intermittent claudication, unspecified extremity: Secondary | ICD-10-CM

## 2023-04-14 DIAGNOSIS — F17210 Nicotine dependence, cigarettes, uncomplicated: Secondary | ICD-10-CM | POA: Insufficient documentation

## 2023-04-14 DIAGNOSIS — Z8249 Family history of ischemic heart disease and other diseases of the circulatory system: Secondary | ICD-10-CM | POA: Insufficient documentation

## 2023-04-14 DIAGNOSIS — R3129 Other microscopic hematuria: Secondary | ICD-10-CM | POA: Diagnosis not present

## 2023-04-14 DIAGNOSIS — I70212 Atherosclerosis of native arteries of extremities with intermittent claudication, left leg: Secondary | ICD-10-CM | POA: Diagnosis not present

## 2023-04-14 DIAGNOSIS — I700 Atherosclerosis of aorta: Secondary | ICD-10-CM | POA: Diagnosis not present

## 2023-04-14 DIAGNOSIS — I70201 Unspecified atherosclerosis of native arteries of extremities, right leg: Secondary | ICD-10-CM

## 2023-04-14 DIAGNOSIS — I4891 Unspecified atrial fibrillation: Secondary | ICD-10-CM | POA: Diagnosis not present

## 2023-04-14 DIAGNOSIS — E785 Hyperlipidemia, unspecified: Secondary | ICD-10-CM | POA: Diagnosis not present

## 2023-04-14 HISTORY — PX: LOWER EXTREMITY ANGIOGRAPHY: CATH118251

## 2023-04-14 LAB — CREATININE, SERUM
Creatinine, Ser: 0.71 mg/dL (ref 0.44–1.00)
GFR, Estimated: >60 mL/min (ref >60)

## 2023-04-14 LAB — BUN: BUN: 13 mg/dL (ref 8–23)

## 2023-04-14 SURGERY — LOWER EXTREMITY ANGIOGRAPHY
Anesthesia: Moderate Sedation | Site: Leg Lower | Laterality: Left

## 2023-04-14 MED ORDER — ASPIRIN 81 MG PO TBEC
81.0000 mg | DELAYED_RELEASE_TABLET | Freq: Every day | ORAL | 2 refills | Status: AC
Start: 2023-04-14 — End: 2024-04-13

## 2023-04-14 MED ORDER — SODIUM CHLORIDE 0.9 % IV SOLN: INTRAVENOUS | Status: DC

## 2023-04-14 MED ORDER — CEFAZOLIN SODIUM-DEXTROSE 2-4 GM/100ML-% IV SOLN
INTRAVENOUS | Status: AC
  Filled 2023-04-14: qty 100

## 2023-04-14 MED ORDER — DIPHENHYDRAMINE HCL 50 MG/ML IJ SOLN: 50.0000 mg | Freq: Once | INTRAMUSCULAR | Status: DC | PRN

## 2023-04-14 MED ORDER — MIDAZOLAM HCL 2 MG/2ML IJ SOLN
INTRAMUSCULAR | Status: AC
  Filled 2023-04-14: qty 2

## 2023-04-14 MED ORDER — METHYLPREDNISOLONE SODIUM SUCC 125 MG IJ SOLR: 125.0000 mg | Freq: Once | INTRAMUSCULAR | Status: DC | PRN

## 2023-04-14 MED ORDER — CEFAZOLIN SODIUM-DEXTROSE 2-4 GM/100ML-% IV SOLN
2.0000 g | INTRAVENOUS | Status: AC
  Administered 2023-04-14: 2 g via INTRAVENOUS

## 2023-04-14 MED ORDER — HEPARIN (PORCINE) IN NACL 1000-0.9 UT/500ML-% IV SOLN
INTRAVENOUS | Status: DC | PRN
  Administered 2023-04-14: 1000 mL

## 2023-04-14 MED ORDER — MIDAZOLAM HCL 2 MG/2ML IJ SOLN
INTRAMUSCULAR | Status: DC | PRN
  Administered 2023-04-14 (×2): 1 mg via INTRAVENOUS

## 2023-04-14 MED ORDER — CHLOROPROCAINE HCL (PF) 3 % IJ SOLN
INTRAMUSCULAR | Status: DC | PRN
  Administered 2023-04-14: 10 mL

## 2023-04-14 MED ORDER — HEPARIN SODIUM (PORCINE) 1000 UNIT/ML IJ SOLN
INTRAMUSCULAR | Status: AC
  Filled 2023-04-14: qty 10

## 2023-04-14 MED ORDER — LIDOCAINE-EPINEPHRINE (PF) 1 %-1:200000 IJ SOLN
INTRAMUSCULAR | Status: DC | PRN
  Administered 2023-04-14: 10 mL

## 2023-04-14 MED ORDER — FENTANYL CITRATE (PF) 100 MCG/2ML IJ SOLN
INTRAMUSCULAR | Status: AC
  Filled 2023-04-14: qty 2

## 2023-04-14 MED ORDER — MIDAZOLAM HCL 2 MG/ML PO SYRP: 8.0000 mg | ORAL_SOLUTION | Freq: Once | ORAL | Status: DC | PRN

## 2023-04-14 MED ORDER — FAMOTIDINE 20 MG PO TABS: 40.0000 mg | ORAL_TABLET | Freq: Once | ORAL | Status: DC | PRN

## 2023-04-14 MED ORDER — FENTANYL CITRATE (PF) 100 MCG/2ML IJ SOLN
INTRAMUSCULAR | Status: DC | PRN
  Administered 2023-04-14 (×2): 50 ug via INTRAVENOUS

## 2023-04-14 MED ORDER — ONDANSETRON HCL 4 MG/2ML IJ SOLN: 4.0000 mg | Freq: Four times a day (QID) | INTRAMUSCULAR | Status: DC | PRN

## 2023-04-14 MED ORDER — IODIXANOL 320 MG/ML IV SOLN
INTRAVENOUS | Status: DC | PRN
  Administered 2023-04-14: 40 mL

## 2023-04-14 SURGICAL SUPPLY — 8 items
CATH ANGIO 5F PIGTAIL 65CM (CATHETERS) IMPLANT
COVER PROBE ULTRASOUND 5X96 (MISCELLANEOUS) IMPLANT
DEVICE STARCLOSE SE CLOSURE (Vascular Products) IMPLANT
PACK ANGIOGRAPHY (CUSTOM PROCEDURE TRAY) ×1 IMPLANT
SHEATH BRITE TIP 5FRX11 (SHEATH) IMPLANT
SYR MEDRAD MARK 7 150ML (SYRINGE) IMPLANT
TUBING CONTRAST HIGH PRESS 72 (TUBING) IMPLANT
WIRE GUIDERIGHT .035X150 (WIRE) IMPLANT

## 2023-04-14 NOTE — Progress Notes (Signed)
Dr. Wyn Quaker in at bedside, speaking with pt. Re: angiogram this AM. MD made aware of pt. Run of VT. MD states he will have pt. See a Cardiologist before her leg surgery. "I'll have Yolanda Jimenez at the office get her a cardiologist." Pt. Verbalized understanding of conversation with MD.

## 2023-04-14 NOTE — Interval H&P Note (Signed)
History and Physical Interval Note:  04/14/2023 8:09 AM  Yolanda Jimenez  has presented today for surgery, with the diagnosis of LLE Angio   ASO w claudication.  The various methods of treatment have been discussed with the patient and family. After consideration of risks, benefits and other options for treatment, the patient has consented to  Procedure(s): Lower Extremity Angiography (Left) as a surgical intervention.  The patient's history has been reviewed, patient examined, no change in status, stable for surgery.  I have reviewed the patient's chart and labs.  Questions were answered to the patient's satisfaction.     Festus Barren

## 2023-04-14 NOTE — Progress Notes (Signed)
Pt. Had 11 beat run of VT: pt. VSS. Pt. States "I felt something in my chest." Rhythm nonsustained. When asked if she has seen a cardiologist in her past, she states "I saw one a long time ago." Pt. Returned to [facility] with PAC's, then SB right after run of VT.

## 2023-04-14 NOTE — Op Note (Signed)
[facility]  Percutaneous Study/Intervention Procedural Note   Date of Surgery: 04/14/2023  Surgeon(s):Oswin Johal    Assistants:none  Pre-operative Diagnosis: PAD with claudication  Post-operative diagnosis:  Same  Procedure(s) Performed:             1.  Ultrasound guidance for vascular access right femoral artery             2.  Catheter placement into aorta from right femoral approach             3.  Aortogram and selective left lower extremity angiogram             4.  StarClose closure device right femoral artery  EBL: 5 cc  Contrast: 40 cc  Fluoro Time: 0.9 minutes  Moderate Conscious Sedation Time: approximately 18 minutes using 2 mg of Versed and 100 mcg of Fentanyl              Indications:  Patient is a 77 y.o.female with disabling claudication symptoms predominantly in the left leg. The patient has noninvasive study showing markedly reduced left ABI with a near normal right ABI. The patient is brought in for angiography for further evaluation and potential treatment. Risks and benefits are discussed and informed consent is obtained.   Procedure:  The patient was identified and appropriate procedural time out was performed.  The patient was then placed supine on the table and prepped and draped in the usual sterile fashion. Moderate conscious sedation was administered during a face to face encounter with the patient throughout the procedure with my supervision of the RN administering medicines and monitoring the patient's vital signs, pulse oximetry, telemetry and mental status throughout from the start of the procedure until the patient was taken to the recovery room. Ultrasound was used to evaluate the right common femoral artery.  It was patent .  A digital ultrasound image was acquired.  A Seldinger needle was used to access the right common femoral artery under direct ultrasound guidance and a permanent image was performed.  A 0.035 J wire was  advanced without resistance and a 5Fr sheath was placed.  Pigtail catheter was placed into the aorta and an AP aortogram was performed. This demonstrated normal renal arteries.  The aorta and both common iliac arteries were densely calcific.  There was disease in the distal aorta with greater than 50% stenosis, the right common iliac artery had a greater than 75% stenosis, and the left common iliac artery was occluded.  There were extensive lumbar collaterals.  The right external iliac artery normalized but the right common femoral artery had a clearly greater than 75% stenosis.  The left common femoral artery appeared quite diseased as well.  It was fairly clear the patient will need some sort of hybrid procedure with femoral endarterectomies and aortoiliac intervention.  As the left leg was the more severely affected of the 2 legs, I elected to go ahead and image this further downstream for full evaluation with the catheter in the aorta. Selective left lower extremity angiogram was then performed. This demonstrated highly calcific greater than 85% stenosis of the left common femoral artery spilling over into the origin of the SFA.  The remainder of the SFA and popliteal arteries were fairly mildly diseased.  The posterior tibial artery was the dominant runoff to the foot but was quite sluggish.  There appeared to be moderate stenosis in the proximal posterior tibial artery and tibioperoneal trunk in the 50 to 60%  range.  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  I elected to terminate the procedure. The sheath was removed and StarClose closure device was deployed in the right femoral artery with excellent hemostatic result. The patient was taken to the recovery room in stable condition having tolerated the procedure well.  Findings:               Aortogram:  This demonstrated normal renal arteries.  The aorta and both common iliac arteries were densely calcific.  There was disease in the  distal aorta with greater than 50% stenosis, the right common iliac artery had a greater than 75% stenosis, and the left common iliac artery was occluded.  There were extensive lumbar collaterals.  The right external iliac artery normalized but the right common femoral artery had a clearly greater than 75% stenosis.             Left lower Extremity:  This demonstrated highly calcific greater than 85% stenosis of the left common femoral artery spilling over into the origin of the SFA.  The remainder of the SFA and popliteal arteries were fairly mildly diseased.  The posterior tibial artery was the dominant runoff to the foot but was quite sluggish.  There appeared to be moderate stenosis in the proximal posterior tibial artery and tibioperoneal trunk in the 50 to 60% range.  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   Disposition: Patient was taken to the recovery room in stable condition having tolerated the procedure well.  Complications: None  Festus Barren 04/14/2023 8:49 AM   This note was created with Dragon Medical transcription system. Any errors in dictation are purely unintentional.

## 2023-04-15 ENCOUNTER — Encounter: Payer: Self-pay | Admitting: Vascular Surgery

## 2023-04-15 ENCOUNTER — Inpatient Hospital Stay: Payer: PPO | Attending: Internal Medicine

## 2023-04-15 DIAGNOSIS — D509 Iron deficiency anemia, unspecified: Secondary | ICD-10-CM | POA: Insufficient documentation

## 2023-04-18 NOTE — Group Note (Deleted)

## 2023-04-20 ENCOUNTER — Inpatient Hospital Stay: Payer: PPO

## 2023-04-21 ENCOUNTER — Inpatient Hospital Stay: Payer: PPO

## 2023-04-21 VITALS — BP 132/72 | HR 57 | Temp 98.6°F | Resp 16

## 2023-04-21 DIAGNOSIS — M818 Other osteoporosis without current pathological fracture: Secondary | ICD-10-CM | POA: Diagnosis not present

## 2023-04-21 DIAGNOSIS — Z720 Tobacco use: Secondary | ICD-10-CM | POA: Diagnosis not present

## 2023-04-21 DIAGNOSIS — Z23 Encounter for immunization: Secondary | ICD-10-CM | POA: Diagnosis not present

## 2023-04-21 DIAGNOSIS — D509 Iron deficiency anemia, unspecified: Secondary | ICD-10-CM | POA: Diagnosis not present

## 2023-04-21 DIAGNOSIS — D500 Iron deficiency anemia secondary to blood loss (chronic): Secondary | ICD-10-CM

## 2023-04-21 MED ORDER — SODIUM CHLORIDE 0.9 % IV SOLN
Freq: Once | INTRAVENOUS | Status: AC
  Filled 2023-04-21: qty 250

## 2023-04-21 MED ORDER — SODIUM CHLORIDE 0.9 % IV SOLN
200.0000 mg | Freq: Once | INTRAVENOUS | Status: AC
  Administered 2023-04-21: 200 mg via INTRAVENOUS
  Filled 2023-04-21: qty 200

## 2023-04-21 NOTE — Patient Instructions (Signed)
Iron Sucrose Injection What is this medication? IRON SUCROSE (EYE ern SOO krose) treats low levels of iron (iron deficiency anemia) in people with kidney disease. Iron is a mineral that plays an important role in making red blood cells, which carry oxygen from your lungs to the rest of your body. This medicine may be used for other purposes; ask your health care provider or pharmacist if you have questions. COMMON BRAND NAME(S): Venofer What should I tell my care team before I take this medication? They need to know if you have any of these conditions: Anemia not caused by low iron levels Heart disease High levels of iron in the blood Kidney disease Liver disease An unusual or allergic reaction to iron, other medications, foods, dyes, or preservatives Pregnant or trying to get pregnant Breastfeeding How should I use this medication? This medication is for infusion into a vein. It is given in a hospital or clinic setting. Talk to your care team about the use of this medication in children. While this medication may be prescribed for children as young as 2 years for selected conditions, precautions do apply. Overdosage: If you think you have taken too much of this medicine contact a poison control center or emergency room at once. NOTE: This medicine is only for you. Do not share this medicine with others. What if I miss a dose? Keep appointments for follow-up doses. It is important not to miss your dose. Call your care team if you are unable to keep an appointment. What may interact with this medication? Do not take this medication with any of the following: Deferoxamine Dimercaprol Other iron products This medication may also interact with the following: Chloramphenicol Deferasirox This list may not describe all possible interactions. Give your health care provider a list of all the medicines, herbs, non-prescription drugs, or dietary supplements you use. Also tell them if you smoke,  drink alcohol, or use illegal drugs. Some items may interact with your medicine. What should I watch for while using this medication? Visit your care team regularly. Tell your care team if your symptoms do not start to get better or if they get worse. You may need blood work done while you are taking this medication. You may need to follow a special diet. Talk to your care team. Foods that contain iron include: whole grains/cereals, dried fruits, beans, or peas, leafy green vegetables, and organ meats (liver, kidney). What side effects may I notice from receiving this medication? Side effects that you should report to your care team as soon as possible: Allergic reactions--skin rash, itching, hives, swelling of the face, lips, tongue, or throat Low blood pressure--dizziness, feeling faint or lightheaded, blurry vision Shortness of breath Side effects that usually do not require medical attention (report to your care team if they continue or are bothersome): Flushing Headache Joint pain Muscle pain Nausea Pain, redness, or irritation at injection site This list may not describe all possible side effects. Call your doctor for medical advice about side effects. You may report side effects to FDA at 1-800-FDA-1088. Where should I keep my medication? This medication is given in a hospital or clinic. It will not be stored at home. NOTE: This sheet is a summary. It may not cover all possible information. If you have questions about this medicine, talk to your doctor, pharmacist, or health care provider.  2024 Elsevier/Gold Standard (2022-12-31 00:00:00)

## 2023-04-22 ENCOUNTER — Ambulatory Visit
Admission: RE | Admit: 2023-04-22 | Discharge: 2023-04-22 | Disposition: A | Discharge status: Home / Self Care | Payer: PPO | Source: Ambulatory Visit | Attending: Physician Assistant

## 2023-04-22 DIAGNOSIS — R3129 Other microscopic hematuria: Secondary | ICD-10-CM | POA: Diagnosis not present

## 2023-04-22 DIAGNOSIS — K439 Ventral hernia without obstruction or gangrene: Secondary | ICD-10-CM | POA: Diagnosis not present

## 2023-04-22 DIAGNOSIS — N281 Cyst of kidney, acquired: Secondary | ICD-10-CM | POA: Diagnosis not present

## 2023-04-22 DIAGNOSIS — K5730 Diverticulosis of large intestine without perforation or abscess without bleeding: Secondary | ICD-10-CM | POA: Diagnosis not present

## 2023-04-22 MED ORDER — IOHEXOL 300 MG/ML  SOLN
75.0000 mL | Freq: Once | INTRAMUSCULAR | Status: AC | PRN
  Administered 2023-04-22: 75 mL via INTRAVENOUS

## 2023-04-25 DIAGNOSIS — H353221 Exudative age-related macular degeneration, left eye, with active choroidal neovascularization: Secondary | ICD-10-CM | POA: Diagnosis not present

## 2023-04-26 ENCOUNTER — Encounter (INDEPENDENT_AMBULATORY_CARE_PROVIDER_SITE_OTHER): Payer: PPO

## 2023-04-26 ENCOUNTER — Ambulatory Visit (INDEPENDENT_AMBULATORY_CARE_PROVIDER_SITE_OTHER): Payer: PPO | Admitting: Vascular Surgery

## 2023-04-26 DIAGNOSIS — I700 Atherosclerosis of aorta: Secondary | ICD-10-CM | POA: Diagnosis not present

## 2023-04-26 DIAGNOSIS — E782 Mixed hyperlipidemia: Secondary | ICD-10-CM | POA: Diagnosis not present

## 2023-04-26 DIAGNOSIS — R002 Palpitations: Secondary | ICD-10-CM | POA: Diagnosis not present

## 2023-04-26 DIAGNOSIS — I4729 Other ventricular tachycardia: Secondary | ICD-10-CM | POA: Diagnosis not present

## 2023-04-26 DIAGNOSIS — I739 Peripheral vascular disease, unspecified: Secondary | ICD-10-CM | POA: Diagnosis not present

## 2023-04-26 DIAGNOSIS — Z720 Tobacco use: Secondary | ICD-10-CM | POA: Diagnosis not present

## 2023-04-27 ENCOUNTER — Telehealth: Payer: Self-pay

## 2023-04-27 ENCOUNTER — Inpatient Hospital Stay: Payer: PPO

## 2023-04-27 NOTE — Telephone Encounter (Signed)
Patient called and left a message on main VM requesting results of CT scan.

## 2023-04-28 ENCOUNTER — Inpatient Hospital Stay: Payer: PPO

## 2023-04-28 VITALS — BP 113/61 | HR 65 | Resp 16

## 2023-04-28 DIAGNOSIS — D509 Iron deficiency anemia, unspecified: Secondary | ICD-10-CM | POA: Diagnosis not present

## 2023-04-28 DIAGNOSIS — D500 Iron deficiency anemia secondary to blood loss (chronic): Secondary | ICD-10-CM

## 2023-04-28 MED ORDER — SODIUM CHLORIDE 0.9 % IV SOLN
Freq: Once | INTRAVENOUS | Status: AC
  Filled 2023-04-28: qty 250

## 2023-04-28 MED ORDER — SODIUM CHLORIDE 0.9 % IV SOLN
200.0000 mg | Freq: Once | INTRAVENOUS | Status: AC
  Administered 2023-04-28: 200 mg via INTRAVENOUS
  Filled 2023-04-28: qty 200

## 2023-04-28 NOTE — Telephone Encounter (Signed)
Her results aren't back yet, will call when radiology reads them.

## 2023-04-28 NOTE — Telephone Encounter (Signed)
Pt informed, voiced understanding

## 2023-05-03 DIAGNOSIS — I350 Nonrheumatic aortic (valve) stenosis: Secondary | ICD-10-CM

## 2023-05-03 DIAGNOSIS — R008 Other abnormalities of heart beat: Secondary | ICD-10-CM | POA: Diagnosis not present

## 2023-05-03 HISTORY — DX: Nonrheumatic aortic (valve) stenosis: I35.0

## 2023-05-04 DIAGNOSIS — M810 Age-related osteoporosis without current pathological fracture: Secondary | ICD-10-CM | POA: Diagnosis not present

## 2023-05-05 ENCOUNTER — Inpatient Hospital Stay: Payer: PPO

## 2023-05-05 VITALS — BP 119/60 | HR 52 | Temp 98.1°F | Resp 17

## 2023-05-05 DIAGNOSIS — D509 Iron deficiency anemia, unspecified: Secondary | ICD-10-CM | POA: Diagnosis not present

## 2023-05-05 DIAGNOSIS — D500 Iron deficiency anemia secondary to blood loss (chronic): Secondary | ICD-10-CM

## 2023-05-05 MED ORDER — SODIUM CHLORIDE 0.9 % IV SOLN
200.0000 mg | Freq: Once | INTRAVENOUS | Status: AC
  Administered 2023-05-05: 200 mg via INTRAVENOUS
  Filled 2023-05-05: qty 200

## 2023-05-05 MED ORDER — SODIUM CHLORIDE 0.9 % IV SOLN
Freq: Once | INTRAVENOUS | Status: AC
  Filled 2023-05-05: qty 250

## 2023-05-06 ENCOUNTER — Telehealth: Payer: Self-pay | Admitting: Physician Assistant

## 2023-05-06 ENCOUNTER — Ambulatory Visit (INDEPENDENT_AMBULATORY_CARE_PROVIDER_SITE_OTHER): Payer: PPO | Admitting: Vascular Surgery

## 2023-05-06 NOTE — Telephone Encounter (Signed)
Patient called to inquire about her CT results from 04/22/23. I let patient know that results are not in yet, and she will be contacted when final results are in. Please advise patient.

## 2023-05-09 ENCOUNTER — Other Ambulatory Visit: Payer: Self-pay | Admitting: Physician Assistant

## 2023-05-09 ENCOUNTER — Other Ambulatory Visit: Payer: Self-pay | Admitting: Cardiology

## 2023-05-09 DIAGNOSIS — E782 Mixed hyperlipidemia: Secondary | ICD-10-CM

## 2023-05-09 DIAGNOSIS — R9439 Abnormal result of other cardiovascular function study: Secondary | ICD-10-CM

## 2023-05-09 DIAGNOSIS — Z720 Tobacco use: Secondary | ICD-10-CM

## 2023-05-09 DIAGNOSIS — R82998 Other abnormal findings in urine: Secondary | ICD-10-CM

## 2023-05-09 DIAGNOSIS — Z01810 Encounter for preprocedural cardiovascular examination: Secondary | ICD-10-CM

## 2023-05-09 DIAGNOSIS — I4729 Other ventricular tachycardia: Secondary | ICD-10-CM

## 2023-05-09 DIAGNOSIS — I739 Peripheral vascular disease, unspecified: Secondary | ICD-10-CM

## 2023-05-09 NOTE — Progress Notes (Signed)
Please contact the patient and let her know that her CT scan looks good; there were no significant findings to explain the blood in her urine. This is great news!  I also sent her urine for cytology last month, which is a test where they examine the cells within the urine. There were no signs of cancer cells in the sample, which is also great news.  The urine cytology did show some cell clumps in her urine that came from her kidneys, and I'd like to refer her to nephrology for further evaluation of these. In the meantime, her kidney function remains normal so this is not an emergency. I'm putting in the referral now and they will call her to schedule. In the meantime, she should plan to follow up with Dr. Hyacinth Meeker regarding the sleep study I've recommended.

## 2023-05-10 DIAGNOSIS — D500 Iron deficiency anemia secondary to blood loss (chronic): Secondary | ICD-10-CM | POA: Diagnosis not present

## 2023-05-10 DIAGNOSIS — E1151 Type 2 diabetes mellitus with diabetic peripheral angiopathy without gangrene: Secondary | ICD-10-CM | POA: Diagnosis not present

## 2023-05-11 ENCOUNTER — Encounter: Payer: Self-pay | Admitting: Gastroenterology

## 2023-05-11 ENCOUNTER — Ambulatory Visit: Payer: PPO | Admitting: Gastroenterology

## 2023-05-11 ENCOUNTER — Encounter: Payer: Self-pay | Admitting: Internal Medicine

## 2023-05-11 DIAGNOSIS — K580 Irritable bowel syndrome with diarrhea: Secondary | ICD-10-CM

## 2023-05-11 NOTE — Progress Notes (Signed)
Primary Care Physician: Danella Penton, MD  Primary Gastroenterologist:  Dr. Midge Minium  Chief Complaint  Patient presents with   New Patient (Initial Visit)    HPI: Yolanda Jimenez is a 77 y.o. female here with a history of seeing me in 2020 for irritable bowel syndrome with diarrhea predominance.  The patient had a colonoscopy in 2020 with a 15 mm polyp removed and a repeat the following year.  The patient has a history of notable bowel syndrome with diarrhea predominance.  At the time the patient was reporting that she was losing weight and had imaging that did not show any cause for the weight loss.  The patient was 96 pounds when she saw me in 2020 and her most recent weight at her primary care provider's office was 99 pounds. The patient reports that she has questions on whether or not she can take Imodium every day.  She states that she takes 1 pill a day that usually keeps her symptoms under control.  She denies the stool to be foul-smelling or greasy or fatty.  She states that it is most like and she has to flush multiple times not because it floats but because of the cleaning up after herself when she does have the diarrhea.  Past Medical History:  Diagnosis Date   A-fib [facility])    Aortic atherosclerosis (HCC)    Arthritis    back   Asthma    AVM (arteriovenous malformation)    Barrett esophagus    C. difficile diarrhea    Carotid stenosis    Complication of anesthesia    very hard to come out of anesthesia and pt states she gets very" loopy" after surgeries   COPD (chronic obstructive pulmonary disease) (HCC)    no inhalers   Depression    Diabetes mellitus without complication (HCC)    borderline   Diverticulosis    GERD (gastroesophageal reflux disease)    Heart murmur    Hx of adenomatous colonic polyps    Hyperlipidemia    IBS (irritable bowel syndrome)    Insomnia    Iron deficiency anemia    Kidney cysts    hx UTIs   Macular degeneration    left eye    Microscopic hematuria    Osteoporosis    mild   PVD (peripheral vascular disease) (HCC)    Wears dentures    partial lower    Current Outpatient Medications  Medication Sig Dispense Refill   acetaminophen (TYLENOL) 500 MG tablet Take 1,000 mg by mouth every 6 (six) hours as needed.     Ascorbic Acid (VITAMIN C) 1000 MG tablet Take 1,000 mg by mouth in the morning.     aspirin EC 81 MG tablet Take 1 tablet (81 mg total) by mouth daily. Swallow whole. 150 tablet 2   B Complex-C-Folic Acid (STRESS B COMPLEX PO) Take 1 capsule by mouth in the morning.     denosumab (PROLIA) 60 MG/ML SOSY injection Inject 60 mg into the skin every 6 (six) months.     diazepam (VALIUM) 5 MG tablet Take 1 tablet (5 mg total) by mouth daily as needed for anxiety. (Patient taking differently: Take 5 mg by mouth 2 (two) times daily as needed for anxiety.) 10 tablet 0   ferrous gluconate (FERGON) 240 (27 FE) MG tablet Take 240 mg by mouth in the morning.     fluticasone (FLONASE) 50 MCG/ACT nasal spray Place 1 spray into both nostrils  daily as needed for allergies or rhinitis.     loperamide (IMODIUM) 2 MG capsule Take 2 mg by mouth every morning.     Multiple Vitamins-Minerals (PRESERVISION AREDS 2 PO) Take 2 tablets by mouth in the morning.     omeprazole (PRILOSEC) 40 MG capsule Take 40 mg by mouth daily before breakfast.     simvastatin (ZOCOR) 40 MG tablet Take 40 mg by mouth at bedtime.     venlafaxine (EFFEXOR) 25 MG tablet Take 25 mg by mouth 2 (two) times daily.     No current facility-administered medications for this visit.    Allergies as of 05/11/2023 - Review Complete 05/11/2023  Allergen Reaction Noted   Codeine  08/23/2008   Hydrocodone Other (See Comments) 10/14/2022   Hydrocodone-acetaminophen Other (See Comments) 04/25/2015   Lidocaine  09/14/2018   Nsaids Other (See Comments) 05/15/2018   Oxycodone  10/21/2020    ROS:  General: Negative for anorexia, weight loss, fever, chills,  fatigue, weakness. ENT: Negative for hoarseness, difficulty swallowing , nasal congestion. CV: Negative for chest pain, angina, palpitations, dyspnea on exertion, peripheral edema.  Respiratory: Negative for dyspnea at rest, dyspnea on exertion, cough, sputum, wheezing.  GI: See history of present illness. GU:  Negative for dysuria, hematuria, urinary incontinence, urinary frequency, nocturnal urination.  Endo: Negative for unusual weight change.    Physical Examination:   There were no vitals taken for this visit.  General: Well-nourished, well-developed in no acute distress.  Eyes: No icterus. Conjunctivae pink. Neuro: Alert and oriented x 3.  Grossly intact. Skin: Warm and dry, no jaundice.   Psych: Alert and cooperative, normal mood and affect.  Labs:    Imaging Studies: CT HEMATURIA WORKUP  Result Date: 05/08/2023 CLINICAL DATA:  Microscopic hematuria, urinary frequency EXAM: CT ABDOMEN AND PELVIS WITHOUT AND WITH CONTRAST TECHNIQUE: Multidetector CT imaging of the abdomen and pelvis was performed following the standard protocol before and following the bolus administration of intravenous contrast. RADIATION DOSE REDUCTION: This exam was performed according to the departmental dose-optimization program which includes automated exposure control, adjustment of the mA and/or kV according to patient size and/or use of iterative reconstruction technique. CONTRAST:  75mL OMNIPAQUE IOHEXOL 300 MG/ML  SOLN COMPARISON:  CT chest abdomen pelvis, 11/18/2022 FINDINGS: Lower chest: No acute abnormality. Hepatobiliary: No focal liver abnormality is seen. Status post cholecystectomy. Postop biliary dilatation. Pancreas: Unremarkable. No pancreatic ductal dilatation or surrounding inflammatory changes. Spleen: Normal in size without significant abnormality. Adrenals/Urinary Tract: Adrenal glands are unremarkable. Simple, benign left renal cortical cysts, for which no further follow-up or characterization  is required kidneys are otherwise normal, without renal calculi, solid lesion, or hydronephrosis. Assessment somewhat limited by dense metallic streak artifact from left hip arthroplasty. No obvious abnormality of the urinary bladder. Limited opacification of the distal ureters. Within this limitation, no urinary tract filling defect on delayed phase imaging. Stomach/Bowel: Stomach is within normal limits. Appendix appears normal. No evidence of bowel wall thickening, distention, or inflammatory changes. Sigmoid diverticulosis. Vascular/Lymphatic: Severe aortic atherosclerosis. No enlarged abdominal or pelvic lymph nodes. Reproductive: Status post hysterectomy. Other: Fat containing midline epigastric hernia (series 6, image 17). No ascites. Musculoskeletal: No acute or significant osseous findings. Status post left hip total arthroplasty. IMPRESSION: 1. No CT findings to explain hematuria. No evidence of urinary tract calculus, mass, or hydronephrosis. 2. Limited opacification of the distal ureters. Within this limitation, no urinary tract filling defect on delayed phase imaging. 3. Sigmoid diverticulosis without evidence of acute diverticulitis. 4.  Fat containing midline epigastric hernia. Aortic Atherosclerosis (ICD10-I70.0). Electronically Signed   By: Jearld Lesch M.D.   On: 05/08/2023 06:45   PERIPHERAL VASCULAR CATHETERIZATION  Result Date: 04/14/2023 See surgical note for result.   Assessment and Plan:   LUVENA WENTLING is a 77 y.o. y/o female who comes in with IBS with diarrhea predominance.  The patient has maintained her weight and is following up with urology for hematuria and with vascular for some vascular occlusive disease.  The patient has been told that she can take Imodium every day since she is doing well with only 1 tablet/day.  She has been told that the maximum dose is 60 mg a day which would be up to 8 of the 2 mg tablets.  She states that she does not need that much medication to  control her symptoms.  The patient has been explained the plan and agrees with it.     Midge Minium, MD. Clementeen Graham    Note: This dictation was prepared with Dragon dictation along with smaller phrase technology. Any transcriptional errors that result from this process are unintentional.

## 2023-05-16 ENCOUNTER — Ambulatory Visit (INDEPENDENT_AMBULATORY_CARE_PROVIDER_SITE_OTHER): Payer: PPO | Admitting: Nurse Practitioner

## 2023-05-16 ENCOUNTER — Encounter (HOSPITAL_COMMUNITY): Payer: Self-pay

## 2023-05-16 ENCOUNTER — Encounter (INDEPENDENT_AMBULATORY_CARE_PROVIDER_SITE_OTHER): Payer: Self-pay | Admitting: Nurse Practitioner

## 2023-05-16 VITALS — BP 99/67 | HR 66 | Resp 18 | Ht 60.0 in | Wt 103.4 lb

## 2023-05-16 DIAGNOSIS — E782 Mixed hyperlipidemia: Secondary | ICD-10-CM | POA: Diagnosis not present

## 2023-05-16 DIAGNOSIS — I70222 Atherosclerosis of native arteries of extremities with rest pain, left leg: Secondary | ICD-10-CM | POA: Diagnosis not present

## 2023-05-16 DIAGNOSIS — E1169 Type 2 diabetes mellitus with other specified complication: Secondary | ICD-10-CM

## 2023-05-16 DIAGNOSIS — E785 Hyperlipidemia, unspecified: Secondary | ICD-10-CM | POA: Diagnosis not present

## 2023-05-16 DIAGNOSIS — Z720 Tobacco use: Secondary | ICD-10-CM | POA: Diagnosis not present

## 2023-05-16 NOTE — Progress Notes (Signed)
Subjective:    Patient ID: Yolanda Jimenez, female    DOB: 1946/07/10, 77 y.o.   MRN: [id number] Chief Complaint  Patient presents with   Follow-up    3 week follow up    The patient returns to the office for followup and review status post angiogram with intervention on 04/14/2023.   Procedure: Procedure(s) Performed:             1.  Ultrasound guidance for vascular access right femoral artery             2.  Catheter placement into aorta from right femoral approach             3.  Aortogram and selective left lower extremity angiogram             4.  StarClose closure device right femoral artery   The patient notes improvement in the lower extremity symptoms. No interval shortening of the patient's claudication distance but there has been some concern for some development of mild rest pain symptoms.. No new ulcers or wounds have occurred since the last visit.  There have been no significant changes to the patient's overall health care.  She does have an upcoming PET/CT stress test and will visit her cardiology is following.  No documented history of amaurosis fugax or recent TIA symptoms. There are no recent neurological changes noted. No documented history of DVT, PE or superficial thrombophlebitis. The patient denies recent episodes of angina or shortness of breath.   The patient's angiogram noted, the aorta and both common iliac arteries were densely calcific.  There was disease in the distal aorta with greater than 50% stenosis, the right common iliac artery had a greater than 75% stenosis, and the left common iliac artery was occluded.  There were extensive lumbar collaterals.  The right external iliac artery normalized but the right common femoral artery had a clearly greater than 75% stenosis.  The left common femoral artery appeared quite diseased as well.  It was fairly clear the patient will need some sort of hybrid procedure with femoral endarterectomies and aortoiliac  intervention.  The left lower extremity also noted that the stenosis in the common femoral artery also spilled into the left SFA.    Review of Systems  Cardiovascular:  Negative for leg swelling.  All other systems reviewed and are negative.      Objective:   Physical Exam Vitals reviewed.  HENT:     Head: Normocephalic.  Cardiovascular:     Rate and Rhythm: Normal rate.  Pulmonary:     Effort: Pulmonary effort is normal.  Skin:    General: Skin is warm and dry.  Neurological:     Mental Status: She is alert and oriented to person, place, and time.  Psychiatric:        Mood and Affect: Mood normal.        Behavior: Behavior normal.        Thought Content: Thought content normal.        Judgment: Judgment normal.     BP 99/67 (BP Location: Left Arm)   Pulse 66   Resp 18   Ht 5' (1.524 m)   Wt 103 lb 6.4 oz (46.9 kg)   BMI 20.19 kg/m   Past Medical History:  Diagnosis Date   A-fib (HCC)    Aortic atherosclerosis (HCC)    Arthritis    back   Asthma    AVM (arteriovenous malformation)  Barrett esophagus    C. difficile diarrhea    Carotid stenosis    Complication of anesthesia    very hard to come out of anesthesia and pt states she gets very" loopy" after surgeries   COPD (chronic obstructive pulmonary disease) (HCC)    no inhalers   Depression    Diabetes mellitus without complication (HCC)    borderline   Diverticulosis    GERD (gastroesophageal reflux disease)    Heart murmur    Hx of adenomatous colonic polyps    Hyperlipidemia    IBS (irritable bowel syndrome)    Insomnia    Iron deficiency anemia    Kidney cysts    hx UTIs   Macular degeneration    left eye   Microscopic hematuria    Osteoporosis    mild   PVD (peripheral vascular disease) (HCC)    Wears dentures    partial lower    Social History   Socioeconomic History   Marital status: Single    Spouse name: Not on file   Number of children: 2   Years of education: Not on file    Highest education level: Not on file  Occupational History   Occupation: Retired  Tobacco Use   Smoking status: Every Day    Current packs/day: 0.50    Average packs/day: 0.5 packs/day for 55.0 years (27.5 ttl pk-yrs)    Types: Cigarettes   Smokeless tobacco: Never   Tobacco comments:    she has patches to start just not started yet. smoked since teenager.  Vaping Use   Vaping status: Never Used  Substance and Sexual Activity   Alcohol use: Yes    Alcohol/week: 2.0 standard drinks of alcohol    Types: 2 Glasses of wine per week    Comment: occ wine 1-2 weekly   Drug use: No   Sexual activity: Not on file  Other Topics Concern   Not on file  Social History Narrative   Independent at baseline.  Lives at home with her husband   Social Determinants of Health   Financial Resource Strain: Low Risk  (11/09/2022)   [facility], [facility]   Overall Financial Resource Strain (CARDIA)    Difficulty of Paying Living Expenses: Not hard at all  Food Insecurity: No Food Insecurity (12/23/2022)   Hunger Vital Sign    Worried About Running Out of Food in the Last Year: Never true    Ran Out of Food in the Last Year: Never true  Transportation Needs: No Transportation Needs (12/23/2022)   PRAPARE - Administrator, Civil Service (Medical): No    Lack of Transportation (Non-Medical): No  Physical Activity: Not on file  Stress: Not on file  Social Connections: Not on file  Intimate Partner Violence: Not At Risk (12/23/2022)   Humiliation, Afraid, Rape, and Kick questionnaire    Fear of Current or Ex-Partner: No    Emotionally Abused: No    Physically Abused: No    Sexually Abused: No    Past Surgical History:  Procedure Laterality Date   BUNIONECTOMY     right foot   CATARACT EXTRACTION     both eyes   CERVICAL CONE BIOPSY     CHOLECYSTECTOMY     COLONOSCOPY     COLONOSCOPY WITH PROPOFOL N/A 01/22/2019   Procedure:  COLONOSCOPY WITH BIOPSY;  Surgeon: Midge Minium, MD;  Location: [facility];  Service: Endoscopy;  Laterality: N/A;  diabetic - diet controlled   COLONOSCOPY WITH PROPOFOL N/A 06/27/2020   Procedure: COLONOSCOPY WITH BIOPSY;  Surgeon: Midge Minium, MD;  Location: [facility];  Service: Endoscopy;  Laterality: N/A;  priority 4   ESOPHAGOGASTRODUODENOSCOPY (EGD) WITH PROPOFOL N/A 08/29/2017   Procedure: ESOPHAGOGASTRODUODENOSCOPY (EGD) WITH PROPOFOL;  Surgeon: Midge Minium, MD;  Location: [facility];  Service: Endoscopy;  Laterality: N/A;   FOOT SURGERY     right 2nd toe   HIP ARTHROPLASTY Left 05/16/2018   Procedure: ARTHROPLASTY BIPOLAR HIP (HEMIARTHROPLASTY);  Surgeon: Christena Flake, MD;  Location: [facility];  Service: Orthopedics;  Laterality: Left;   LOWER EXTREMITY ANGIOGRAPHY Left 04/14/2023   Procedure: Lower Extremity Angiography;  Surgeon: Annice Needy, MD;  Location: [facility];  Service: Cardiovascular;  Laterality: Left;   ORIF PERIPROSTHETIC FRACTURE Left 12/10/2021   Procedure: OPEN REDUCTION INTERNAL FIXATION OF A LEFT GREATER TROCHANTERIC FRACTURE;  Surgeon: Christena Flake, MD;  Location: [facility];  Service: Orthopedics;  Laterality: Left;   ORIF WRIST FRACTURE Right 10/07/2020   Procedure: OPEN REDUCTION INTERNAL FIXATION (ORIF) WRIST FRACTURE;  Surgeon: Kennedy Bucker, MD;  Location: [facility];  Service: Orthopedics;  Laterality: Right;   POLYPECTOMY N/A 01/22/2019   Procedure: POLYPECTOMY;  Surgeon: Midge Minium, MD;  Location: [facility];  Service: Endoscopy;  Laterality: N/A;  2 Clips placed at polyp removal site in ascending colon   POLYPECTOMY N/A 06/27/2020   Procedure: POLYPECTOMY;  Surgeon: Midge Minium, MD;  Location: [facility];  Service: Endoscopy;  Laterality: N/A;   TUBAL LIGATION     UPPER GASTROINTESTINAL ENDOSCOPY      Family History  Problem Relation Age of Onset   Diabetes Mother    Heart disease Mother     Hypertension Mother    Diabetes Son    Diabetes Sister    Inflammatory bowel disease Father        diverticulitis   Multiple myeloma Father    Hypertension Son    Diabetes Maternal Grandmother    Colon cancer Neg Hx    Esophageal cancer Neg Hx    Rectal cancer Neg Hx    Stomach cancer Neg Hx    Breast cancer Neg Hx     Allergies  Allergen Reactions   Codeine     "felt drunk, crazy"   Hydrocodone Other (See Comments)   Hydrocodone-Acetaminophen Other (See Comments)    Confusion/delirium   Lidocaine     Heart racing, increased BP - from local at dentist (likely epi)   Nsaids Other (See Comments)    Avoids due to IBS symptoms (bleeding symptoms)   Oxycodone     Confusion/delirium       Latest Ref Rng & Units 03/25/2023    9:51 AM 12/12/2021   11:50 PM 12/12/2021    4:10 AM  CBC  WBC 4.0 - 10.5 K/uL 7.7  7.8  8.1   Hemoglobin 12.0 - 15.0 g/dL 16.1  9.2  9.2   Hematocrit 36.0 - 46.0 % 35.5  28.3  27.8   Platelets 150 - 400 K/uL 435  317  287       CMP     Component Value Date/Time   NA 136 12/12/2021 2350   NA 142 07/12/2014 2035   K 3.7 12/12/2021 2350   K 3.4 (L) 07/12/2014 2035   CL 104 12/12/2021 2350   CL 107 07/12/2014 2035   CO2 26 12/12/2021 2350   CO2 30 07/12/2014 2035  GLUCOSE 123 (H) 12/12/2021 2350   GLUCOSE 133 (H) 07/12/2014 2035   BUN 13 04/14/2023 0713   BUN 11 07/12/2014 2035   CREATININE 0.71 04/14/2023 0713   CREATININE 0.73 07/12/2014 2035   CALCIUM 8.3 (L) 12/12/2021 2350   CALCIUM 8.3 (L) 07/12/2014 2035   PROT 7.1 05/15/2018 1415   ALBUMIN 4.2 05/15/2018 1415   AST 25 05/15/2018 1415   ALT 16 05/15/2018 1415   ALKPHOS 62 05/15/2018 1415   BILITOT 0.6 05/15/2018 1415   GFRNONAA >60 04/14/2023 0713   GFRNONAA >60 07/12/2014 2035     VAS [place] ABI WITH/WO TBI  Result Date: 03/29/2023  LOWER EXTREMITY DOPPLER STUDY Patient Name:  THALIA TURKINGTON  Date of Exam:   03/23/2023 Medical Rec #: [id number]       Accession #:    [id number] Date  of Birth: 12/14/1945       Patient Gender: F Patient Age:   39 years Exam Location:  [facility] Procedure:      VAS [place] ABI WITH/WO TBI Referring Phys: --------------------------------------------------------------------------------  Indications: Claudication, and peripheral artery disease. High Risk Factors: Hyperlipidemia, Diabetes, current smoker.  Performing Technologist: Hardie Lora RVT  Examination Guidelines: A complete evaluation includes at minimum, Doppler waveform signals and systolic blood pressure reading at the level of bilateral brachial, anterior tibial, and posterior tibial arteries, when vessel segments are accessible. Bilateral testing is considered an integral part of a complete examination. Photoelectric Plethysmograph (PPG) waveforms and toe systolic pressure readings are included as required and additional duplex testing as needed. Limited examinations for reoccurring indications may be performed as noted.  ABI Findings: +---------+------------------+-----+---------+-------+ Right    Rt Pressure (mmHg)IndexWaveform Comment +---------+------------------+-----+---------+-------+ Brachial 145                                     +---------+------------------+-----+---------+-------+ PTA      150               0.99 triphasic        +---------+------------------+-----+---------+-------+ DP       149               0.98 triphasic        +---------+------------------+-----+---------+-------+ Great Toe57                0.38                  +---------+------------------+-----+---------+-------+ +---------+------------------+-----+----------+-------+ Left     Lt Pressure (mmHg)IndexWaveform  Comment +---------+------------------+-----+----------+-------+ Brachial 152                                      +---------+------------------+-----+----------+-------+ PTA      84                0.55 monophasic         +---------+------------------+-----+----------+-------+ DP       93                0.61 monophasic        +---------+------------------+-----+----------+-------+ Great Toe7                 0.05                   +---------+------------------+-----+----------+-------+ +-------+-----------+-----------+------------+------------+ ABI/TBIToday's ABIToday's TBIPrevious ABIPrevious TBI +-------+-----------+-----------+------------+------------+ Right  0.99  0.38       1.05        0.90         +-------+-----------+-----------+------------+------------+ Left   0.61       0.05       0.71        0.24         +-------+-----------+-----------+------------+------------+  Right ABIs appear essentially unchanged compared to prior study on 10/22/2022. Compared to prior study on 10/22/2022.  Summary: Right: Resting right ankle-brachial index is within normal range. The right toe-brachial index is abnormal. Left: Resting left ankle-brachial index indicates moderate left lower extremity arterial disease. The left toe-brachial index is abnormal. *See table(s) above for measurements and observations.  Electronically signed by Festus Barren MD on 03/29/2023 at 10:31:01 AM.    Final        Assessment & Plan:   1. Atherosclerosis of native artery of left lower extremity with rest pain (HCC)  Recommend:  The patient has evidence of severe atherosclerotic changes of both lower extremities associated with ulceration and tissue loss of the left foot.  This represents a limb threatening ischemia and places the patient at a high risk for limb loss.  Angiography has been performed and the situation is not ideal for intervention.  Given this finding open surgical repair is recommended.   Patient should undergo arterial reconstruction, including bilateral femoral endarterectomies with placement of Endologix stent graft for treatment of the aorta iliac level disease.  If the left iliac level occlusion could  not be crossed, the patient will need right iliac stents with a femorofemoral bypass.  The risks and benefits as well as the alternative therapies was discussed in detail with the patient.  All questions were answered.  Patient agrees to proceed with open vascular surgical reconstruction.  The patient will follow up with me in the office after the procedure.    2. DM type 2 with diabetic mixed hyperlipidemia (HCC) Continue hypoglycemic medications as already ordered, these medications have been reviewed and there are no changes at this time.  Hgb A1C to be monitored as already arranged by primary service  3. Tobacco abuse Smoking cessation was discussed, 3-10 minutes spent on this topic specifically  4. Hyperlipidemia, unspecified hyperlipidemia type Continue statin as ordered and reviewed, no changes at this time   Current Outpatient Medications on File Prior to Visit  Medication Sig Dispense Refill   acetaminophen (TYLENOL) 500 MG tablet Take 1,000 mg by mouth every 6 (six) hours as needed.     Ascorbic Acid (VITAMIN C) 1000 MG tablet Take 1,000 mg by mouth in the morning.     aspirin EC 81 MG tablet Take 1 tablet (81 mg total) by mouth daily. Swallow whole. 150 tablet 2   B Complex-C-Folic Acid (STRESS B COMPLEX PO) Take 1 capsule by mouth in the morning.     denosumab (PROLIA) 60 MG/ML SOSY injection Inject 60 mg into the skin every 6 (six) months.     diazepam (VALIUM) 5 MG tablet Take 1 tablet (5 mg total) by mouth daily as needed for anxiety. (Patient taking differently: Take 5 mg by mouth 2 (two) times daily as needed for anxiety.) 10 tablet 0   ferrous gluconate (FERGON) 240 (27 FE) MG tablet Take 240 mg by mouth in the morning.     fluticasone (FLONASE) 50 MCG/ACT nasal spray Place 1 spray into both nostrils daily as needed for allergies or rhinitis.     loperamide (IMODIUM) 2 MG capsule Take  2 mg by mouth every morning.     metoprolol succinate (TOPROL-XL) 25 MG 24 hr tablet  Take 1 tablet by mouth daily.     Multiple Vitamins-Minerals (PRESERVISION AREDS 2 PO) Take 2 tablets by mouth in the morning.     omeprazole (PRILOSEC) 40 MG capsule Take 40 mg by mouth daily before breakfast.     venlafaxine (EFFEXOR) 25 MG tablet Take 25 mg by mouth 2 (two) times daily.     simvastatin (ZOCOR) 40 MG tablet Take 40 mg by mouth at bedtime. (Patient not taking: Reported on 05/16/2023)     No current facility-administered medications on file prior to visit.    There are no Patient Instructions on file for this visit. No follow-ups on file.   Georgiana Spinner, NP

## 2023-05-17 ENCOUNTER — Telehealth (HOSPITAL_COMMUNITY): Payer: Self-pay | Admitting: *Deleted

## 2023-05-17 DIAGNOSIS — E1151 Type 2 diabetes mellitus with diabetic peripheral angiopathy without gangrene: Secondary | ICD-10-CM | POA: Diagnosis not present

## 2023-05-17 DIAGNOSIS — E782 Mixed hyperlipidemia: Secondary | ICD-10-CM | POA: Diagnosis not present

## 2023-05-17 DIAGNOSIS — I739 Peripheral vascular disease, unspecified: Secondary | ICD-10-CM | POA: Diagnosis not present

## 2023-05-17 DIAGNOSIS — R7989 Other specified abnormal findings of blood chemistry: Secondary | ICD-10-CM | POA: Diagnosis not present

## 2023-05-17 NOTE — Telephone Encounter (Signed)
Reaching out to patient to offer assistance regarding upcoming cardiac imaging study; pt verbalizes understanding of appt date/time, parking situation and where to check in, pre-test NPO status, and verified current allergies; name and call back number provided for further questions should they arise  Yolanda Brick RN Navigator Cardiac Imaging [facility] [phone] office [phone] cell  Patient aware to avoid caffeine and metoprolol prior to her cardiac PET scan.

## 2023-05-19 ENCOUNTER — Other Ambulatory Visit: Payer: Self-pay | Admitting: Student

## 2023-05-19 ENCOUNTER — Ambulatory Visit
Admission: RE | Admit: 2023-05-19 | Discharge: 2023-05-19 | Disposition: A | Discharge status: Home / Self Care | Payer: PPO | Source: Ambulatory Visit | Attending: Cardiology | Admitting: Cardiology

## 2023-05-19 DIAGNOSIS — I700 Atherosclerosis of aorta: Secondary | ICD-10-CM | POA: Diagnosis not present

## 2023-05-19 DIAGNOSIS — R9430 Abnormal result of cardiovascular function study, unspecified: Secondary | ICD-10-CM

## 2023-05-19 DIAGNOSIS — E782 Mixed hyperlipidemia: Secondary | ICD-10-CM | POA: Diagnosis not present

## 2023-05-19 DIAGNOSIS — I4729 Other ventricular tachycardia: Secondary | ICD-10-CM | POA: Diagnosis not present

## 2023-05-19 DIAGNOSIS — Z01810 Encounter for preprocedural cardiovascular examination: Secondary | ICD-10-CM | POA: Diagnosis not present

## 2023-05-19 DIAGNOSIS — R9439 Abnormal result of other cardiovascular function study: Secondary | ICD-10-CM | POA: Diagnosis not present

## 2023-05-19 DIAGNOSIS — I739 Peripheral vascular disease, unspecified: Secondary | ICD-10-CM

## 2023-05-19 DIAGNOSIS — I2510 Atherosclerotic heart disease of native coronary artery without angina pectoris: Secondary | ICD-10-CM | POA: Diagnosis not present

## 2023-05-19 DIAGNOSIS — Z720 Tobacco use: Secondary | ICD-10-CM

## 2023-05-19 DIAGNOSIS — J9811 Atelectasis: Secondary | ICD-10-CM | POA: Diagnosis not present

## 2023-05-19 DIAGNOSIS — E278 Other specified disorders of adrenal gland: Secondary | ICD-10-CM | POA: Diagnosis not present

## 2023-05-19 LAB — NM PET CT CARDIAC PERFUSION MULTI W/ABSOLUTE BLOODFLOW
MBFR: 1.89
Nuc Rest EF: 71 %
Nuc Stress EF: 77 %
Peak HR: 82 {beats}/min
Rest HR: 56 {beats}/min
Rest MBF: 0.99 ml/g/min
Rest Nuclear Isotope Dose: 12.3 mCi
SRS: 0
SSS: 0
ST Depression (mm): 0 mm
Stress MBF: 1.87 ml/g/min
Stress Nuclear Isotope Dose: 12.2 mCi
TID: 0.91

## 2023-05-19 MED ORDER — REGADENOSON 0.4 MG/5ML IV SOLN
INTRAVENOUS | Status: AC
  Filled 2023-05-19: qty 5

## 2023-05-19 MED ORDER — RUBIDIUM RB82 GENERATOR (RUBYFILL)
25.0000 | PACK | Freq: Once | INTRAVENOUS | Status: AC
  Administered 2023-05-19: 12.24 via INTRAVENOUS

## 2023-05-19 MED ORDER — REGADENOSON 0.4 MG/5ML IV SOLN
0.4000 mg | Freq: Once | INTRAVENOUS | Status: AC
  Administered 2023-05-19: 0.4 mg via INTRAVENOUS
  Filled 2023-05-19: qty 5

## 2023-05-19 MED ORDER — RUBIDIUM RB82 GENERATOR (RUBYFILL)
25.0000 | PACK | Freq: Once | INTRAVENOUS | Status: AC
  Administered 2023-05-19: 12.3 via INTRAVENOUS

## 2023-05-19 NOTE — Progress Notes (Signed)
Patient presents for a cardiac PET stress test and tolerated procedure with some minor nausea. Patient maintained acceptable vital signs throughout the test and was offered caffeine after test.  Patient ambulated out of department with a steady gait.

## 2023-05-25 DIAGNOSIS — Z720 Tobacco use: Secondary | ICD-10-CM | POA: Diagnosis not present

## 2023-05-25 DIAGNOSIS — I2510 Atherosclerotic heart disease of native coronary artery without angina pectoris: Secondary | ICD-10-CM | POA: Diagnosis not present

## 2023-05-25 DIAGNOSIS — J449 Chronic obstructive pulmonary disease, unspecified: Secondary | ICD-10-CM | POA: Diagnosis not present

## 2023-05-25 DIAGNOSIS — R002 Palpitations: Secondary | ICD-10-CM | POA: Diagnosis not present

## 2023-05-25 DIAGNOSIS — I700 Atherosclerosis of aorta: Secondary | ICD-10-CM | POA: Diagnosis not present

## 2023-05-25 DIAGNOSIS — E782 Mixed hyperlipidemia: Secondary | ICD-10-CM | POA: Diagnosis not present

## 2023-05-25 DIAGNOSIS — I739 Peripheral vascular disease, unspecified: Secondary | ICD-10-CM | POA: Diagnosis not present

## 2023-05-26 ENCOUNTER — Telehealth (INDEPENDENT_AMBULATORY_CARE_PROVIDER_SITE_OTHER): Payer: Self-pay

## 2023-05-26 NOTE — Telephone Encounter (Signed)
Spoke with the patient to schedule her for a bilateral femoral endarterectomy and endologix stent graft with Dr. Wyn Quaker. Patient stated she wanted to speak with him before scheduling because she has a lot of questions. Patient was transferred to front desk and advised that if no one answered to leave a message for a callback. A message was also sent in [facility] chat as well.

## 2023-06-14 ENCOUNTER — Ambulatory Visit (INDEPENDENT_AMBULATORY_CARE_PROVIDER_SITE_OTHER): Payer: PPO | Admitting: Vascular Surgery

## 2023-06-14 ENCOUNTER — Telehealth (INDEPENDENT_AMBULATORY_CARE_PROVIDER_SITE_OTHER): Payer: Self-pay

## 2023-06-14 ENCOUNTER — Encounter (INDEPENDENT_AMBULATORY_CARE_PROVIDER_SITE_OTHER): Payer: Self-pay | Admitting: Vascular Surgery

## 2023-06-14 VITALS — BP 125/76 | HR 59 | Resp 15 | Wt 104.0 lb

## 2023-06-14 DIAGNOSIS — E785 Hyperlipidemia, unspecified: Secondary | ICD-10-CM | POA: Diagnosis not present

## 2023-06-14 DIAGNOSIS — I70212 Atherosclerosis of native arteries of extremities with intermittent claudication, left leg: Secondary | ICD-10-CM

## 2023-06-14 DIAGNOSIS — I6523 Occlusion and stenosis of bilateral carotid arteries: Secondary | ICD-10-CM | POA: Diagnosis not present

## 2023-06-14 DIAGNOSIS — E1151 Type 2 diabetes mellitus with diabetic peripheral angiopathy without gangrene: Secondary | ICD-10-CM | POA: Diagnosis not present

## 2023-06-14 DIAGNOSIS — Z720 Tobacco use: Secondary | ICD-10-CM

## 2023-06-14 MED ORDER — CHANTIX 0.5 MG PO TABS: ORAL_TABLET | ORAL | 3 refills | Status: DC

## 2023-06-14 NOTE — Assessment & Plan Note (Signed)
Her claudication symptoms have significantly improved over the past 2 months with increasing her walking and exercise.  She still is having significant symptoms but not nearly to the severity that she was 2 to 3 months ago.  She does not have rest pain or ulceration.  She would like to hold off on surgery and at this time I find that to be reasonable.  She does not have immediate limb threatening situation.  I have strongly recommended she quit smoking and that would give her the best chance of avoiding intervention.  She is going to try Chantix and we talked about smoking cessation for a long time today.  She will return in about 3 months with ABIs.  She will contact our office if she develops limb threatening symptoms of rest pain or ulceration.

## 2023-06-14 NOTE — Assessment & Plan Note (Signed)
We had a discussion for approximately 5 minutes regarding the absolute need for smoking cessation due to the deleterious nature of tobacco on the vascular system. We discussed the tobacco use would diminish patency of any intervention, and likely significantly worsen progressio of disease. We discussed multiple agents for quitting including replacement therapy or medications to reduce cravings such as Chantix. The patient voices their understanding of the importance of smoking cessation.  

## 2023-06-14 NOTE — Addendum Note (Signed)
Addended by: Annice Needy on: 06/14/2023 01:42 PM   Modules accepted: Orders

## 2023-06-14 NOTE — Telephone Encounter (Signed)
Patient's surgery has been canceled per patient.

## 2023-06-14 NOTE — Addendum Note (Signed)
Addended by: Annice Needy on: 06/14/2023 01:43 PM   Modules accepted: Orders

## 2023-06-14 NOTE — Progress Notes (Signed)
MRN : [id number]  Yolanda Jimenez is a 77 y.o. ([birth year]) female who presents with chief complaint of  Chief Complaint  Patient presents with   Follow-up    Discuss surgery  .  History of Present Illness: Patient returns today in follow up of her severe peripheral arterial disease.  She had an angiogram which showed severe aortoiliac and bilateral common femoral disease and we have been discussing femoral endarterectomy with aortoiliac intervention.  She is here to discuss this today.  She is now on aspirin.  Her symptoms have significantly improved.  She reports no ulceration or infection.  She is not currently having rest pain.  She went from not being able to walk more than about 15 feet to now being able to walk about 100 feet.  She is still smoking.  She does not want to have surgery unless it is absolutely essential.  Current Outpatient Medications  Medication Sig Dispense Refill   acetaminophen (TYLENOL) 500 MG tablet Take 1,000 mg by mouth every 6 (six) hours as needed.     Ascorbic Acid (VITAMIN C) 1000 MG tablet Take 1,000 mg by mouth in the morning.     aspirin EC 81 MG tablet Take 1 tablet (81 mg total) by mouth daily. Swallow whole. 150 tablet 2   B Complex-C-Folic Acid (STRESS B COMPLEX PO) Take 1 capsule by mouth in the morning.     denosumab (PROLIA) 60 MG/ML SOSY injection Inject 60 mg into the skin every 6 (six) months.     diazepam (VALIUM) 5 MG tablet Take 1 tablet (5 mg total) by mouth daily as needed for anxiety. (Patient taking differently: Take 5 mg by mouth 2 (two) times daily as needed for anxiety.) 10 tablet 0   ferrous gluconate (FERGON) 240 (27 FE) MG tablet Take 240 mg by mouth in the morning.     fluticasone (FLONASE) 50 MCG/ACT nasal spray Place 1 spray into both nostrils daily as needed for allergies or rhinitis.     loperamide (IMODIUM) 2 MG capsule Take 2 mg by mouth every morning.     metoprolol succinate (TOPROL-XL) 25 MG 24 hr tablet Take 1 tablet by  mouth daily.     Multiple Vitamins-Minerals (PRESERVISION AREDS 2 PO) Take 2 tablets by mouth in the morning.     omeprazole (PRILOSEC) 40 MG capsule Take 40 mg by mouth daily before breakfast.     venlafaxine (EFFEXOR) 25 MG tablet Take 25 mg by mouth 2 (two) times daily.     simvastatin (ZOCOR) 40 MG tablet Take 40 mg by mouth at bedtime. (Patient not taking: Reported on 05/16/2023)     No current facility-administered medications for this visit.    Past Medical History:  Diagnosis Date   A-fib (HCC)    Aortic atherosclerosis (HCC)    Arthritis    back   Asthma    AVM (arteriovenous malformation)    Barrett esophagus    C. difficile diarrhea    Carotid stenosis    Complication of anesthesia    very hard to come out of anesthesia and pt states she gets very" loopy" after surgeries   COPD (chronic obstructive pulmonary disease) (HCC)    no inhalers   Depression    Diabetes mellitus without complication (HCC)    borderline   Diverticulosis    GERD (gastroesophageal reflux disease)    Heart murmur    Hx of adenomatous colonic polyps    Hyperlipidemia    IBS (  irritable bowel syndrome)    Insomnia    Iron deficiency anemia    Kidney cysts    hx UTIs   Macular degeneration    left eye   Microscopic hematuria    Osteoporosis    mild   PVD (peripheral vascular disease) (HCC)    Wears dentures    partial lower    Past Surgical History:  Procedure Laterality Date   BUNIONECTOMY     right foot   CATARACT EXTRACTION     both eyes   CERVICAL CONE BIOPSY     CHOLECYSTECTOMY     COLONOSCOPY     COLONOSCOPY WITH PROPOFOL N/A 01/22/2019   Procedure: COLONOSCOPY WITH BIOPSY;  Surgeon: Midge Minium, MD;  Location: [facility];  Service: Endoscopy;  Laterality: N/A;  diabetic - diet controlled   COLONOSCOPY WITH PROPOFOL N/A 06/27/2020   Procedure: COLONOSCOPY WITH BIOPSY;  Surgeon: Midge Minium, MD;  Location: [facility];  Service: Endoscopy;  Laterality: N/A;   priority 4   ESOPHAGOGASTRODUODENOSCOPY (EGD) WITH PROPOFOL N/A 08/29/2017   Procedure: ESOPHAGOGASTRODUODENOSCOPY (EGD) WITH PROPOFOL;  Surgeon: Midge Minium, MD;  Location: [facility];  Service: Endoscopy;  Laterality: N/A;   FOOT SURGERY     right 2nd toe   HIP ARTHROPLASTY Left 05/16/2018   Procedure: ARTHROPLASTY BIPOLAR HIP (HEMIARTHROPLASTY);  Surgeon: Christena Flake, MD;  Location: [facility];  Service: Orthopedics;  Laterality: Left;   LOWER EXTREMITY ANGIOGRAPHY Left 04/14/2023   Procedure: Lower Extremity Angiography;  Surgeon: Annice Needy, MD;  Location: [facility];  Service: Cardiovascular;  Laterality: Left;   ORIF PERIPROSTHETIC FRACTURE Left 12/10/2021   Procedure: OPEN REDUCTION INTERNAL FIXATION OF A LEFT GREATER TROCHANTERIC FRACTURE;  Surgeon: Christena Flake, MD;  Location: [facility];  Service: Orthopedics;  Laterality: Left;   ORIF WRIST FRACTURE Right 10/07/2020   Procedure: OPEN REDUCTION INTERNAL FIXATION (ORIF) WRIST FRACTURE;  Surgeon: Kennedy Bucker, MD;  Location: [facility];  Service: Orthopedics;  Laterality: Right;   POLYPECTOMY N/A 01/22/2019   Procedure: POLYPECTOMY;  Surgeon: Midge Minium, MD;  Location: [facility];  Service: Endoscopy;  Laterality: N/A;  2 Clips placed at polyp removal site in ascending colon   POLYPECTOMY N/A 06/27/2020   Procedure: POLYPECTOMY;  Surgeon: Midge Minium, MD;  Location: [facility];  Service: Endoscopy;  Laterality: N/A;   TUBAL LIGATION     UPPER GASTROINTESTINAL ENDOSCOPY       Social History   Tobacco Use   Smoking status: Every Day    Current packs/day: 0.50    Average packs/day: 0.5 packs/day for 55.0 years (27.5 ttl pk-yrs)    Types: Cigarettes   Smokeless tobacco: Never   Tobacco comments:    she has patches to start just not started yet. smoked since teenager.  Vaping Use   Vaping status: Never Used  Substance Use Topics   Alcohol use: Yes    Alcohol/week: 2.0 standard drinks of alcohol     Types: 2 Glasses of wine per week    Comment: occ wine 1-2 weekly   Drug use: No      Family History  Problem Relation Age of Onset   Diabetes Mother    Heart disease Mother    Hypertension Mother    Diabetes Son    Diabetes Sister    Inflammatory bowel disease Father        diverticulitis   Multiple myeloma Father    Hypertension Son    Diabetes  Maternal Grandmother    Colon cancer Neg Hx    Esophageal cancer Neg Hx    Rectal cancer Neg Hx    Stomach cancer Neg Hx    Breast cancer Neg Hx      Allergies  Allergen Reactions   Codeine     "felt drunk, crazy"   Hydrocodone Other (See Comments)   Hydrocodone-Acetaminophen Other (See Comments)    Confusion/delirium   Lidocaine     Heart racing, increased BP - from local at dentist (likely epi)   Nsaids Other (See Comments)    Avoids due to IBS symptoms (bleeding symptoms)   Oxycodone     Confusion/delirium     REVIEW OF SYSTEMS (Negative unless checked)   Constitutional: [] Weight loss  [] Fever  [] Chills Cardiac: [] Chest pain   [] Chest pressure   [] Palpitations   [] Shortness of breath when laying flat   [] Shortness of breath at rest   [] Shortness of breath with exertion. Vascular:  [x] Pain in legs with walking   [] Pain in legs at rest   [] Pain in legs when laying flat   [x] Claudication   [] Pain in feet when walking  Pain in feet at rest  [] Pain in feet when laying flat   [] History of DVT   [] Phlebitis   [] Swelling in legs   [] Varicose veins   [] Non-healing ulcers Pulmonary:   [] Uses home oxygen   [] Productive cough   [] Hemoptysis   [] Wheeze  [] COPD   [] Asthma Neurologic:  [] Dizziness  [] Blackouts   [] Seizures   [] History of stroke   [] History of TIA  [] Aphasia   [] Temporary blindness   [] Dysphagia   [] Weakness or numbness in arms   [] Weakness or numbness in legs Musculoskeletal:  [x] Arthritis   [] Joint swelling   [] Joint pain   [] Low back pain Hematologic:  [] Easy bruising  [] Easy bleeding   [] Hypercoagulable state    [] Anemic   Gastrointestinal:  [] Blood in stool   [] Vomiting blood  [x] Gastroesophageal reflux/heartburn   [] Abdominal pain Genitourinary:  [] Chronic kidney disease   [] Difficult urination  [] Frequent urination  [] Burning with urination   [x] Hematuria Skin:  [] Rashes   [] Ulcers   [] Wounds Psychological:  [] History of anxiety   [x]  History of major depression.  Physical Examination  BP 125/76 (BP Location: Left Arm)   Pulse (!) 59   Resp 15   Wt 104 lb (47.2 kg)   BMI 20.31 kg/m  Gen:  WD/WN, NAD. Appears younger than stated age. Head: [place]/AT, No temporalis wasting. Ear/Nose/Throat: Hearing grossly intact, nares w/o erythema or drainage Eyes: Conjunctiva clear. Sclera non-icteric Neck: Supple.  Trachea midline Pulmonary:  Good air movement, no use of accessory muscles.  Cardiac: RRR, no JVD Vascular:  Vessel Right Left  Radial Palpable Palpable                          PT 1+ Palpable Not Palpable  DP Not Palpable Not Palpable    Musculoskeletal: M/S 5/5 throughout.  No deformity or atrophy. No edema. Neurologic: Sensation grossly intact in extremities.  Symmetrical.  Speech is fluent.  Psychiatric: Judgment intact, Mood & affect appropriate for pt's clinical situation. Dermatologic: No rashes or ulcers noted.  No cellulitis or open wounds.      Labs Recent Results (from the past 2160 hour(s))  VAS [place] ABI WITH/WO TBI     Status: None   Collection Time: 03/23/23  8:24 AM  Result Value Ref Range   Right ABI 0.99  Left ABI 0.61   Ferritin     Status: None   Collection Time: 03/25/23  9:51 AM  Result Value Ref Range   Ferritin 18 11 - 307 ng/mL    Comment: Performed at [facility], [address] [place], [place] [address]  Iron and TIBC     Status: Abnormal   Collection Time: 03/25/23  9:51 AM  Result Value Ref Range   Iron 18 (L) 28 - 170 ug/dL   TIBC 696 295 - 284 ug/dL   Saturation Ratios 4 (L) 10.4 - 31.8 %   UIBC 394 ug/dL    Comment:  Performed at [facility], [address] [place], [place] [address]  CBC with Differential/Platelet     Status: Abnormal   Collection Time: 03/25/23  9:51 AM  Result Value Ref Range   WBC 7.7 4.0 - 10.5 K/uL   RBC 4.01 3.87 - 5.11 MIL/uL   Hemoglobin 10.7 (L) 12.0 - 15.0 g/dL   HCT 01.0 (L) 27.2 - 53.6 %   MCV 88.5 80.0 - 100.0 fL   MCH 26.7 26.0 - 34.0 pg   MCHC 30.1 30.0 - 36.0 g/dL   RDW 64.4 (H) 03.4 - 74.2 %   Platelets 435 (H) 150 - 400 K/uL   nRBC 0.0 0.0 - 0.2 %   Neutrophils Relative % 77 %   Neutro Abs 6.0 1.7 - 7.7 K/uL   Lymphocytes Relative 11 %   Lymphs Abs 0.8 0.7 - 4.0 K/uL   Monocytes Relative 9 %   Monocytes Absolute 0.7 0.1 - 1.0 K/uL   Eosinophils Relative 2 %   Eosinophils Absolute 0.1 0.0 - 0.5 K/uL   Basophils Relative 1 %   Basophils Absolute 0.1 0.0 - 0.1 K/uL   Immature Granulocytes 0 %   Abs Immature Granulocytes 0.02 0.00 - 0.07 K/uL    Comment: Performed at [facility], [address] [place], [place] [address]  Urinalysis, Complete     Status: Abnormal   Collection Time: 04/05/23 10:56 AM  Result Value Ref Range   Specific Gravity, UA 1.025 1.005 - 1.030   pH, UA 6.0 5.0 - 7.5   Color, UA Yellow Yellow   Appearance Ur Clear Clear   Leukocytes,UA 1+ (A) Negative   Protein,UA 2+ (A) Negative/Trace   Glucose, UA Negative Negative   Ketones, UA Negative Negative   RBC, UA 1+ (A) Negative   Bilirubin, UA Negative Negative   Urobilinogen, Ur 0.2 0.2 - 1.0 mg/dL   Nitrite, UA Negative Negative   Microscopic Examination See below:   Microscopic Examination     Status: Abnormal   Collection Time: 04/05/23 10:56 AM   Urine  Result Value Ref Range   WBC, UA 11-30 (A) 0 - 5 /hpf   RBC, Urine 11-30 (A) 0 - 2 /hpf   Epithelial Cells (non renal) 0-10 0 - 10 /hpf   Casts Present (A) None seen /lpf   Cast Type Granular casts (A) N/A    Comment: Hyaline casts   Mucus, UA Present (A) Not Estab.   Bacteria, UA Moderate (A) None  seen/Few  Bladder Scan (Post Void Residual) in office     Status: None   Collection Time: 04/05/23 11:11 AM  Result Value Ref Range   Scan Result 0   CULTURE, URINE COMPREHENSIVE     Status: None   Collection Time: 04/05/23 11:34 AM   Specimen: Urine   UR  Result Value Ref Range  Urine Culture, Comprehensive Final report    Organism ID, Bacteria Comment     Comment: Mixed urogenital flora 10,000-25,000 colony forming units per mL   BUN     Status: None   Collection Time: 04/14/23  7:13 AM  Result Value Ref Range   BUN 13 8 - 23 mg/dL    Comment: Performed at [facility], [address] [place], [place] [address]  Creatinine, serum     Status: None   Collection Time: 04/14/23  7:13 AM  Result Value Ref Range   Creatinine, Ser 0.71 0.44 - 1.00 mg/dL   GFR, Estimated >60 >45 mL/min    Comment: (NOTE) Calculated using the CKD-EPI Creatinine Equation (2021) Performed at [facility], [address] [place], [place] [address]   NM PET CT CARDIAC PERFUSION MULTI W/ABSOLUTE BLOODFLOW     Status: None   Collection Time: 05/19/23 10:57 AM  Result Value Ref Range   Rest Nuclear Isotope Dose 12.3 mCi   Stress Nuclear Isotope Dose 12.2 mCi   Rest HR 56.0 bpm   Rest BP 145/61 mmHg   Peak HR 82 bpm   Peak BP 118/46 mmHg   SSS 0.0    SRS 0.0    TID 0.91    Nuc Stress EF 77 %   Nuc Rest EF 71 %   ST Depression (mm) 0 mm   Rest MBF 0.99 ml/g/min   Stress MBF 1.87 ml/g/min   MBFR 1.89     Radiology NM PET CT CARDIAC PERFUSION MULTI W/ABSOLUTE BLOODFLOW  Result Date: 05/19/2023   The study demonstrates normal perfusion. The study is intermediate risk due to mildly reduced myocardial blood flow reserve, which may represent microvascular disease vs. multivessel CAD.   LV perfusion is normal. There is no evidence of ischemia. There is no evidence of infarction.   Rest left ventricular function is normal. Rest EF: 71%. Stress left ventricular function is normal.  Stress EF: 77%. End diastolic cavity size is normal. End systolic cavity size is normal. No evidence of transient ischemic dilation (TID) noted.   Myocardial blood flow was computed to be 0.22ml/g/min at rest and 1.66ml/g/min at stress. Global myocardial blood flow reserve was 1.89 and was mildly abnormal.   Coronary calcium was present on the attenuation correction CT images. Severe coronary calcifications were present. Coronary calcifications were present in the left anterior descending artery distribution(s). CLINICAL DATA:  This over-read does not include interpretation of cardiac or coronary anatomy or pathology. The Cardiac PET CT interpretation by the cardiologist is attached. COMPARISON:  None Available. FINDINGS: Vascular: Normal caliber thoracic aorta with severe calcified plaque. Severe coronary artery calcifications. No pericardial effusion. Mediastinum/Nodes: Esophagus is unremarkable. No enlarged lymph nodes seen in the chest. Lungs/Pleura: Central airways are patent. No consolidation, pleural effusion or pneumothorax. Mild atelectasis. Upper Abdomen: Low-attenuation nodule of the left adrenal gland measuring 10 mm on series 3, image 95, consistent with benign adenoma, no specific follow-up imaging is necessary. Cholecystectomy clips. Musculoskeletal: No aggressive appearing osseous lesions. IMPRESSION: 1. No acute extracardiac abnormality. 2. Aortic Atherosclerosis (ICD10-I70.0). Electronically Signed   By: Allegra Lai M.D.   On: 05/19/2023 11:28   Assessment/Plan  Atherosclerosis of native arteries of extremity with intermittent claudication (HCC) Her claudication symptoms have significantly improved over the past 2 months with increasing her walking and exercise.  She still is having significant symptoms but not nearly to the severity that she was 2 to 3 months ago.  She does not  have rest pain or ulceration.  She would like to hold off on surgery and at this time I find that to be  reasonable.  She does not have immediate limb threatening situation.  I have strongly recommended she quit smoking and that would give her the best chance of avoiding intervention.  She is going to try Chantix and we talked about smoking cessation for a long time today.  She will return in about 3 months with ABIs.  She will contact our office if she develops limb threatening symptoms of rest pain or ulceration.  Tobacco abuse We had a discussion for approximately 5 minutes regarding the absolute need for smoking cessation due to the deleterious nature of tobacco on the vascular system. We discussed the tobacco use would diminish patency of any intervention, and likely significantly worsen progressio of disease. We discussed multiple agents for quitting including replacement therapy or medications to reduce cravings such as Chantix. The patient voices their understanding of the importance of smoking cessation.  Diabetes mellitus, type 2 blood glucose control important in reducing the progression of atherosclerotic disease. Also, involved in wound healing. On appropriate medications.     Hyperlipidemia lipid control important in reducing the progression of atherosclerotic disease. Continue statin therapy   Carotid stenosis Being checked serially with duplex.  No new focal neurologic symptoms.   Festus Barren, MD  06/14/2023 10:55 AM    This note was created with Dragon medical transcription system.  Any errors from dictation are purely unintentional

## 2023-06-14 NOTE — Telephone Encounter (Signed)
Patient will be seen today and given information regarding her surgery with Dr. Wyn Quaker on 06/22/23. Pre-op is son 06/17/23 at 10:00 am at the [facility]. Pre-surgical instructions will be given to patient and sent to Mychart.

## 2023-06-16 ENCOUNTER — Telehealth (INDEPENDENT_AMBULATORY_CARE_PROVIDER_SITE_OTHER): Payer: Self-pay

## 2023-06-16 NOTE — Telephone Encounter (Signed)
Called and inform patient her Rx was sent in on 06/14/23. It should be available for pick up.

## 2023-06-16 NOTE — Telephone Encounter (Signed)
Patient called stating she saw you Tuesday and you mentioned you would send her in Rx to help her stop smoking. She was calling to see if you could send that in for her.    Please advise

## 2023-06-17 ENCOUNTER — Other Ambulatory Visit: Payer: PPO

## 2023-06-20 DIAGNOSIS — H353112 Nonexudative age-related macular degeneration, right eye, intermediate dry stage: Secondary | ICD-10-CM | POA: Diagnosis not present

## 2023-06-20 DIAGNOSIS — H353132 Nonexudative age-related macular degeneration, bilateral, intermediate dry stage: Secondary | ICD-10-CM | POA: Diagnosis not present

## 2023-06-22 ENCOUNTER — Inpatient Hospital Stay: Admit: 2023-06-22 | Payer: PPO | Admitting: Vascular Surgery

## 2023-06-22 SURGERY — ENDARTERECTOMY, FEMORAL
Anesthesia: General | Laterality: Bilateral

## 2023-07-25 ENCOUNTER — Inpatient Hospital Stay: Payer: PPO

## 2023-07-25 ENCOUNTER — Inpatient Hospital Stay (HOSPITAL_BASED_OUTPATIENT_CLINIC_OR_DEPARTMENT_OTHER): Payer: PPO | Admitting: Internal Medicine

## 2023-07-25 ENCOUNTER — Inpatient Hospital Stay: Payer: PPO | Attending: Internal Medicine

## 2023-07-25 VITALS — BP 127/73 | HR 64 | Temp 97.4°F | Resp 18

## 2023-07-25 VITALS — BP 131/89 | HR 99 | Temp 97.0°F | Resp 16 | Wt 100.6 lb

## 2023-07-25 DIAGNOSIS — D500 Iron deficiency anemia secondary to blood loss (chronic): Secondary | ICD-10-CM | POA: Diagnosis not present

## 2023-07-25 DIAGNOSIS — D509 Iron deficiency anemia, unspecified: Secondary | ICD-10-CM | POA: Insufficient documentation

## 2023-07-25 LAB — CBC WITH DIFFERENTIAL/PLATELET
Abs Immature Granulocytes: 0.06 10*3/uL (ref 0.00–0.07)
Basophils Absolute: 0.1 10*3/uL (ref 0.0–0.1)
Basophils Relative: 1 %
Eosinophils Absolute: 0 10*3/uL (ref 0.0–0.5)
Eosinophils Relative: 1 %
HCT: 30.3 % — ABNORMAL LOW (ref 36.0–46.0)
Hemoglobin: 9.2 g/dL — ABNORMAL LOW (ref 12.0–15.0)
Immature Granulocytes: 1 %
Lymphocytes Relative: 9 %
Lymphs Abs: 0.6 10*3/uL — ABNORMAL LOW (ref 0.7–4.0)
MCH: 28 pg (ref 26.0–34.0)
MCHC: 30.4 g/dL (ref 30.0–36.0)
MCV: 92.1 fL (ref 80.0–100.0)
Monocytes Absolute: 0.6 10*3/uL (ref 0.1–1.0)
Monocytes Relative: 8 %
Neutro Abs: 6 10*3/uL (ref 1.7–7.7)
Neutrophils Relative %: 80 %
Platelets: 461 10*3/uL — ABNORMAL HIGH (ref 150–400)
RBC: 3.29 MIL/uL — ABNORMAL LOW (ref 3.87–5.11)
RDW: 15.7 % — ABNORMAL HIGH (ref 11.5–15.5)
WBC: 7.5 10*3/uL (ref 4.0–10.5)
nRBC: 0 % (ref 0.0–0.2)

## 2023-07-25 LAB — IRON AND TIBC
Iron: 11 ug/dL — ABNORMAL LOW (ref 28–170)
Saturation Ratios: 3 % — ABNORMAL LOW (ref 10.4–31.8)
TIBC: 437 ug/dL (ref 250–450)
UIBC: 426 ug/dL

## 2023-07-25 LAB — FERRITIN: Ferritin: 12 ng/mL (ref 11–307)

## 2023-07-25 MED ORDER — SODIUM CHLORIDE 0.9% FLUSH
10.0000 mL | Freq: Once | INTRAVENOUS | Status: AC | PRN
  Administered 2023-07-25: 10 mL
  Filled 2023-07-25: qty 10

## 2023-07-25 MED ORDER — IRON SUCROSE 20 MG/ML IV SOLN
200.0000 mg | Freq: Once | INTRAVENOUS | Status: AC
  Administered 2023-07-25: 200 mg via INTRAVENOUS

## 2023-07-25 NOTE — Progress Notes (Signed)
[facility]  [phone] [phone]  ID: [name] OB: 1945/11/23  MR#: [id number]  XBM#:[id number]  Patient Care Team: Danella Penton, MD as PCP - General (Unknown Physician Specialty) Michaelyn Barter, MD as Consulting Physician (Oncology)   HPI: Yolanda Jimenez is a 77 y.o. female with past medical history of A-fib, arthritis, asthma, AVM, Barrett's esophagus, carotid stenosis, COPD, diabetes, GERD, IBS, PVD referred to hematology for management of iron deficiency anemia.  Patient reports longstanding history of iron deficiency anemia.  She has been on for count for many years.  Recently has been going through depression due to multiple difficult events happening in her life.  As a result she stopped her iron supplements.  She went back on it 3 to 4 days ago. Reports with her history of IBS sometimes she has voluminous and mushy stools.  After frequent wiping can notice some blood on the tissue paper at the end.   Colonoscopy from 06/27/2020 by Dr. Servando Snare showed multiple polyps and diverticulosis.  Pathology showed tubular adenoma x 5 and hyperplastic polyp x 1.  Follows with Dr. Apolinar Junes of urology for microscopic hematuria.  Had CT scans and urine cytology which was unremarkable.[name]  Has nocturia and was started on Mybetriq.    PVD -follows with Dr. Wyn Quaker.  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  Reports severe left leg pain.   Interval history Patient was seen today as follow-up for iron deficiency anemia and labs. She received IV Venofer x 5 infusions in September 2024.  Her hemoglobin today is still low at 9.2.  She continues to take oral iron.  Denies any bleeding in urine or stool.  REVIEW OF SYSTEMS:   ROS  As per HPI. Otherwise, a complete review of systems is negative.  PAST MEDICAL HISTORY: Past Medical History:  Diagnosis Date   A-fib (HCC)    Aortic atherosclerosis  (HCC)    Arthritis    back   Asthma    AVM (arteriovenous malformation)    Barrett esophagus    C. difficile diarrhea    Carotid stenosis    Complication of anesthesia    very hard to come out of anesthesia and pt states she gets very" loopy" after surgeries   COPD (chronic obstructive pulmonary disease) (HCC)    no inhalers   Depression    Diabetes mellitus without complication (HCC)    borderline   Diverticulosis    GERD (gastroesophageal reflux disease)    Heart murmur    Hx of adenomatous colonic polyps    Hyperlipidemia    IBS (irritable bowel syndrome)    Insomnia    Iron deficiency anemia    Kidney cysts    hx UTIs   Macular degeneration    left eye   Microscopic hematuria    Osteoporosis    mild   PVD (peripheral vascular disease) (HCC)    Wears dentures    partial lower    PAST SURGICAL HISTORY: Past Surgical History:  Procedure Laterality Date   BUNIONECTOMY     right foot   CATARACT EXTRACTION     both eyes   CERVICAL CONE BIOPSY     CHOLECYSTECTOMY     COLONOSCOPY     COLONOSCOPY WITH PROPOFOL N/A 01/22/2019   Procedure: COLONOSCOPY WITH BIOPSY;  Surgeon: Midge Minium, MD;  Location: [facility];  Service: Endoscopy;  Laterality: N/A;  diabetic - diet controlled  COLONOSCOPY WITH PROPOFOL N/A 06/27/2020   Procedure: COLONOSCOPY WITH BIOPSY;  Surgeon: Midge Minium, MD;  Location: [facility];  Service: Endoscopy;  Laterality: N/A;  priority 4   ESOPHAGOGASTRODUODENOSCOPY (EGD) WITH PROPOFOL N/A 08/29/2017   Procedure: ESOPHAGOGASTRODUODENOSCOPY (EGD) WITH PROPOFOL;  Surgeon: Midge Minium, MD;  Location: [facility];  Service: Endoscopy;  Laterality: N/A;   FOOT SURGERY     right 2nd toe   HIP ARTHROPLASTY Left 05/16/2018   Procedure: ARTHROPLASTY BIPOLAR HIP (HEMIARTHROPLASTY);  Surgeon: Christena Flake, MD;  Location: [facility] ORS;  Service: Orthopedics;  Laterality: Left;   LOWER EXTREMITY ANGIOGRAPHY Left 04/14/2023   Procedure:  Lower Extremity Angiography;  Surgeon: Annice Needy, MD;  Location: [facility] IN[facility] CV [facility];  Service: Cardiovascular;  Laterality: Left;   ORIF PERIPROSTHETIC FRACTURE Left 12/10/2021   Procedure: OPEN REDUCTION INTERNAL FIXATION OF A LEFT GREATER TROCHANTERIC FRACTURE;  Surgeon: Christena Flake, MD;  Location: [facility] OR[facility];  Service: Orthopedics;  Laterality: Left;   ORIF WRIST FRACTURE Right 10/07/2020   Procedure: OPEN REDUCTION INTERNAL FIXATION (ORIF) WRIST FRACTURE;  Surgeon: Kennedy Bucker, MD;  Location: [facility] OR[facility];  Service: Orthopedics;  Laterality: Right;   POLYPECTOMY N/A 01/22/2019   Procedure: POLYPECTOMY;  Surgeon: Midge Minium, MD;  Location: [facility];  Service: Endoscopy;  Laterality: N/A;  2 Clips placed at polyp removal site in ascending colon   POLYPECTOMY N/A 06/27/2020   Procedure: POLYPECTOMY;  Surgeon: Midge Minium, MD;  Location: [facility];  Service: Endoscopy;  Laterality: N/A;   TUBAL LIGATION     UPPER GASTROINTESTINAL ENDOSCOPY      FAMILY HISTORY: Family History  Problem Relation Age of Onset   Diabetes Mother    Heart disease Mother    Hypertension Mother    Diabetes Son    Diabetes Sister    Inflammatory bowel disease Father        diverticulitis   Multiple myeloma Father    Hypertension Son    Diabetes Maternal Grandmother    Colon cancer Neg Hx    Esophageal cancer Neg Hx    Rectal cancer Neg Hx    Stomach cancer Neg Hx    Breast cancer Neg Hx     HEALTH MAINTENANCE: Social History   Tobacco Use   Smoking status: Every Day    Current packs/day: 0.50    Average packs/day: 0.5 packs/day for 55.0 years (27.5 ttl pk-yrs)    Types: Cigarettes   Smokeless tobacco: Never   Tobacco comments:    she has patches to start just not started yet. smoked since teenager.  Vaping Use   Vaping status: Never Used  Substance Use Topics   Alcohol use: Yes    Alcohol/week: 2.0 standard drinks of alcohol    Types: 2 Glasses of wine per week     Comment: occ wine 1-2 weekly   Drug use: No     Allergies  Allergen Reactions   Codeine     "felt drunk, crazy"   Hydrocodone Other (See Comments)   Hydrocodone-Acetaminophen Other (See Comments)    Confusion/delirium   Lidocaine     Heart racing, increased BP - from local at dentist (likely epi)   Nsaids Other (See Comments)    Avoids due to IBS symptoms (bleeding symptoms)   Oxycodone     Confusion/delirium    Current Outpatient Medications  Medication Sig Dispense Refill   acetaminophen (TYLENOL) 500 MG tablet Take 1,000 mg by mouth every 6 (six) hours as  needed.     Ascorbic Acid (VITAMIN C) 1000 MG tablet Take 1,000 mg by mouth in the morning.     aspirin EC 81 MG tablet Take 1 tablet (81 mg total) by mouth daily. Swallow whole. 150 tablet 2   B Complex-C-Folic Acid (STRESS B COMPLEX PO) Take 1 capsule by mouth in the morning.     CHANTIX 0.5 MG tablet Take 0.5 mg daily for three days then 0.5 mg bid for four days  And then take 1 mg bid for 12 weeks. 90 tablet 3   denosumab (PROLIA) 60 MG/ML SOSY injection Inject 60 mg into the skin every 6 (six) months.     diazepam (VALIUM) 5 MG tablet Take 1 tablet (5 mg total) by mouth daily as needed for anxiety. (Patient taking differently: Take 5 mg by mouth 2 (two) times daily as needed for anxiety.) 10 tablet 0   ferrous gluconate (FERGON) 240 (27 FE) MG tablet Take 240 mg by mouth in the morning.     fluticasone (FLONASE) 50 MCG/ACT nasal spray Place 1 spray into both nostrils daily as needed for allergies or rhinitis.     loperamide (IMODIUM) 2 MG capsule Take 2 mg by mouth every morning.     metoprolol succinate (TOPROL-XL) 25 MG 24 hr tablet Take 1 tablet by mouth daily.     Multiple Vitamins-Minerals (PRESERVISION AREDS 2 PO) Take 2 tablets by mouth in the morning.     omeprazole (PRILOSEC) 40 MG capsule Take 40 mg by mouth daily before breakfast.     simvastatin (ZOCOR) 40 MG tablet Take 40 mg by mouth at bedtime. (Patient not  taking: Reported on 05/16/2023)     venlafaxine (EFFEXOR) 25 MG tablet Take 25 mg by mouth 2 (two) times daily.     No current facility-administered medications for this visit.    OBJECTIVE: Vitals:   07/25/23 1302  BP: 131/89  Pulse: 99  Resp: 16  Temp: (!) 97 F (36.1 C)  SpO2: 93%     Body mass index is 19.65 kg/m.      General: Well-developed, well-nourished, no acute distress. Eyes: Pink conjunctiva, anicteric sclera. HEENT: Normocephalic, moist mucous membranes, clear oropharnyx. Lungs: Clear to auscultation bilaterally. Heart: Regular rate and rhythm. No rubs, murmurs, or gallops. Abdomen: Soft, nontender, nondistended. No organomegaly noted, normoactive bowel sounds. Musculoskeletal: No edema, cyanosis, or clubbing. Neuro: Alert, answering all questions appropriately. Cranial nerves grossly intact. Skin: No rashes or petechiae noted. Psych: Normal affect. Lymphatics: No cervical, calvicular, axillary or inguinal LAD.   LAB RESULTS:  Lab Results  Component Value Date   NA 136 12/12/2021   K 3.7 12/12/2021   CL 104 12/12/2021   CO2 26 12/12/2021   GLUCOSE 123 (H) 12/12/2021   BUN 13 04/14/2023   CREATININE 0.71 04/14/2023   CALCIUM 8.3 (L) 12/12/2021   PROT 7.1 05/15/2018   ALBUMIN 4.2 05/15/2018   AST 25 05/15/2018   ALT 16 05/15/2018   ALKPHOS 62 05/15/2018   BILITOT 0.6 05/15/2018   GFRNONAA >60 04/14/2023   GFRAA >60 05/18/2018    Lab Results  Component Value Date   WBC 7.5 07/25/2023   NEUTROABS 6.0 07/25/2023   HGB 9.2 (L) 07/25/2023   HCT 30.3 (L) 07/25/2023   MCV 92.1 07/25/2023   PLT 461 (H) 07/25/2023    Lab Results  Component Value Date   TIBC 437 07/25/2023   TIBC 412 03/25/2023   FERRITIN 12 07/25/2023   FERRITIN 18 03/25/2023  IRONPCTSAT 3 (L) 07/25/2023   IRONPCTSAT 4 (L) 03/25/2023   IRONPCTSAT 26.7 05/18/2013     STUDIES: No results found.  ASSESSMENT AND PLAN:   TINA HERRINGSHAW is a 77 y.o. female with pmh of A-fib,  arthritis, asthma, AVM, Barrett's esophagus, carotid stenosis, COPD, diabetes, GERD, IBS, PVD referred to hematology for management of iron deficiency anemia.  # Iron deficiency anemia - Reports longstanding history of iron deficiency and is on Fergon 325 mg daily.  Not responding to oral supplements.  -On initial visit in May 2024, patient decided to opt for oral iron.  Repeat labs did not show much improvement.  She agreed for IV iron which was completed in September 2024 with 5 doses.  Her hemoglobin today has actually declined from 10.7-9.2.  Ferritin 12 and saturation 3%.  Will schedule her for IV Venofer weekly x 5 doses.  She follows with Dr. Servando Snare for IBS.  Her last colonoscopy was in November 2021 which showed multiple polyps with tubular adenoma.  In the previous visits, I discussed about GI workup which she had declined due to multiple stressors.  Today, with her iron not responding to infusions she is open to have repeat workup.  I reached out to Celso Amy, NP from [facility] office to assist with scheduling.  They will reach out to the patient.  I will follow-up with her in 3 months with repeat labs.  She has longstanding history of microscopic hematuria and follows with Dr. Apolinar Junes.  Had CT scans and urine cytology which was negative.  I still favor repeating GI workup to rule out any internal bleeding source.  # Nocturia -Follows with Dr. Apolinar Junes.  She was started on Mybertiq.    # Depression -Continue with Effexor   # PVD -Follows with Dr. Wyn Quaker.  Plan for the surgery soon for bilateral common femoral artery blockage.  # IBS-diarrhea -Follows with Dr. Servando Snare.  On Imodium.  Last colonoscopy November 2021 as above.  Orders Placed This Encounter  Procedures   CBC with Differential/Platelet   Ferritin   Iron and TIBC([facility])   RTC in 3 months for MD visit, labs, possible Venofer  Patient expressed understanding and was in agreement with this plan. She also understands  that She can call clinic at any time with any questions, concerns, or complaints.   I spent a total of 30 minutes reviewing chart data, face-to-face evaluation with the patient, counseling and coordination of care as detailed above.  Michaelyn Barter, MD   07/25/2023 3:08 PM

## 2023-07-25 NOTE — Progress Notes (Signed)
Patient has blood clots in her left leg and has to have surgery which she talks to surgeon on Friday.

## 2023-07-26 ENCOUNTER — Telehealth: Payer: Self-pay

## 2023-07-26 NOTE — Telephone Encounter (Signed)
Per Dr.Agrawal would like for patient to be seen due to IDA- I scheduled the patient 07/29/23 @ 9:45 am with Inetta Fermo- I have left several messages today on both home-cell VM.

## 2023-07-28 NOTE — Progress Notes (Deleted)
Celso Amy, PA-C [address]  [address]  [place], [place] [address]  Main: [phone]  Fax: [phone]   Primary Care Physician: Danella Penton, MD  Primary Gastroenterologist:  ***  CC: Iron deficiency anemia  HPI: Yolanda Jimenez is a 77 y.o. female is referred to evaluate iron deficiency anemia.  Labs 07/25/2023 showed hemoglobin 9.2, hematocrit 30, MCV 92, platelet 461, ferritin 12, low iron 11, iron saturation 3%.  Normal B12.  History of chronic iron deficiency anemia.  Recently saw hematology and received IV iron.  History of IBS with loose stools.  Takes Imodium as needed.  Is on Prilosec 40 mg daily for GERD.  06/2020 colonoscopy by Dr. Servando Snare: 5 small tubular adenoma polyps and 1 hyperplastic polyp removed.  01/2019 colonoscopy: Excellent prep, 15 mm tubular adenoma polyp removed from ascending colon, 3 mm polyp removed from ascending colon.  Diverticulosis and hemorrhoids.  Biopsies negative for microscopic colitis.  08/2017 EGD: Small hiatal hernia, LA grade A esophagitis, normal stomach and duodenum.  Biopsy showed Barrett's mucosa with no dysplasia.  She saw urology to evaluate hematuria.  Past medical history of A-fib, arthritis, asthma, AVM, Barrett's esophagus, carotid stenosis, COPD, diabetes, GERD, IBS, PVD.  Takes 81 mg aspirin daily.  No other blood thinners.    Current Outpatient Medications  Medication Sig Dispense Refill   acetaminophen (TYLENOL) 500 MG tablet Take 1,000 mg by mouth every 6 (six) hours as needed.     Ascorbic Acid (VITAMIN C) 1000 MG tablet Take 1,000 mg by mouth in the morning.     aspirin EC 81 MG tablet Take 1 tablet (81 mg total) by mouth daily. Swallow whole. 150 tablet 2   B Complex-C-Folic Acid (STRESS B COMPLEX PO) Take 1 capsule by mouth in the morning.     CHANTIX 0.5 MG tablet Take 0.5 mg daily for three days then 0.5 mg bid for four days  And then take 1 mg bid for 12 weeks. 90 tablet 3   denosumab (PROLIA) 60 MG/ML  SOSY injection Inject 60 mg into the skin every 6 (six) months.     diazepam (VALIUM) 5 MG tablet Take 1 tablet (5 mg total) by mouth daily as needed for anxiety. (Patient taking differently: Take 5 mg by mouth 2 (two) times daily as needed for anxiety.) 10 tablet 0   ferrous gluconate (FERGON) 240 (27 FE) MG tablet Take 240 mg by mouth in the morning.     fluticasone (FLONASE) 50 MCG/ACT nasal spray Place 1 spray into both nostrils daily as needed for allergies or rhinitis.     loperamide (IMODIUM) 2 MG capsule Take 2 mg by mouth every morning.     metoprolol succinate (TOPROL-XL) 25 MG 24 hr tablet Take 1 tablet by mouth daily.     Multiple Vitamins-Minerals (PRESERVISION AREDS 2 PO) Take 2 tablets by mouth in the morning.     omeprazole (PRILOSEC) 40 MG capsule Take 40 mg by mouth daily before breakfast.     simvastatin (ZOCOR) 40 MG tablet Take 40 mg by mouth at bedtime. (Patient not taking: Reported on 05/16/2023)     venlafaxine (EFFEXOR) 25 MG tablet Take 25 mg by mouth 2 (two) times daily.     No current facility-administered medications for this visit.    Allergies as of 07/29/2023 - Review Complete 06/14/2023  Allergen Reaction Noted   Codeine  08/23/2008   Hydrocodone Other (See Comments) 10/14/2022   Hydrocodone-acetaminophen Other (See Comments) 04/25/2015  Lidocaine  09/14/2018   Nsaids Other (See Comments) 05/15/2018   Oxycodone  10/21/2020    Past Medical History:  Diagnosis Date   A-fib [facility])    Aortic atherosclerosis (HCC)    Arthritis    back   Asthma    AVM (arteriovenous malformation)    Barrett esophagus    Benign hematuria 08/29/2014   W/u neg  Normal renal ultrasound 2/19  Cystoscopy normal 2019, [place]  CT -9/24     C. difficile diarrhea    Carotid stenosis    Complication of anesthesia    very hard to come out of anesthesia and pt states she gets very" loopy" after surgeries   COPD (chronic obstructive pulmonary disease) (HCC)    no inhalers    Depression    Diabetes mellitus without complication (HCC)    borderline   Diverticulosis    GERD (gastroesophageal reflux disease)    Heart murmur    Hx of adenomatous colonic polyps    Hyperlipidemia    IBS (irritable bowel syndrome)    Insomnia    Iron deficiency anemia    Kidney cysts    hx UTIs   Macular degeneration    left eye   Microscopic hematuria    Osteoporosis    mild   PVD (peripheral vascular disease) (HCC)    Wears dentures    partial lower    Past Surgical History:  Procedure Laterality Date   BUNIONECTOMY     right foot   CATARACT EXTRACTION     both eyes   CERVICAL CONE BIOPSY     CHOLECYSTECTOMY     COLONOSCOPY     COLONOSCOPY WITH PROPOFOL N/A 01/22/2019   Procedure: COLONOSCOPY WITH BIOPSY;  Surgeon: Midge Minium, MD;  Location: [facility];  Service: Endoscopy;  Laterality: N/A;  diabetic - diet controlled   COLONOSCOPY WITH PROPOFOL N/A 06/27/2020   Procedure: COLONOSCOPY WITH BIOPSY;  Surgeon: Midge Minium, MD;  Location: [facility];  Service: Endoscopy;  Laterality: N/A;  priority 4   ESOPHAGOGASTRODUODENOSCOPY (EGD) WITH PROPOFOL N/A 08/29/2017   Procedure: ESOPHAGOGASTRODUODENOSCOPY (EGD) WITH PROPOFOL;  Surgeon: Midge Minium, MD;  Location: [facility];  Service: Endoscopy;  Laterality: N/A;   FOOT SURGERY     right 2nd toe   HIP ARTHROPLASTY Left 05/16/2018   Procedure: ARTHROPLASTY BIPOLAR HIP (HEMIARTHROPLASTY);  Surgeon: Christena Flake, MD;  Location: [facility] OR[facility];  Service: Orthopedics;  Laterality: Left;   LOWER EXTREMITY ANGIOGRAPHY Left 04/14/2023   Procedure: Lower Extremity Angiography;  Surgeon: Annice Needy, MD;  Location: [facility] LAB;  Service: Cardiovascular;  Laterality: Left;   ORIF PERIPROSTHETIC FRACTURE Left 12/10/2021   Procedure: OPEN REDUCTION INTERNAL FIXATION OF A LEFT GREATER TROCHANTERIC FRACTURE;  Surgeon: Christena Flake, MD;  Location: [facility] ORS;  Service: Orthopedics;  Laterality: Left;   ORIF  WRIST FRACTURE Right 10/07/2020   Procedure: OPEN REDUCTION INTERNAL FIXATION (ORIF) WRIST FRACTURE;  Surgeon: Kennedy Bucker, MD;  Location: [facility];  Service: Orthopedics;  Laterality: Right;   POLYPECTOMY N/A 01/22/2019   Procedure: POLYPECTOMY;  Surgeon: Midge Minium, MD;  Location: [facility];  Service: Endoscopy;  Laterality: N/A;  2 Clips placed at polyp removal site in ascending colon   POLYPECTOMY N/A 06/27/2020   Procedure: POLYPECTOMY;  Surgeon: Midge Minium, MD;  Location: [facility];  Service: Endoscopy;  Laterality: N/A;   TUBAL LIGATION     UPPER GASTROINTESTINAL ENDOSCOPY      Review of Systems:  All systems reviewed and negative except where noted in HPI.   Physical Examination:   There were no vitals taken for this visit.  General: Well-nourished, well-developed in no acute distress.  Lungs: Clear to auscultation bilaterally. Non-labored. Heart: Regular rate and rhythm, no murmurs rubs or gallops.  Abdomen: Bowel sounds are normal; Abdomen is Soft; No hepatosplenomegaly, masses or hernias;  No Abdominal Tenderness; No guarding or rebound tenderness. Neuro: Alert and oriented x 3.  Grossly intact.  Psych: Alert and cooperative, normal mood and affect.   Imaging Studies: No results found.  Assessment and Plan:   Yolanda Jimenez is a 77 y.o. y/o female returns for evaluation of:  1.  Chronic recurrent iron deficiency anemia 2.  Barrett's esophagus 3.  History of adenomatous colon polyps 4.  Irritable bowel syndrome, diarrhea predominant  Plan: -Scheduling EGD & Colonoscopy I discussed risks of EGD and colonoscopy with patient to include risk of bleeding, perforation, and risk of sedation.  Patient expressed understanding and agrees to proceed with procedures.    -Continue to follow-up with hematologist for IV iron and oral iron as needed and to monitor labs.  -Continue pantoprazole 40 Mg daily for GERD -Continue Imodium as needed for  IBS-D -Avoid NSAIDs  Celso Amy, PA-C  Follow up ***  BP check ***

## 2023-07-29 ENCOUNTER — Other Ambulatory Visit (INDEPENDENT_AMBULATORY_CARE_PROVIDER_SITE_OTHER): Payer: Self-pay

## 2023-07-29 ENCOUNTER — Ambulatory Visit: Payer: PPO | Admitting: Physician Assistant

## 2023-07-29 ENCOUNTER — Telehealth (INDEPENDENT_AMBULATORY_CARE_PROVIDER_SITE_OTHER): Payer: Self-pay | Admitting: Vascular Surgery

## 2023-07-29 DIAGNOSIS — M47816 Spondylosis without myelopathy or radiculopathy, lumbar region: Secondary | ICD-10-CM | POA: Diagnosis not present

## 2023-07-29 DIAGNOSIS — Z96649 Presence of unspecified artificial hip joint: Secondary | ICD-10-CM | POA: Diagnosis not present

## 2023-07-29 DIAGNOSIS — M79605 Pain in left leg: Secondary | ICD-10-CM | POA: Diagnosis not present

## 2023-07-29 DIAGNOSIS — I739 Peripheral vascular disease, unspecified: Secondary | ICD-10-CM | POA: Diagnosis not present

## 2023-07-29 MED ORDER — CHANTIX 0.5 MG PO TABS: ORAL_TABLET | ORAL | 3 refills | Status: DC

## 2023-07-29 NOTE — Telephone Encounter (Signed)
Sent and patient was notified.

## 2023-07-29 NOTE — Telephone Encounter (Signed)
Patient called stating that her Pharmacy has no record of the Chantix Medicine that Dr. Wyn Quaker prescribed for her. Verified that we had correct pharmacy on file for patient. Patient is requesting that we send over Rx again.

## 2023-07-29 NOTE — Telephone Encounter (Signed)
Patient saw Dr. Joice Lofts today who referred patient to come back to [facility]. Patient states that after 50 steps (walking) she is now in severe pain. After 5 minutes of sitting her pain is gone. Patient would like to have surgery now as symptoms have gotten worse. Top of the leg to the top of the ankle she is in pain. Please advise

## 2023-07-29 NOTE — Telephone Encounter (Signed)
Left a message on the patient voicemail to return call to the office

## 2023-08-01 ENCOUNTER — Inpatient Hospital Stay: Payer: PPO

## 2023-08-01 VITALS — BP 113/65 | HR 70 | Temp 96.5°F | Resp 18

## 2023-08-01 DIAGNOSIS — D509 Iron deficiency anemia, unspecified: Secondary | ICD-10-CM | POA: Diagnosis not present

## 2023-08-01 DIAGNOSIS — D500 Iron deficiency anemia secondary to blood loss (chronic): Secondary | ICD-10-CM

## 2023-08-01 MED ORDER — SODIUM CHLORIDE 0.9% FLUSH
10.0000 mL | Freq: Once | INTRAVENOUS | Status: AC | PRN
  Administered 2023-08-01: 10 mL
  Filled 2023-08-01: qty 10

## 2023-08-01 MED ORDER — IRON SUCROSE 20 MG/ML IV SOLN
200.0000 mg | Freq: Once | INTRAVENOUS | Status: AC
Start: 2023-08-01 — End: 2023-08-01
  Administered 2023-08-01: 200 mg via INTRAVENOUS
  Filled 2023-08-01: qty 10

## 2023-08-05 ENCOUNTER — Ambulatory Visit (INDEPENDENT_AMBULATORY_CARE_PROVIDER_SITE_OTHER): Payer: PPO | Admitting: Vascular Surgery

## 2023-08-05 VITALS — BP 142/87 | HR 76 | Resp 15 | Wt 103.0 lb

## 2023-08-05 DIAGNOSIS — E785 Hyperlipidemia, unspecified: Secondary | ICD-10-CM | POA: Diagnosis not present

## 2023-08-05 DIAGNOSIS — I6523 Occlusion and stenosis of bilateral carotid arteries: Secondary | ICD-10-CM

## 2023-08-05 DIAGNOSIS — I70212 Atherosclerosis of native arteries of extremities with intermittent claudication, left leg: Secondary | ICD-10-CM

## 2023-08-05 DIAGNOSIS — E1151 Type 2 diabetes mellitus with diabetic peripheral angiopathy without gangrene: Secondary | ICD-10-CM

## 2023-08-05 NOTE — Assessment & Plan Note (Signed)
When seen about 7 weeks ago, the patient had tried to avoid surgery but now her symptoms are debilitating and unrelenting and she wants to proceed with surgery.  This would be bilateral femoral endarterectomies with planned Endologix aortoiliac stent graft to treat occlusive disease.  If we are unable to cross the chronic left common iliac artery occlusion, a femoral to femoral bypass may be required.  I have discussed these procedures in detail with the patient and her son today.  They desire to proceed.

## 2023-08-05 NOTE — Progress Notes (Signed)
MRN : [id number]  Yolanda Jimenez is a 77 y.o. ([birth year]) female who presents with chief complaint of  Chief Complaint  Patient presents with   Follow-up    Discuss surgery  .  History of Present Illness: Patient returns today in follow up of her peripheral arterial disease.  We saw her early last month and had planned a bilateral femoral endarterectomy with Endologix stent graft for her aortoiliac disease.  She decided not to have surgery as she said her symptoms were getting somewhat better and she did not not really want to have the surgery.  She has since seen her orthopedist and continues to have disabling claudication symptoms and only about 50 steps of walking.  Her left leg is more severely affected than the right which would correlate with the more severe vascular disease on the left leg by angiography.  She does not have open wounds.  She does not really have rest pain.  At this point, she is ready to proceed with surgery but she and her son presents today to discuss surgery further and have their questions answered.  Current Outpatient Medications  Medication Sig Dispense Refill   acetaminophen (TYLENOL) 500 MG tablet Take 1,000 mg by mouth every 6 (six) hours as needed.     Ascorbic Acid (VITAMIN C) 1000 MG tablet Take 1,000 mg by mouth in the morning.     aspirin EC 81 MG tablet Take 1 tablet (81 mg total) by mouth daily. Swallow whole. 150 tablet 2   B Complex-C-Folic Acid (STRESS B COMPLEX PO) Take 1 capsule by mouth in the morning.     CHANTIX 0.5 MG tablet Take 0.5 mg daily for three days then 0.5 mg bid for four days And then take 1 mg bid for 12 weeks. 90 tablet 3   denosumab (PROLIA) 60 MG/ML SOSY injection Inject 60 mg into the skin every 6 (six) months.     diazepam (VALIUM) 5 MG tablet Take 1 tablet (5 mg total) by mouth daily as needed for anxiety. (Patient taking differently: Take 5 mg by mouth 2 (two) times daily as needed for anxiety.) 10 tablet 0   ferrous  gluconate (FERGON) 240 (27 FE) MG tablet Take 240 mg by mouth in the morning.     fluticasone (FLONASE) 50 MCG/ACT nasal spray Place 1 spray into both nostrils daily as needed for allergies or rhinitis.     loperamide (IMODIUM) 2 MG capsule Take 2 mg by mouth every morning.     metoprolol succinate (TOPROL-XL) 25 MG 24 hr tablet Take 1 tablet by mouth daily.     Multiple Vitamins-Minerals (PRESERVISION AREDS 2 PO) Take 2 tablets by mouth in the morning.     omeprazole (PRILOSEC) 40 MG capsule Take 40 mg by mouth daily before breakfast.     venlafaxine (EFFEXOR) 25 MG tablet Take 25 mg by mouth 2 (two) times daily.     simvastatin (ZOCOR) 40 MG tablet Take 40 mg by mouth at bedtime. (Patient not taking: Reported on 05/16/2023)     No current facility-administered medications for this visit.    Past Medical History:  Diagnosis Date   A-fib [facility])    Aortic atherosclerosis (HCC)    Arthritis    back   Asthma    AVM (arteriovenous malformation)    Barrett esophagus    Benign hematuria 08/29/2014   W/u neg  Normal renal ultrasound 2/19  Cystoscopy normal 2019, [place]  CT -9/24  C. difficile diarrhea    Carotid stenosis    Complication of anesthesia    very hard to come out of anesthesia and pt states she gets very" loopy" after surgeries   COPD (chronic obstructive pulmonary disease) (HCC)    no inhalers   Depression    Diabetes mellitus without complication (HCC)    borderline   Diverticulosis    GERD (gastroesophageal reflux disease)    Heart murmur    Hx of adenomatous colonic polyps    Hyperlipidemia    IBS (irritable bowel syndrome)    Insomnia    Iron deficiency anemia    Kidney cysts    hx UTIs   Macular degeneration    left eye   Microscopic hematuria    Osteoporosis    mild   PVD (peripheral vascular disease) (HCC)    Wears dentures    partial lower    Past Surgical History:  Procedure Laterality Date   BUNIONECTOMY     right foot   CATARACT EXTRACTION      both eyes   CERVICAL CONE BIOPSY     CHOLECYSTECTOMY     COLONOSCOPY     COLONOSCOPY WITH PROPOFOL N/A 01/22/2019   Procedure: COLONOSCOPY WITH BIOPSY;  Surgeon: Midge Minium, MD;  Location: [facility];  Service: Endoscopy;  Laterality: N/A;  diabetic - diet controlled   COLONOSCOPY WITH PROPOFOL N/A 06/27/2020   Procedure: COLONOSCOPY WITH BIOPSY;  Surgeon: Midge Minium, MD;  Location: [facility];  Service: Endoscopy;  Laterality: N/A;  priority 4   ESOPHAGOGASTRODUODENOSCOPY (EGD) WITH PROPOFOL N/A 08/29/2017   Procedure: ESOPHAGOGASTRODUODENOSCOPY (EGD) WITH PROPOFOL;  Surgeon: Midge Minium, MD;  Location: [facility];  Service: Endoscopy;  Laterality: N/A;   FOOT SURGERY     right 2nd toe   HIP ARTHROPLASTY Left 05/16/2018   Procedure: ARTHROPLASTY BIPOLAR HIP (HEMIARTHROPLASTY);  Surgeon: Christena Flake, MD;  Location: [facility] ORS;  Service: Orthopedics;  Laterality: Left;   LOWER EXTREMITY ANGIOGRAPHY Left 04/14/2023   Procedure: Lower Extremity Angiography;  Surgeon: Annice Needy, MD;  Location: [facility] INVASIVE CV LAB;  Service: Cardiovascular;  Laterality: Left;   ORIF PERIPROSTHETIC FRACTURE Left 12/10/2021   Procedure: OPEN REDUCTION INTERNAL FIXATION OF A LEFT GREATER TROCHANTERIC FRACTURE;  Surgeon: Christena Flake, MD;  Location: [facility] ORS;  Service: Orthopedics;  Laterality: Left;   ORIF WRIST FRACTURE Right 10/07/2020   Procedure: OPEN REDUCTION INTERNAL FIXATION (ORIF) WRIST FRACTURE;  Surgeon: Kennedy Bucker, MD;  Location: [facility] ORS;  Service: Orthopedics;  Laterality: Right;   POLYPECTOMY N/A 01/22/2019   Procedure: POLYPECTOMY;  Surgeon: Midge Minium, MD;  Location: [facility];  Service: Endoscopy;  Laterality: N/A;  2 Clips placed at polyp removal site in ascending colon   POLYPECTOMY N/A 06/27/2020   Procedure: POLYPECTOMY;  Surgeon: Midge Minium, MD;  Location: [facility];  Service: Endoscopy;  Laterality: N/A;   TUBAL LIGATION     UPPER  GASTROINTESTINAL ENDOSCOPY       Social History   Tobacco Use   Smoking status: Every Day    Current packs/day: 0.50    Average packs/day: 0.5 packs/day for 55.0 years (27.5 ttl pk-yrs)    Types: Cigarettes   Smokeless tobacco: Never   Tobacco comments:    she has patches to start just not started yet. smoked since teenager.  Vaping Use   Vaping status: Never Used  Substance Use Topics   Alcohol use: Yes    Alcohol/week: 2.0 standard drinks  of alcohol    Types: 2 Glasses of wine per week    Comment: occ wine 1-2 weekly   Drug use: No      Family History  Problem Relation Age of Onset   Diabetes Mother    Heart disease Mother    Hypertension Mother    Diabetes Son    Diabetes Sister    Inflammatory bowel disease Father        diverticulitis   Multiple myeloma Father    Hypertension Son    Diabetes Maternal Grandmother    Colon cancer Neg Hx    Esophageal cancer Neg Hx    Rectal cancer Neg Hx    Stomach cancer Neg Hx    Breast cancer Neg Hx      Allergies  Allergen Reactions   Codeine     "felt drunk, crazy"   Hydrocodone Other (See Comments)   Hydrocodone-Acetaminophen Other (See Comments)    Confusion/delirium   Lidocaine     Heart racing, increased BP - from local at dentist (likely epi)   Nsaids Other (See Comments)    Avoids due to IBS symptoms (bleeding symptoms)   Oxycodone     Confusion/delirium     REVIEW OF SYSTEMS (Negative unless checked)   Constitutional: [] Weight loss  [] Fever  [] Chills Cardiac: [] Chest pain   [] Chest pressure   [] Palpitations   [] Shortness of breath when laying flat   [] Shortness of breath at rest   [] Shortness of breath with exertion. Vascular:  [x] Pain in legs with walking   [] Pain in legs at rest   [] Pain in legs when laying flat   [x] Claudication   [] Pain in feet when walking  Pain in feet at rest  [] Pain in feet when laying flat   [] History of DVT   [] Phlebitis   [] Swelling in legs   [] Varicose veins   [] Non-healing  ulcers Pulmonary:   [] Uses home oxygen   [] Productive cough   [] Hemoptysis   [] Wheeze  [] COPD   [] Asthma Neurologic:  [] Dizziness  [] Blackouts   [] Seizures   [] History of stroke   [] History of TIA  [] Aphasia   [] Temporary blindness   [] Dysphagia   [] Weakness or numbness in arms   [] Weakness or numbness in legs Musculoskeletal:  [x] Arthritis   [] Joint swelling   [] Joint pain   [] Low back pain Hematologic:  [] Easy bruising  [] Easy bleeding   [] Hypercoagulable state   [] Anemic   Gastrointestinal:  [] Blood in stool   [] Vomiting blood  [x] Gastroesophageal reflux/heartburn   [] Abdominal pain Genitourinary:  [] Chronic kidney disease   [] Difficult urination  [] Frequent urination  [] Burning with urination   [x] Hematuria Skin:  [] Rashes   [] Ulcers   [] Wounds Psychological:  [] History of anxiety   [x]  History of major depression.  Physical Examination  BP (!) 142/87   Pulse 76   Resp 15   Wt 103 lb (46.7 kg)   BMI 20.12 kg/m  Gen:  WD/WN, NAD. Appears younger than stated age. Head: Start/AT, No temporalis wasting. Ear/Nose/Throat: Hearing grossly intact, nares w/o erythema or drainage Eyes: Conjunctiva clear. Sclera non-icteric Neck: Supple.  Trachea midline Pulmonary:  Good air movement, no use of accessory muscles.  Cardiac: RRR, no JVD Vascular:  Vessel Right Left  Radial Palpable Palpable                          PT 1+ Palpable Not Palpable  DP Not Palpable Not Palpable   Gastrointestinal: soft, non-tender/non-distended.  No guarding/reflex.  Musculoskeletal: M/S 5/5 throughout.  No deformity or atrophy. No edema. Neurologic: Sensation grossly intact in extremities.  Symmetrical.  Speech is fluent.  Psychiatric: Judgment intact, Mood & affect appropriate for pt's clinical situation. Dermatologic: No rashes or ulcers noted.  No cellulitis or open wounds.      Labs Recent Results (from the past 2160 hours)  NM PET CT CARDIAC PERFUSION MULTI W/ABSOLUTE BLOODFLOW     Status: None    Collection Time: 05/19/23 10:57 AM  Result Value Ref Range   Rest Nuclear Isotope Dose 12.3 mCi   Stress Nuclear Isotope Dose 12.2 mCi   Rest HR 56.0 bpm   Rest BP 145/61 mmHg   Peak HR 82 bpm   Peak BP 118/46 mmHg   SSS 0.0    SRS 0.0    TID 0.91    Nuc Stress EF 77 %   Nuc Rest EF 71 %   ST Depression (mm) 0 mm   Rest MBF 0.99 ml/g/min   Stress MBF 1.87 ml/g/min   MBFR 1.89   Ferritin     Status: None   Collection Time: 07/25/23 12:34 PM  Result Value Ref Range   Ferritin 12 11 - 307 ng/mL    Comment: Performed at [facility], [address] [place], [place] [address]  Iron and TIBC     Status: Abnormal   Collection Time: 07/25/23 12:34 PM  Result Value Ref Range   Iron 11 (L) 28 - 170 ug/dL   TIBC 440 102 - 725 ug/dL   Saturation Ratios 3 (L) 10.4 - 31.8 %   UIBC 426 ug/dL    Comment: Performed at [facility], [address] [place], [place] [address]  CBC with Differential/Platelet     Status: Abnormal   Collection Time: 07/25/23 12:34 PM  Result Value Ref Range   WBC 7.5 4.0 - 10.5 K/uL   RBC 3.29 (L) 3.87 - 5.11 MIL/uL   Hemoglobin 9.2 (L) 12.0 - 15.0 g/dL   HCT 03.4 (L) 74.2 - 59.5 %   MCV 92.1 80.0 - 100.0 fL   MCH 28.0 26.0 - 34.0 pg   MCHC 30.4 30.0 - 36.0 g/dL   RDW 63.8 (H) 75.6 - 43.3 %   Platelets 461 (H) 150 - 400 K/uL   nRBC 0.0 0.0 - 0.2 %   Neutrophils Relative % 80 %   Neutro Abs 6.0 1.7 - 7.7 K/uL   Lymphocytes Relative 9 %   Lymphs Abs 0.6 (L) 0.7 - 4.0 K/uL   Monocytes Relative 8 %   Monocytes Absolute 0.6 0.1 - 1.0 K/uL   Eosinophils Relative 1 %   Eosinophils Absolute 0.0 0.0 - 0.5 K/uL   Basophils Relative 1 %   Basophils Absolute 0.1 0.0 - 0.1 K/uL   Immature Granulocytes 1 %   Abs Immature Granulocytes 0.06 0.00 - 0.07 K/uL    Comment: Performed at [facility], [address], [place], [place] [address]    Radiology No results found.  Assessment/Plan  Atherosclerosis of native arteries of  extremity with intermittent claudication (HCC) When seen about 7 weeks ago, the patient had tried to avoid surgery but now her symptoms are debilitating and unrelenting and she wants to proceed with surgery.  This would be bilateral femoral endarterectomies with planned Endologix aortoiliac stent graft to treat occlusive disease.  If we are unable to cross the chronic left common iliac artery occlusion, a femoral to femoral bypass may  be required.  I have discussed these procedures in detail with the patient and her son today.  They desire to proceed.  Diabetes mellitus, type 2 blood glucose control important in reducing the progression of atherosclerotic disease. Also, involved in wound healing. On appropriate medications.     Hyperlipidemia lipid control important in reducing the progression of atherosclerotic disease. Continue statin therapy   Carotid stenosis Being checked serially with duplex.  No new focal neurologic symptoms.  Festus Barren, MD  08/05/2023 12:16 PM    This note was created with Dragon medical transcription system.  Any errors from dictation are purely unintentional

## 2023-08-08 ENCOUNTER — Inpatient Hospital Stay: Payer: PPO

## 2023-08-08 VITALS — BP 120/69 | HR 55 | Temp 96.2°F | Resp 17

## 2023-08-08 DIAGNOSIS — D509 Iron deficiency anemia, unspecified: Secondary | ICD-10-CM | POA: Diagnosis not present

## 2023-08-08 DIAGNOSIS — D500 Iron deficiency anemia secondary to blood loss (chronic): Secondary | ICD-10-CM

## 2023-08-08 MED ORDER — IRON SUCROSE 20 MG/ML IV SOLN
200.0000 mg | Freq: Once | INTRAVENOUS | Status: AC
  Administered 2023-08-08: 200 mg via INTRAVENOUS
  Filled 2023-08-08: qty 10

## 2023-08-08 MED ORDER — SODIUM CHLORIDE 0.9% FLUSH
10.0000 mL | Freq: Once | INTRAVENOUS | Status: AC | PRN
  Administered 2023-08-08: 10 mL
  Filled 2023-08-08: qty 10

## 2023-08-08 NOTE — Telephone Encounter (Signed)
Patient came to front desk asking if surgery is going to be scheduled. Patient can be reached at either phone number

## 2023-08-08 NOTE — Telephone Encounter (Signed)
I have scheduled the patient for 08/24/23, I am now waiting on [facility] to send back a cardiac clearance before completing this surgery.

## 2023-08-11 NOTE — Telephone Encounter (Signed)
 F/u    The patient called with questions about the surgery. Will discuss this when the surgical scheduler calls back

## 2023-08-12 ENCOUNTER — Telehealth (INDEPENDENT_AMBULATORY_CARE_PROVIDER_SITE_OTHER): Payer: Self-pay

## 2023-08-12 NOTE — Telephone Encounter (Signed)
 Spoke with the patient and she is scheduled with Dr. Marea on 08/24/23 for a bilateral femoral endarterectomy with Endologix stent graft [facility]. Pre-op is on 08/17/23 at 10:00 am. Pre-surgical instructions were discussed and will be sent to Mychart and mailed.

## 2023-08-14 NOTE — Progress Notes (Signed)
 Ellouise Console, PA-C [address]  [address]  [place], [place] [address]  Main: [phone]  Fax: [phone]   Primary Care Physician: Cleotilde Oneil FALCON, MD  Primary Gastroenterologist:  Ellouise Console, PA-C / Dr. Rogelia Copping    CC: Follow-up irritable bowel syndrome with diarrhea and anemia  HPI: Yolanda Jimenez is a 78 y.o. female returns for follow-up of diarrhea, iron  deficiency anemia, and weight loss.  Patient saw Dr. Copping 05/11/2023 for irritable bowel syndrome, diarrhea predominant.  Diarrhea has been treated with 1 Imodium  daily.  Patient has a long history of IBS, diarrhea predominant.  She is having multiple loose stools daily with fecal incontinence.  Diarrhea is not controlled.  She denies any antibiotic use.    She has recently seen hematologist Dr. Gwynda for worsening iron  deficiency anemia.  She has had multiple IV iron  infusions in the past few months.  Patient rarely sees bright red blood on the tissue after bowel movement.  No other episodes of rectal bleeding or black stools.  She denies abdominal pain.  She has had 20 pound unintentional weight loss in the past year.  She eats meat.  She does not donate blood.  07/25/2023: Labs: Hemoglobin 9.2, hematocrit 30, MCV 92, platelet 461, low iron  11, iron  saturation 3%, ferritin 12.  She has had chronic iron  deficiency anemia for many years.  She received IV iron  infusion 08/01/2023.  Is followed by hematologist Dr. Clista.  01/2019 colonoscopy: 15 mm tubular adenoma polyp removed from ascending colon.  3 mm polyp removed from ascending colon.  Excellent prep.  Diverticulosis and internal hemorrhoids.  Biopsies were negative for microscopic colitis and IBD.  06/2020 colonoscopy: 6 small (2 mm to 4 mm) tubular adenoma and hyperplastic polyps removed from throughout the colon.  Diverticulosis.  Excellent prep.  No further repeat colonoscopies were recommended due to advanced age.  08/2017 EGD: LA grade A esophagitis, small  hiatal hernia, normal stomach and duodenum.  Biopsy showed Barrett's mucosa with no dysplasia.  GERD is controlled on Prilosec  40 Mg once daily.  2014: Celiac Panal Labs Negative.  Remote History of C. Difficile in 2012.  Past medical history of A-fib, arthritis, asthma, AVM, GERD, Barrett's esophagus, carotid stenosis, COPD, diabetes, GERD, IBS, PVD.  Takes 81 mg aspirin  daily.  No other blood thinners.  Patient is scheduled for bilateral lower vascular surgery next week due to blood clots and severe peripheral vascular disease.  Current Outpatient Medications  Medication Sig Dispense Refill   acetaminophen  (TYLENOL ) 500 MG tablet Take 1,000 mg by mouth every 6 (six) hours as needed.     Ascorbic Acid  (VITAMIN C ) 1000 MG tablet Take 1,000 mg by mouth in the morning.     aspirin  EC 81 MG tablet Take 1 tablet (81 mg total) by mouth daily. Swallow whole. 150 tablet 2   B Complex-C-Folic Acid  (STRESS B COMPLEX PO) Take 1 capsule by mouth in the morning.     CHANTIX  0.5 MG tablet Take 0.5 mg daily for three days then 0.5 mg bid for four days And then take 1 mg bid for 12 weeks. 90 tablet 3   denosumab  (PROLIA ) 60 MG/ML SOSY injection Inject 60 mg into the skin every 6 (six) months.     diazepam  (VALIUM ) 5 MG tablet Take 1 tablet (5 mg total) by mouth daily as needed for anxiety. (Patient taking differently: Take 5 mg by mouth 2 (two) times daily as needed for anxiety.) 10 tablet 0  ferrous gluconate  (FERGON) 240 (27 FE) MG tablet Take 240 mg by mouth in the morning.     fluticasone  (FLONASE ) 50 MCG/ACT nasal spray Place 1 spray into both nostrils daily as needed for allergies or rhinitis.     loperamide  (IMODIUM ) 2 MG capsule Take 2 mg by mouth every morning.     metoprolol  succinate (TOPROL -XL) 25 MG 24 hr tablet Take 1 tablet by mouth daily.     Multiple Vitamins-Minerals (PRESERVISION AREDS 2 PO) Take 2 tablets by mouth in the morning.     omeprazole  (PRILOSEC ) 40 MG capsule Take 40 mg by mouth  daily before breakfast.     simvastatin  (ZOCOR ) 40 MG tablet Take 40 mg by mouth at bedtime.     venlafaxine  (EFFEXOR ) 25 MG tablet Take 25 mg by mouth 2 (two) times daily.     No current facility-administered medications for this visit.    Allergies as of 08/15/2023 - Review Complete 08/15/2023  Allergen Reaction Noted   Codeine  08/23/2008   Hydrocodone Other (See Comments) 10/14/2022   Hydrocodone-acetaminophen  Other (See Comments) 04/25/2015   Lidocaine   09/14/2018   Nsaids Other (See Comments) 05/15/2018   Oxycodone   10/21/2020    Past Medical History:  Diagnosis Date   A-fib [facility])    Aortic atherosclerosis (HCC)    Arthritis    back   Asthma    AVM (arteriovenous malformation)    Barrett esophagus    Benign hematuria 08/29/2014   W/u neg  Normal renal ultrasound 2/19  Cystoscopy normal 2019, [place]  CT -9/24     C. difficile diarrhea    Carotid stenosis    Complication of anesthesia    very hard to come out of anesthesia and pt states she gets very loopy after surgeries   COPD (chronic obstructive pulmonary disease) (HCC)    no inhalers   Depression    Diabetes mellitus without complication (HCC)    borderline   Diverticulosis    GERD (gastroesophageal reflux disease)    Heart murmur    Hx of adenomatous colonic polyps    Hyperlipidemia    IBS (irritable bowel syndrome)    Insomnia    Iron  deficiency anemia    Kidney cysts    hx UTIs   Macular degeneration    left eye   Microscopic hematuria    Osteoporosis    mild   PVD (peripheral vascular disease) (HCC)    Wears dentures    partial lower    Past Surgical History:  Procedure Laterality Date   BUNIONECTOMY     right foot   CATARACT EXTRACTION     both eyes   CERVICAL CONE BIOPSY     CHOLECYSTECTOMY     COLONOSCOPY     COLONOSCOPY WITH PROPOFOL  N/A 01/22/2019   Procedure: COLONOSCOPY WITH BIOPSY;  Surgeon: Jinny Carmine, MD;  Location: [facility];  Service: Endoscopy;  Laterality:  N/A;  diabetic - diet controlled   COLONOSCOPY WITH PROPOFOL  N/A 06/27/2020   Procedure: COLONOSCOPY WITH BIOPSY;  Surgeon: Jinny Carmine, MD;  Location: [facility]ERY [facility];  Service: Endoscopy;  Laterality: N/A;  priority 4   ESOPHAGOGASTRODUODENOSCOPY (EGD) WITH PROPOFOL  N/A 08/29/2017   Procedure: ESOPHAGOGASTRODUODENOSCOPY (EGD) WITH PROPOFOL ;  Surgeon: Jinny Carmine, MD;  Location: [facility];  Service: Endoscopy;  Laterality: N/A;   FOOT SURGERY     right 2nd toe   HIP ARTHROPLASTY Left 05/16/2018   Procedure: ARTHROPLASTY BIPOLAR HIP (HEMIARTHROPLASTY);  Surgeon: Edie Norleen PARAS, MD;  Location: [facility] ORS;  Service: Orthopedics;  Laterality: Left;   LOWER EXTREMITY ANGIOGRAPHY Left 04/14/2023   Procedure: Lower Extremity Angiography;  Surgeon: Marea Selinda RAMAN, MD;  Location: [facility] INVASIVE CV LAB;  Service: Cardiovascular;  Laterality: Left;   ORIF PERIPROSTHETIC FRACTURE Left 12/10/2021   Procedure: OPEN REDUCTION INTERNAL FIXATION OF A LEFT GREATER TROCHANTERIC FRACTURE;  Surgeon: Edie Norleen PARAS, MD;  Location: [facility] ORS;  Service: Orthopedics;  Laterality: Left;   ORIF WRIST FRACTURE Right 10/07/2020   Procedure: OPEN REDUCTION INTERNAL FIXATION (ORIF) WRIST FRACTURE;  Surgeon: Kathlynn Sharper, MD;  Location: [facility] ORS;  Service: Orthopedics;  Laterality: Right;   POLYPECTOMY N/A 01/22/2019   Procedure: POLYPECTOMY;  Surgeon: Jinny Carmine, MD;  Location: [facility];  Service: Endoscopy;  Laterality: N/A;  2 Clips placed at polyp removal site in ascending colon   POLYPECTOMY N/A 06/27/2020   Procedure: POLYPECTOMY;  Surgeon: Jinny Carmine, MD;  Location: [facility]ERY [facility];  Service: Endoscopy;  Laterality: N/A;   TUBAL LIGATION     UPPER GASTROINTESTINAL ENDOSCOPY      Review of Systems:    All systems reviewed and negative except where noted in HPI.   Physical Examination:   BP 128/74   Pulse 86   Temp 97.6 F (36.4 C)   Ht 5' (1.524 m)   Wt 103 lb 3.2 oz (46.8 kg)   BMI 20.15  kg/m   General: Well-nourished, Thin, well-developed in no acute distress.  Lungs: Clear to auscultation bilaterally. Non-labored. Heart: Regular rate and rhythm, no murmurs rubs or gallops.  Abdomen: Bowel sounds are normal; Abdomen is Soft; No hepatosplenomegaly, masses or hernias;  No Abdominal Tenderness; No guarding or rebound tenderness. Neuro: Alert and oriented x 3.  Grossly intact.  She walks with no assistive devices.  She is able to get on and off exam table with mild help. Psych: Alert and cooperative, normal mood and affect.  Imaging Studies: No results found.  Assessment and Plan:   Yolanda Jimenez is a 78 y.o. y/o female returns for follow-up of:  1.  Chronic iron  deficiency anemia -treated with IV iron   Labs: CBC, iron  panel, ferritin, and celiac lab.   May need repeat EGD / Colonoscopy plus Capsule Endoscopy.  She is scheduled for vascular surgery next week and she needs to recover from surgery before scheduling any GI procedures.    2.  Diarrhea with Fecal Incontinence  Stool Studies: GI Pathogen Panal, C. Difficile Toxin PCR, Fecal Calprotectin   Lab: Celiac Panal  If stool studies are Negative for Infection then start FiberCon Tablets 2 tablets once or twice daily with 6-8 ounces of water .  Also OK to take Imodium  prn.  3.  Chronic GERD with esophagitis Continue Omeprazole  40mg  daily.   4.  Barrett's esophagus  Discuss surveillance EGD at next OV.  Continue Omeprazole  40mg  daily.  5.  History of adenomatous colon polyps  No further colonoscopies were recommended due to advanced age.  6.  Severe Peripheral Vascular Disease with current DVTs.  She is scheduled for Vascular surgery next week of both lower extremities by Dr. Marea.   Ellouise Console, PA-C  Follow up 5 weeks with TG.

## 2023-08-15 ENCOUNTER — Inpatient Hospital Stay: Payer: PPO | Attending: Internal Medicine

## 2023-08-15 ENCOUNTER — Ambulatory Visit: Payer: PPO | Admitting: Physician Assistant

## 2023-08-15 ENCOUNTER — Encounter: Payer: Self-pay | Admitting: Physician Assistant

## 2023-08-15 VITALS — BP 143/77 | HR 86 | Temp 97.2°F | Resp 18

## 2023-08-15 VITALS — BP 128/74 | HR 86 | Temp 97.6°F | Ht 60.0 in | Wt 103.2 lb

## 2023-08-15 DIAGNOSIS — Z860101 Personal history of adenomatous and serrated colon polyps: Secondary | ICD-10-CM | POA: Diagnosis not present

## 2023-08-15 DIAGNOSIS — K2270 Barrett's esophagus without dysplasia: Secondary | ICD-10-CM | POA: Diagnosis not present

## 2023-08-15 DIAGNOSIS — R159 Full incontinence of feces: Secondary | ICD-10-CM | POA: Diagnosis not present

## 2023-08-15 DIAGNOSIS — K2100 Gastro-esophageal reflux disease with esophagitis, without bleeding: Secondary | ICD-10-CM

## 2023-08-15 DIAGNOSIS — D509 Iron deficiency anemia, unspecified: Secondary | ICD-10-CM

## 2023-08-15 DIAGNOSIS — D500 Iron deficiency anemia secondary to blood loss (chronic): Secondary | ICD-10-CM

## 2023-08-15 DIAGNOSIS — R197 Diarrhea, unspecified: Secondary | ICD-10-CM

## 2023-08-15 DIAGNOSIS — K580 Irritable bowel syndrome with diarrhea: Secondary | ICD-10-CM

## 2023-08-15 MED ORDER — IRON SUCROSE 20 MG/ML IV SOLN
200.0000 mg | Freq: Once | INTRAVENOUS | Status: AC
  Administered 2023-08-15: 200 mg via INTRAVENOUS

## 2023-08-16 LAB — CBC WITH DIFFERENTIAL/PLATELET
Basophils Absolute: 0.1 10*3/uL (ref 0.0–0.2)
Basos: 1 %
EOS (ABSOLUTE): 0.1 10*3/uL (ref 0.0–0.4)
Eos: 1 %
Hematocrit: 35.8 % (ref 34.0–46.6)
Hemoglobin: 10.7 g/dL — ABNORMAL LOW (ref 11.1–15.9)
Immature Grans (Abs): 0 10*3/uL (ref 0.0–0.1)
Immature Granulocytes: 0 %
Lymphocytes Absolute: 0.8 10*3/uL (ref 0.7–3.1)
Lymphs: 9 %
MCH: 29.2 pg (ref 26.6–33.0)
MCHC: 29.9 g/dL — ABNORMAL LOW (ref 31.5–35.7)
MCV: 98 fL — ABNORMAL HIGH (ref 79–97)
Monocytes Absolute: 0.6 10*3/uL (ref 0.1–0.9)
Monocytes: 7 %
Neutrophils Absolute: 7.2 10*3/uL — ABNORMAL HIGH (ref 1.4–7.0)
Neutrophils: 82 %
Platelets: 513 10*3/uL — ABNORMAL HIGH (ref 150–450)
RBC: 3.66 x10E6/uL — ABNORMAL LOW (ref 3.77–5.28)
RDW: 16.9 % — ABNORMAL HIGH (ref 11.7–15.4)
WBC: 8.7 10*3/uL (ref 3.4–10.8)

## 2023-08-16 LAB — IRON,TIBC AND FERRITIN PANEL
Ferritin: 180 ng/mL — ABNORMAL HIGH (ref 15–150)
Iron Saturation: >97 % (ref 15–55)
Iron: 659 ug/dL (ref 27–139)
Total Iron Binding Capacity: <676 ug/dL (ref 250–450)
UIBC: <17 ug/dL — ABNORMAL LOW (ref 118–369)

## 2023-08-16 LAB — CELIAC DISEASE AB SCREEN W/RFX
Antigliadin Abs, IgA: 8 U (ref 0–19)
IgA/Immunoglobulin A, Serum: 135 mg/dL (ref 64–422)
Transglutaminase IgA: <2 U/mL (ref 0–3)

## 2023-08-17 ENCOUNTER — Other Ambulatory Visit (INDEPENDENT_AMBULATORY_CARE_PROVIDER_SITE_OTHER): Payer: Self-pay | Admitting: Nurse Practitioner

## 2023-08-17 ENCOUNTER — Encounter: Payer: Self-pay | Admitting: Vascular Surgery

## 2023-08-17 ENCOUNTER — Other Ambulatory Visit: Payer: Self-pay

## 2023-08-17 ENCOUNTER — Encounter
Admission: RE | Admit: 2023-08-17 | Discharge: 2023-08-17 | Disposition: A | Discharge status: Home / Self Care | Payer: PPO | Source: Ambulatory Visit | Attending: Vascular Surgery | Admitting: Vascular Surgery

## 2023-08-17 DIAGNOSIS — Z01818 Encounter for other preprocedural examination: Secondary | ICD-10-CM | POA: Diagnosis not present

## 2023-08-17 DIAGNOSIS — Z01810 Encounter for preprocedural cardiovascular examination: Secondary | ICD-10-CM

## 2023-08-17 DIAGNOSIS — I70213 Atherosclerosis of native arteries of extremities with intermittent claudication, bilateral legs: Secondary | ICD-10-CM | POA: Diagnosis not present

## 2023-08-17 HISTORY — DX: Palpitations: R00.2

## 2023-08-17 HISTORY — DX: Prediabetes: R73.03

## 2023-08-17 LAB — TYPE AND SCREEN
ABO/RH(D): A POS
Antibody Screen: NEGATIVE

## 2023-08-17 LAB — BASIC METABOLIC PANEL
Anion gap: 9 (ref 5–15)
BUN: 20 mg/dL (ref 8–23)
CO2: 23 mmol/L (ref 22–32)
Calcium: 8.7 mg/dL — ABNORMAL LOW (ref 8.9–10.3)
Chloride: 108 mmol/L (ref 98–111)
Creatinine, Ser: 0.69 mg/dL (ref 0.44–1.00)
GFR, Estimated: >60 mL/min (ref >60)
Glucose, Bld: 117 mg/dL — ABNORMAL HIGH (ref 70–99)
Potassium: 4.5 mmol/L (ref 3.5–5.1)
Sodium: 140 mmol/L (ref 135–145)

## 2023-08-17 LAB — CBC WITH DIFFERENTIAL/PLATELET
Abs Immature Granulocytes: 0.03 10*3/uL (ref 0.00–0.07)
Basophils Absolute: 0.1 10*3/uL (ref 0.0–0.1)
Basophils Relative: 1 %
Eosinophils Absolute: 0 10*3/uL (ref 0.0–0.5)
Eosinophils Relative: 1 %
HCT: 33 % — ABNORMAL LOW (ref 36.0–46.0)
Hemoglobin: 10.2 g/dL — ABNORMAL LOW (ref 12.0–15.0)
Immature Granulocytes: 0 %
Lymphocytes Relative: 5 %
Lymphs Abs: 0.4 10*3/uL — ABNORMAL LOW (ref 0.7–4.0)
MCH: 29.8 pg (ref 26.0–34.0)
MCHC: 30.9 g/dL (ref 30.0–36.0)
MCV: 96.5 fL (ref 80.0–100.0)
Monocytes Absolute: 0.6 10*3/uL (ref 0.1–1.0)
Monocytes Relative: 7 %
Neutro Abs: 7.3 10*3/uL (ref 1.7–7.7)
Neutrophils Relative %: 86 %
Platelets: 437 10*3/uL — ABNORMAL HIGH (ref 150–400)
RBC: 3.42 MIL/uL — ABNORMAL LOW (ref 3.87–5.11)
RDW: 21.4 % — ABNORMAL HIGH (ref 11.5–15.5)
Smear Review: NORMAL
WBC: 8.4 10*3/uL (ref 4.0–10.5)
nRBC: 0 % (ref 0.0–0.2)

## 2023-08-17 NOTE — Patient Instructions (Signed)
 Your procedure is scheduled on: Jan 15,2025 Wednesday Report to the Registration Desk on the 1st floor of [facility]. To find out your arrival time, please call [phone] between 1PM - 3PM on: Tuesday, Jan 14/2025  If your arrival time is 6:00 am, do not arrive before that time as the [facility] entrance doors do not open until 6:00 am.  REMEMBER: Instructions that are not followed completely may result in serious medical risk, up to and including death; or upon the discretion of your surgeon and anesthesiologist your surgery may need to be rescheduled.  Do not eat food after midnight the night before surgery.  No gum chewing or hard candies.      One week prior to surgery: Stop Anti-inflammatories (NSAIDS) such as Advil, Aleve, Ibuprofen, Motrin, Naproxen, Naprosyn and Aspirin  based products such as Excedrin, Goody's Powder, BC Powder. Stop ANY OVER THE COUNTER supplements until after surgery.  You may however, continue to take Tylenol  if needed for pain up until the day of surgery.    **Follow recommendations regarding stopping blood thinners.**      Continue aspirin  but don't take it on day of surgery  Continue taking all of your other prescription medications up until the day of surgery.  ON THE DAY OF SURGERY ONLY TAKE THESE MEDICATIONS WITH SIPS OF WATER :  metoprolol  succinate 2. omeprazole  (PRILOSEC )    No Alcohol for 24 hours before or after surgery.  No Smoking including e-cigarettes for 24 hours before surgery.  No chewable tobacco products for at least 6 hours before surgery.  No nicotine  patches on the day of surgery.  Do not use any recreational drugs for at least a week (preferably 2 weeks) before your surgery.  Please be advised that the combination of cocaine and anesthesia may have negative outcomes, up to and including death. If you test positive for cocaine, your surgery will be cancelled.  On the morning of surgery brush your teeth with  toothpaste and water , you may rinse your mouth with mouthwash if you wish. Do not swallow any toothpaste or mouthwash.  Use CHG Soap or wipes as directed on instruction sheet.-provided for you   Do not wear jewelry, make-up, hairpins, clips or nail polish.  For welded (permanent) jewelry: bracelets, anklets, waist bands, etc.  Please have this removed prior to surgery.  If it is not removed, there is a chance that hospital personnel will need to cut it off on the day of surgery.  Do not wear lotions, powders, or perfumes.   Do not shave body hair from the neck down 48 hours before surgery.  Contact lenses, hearing aids and dentures may not be worn into surgery.  Do not bring valuables to the hospital. [facility] is not responsible for any missing/lost belongings or valuables.   Notify your doctor if there is any change in your medical condition (cold, fever, infection).  Wear comfortable clothing (specific to your surgery type) to the hospital.  After surgery, you can help prevent lung complications by doing breathing exercises.  Take deep breaths and cough every 1-2 hours. Your doctor may order a device called an Incentive Spirometer to help you take deep breaths.  If you are being admitted to the hospital overnight, leave your suitcase in the car. After surgery it may be brought to your room.   If you are being discharged the day of surgery, you will not be allowed to drive home. You will need a responsible individual to drive  you home and stay with you for 24 hours after surgery.    Please call the Pre-admissions Testing Dept. at [phone] if you have any questions about these instructions.  Surgery Visitation Policy:  Patients having surgery or a procedure may have two visitors.  Children under the age of 30 must have an adult with them who is not the patient.  Inpatient Visitation:    Visiting hours are 7 a.m. to 8 p.m. Up to four visitors are allowed at one time  in a patient room. The visitors may rotate out with other people during the day.  One visitor age 47 or older may stay with the patient overnight and must be in the room by 8 p.m.      Preparing for Surgery with CHLORHEXIDINE  GLUCONATE (CHG) Soap  Chlorhexidine  Gluconate (CHG) Soap  o An antiseptic cleaner that kills germs and bonds with the skin to continue killing germs even after washing  o Used for showering the night before surgery and morning of surgery  Before surgery, you can play an important role by reducing the number of germs on your skin.  CHG (Chlorhexidine  gluconate) soap is an antiseptic cleanser which kills germs and bonds with the skin to continue killing germs even after washing.  Please do not use if you have an allergy to CHG or antibacterial soaps. If your skin becomes reddened/irritated stop using the CHG.  1. Shower the NIGHT BEFORE SURGERY and the MORNING OF SURGERY with CHG soap.  2. If you choose to wash your hair, wash your hair first as usual with your normal shampoo.  3. After shampooing, rinse your hair and body thoroughly to remove the shampoo.  4. Use CHG as you would any other liquid soap. You can apply CHG directly to the skin and wash gently with a scrungie or a clean washcloth.  5. Apply the CHG soap to your body only from the neck down. Do not use on open wounds or open sores. Avoid contact with your eyes, ears, mouth, and genitals (private parts). Wash face and genitals (private parts) with your normal soap.  6. Wash thoroughly, paying special attention to the area where your surgery will be performed.  7. Thoroughly rinse your body with warm water .  8. Do not shower/wash with your normal soap after using and rinsing off the CHG soap.  9. Pat yourself dry with a clean towel.  10. Wear clean pajamas to bed the night before surgery.  12. Place clean sheets on your bed the night of your first shower and do not sleep with pets.  13. Shower  again with the CHG soap on the day of surgery prior to arriving at the hospital.  14. Do not apply any deodorants/lotions/powders.  15. Please wear clean clothes to the hospital.

## 2023-08-22 ENCOUNTER — Telehealth: Payer: Self-pay

## 2023-08-22 ENCOUNTER — Telehealth (INDEPENDENT_AMBULATORY_CARE_PROVIDER_SITE_OTHER): Payer: Self-pay

## 2023-08-22 ENCOUNTER — Inpatient Hospital Stay: Payer: PPO

## 2023-08-22 VITALS — BP 110/67 | HR 68 | Temp 97.6°F | Resp 16

## 2023-08-22 DIAGNOSIS — R197 Diarrhea, unspecified: Secondary | ICD-10-CM | POA: Diagnosis not present

## 2023-08-22 DIAGNOSIS — D500 Iron deficiency anemia secondary to blood loss (chronic): Secondary | ICD-10-CM

## 2023-08-22 DIAGNOSIS — D509 Iron deficiency anemia, unspecified: Secondary | ICD-10-CM | POA: Diagnosis not present

## 2023-08-22 MED ORDER — IRON SUCROSE 20 MG/ML IV SOLN
200.0000 mg | Freq: Once | INTRAVENOUS | Status: AC
  Administered 2023-08-22: 200 mg via INTRAVENOUS
  Filled 2023-08-22: qty 10

## 2023-08-22 MED ORDER — SODIUM CHLORIDE 0.9% FLUSH
10.0000 mL | Freq: Once | INTRAVENOUS | Status: AC | PRN
Start: 2023-08-22 — End: 2023-08-22
  Administered 2023-08-22: 10 mL
  Filled 2023-08-22: qty 10

## 2023-08-22 NOTE — Telephone Encounter (Signed)
 Patient left a message:  Patient called stating she woke up this morning with a runny nose , no fever or coughing. patient stated she took an allergy pill and its better. patient is wanting to know if this will affect her surgery on 08/24/23. please advise. Thank you  Per Dr. Marea:   No that is fine   Patient was informed of Dr. Fransisca recommendation.

## 2023-08-22 NOTE — Progress Notes (Signed)
 Declined 30 minute post-observation. Aware of risks. Vitals stable at discharge.

## 2023-08-22 NOTE — Patient Instructions (Signed)
 Iron Sucrose Injection What is this medication? IRON SUCROSE (EYE ern SOO krose) treats low levels of iron (iron deficiency anemia) in people with kidney disease. Iron is a mineral that plays an important role in making red blood cells, which carry oxygen from your lungs to the rest of your body. This medicine may be used for other purposes; ask your health care provider or pharmacist if you have questions. COMMON BRAND NAME(S): Venofer What should I tell my care team before I take this medication? They need to know if you have any of these conditions: Anemia not caused by low iron levels Heart disease High levels of iron in the blood Kidney disease Liver disease An unusual or allergic reaction to iron, other medications, foods, dyes, or preservatives Pregnant or trying to get pregnant Breastfeeding How should I use this medication? This medication is for infusion into a vein. It is given in a hospital or clinic setting. Talk to your care team about the use of this medication in children. While this medication may be prescribed for children as young as 2 years for selected conditions, precautions do apply. Overdosage: If you think you have taken too much of this medicine contact a poison control center or emergency room at once. NOTE: This medicine is only for you. Do not share this medicine with others. What if I miss a dose? Keep appointments for follow-up doses. It is important not to miss your dose. Call your care team if you are unable to keep an appointment. What may interact with this medication? Do not take this medication with any of the following: Deferoxamine Dimercaprol Other iron products This medication may also interact with the following: Chloramphenicol Deferasirox This list may not describe all possible interactions. Give your health care provider a list of all the medicines, herbs, non-prescription drugs, or dietary supplements you use. Also tell them if you smoke,  drink alcohol, or use illegal drugs. Some items may interact with your medicine. What should I watch for while using this medication? Visit your care team regularly. Tell your care team if your symptoms do not start to get better or if they get worse. You may need blood work done while you are taking this medication. You may need to follow a special diet. Talk to your care team. Foods that contain iron include: whole grains/cereals, dried fruits, beans, or peas, leafy green vegetables, and organ meats (liver, kidney). What side effects may I notice from receiving this medication? Side effects that you should report to your care team as soon as possible: Allergic reactions--skin rash, itching, hives, swelling of the face, lips, tongue, or throat Low blood pressure--dizziness, feeling faint or lightheaded, blurry vision Shortness of breath Side effects that usually do not require medical attention (report to your care team if they continue or are bothersome): Flushing Headache Joint pain Muscle pain Nausea Pain, redness, or irritation at injection site This list may not describe all possible side effects. Call your doctor for medical advice about side effects. You may report side effects to FDA at 1-800-FDA-1088. Where should I keep my medication? This medication is given in a hospital or clinic. It will not be stored at home. NOTE: This sheet is a summary. It may not cover all possible information. If you have questions about this medicine, talk to your doctor, pharmacist, or health care provider.  2024 Elsevier/Gold Standard (2022-12-31 00:00:00)

## 2023-08-22 NOTE — Telephone Encounter (Signed)
 Called and spoke with patient about her iron treatment today at 1 pm, she was concerned about the clot surgery she is having on Wednesday

## 2023-08-23 ENCOUNTER — Encounter: Payer: Self-pay | Admitting: Vascular Surgery

## 2023-08-23 DIAGNOSIS — K439 Ventral hernia without obstruction or gangrene: Secondary | ICD-10-CM

## 2023-08-23 DIAGNOSIS — R197 Diarrhea, unspecified: Secondary | ICD-10-CM | POA: Diagnosis not present

## 2023-08-23 MED ORDER — CEFAZOLIN SODIUM-DEXTROSE 2-4 GM/100ML-% IV SOLN
2.0000 g | INTRAVENOUS | Status: AC
  Administered 2023-08-24 (×2): 2 g via INTRAVENOUS

## 2023-08-23 MED ORDER — ONDANSETRON HCL 4 MG/2ML IJ SOLN
4.0000 mg | Freq: Four times a day (QID) | INTRAMUSCULAR | Status: DC | PRN
  Administered 2023-08-24 (×2): 4 mg via INTRAVENOUS

## 2023-08-23 MED ORDER — ORAL CARE MOUTH RINSE: 15.0000 mL | Freq: Once | OROMUCOSAL | Status: AC

## 2023-08-23 MED ORDER — CHLORHEXIDINE GLUCONATE CLOTH 2 % EX PADS
6.0000 | MEDICATED_PAD | Freq: Once | CUTANEOUS | Status: AC
  Administered 2023-08-23: 6 via TOPICAL

## 2023-08-23 MED ORDER — CHLORHEXIDINE GLUCONATE 0.12 % MT SOLN
15.0000 mL | Freq: Once | OROMUCOSAL | Status: AC
  Administered 2023-08-24: 15 mL via OROMUCOSAL

## 2023-08-23 MED ORDER — CHLORHEXIDINE GLUCONATE CLOTH 2 % EX PADS
6.0000 | MEDICATED_PAD | Freq: Once | CUTANEOUS | Status: AC
  Administered 2023-08-24: 6 via TOPICAL

## 2023-08-23 NOTE — Progress Notes (Signed)
 Perioperative / Anesthesia Services  Pre-Admission Testing Clinical Review / Pre-Operative Anesthesia Consult  Date: 08/23/23  Patient Demographics:  Name: Yolanda Jimenez DOB: [birth year] MRN:   [id number]  Planned Surgical Procedure(s):    Case: [id number] Date/Time: 08/24/23 0715   Procedures:      ENDARTERECTOMY FEMORAL (Bilateral)     APPLICATION OF CELL SAVER   Anesthesia type: General   Pre-op diagnosis: ASO WITH CLAUDICATION   Location: [facility] OR ROOM 08 / [facility]   Surgeons: Marea Selinda RAMAN, MD      NOTE: Available PAT nursing documentation and vital signs have been reviewed. Clinical nursing staff has updated patient's PMH/PSHx, current medication list, and drug allergies/intolerances to ensure comprehensive history available to assist in medical decision making as it pertains to the aforementioned surgical procedure and anticipated anesthetic course. Extensive review of available clinical information personally performed. [facility] and PSHx updated with any diagnoses/procedures that  may have been inadvertently omitted during his intake with the pre-admission testing department's nursing staff.  Clinical Discussion:  Yolanda Jimenez is a 78 y.o. female who is submitted for pre-surgical anesthesia review and clearance prior to her undergoing the above procedure. Patient is a Current Smoker (27.5 pack years). Pertinent PMH includes: CAD, aortic stenosis, atrial fibrillation, palpitations, cardiac murmur, PVD, aortic atherosclerosis, BILATERAL carotid artery disease, HLD, diet-controlled T2DM, COPD, asthma, GERD (on daily PPI), Barrett's esophagus, IDA, small bowel AVM, glaucoma, anxiety, depression, insomnia.  Patient is followed by cardiology Jodeen, MD). She was last seen in the cardiology clinic on 05/25/2023; notes reviewed. At the time of her clinic visit, patient doing well overall from a cardiovascular perspective. Patient has chronic exertional dyspnea  that is reported to be stable and at baseline. Patient also makes mention of infrequent episodes of nocturnal palpitations that are short lived and self limiting. Patient denied any chest pain, PND, orthopnea, significant peripheral edema, weakness, fatigue, vertiginous symptoms, or presyncope/syncope. Patient with a past medical history significant for cardiovascular diagnoses. Documented physical exam was grossly benign, providing no evidence of acute exacerbation and/or decompensation of the patient's known cardiovascular conditions.  Patient with a known history of BILATERAL carotid artery disease. Degree of stenosis has been stable since time of diagnosis. Most recent assessment was on 10/22/2022, at which time there was 1-39% stenosis noted in the patient's BILATERAL internal carotid arteries.   Patient has undergone multiple CT studies that have all revealed significant coronary calcifications. Most recent cardiac PET scan was performed on 05/19/2023 that demonstrated a normal left ventricular systolic function with a hyperdynamic LVEF of 71%. Severe coronary calcium  was observed on attenuation correction imaging, with significant depositions isolated manly within the LAD. There is no evidence of reversible ischemia or infarction. Study felt to be intermediate risk due to mildly reduced myocardial blood flow reserve, which may represent microvascular disease vs. multivessel CAD.   Myocardial perfusion imaging study was performed on 05/03/2023 revealing a normal left ventricular systolic function with a hyperdynamic LVEF of 69%.  There were no regional wall motion abnormalities.  SPECT images demonstrated a small reversible perfusion abnormality in the mid anteroseptal/anterior region on stress images..  Findings suggestive of possible ischemia versus artifact due to increased subdiaphragmatic activity.  Stress test determined to be equivocal.  Most recent TTE was performed on 05/03/2023 revealing a  normal left ventricular systolic function with an EF of >55%.  There were no regional wall motion abnormalities.  GLS abnormal at -16%.  Right ventricular size and  function normal.  Peak RVSP 39 mmHg. There was trivial mitral and pulmonary, in addition to mild tricuspid valve regurgitation.  Aortic valve mildly thickened and moderately stenotic with a mean transvalvular pressure gradient of 12 mmHg; AVA (VTI) 1.3 cm.  All other gradients normal.  Aorta normal in size with no evidence of aneurysmal dilatation.  Patient with an atrial fibrillation diagnosis; CHA2DS2-VASc Score = 5 (age x 2, sex, vascular disease history). Her rate and rhythm are currently being maintained on oral metoprolol  clinic.  Patient is not currently taking any type of oral anticoagulation therapy at this time.  Blood pressure well-controlled at 128/70 mmHg on currently prescribed beta-blocker (metoprolol  succinate) monotherapy.  Patient is on rosuvastatin  for her HLD diagnosis and further ASCVDs mention.  T2DM well-controlled with diet and lifestyle modification alone; last HgbA1c was 4.7% when checked on 05/10/2023. Patient is able to complete all of her  ADL/IADLs without cardiovascular limitation.  Per the [facility]AS[facility], patient is able to achieve at least 4 METS of physical activity without experiencing any significant degree of angina/anginal equivalent symptoms.  No changes were made to her medication regimen.  Patient to follow-up with outpatient cardiology in 6 months or sooner if needed.  Yolanda Jimenez is scheduled for ENDARTERECTOMY FEMORAL (Bilateral) on 08/24/2023 with Dr. Selinda Gu, MD.  Given patient's past medical history significant for cardiovascular diagnoses, presurgical cardiac clearance was sought by the PAT team. Per cardiology, this patient is optimized for surgery and may proceed with the planned procedural course with a MODERATE risk of significant perioperative cardiovascular complications.  In review of the  patient's chart, it is noted that she is on daily oral antithrombotic therapy.  Given the nature of patient's procedure and history of cardiovascular diagnoses, vascular surgery has cleared patient to continue her daily low-dose ASA throughout her perioperative course.  Patient has been updated on recommendations by the PAT staff.  Patient reports previous perioperative complications with anesthesia in the past.  Patient has experienced (+) delayed emergence from anesthesia in the past.  Additionally, patient experiences (+) emergence delirium citing that she gets really loopy after surgery. In review her EMR, it is noted that patient underwent a general/neuraxial anesthetic course here at [facility] ([facility]) in 12/2021 without documented complications.      08/22/2023    1:13 PM 08/22/2023    1:00 PM 08/17/2023    9:59 AM  Vitals with BMI  Systolic 110 124 883  Diastolic 67 83 68  Pulse 68 88 57   Providers/Specialists:  NOTE: Primary physician provider listed below. Patient may have been seen by APP or partner within same practice.   PROVIDER ROLE / SPECIALTY LAST SHERLEAN Gu Selinda GORMAN, MD Vascular Surgery (Surgeon) 08/05/2023  Cleotilde Oneil FALCON, MD Primary Care Provider 05/17/2023  Wilburn Fillers, MD Cardiology 05/25/2023   Allergies:   Allergies  Allergen Reactions   Codeine     felt drunk, crazy   Hydrocodone Other (See Comments)   Hydrocodone-Acetaminophen  Other (See Comments)    Confusion/delirium   Lidocaine      Heart racing, increased BP - from local at dentist (likely epi)   Nsaids Other (See Comments)    Avoids due to IBS symptoms (bleeding symptoms)   Oxycodone      Confusion/delirium   Current Home Medications:   No current facility-administered medications for this encounter.    acetaminophen  (TYLENOL ) 500 MG tablet   Ascorbic Acid  (VITAMIN C ) 1000 MG tablet   aspirin  EC  81 MG tablet   Cholecalciferol  125 MCG (5000 UT) TABS    cyanocobalamin  1000 MCG tablet   denosumab  (PROLIA ) 60 MG/ML SOSY injection   diazepam  (VALIUM ) 5 MG tablet   Ferrous Gluconate  239 (27 Fe) MG TABS   fluticasone  (FLONASE ) 50 MCG/ACT nasal spray   loperamide  (IMODIUM ) 2 MG capsule   metoprolol  succinate (TOPROL -XL) 25 MG 24 hr tablet   Multiple Vitamins-Minerals (PRESERVISION AREDS 2 PO)   omeprazole  (PRILOSEC ) 40 MG capsule   rosuvastatin  (CRESTOR ) 20 MG tablet   venlafaxine  (EFFEXOR ) 25 MG tablet   History:   Past Medical History:  Diagnosis Date   A-fib (HCC)    a.) CHA2DS2-VASc = 5 (age x2, sex, vascular disease history, T2DM) as of 08/23/2023; b.) cardiac rate/rhythm maintained on oral metoprolol  succinate; no chronic OAC   Abnormal vaginal Pap smear 2015   Anxiety    a.) on BZO PRN (diazepam )   Aortic atherosclerosis (HCC)    Aortic stenosis 05/03/2023   a.) TTE 05/03/2023: mild AS (MPG 12 mmHg; AVA = 1.3 cm2)   Arthritis of spine    Asthma    Barrett esophagus    Benign hematuria 08/29/2014   W/u neg  Normal renal ultrasound 2/19  Cystoscopy normal 2019, [place]  CT -9/24     Bilateral carotid artery disease (HCC)    a.) carotid dopplers 10/13/2016, 10/24/2018, 10/26/2019, 10/21/2020, 10/22/2022: 1-39% BICA   C. difficile diarrhea    CAD (coronary artery disease)    a.) CT chest 07/12/2018: LAD calcifications; b.) CT CAP 11/18/2022: 3 vessel CAD; c.) cPET 05/19/2023: severe 4 vessel coronary calcs mainly in LAD distribution; d.) cPET 05/03/2023: small/mild reversible defect in mid-anterosep/ant region   Complication of anesthesia    a.) delayed emergence; b.) emergence delirium; I get really loopy after surgery   COPD (chronic obstructive pulmonary disease) (HCC)    DDD (degenerative disc disease), lumbosacral    Depression    Diverticulosis    Epigastric hernia    GERD (gastroesophageal reflux disease)    Glaucoma    Heart murmur    Heart palpitations    History of bilateral cataract extraction 2015   History  of recurrent UTIs    Hx of adenomatous colonic polyps    Hyperlipidemia    IBS (irritable bowel syndrome)    Insomnia    Iron  deficiency anemia    Kidney cysts    Long term current use of aspirin     Macular degeneration of left eye    Osteoporosis    a.) on RANKLi (denosumab )   PVD (peripheral vascular disease) with claudication (HCC)    Small bowel arteriovenous malformation 2007   a.) noted on VCE in 2007   Type 2 diabetes, diet controlled (HCC)    Wears dentures    partial lower   Past Surgical History:  Procedure Laterality Date   BUNIONECTOMY Right    CATARACT EXTRACTION Bilateral    CERVICAL CONE BIOPSY     CHOLECYSTECTOMY     COLONOSCOPY     COLONOSCOPY WITH PROPOFOL  N/A 01/22/2019   Procedure: COLONOSCOPY WITH BIOPSY;  Surgeon: Jinny Carmine, MD;  Location: [facility];  Service: Endoscopy;  Laterality: N/A;  diabetic - diet controlled   COLONOSCOPY WITH PROPOFOL  N/A 06/27/2020   Procedure: COLONOSCOPY WITH BIOPSY;  Surgeon: Jinny Carmine, MD;  Location: [facility];  Service: Endoscopy;  Laterality: N/A;  priority 4   ESOPHAGOGASTRODUODENOSCOPY (EGD) WITH PROPOFOL  N/A 08/29/2017   Procedure: ESOPHAGOGASTRODUODENOSCOPY (EGD) WITH PROPOFOL ;  Surgeon: Jinny Carmine, MD;  Location: [facility];  Service: Endoscopy;  Laterality: N/A;   FOOT SURGERY     right 2nd toe   HIP ARTHROPLASTY Left 05/16/2018   Procedure: ARTHROPLASTY BIPOLAR HIP (HEMIARTHROPLASTY);  Surgeon: Edie Norleen PARAS, MD;  Location: [facility] ORS;  Service: Orthopedics;  Laterality: Left;   LOWER EXTREMITY ANGIOGRAPHY Left 04/14/2023   Procedure: Lower Extremity Angiography;  Surgeon: Marea Selinda RAMAN, MD;  Location: [facility];  Service: Cardiovascular;  Laterality: Left;   ORIF PERIPROSTHETIC FRACTURE Left 12/10/2021   Procedure: OPEN REDUCTION INTERNAL FIXATION OF A LEFT GREATER TROCHANTERIC FRACTURE;  Surgeon: Edie Norleen PARAS, MD;  Location: [facility] ORS;  Service: Orthopedics;  Laterality:  Left;   ORIF WRIST FRACTURE Right 10/07/2020   Procedure: OPEN REDUCTION INTERNAL FIXATION (ORIF) WRIST FRACTURE;  Surgeon: Kathlynn Sharper, MD;  Location: [facility] ORS;  Service: Orthopedics;  Laterality: Right;   POLYPECTOMY N/A 01/22/2019   Procedure: POLYPECTOMY;  Surgeon: Jinny Carmine, MD;  Location: [facility];  Service: Endoscopy;  Laterality: N/A;  2 Clips placed at polyp removal site in ascending colon   POLYPECTOMY N/A 06/27/2020   Procedure: POLYPECTOMY;  Surgeon: Jinny Carmine, MD;  Location: [facility];  Service: Endoscopy;  Laterality: N/A;   TUBAL LIGATION     UPPER GASTROINTESTINAL ENDOSCOPY     Family History  Problem Relation Age of Onset   Diabetes Mother    Heart disease Mother    Hypertension Mother    Diabetes Son    Diabetes Sister    Inflammatory bowel disease Father        diverticulitis   Multiple myeloma Father    Hypertension Son    Diabetes Maternal Grandmother    Colon cancer Neg Hx    Esophageal cancer Neg Hx    Rectal cancer Neg Hx    Stomach cancer Neg Hx    Breast cancer Neg Hx    Social History   Tobacco Use   Smoking status: Every Day    Current packs/day: 0.50    Average packs/day: 0.5 packs/day for 55.0 years (27.5 ttl pk-yrs)    Types: Cigarettes   Smokeless tobacco: Never   Tobacco comments:    she has patches to start just not started yet. smoked since teenager.  Substance Use Topics   Alcohol use: Yes    Alcohol/week: 2.0 standard drinks of alcohol    Types: 2 Glasses of wine per week    Comment: occ wine 1-2 weekly   Pertinent Clinical Results:  LABS:  Lab Results  Component Value Date   WBC 8.4 08/17/2023   HGB 10.2 (L) 08/17/2023   HCT 33.0 (L) 08/17/2023   MCV 96.5 08/17/2023   PLT 437 (H) 08/17/2023   Lab Results  Component Value Date   NA 140 08/17/2023   K 4.5 08/17/2023   CO2 23 08/17/2023   GLUCOSE 117 (H) 08/17/2023   BUN 20 08/17/2023   CREATININE 0.69 08/17/2023   CALCIUM  8.7 (L) 08/17/2023    GFRNONAA >60 08/17/2023    ECG: Date: 08/17/2023  Time ECG obtained: 1102 AM Rate: 57 bpm Rhythm: sinus bradycardia Axis (leads I and aVF): right Intervals: PR 134 ms. QRS 64 ms. QTc 401 ms. ST segment and T wave changes: No evidence of acute T wave abnormalities or significant ST segment elevation or depression. Comparison: Similar to previous tracing obtained on 12/12/2021   IMAGING / PROCEDURES: NM PET CT CARDIAC PERFUSION MULTI W/ABSOLUTE BLOOD FLOW performed on  05/19/2023 The study demonstrates normal perfusion. The study is intermediate risk due to mildly reduced myocardial blood flow reserve, which may represent microvascular disease vs. multivessel CAD. LV perfusion is normal. There is no evidence of ischemia. There is no evidence of infarction. Rest left ventricular function is normal. Rest EF: 71%. Stress left ventricular function is normal. Stress EF: 77%. End diastolic cavity size is normal. End systolic cavity size is normal. No evidence of transient ischemic dilation (TID) noted. Myocardial blood flow was computed to be 0.45ml/g/min at rest and 1.87ml/g/min at stress. Global myocardial blood flow reserve was 1.89 and was mildly abnormal. Coronary calcium  was present on the attenuation correction CT images. Severe coronary calcifications were present. Coronary calcifications were present in the left anterior descending artery distribution(s).  MYOCARDIAL PERFUSION IMAGING STUDY (LEXISCAN ) performed on 05/03/2023 Poor study quality due to increased subdiaphragmatic activity encroaching  onto heart.  Stress test is equivocal.  There is small size, mild intensity, reversible defect noted in mid anterior/anteroseptal wall which can suggest ischemia versus artifact due to increased subdiaphragmatic activity on to inferior/lateral wall of heart reducing counts in rest of myocardium.  Left ventricle normal in size and systolic function.  No significant TID.  Consider alternate  stress test like PET/CT stress test or dobutamine echocardiogram for further evaluation.   TRANSTHORACIC ECHOCARDIOGRAM performed on 09/24/202 Normal left ventricular systolic function with EF of >55% Mild LVH Normal LA pressures with normal diastolic function Normal right ventricular size and function Peak RVSP = 39 mmHg Trivial MR and PR Mild TR Mild aortic valve stenosis with a mean pressure gradient of 12 mmHg; AVA (VTI) = 1.3 cm  CT HEMATURIA WORKUP performed on 04/22/2023 No CT findings to explain hematuria. No evidence of urinary tract calculus, mass, or hydronephrosis. Limited opacification of the distal ureters. Within this limitation, no urinary tract filling defect on delayed phase imaging. Sigmoid diverticulosis without evidence of acute diverticulitis. Fat containing midline epigastric hernia. Aortic atherosclerosis  VAS US  LOWER EXTREMITY ARTERIAL DUPLEX performed on 03/23/2023 Right: 50-74% stenosis noted in the common femoral artery.  Left: 50-74% stenosis noted in the iliac segment.  50-74% stenosis noted in the common femoral artery.  Hemodynamically significant irregular calcified plaque seen in the iliac and common femoral artery.   VAS US  ABI WITH/WO TBI performed on 03/23/2023 Resting right ankle-brachial index is within normal range (0.99). The right toe-brachial index is abnormal (0.38) Resting left ankle-brachial index indicates moderate left lower extremity arterial disease (0.61). The left toe-brachial index is abnormal (0.05).  CT CHEST ABDOMEN PELVIS WO CONTRAST performed on 11/18/2022 No acute intrathoracic, intra-abdominal, intrapelvic abnormality with limited evaluation on this noncontrast study. Colonic diverticulosis with no acute diverticulitis. Aortic atherosclerosis  Four-vessel coronary artery calcifications.  MR LUMBAR SPINE WO CONTRAST performed on 11/15/2022 Diffuse lumbar disc degeneration with mild multilevel lateral recess and neural  foraminal stenosis  Impression and Plan:  Yolanda Jimenez has been referred for pre-anesthesia review and clearance prior to her undergoing the planned anesthetic and procedural courses. Available labs, pertinent testing, and imaging results were personally reviewed by me in preparation for upcoming operative/procedural course. [facility] medical record has been updated following extensive record review and patient interview with PAT staff.   This patient has been appropriately cleared by cardiology with an overall MODERATE risk of experiencing significant perioperative cardiovascular complications. Based on clinical review performed today (08/23/23), barring any significant acute changes in the patient's overall condition, it is anticipated that she will be able to  proceed with the planned surgical intervention. Any acute changes in clinical condition may necessitate her procedure being postponed and/or cancelled. Patient will meet with anesthesia team (MD and/or CRNA) on the day of her procedure for preoperative evaluation/assessment. Questions regarding anesthetic course will be fielded at that time.   Pre-surgical instructions were reviewed with the patient during his PAT appointment, and questions were fielded to satisfaction by PAT clinical staff. She has been instructed on which medications that she will need to hold prior to surgery, as well as the ones that have been deemed safe/appropriate to take on the day of his procedure. As part of the general education provided by PAT, patient made aware both verbally and in writing, that she would need to abstain from the use of any illegal substances during his perioperative course. She was advised that failure to follow the provided instructions could necessitate case cancellation or result in serious perioperative complications up to and including death. Patient encouraged to contact PAT and/or her surgeon's office to discuss any questions or concerns that  may arise prior to surgery; verbalized understanding.   Dorise Pereyra, MSN, APRN, FNP-C, [facility]  Perioperative Services Nurse Practitioner Phone: [phone] Fax: [phone] 08/23/23 12:50 PM  NOTE: This note has been prepared using Dragon dictation software. Despite my best ability to proofread, there is always the potential that unintentional transcriptional errors may still occur from this process.

## 2023-08-24 ENCOUNTER — Inpatient Hospital Stay: Payer: PPO

## 2023-08-24 ENCOUNTER — Other Ambulatory Visit: Payer: Self-pay

## 2023-08-24 ENCOUNTER — Encounter
Admission: RE | Disposition: A | Discharge status: Home Health | Payer: Self-pay | Source: Home / Self Care | Attending: Vascular Surgery

## 2023-08-24 ENCOUNTER — Inpatient Hospital Stay: Payer: PPO | Admitting: Urgent Care

## 2023-08-24 ENCOUNTER — Inpatient Hospital Stay
Admission: RE | Admit: 2023-08-24 | Discharge: 2023-09-03 | DRG: 279 | Disposition: A | Discharge status: Home Health | Payer: PPO | Attending: Vascular Surgery | Admitting: Vascular Surgery

## 2023-08-24 ENCOUNTER — Encounter: Payer: Self-pay | Admitting: Vascular Surgery

## 2023-08-24 DIAGNOSIS — I6523 Occlusion and stenosis of bilateral carotid arteries: Secondary | ICD-10-CM | POA: Diagnosis present

## 2023-08-24 DIAGNOSIS — I745 Embolism and thrombosis of iliac artery: Secondary | ICD-10-CM | POA: Diagnosis not present

## 2023-08-24 DIAGNOSIS — Z885 Allergy status to narcotic agent status: Secondary | ICD-10-CM

## 2023-08-24 DIAGNOSIS — Z7902 Long term (current) use of antithrombotics/antiplatelets: Secondary | ICD-10-CM

## 2023-08-24 DIAGNOSIS — I700 Atherosclerosis of aorta: Secondary | ICD-10-CM

## 2023-08-24 DIAGNOSIS — Z860101 Personal history of adenomatous and serrated colon polyps: Secondary | ICD-10-CM

## 2023-08-24 DIAGNOSIS — J4489 Other specified chronic obstructive pulmonary disease: Secondary | ICD-10-CM | POA: Diagnosis not present

## 2023-08-24 DIAGNOSIS — H3530 Unspecified macular degeneration: Secondary | ICD-10-CM | POA: Diagnosis present

## 2023-08-24 DIAGNOSIS — Z87440 Personal history of urinary (tract) infections: Secondary | ICD-10-CM

## 2023-08-24 DIAGNOSIS — F419 Anxiety disorder, unspecified: Secondary | ICD-10-CM | POA: Diagnosis present

## 2023-08-24 DIAGNOSIS — F17210 Nicotine dependence, cigarettes, uncomplicated: Secondary | ICD-10-CM | POA: Diagnosis not present

## 2023-08-24 DIAGNOSIS — I2510 Atherosclerotic heart disease of native coronary artery without angina pectoris: Secondary | ICD-10-CM | POA: Diagnosis present

## 2023-08-24 DIAGNOSIS — D509 Iron deficiency anemia, unspecified: Secondary | ICD-10-CM | POA: Diagnosis not present

## 2023-08-24 DIAGNOSIS — E1151 Type 2 diabetes mellitus with diabetic peripheral angiopathy without gangrene: Secondary | ICD-10-CM | POA: Diagnosis not present

## 2023-08-24 DIAGNOSIS — I70219 Atherosclerosis of native arteries of extremities with intermittent claudication, unspecified extremity: Principal | ICD-10-CM | POA: Diagnosis present

## 2023-08-24 DIAGNOSIS — Z79899 Other long term (current) drug therapy: Secondary | ICD-10-CM

## 2023-08-24 DIAGNOSIS — H409 Unspecified glaucoma: Secondary | ICD-10-CM | POA: Diagnosis present

## 2023-08-24 DIAGNOSIS — Z833 Family history of diabetes mellitus: Secondary | ICD-10-CM

## 2023-08-24 DIAGNOSIS — I70201 Unspecified atherosclerosis of native arteries of extremities, right leg: Secondary | ICD-10-CM | POA: Diagnosis not present

## 2023-08-24 DIAGNOSIS — G4700 Insomnia, unspecified: Secondary | ICD-10-CM | POA: Diagnosis not present

## 2023-08-24 DIAGNOSIS — I350 Nonrheumatic aortic (valve) stenosis: Secondary | ICD-10-CM | POA: Diagnosis present

## 2023-08-24 DIAGNOSIS — I4891 Unspecified atrial fibrillation: Secondary | ICD-10-CM | POA: Diagnosis present

## 2023-08-24 DIAGNOSIS — R0902 Hypoxemia: Secondary | ICD-10-CM | POA: Diagnosis not present

## 2023-08-24 DIAGNOSIS — Z7982 Long term (current) use of aspirin: Secondary | ICD-10-CM

## 2023-08-24 DIAGNOSIS — I7589 Atheroembolism of other site: Secondary | ICD-10-CM | POA: Diagnosis not present

## 2023-08-24 DIAGNOSIS — Z884 Allergy status to anesthetic agent status: Secondary | ICD-10-CM

## 2023-08-24 DIAGNOSIS — F05 Delirium due to known physiological condition: Secondary | ICD-10-CM | POA: Diagnosis not present

## 2023-08-24 DIAGNOSIS — Z807 Family history of other malignant neoplasms of lymphoid, hematopoietic and related tissues: Secondary | ICD-10-CM

## 2023-08-24 DIAGNOSIS — Z8249 Family history of ischemic heart disease and other diseases of the circulatory system: Secondary | ICD-10-CM | POA: Diagnosis not present

## 2023-08-24 DIAGNOSIS — I70202 Unspecified atherosclerosis of native arteries of extremities, left leg: Secondary | ICD-10-CM | POA: Diagnosis not present

## 2023-08-24 DIAGNOSIS — K439 Ventral hernia without obstruction or gangrene: Secondary | ICD-10-CM

## 2023-08-24 DIAGNOSIS — F32A Depression, unspecified: Secondary | ICD-10-CM | POA: Diagnosis present

## 2023-08-24 DIAGNOSIS — E785 Hyperlipidemia, unspecified: Secondary | ICD-10-CM | POA: Diagnosis present

## 2023-08-24 DIAGNOSIS — I70223 Atherosclerosis of native arteries of extremities with rest pain, bilateral legs: Secondary | ICD-10-CM | POA: Diagnosis not present

## 2023-08-24 DIAGNOSIS — Z96642 Presence of left artificial hip joint: Secondary | ICD-10-CM | POA: Diagnosis present

## 2023-08-24 DIAGNOSIS — Z9049 Acquired absence of other specified parts of digestive tract: Secondary | ICD-10-CM

## 2023-08-24 DIAGNOSIS — K219 Gastro-esophageal reflux disease without esophagitis: Secondary | ICD-10-CM | POA: Diagnosis present

## 2023-08-24 DIAGNOSIS — R0600 Dyspnea, unspecified: Secondary | ICD-10-CM | POA: Diagnosis not present

## 2023-08-24 DIAGNOSIS — I491 Atrial premature depolarization: Secondary | ICD-10-CM | POA: Diagnosis not present

## 2023-08-24 DIAGNOSIS — I708 Atherosclerosis of other arteries: Secondary | ICD-10-CM

## 2023-08-24 DIAGNOSIS — I472 Ventricular tachycardia, unspecified: Secondary | ICD-10-CM | POA: Diagnosis not present

## 2023-08-24 DIAGNOSIS — Z886 Allergy status to analgesic agent status: Secondary | ICD-10-CM

## 2023-08-24 HISTORY — PX: INSERTION OF ILIAC STENT: SHX6256

## 2023-08-24 HISTORY — DX: Unspecified macular degeneration: H35.30

## 2023-08-24 HISTORY — DX: Type 2 diabetes mellitus without complications: E11.9

## 2023-08-24 HISTORY — DX: Disorder of arteries and arterioles, unspecified: I77.9

## 2023-08-24 HISTORY — DX: Spondylosis without myelopathy or radiculopathy, site unspecified: M47.819

## 2023-08-24 HISTORY — DX: Peripheral vascular disease, unspecified: I73.9

## 2023-08-24 HISTORY — DX: Personal history of urinary (tract) infections: Z87.440

## 2023-08-24 HISTORY — DX: Long term (current) use of aspirin: Z79.82

## 2023-08-24 HISTORY — DX: Ventral hernia without obstruction or gangrene: K43.9

## 2023-08-24 HISTORY — PX: ENDARTERECTOMY FEMORAL: SHX5804

## 2023-08-24 HISTORY — DX: Other intervertebral disc degeneration, lumbosacral region without mention of lumbar back pain or lower extremity pain: M51.379

## 2023-08-24 HISTORY — DX: Unspecified glaucoma: H40.9

## 2023-08-24 HISTORY — DX: Anxiety disorder, unspecified: F41.9

## 2023-08-24 HISTORY — PX: ABDOMINAL AORTIC ENDOVASCULAR STENT GRAFT: SHX5707

## 2023-08-24 HISTORY — DX: Atherosclerotic heart disease of native coronary artery without angina pectoris: I25.10

## 2023-08-24 LAB — CLOSTRIDIUM DIFFICILE BY PCR: Toxigenic C. Difficile by PCR: NEGATIVE

## 2023-08-24 LAB — CBC
HCT: 26.4 % — ABNORMAL LOW (ref 36.0–46.0)
Hemoglobin: 8.2 g/dL — ABNORMAL LOW (ref 12.0–15.0)
MCH: 30.1 pg (ref 26.0–34.0)
MCHC: 31.1 g/dL (ref 30.0–36.0)
MCV: 97.1 fL (ref 80.0–100.0)
Platelets: 261 10*3/uL (ref 150–400)
RBC: 2.72 MIL/uL — ABNORMAL LOW (ref 3.87–5.11)
RDW: 21.2 % — ABNORMAL HIGH (ref 11.5–15.5)
WBC: 12.5 10*3/uL — ABNORMAL HIGH (ref 4.0–10.5)
nRBC: 0 % (ref 0.0–0.2)

## 2023-08-24 LAB — CREATININE, SERUM
Creatinine, Ser: 0.67 mg/dL (ref 0.44–1.00)
GFR, Estimated: >60 mL/min (ref >60)

## 2023-08-24 LAB — GLUCOSE, CAPILLARY
Glucose-Capillary: 175 mg/dL — ABNORMAL HIGH (ref 70–99)
Glucose-Capillary: 152 mg/dL — ABNORMAL HIGH (ref 70–99)
Glucose-Capillary: 112 mg/dL — ABNORMAL HIGH (ref 70–99)

## 2023-08-24 LAB — MRSA NEXT GEN BY PCR, NASAL: MRSA by PCR Next Gen: NOT DETECTED

## 2023-08-24 SURGERY — ENDARTERECTOMY, FEMORAL
Anesthesia: General | Site: Leg Upper

## 2023-08-24 MED ORDER — VANCOMYCIN HCL 1000 MG IV SOLR
INTRAVENOUS | Status: AC
  Filled 2023-08-24: qty 20

## 2023-08-24 MED ORDER — ROCURONIUM BROMIDE 100 MG/10ML IV SOLN
INTRAVENOUS | Status: DC | PRN
  Administered 2023-08-24: 40 mg via INTRAVENOUS
  Administered 2023-08-24 (×3): 20 mg via INTRAVENOUS
  Administered 2023-08-24: 30 mg via INTRAVENOUS
  Administered 2023-08-24: 5 mg via INTRAVENOUS
  Administered 2023-08-24: 20 mg via INTRAVENOUS

## 2023-08-24 MED ORDER — MIDAZOLAM HCL 2 MG/2ML IJ SOLN
INTRAMUSCULAR | Status: AC
  Filled 2023-08-24: qty 2

## 2023-08-24 MED ORDER — TRAMADOL HCL 50 MG PO TABS
50.0000 mg | ORAL_TABLET | Freq: Four times a day (QID) | ORAL | Status: DC | PRN
  Administered 2023-08-24 – 2023-08-28 (×5): 50 mg via ORAL
  Filled 2023-08-24 (×7): qty 1

## 2023-08-24 MED ORDER — CHLORHEXIDINE GLUCONATE CLOTH 2 % EX PADS
6.0000 | MEDICATED_PAD | Freq: Every day | CUTANEOUS | Status: DC
  Administered 2023-08-24 – 2023-08-31 (×8): 6 via TOPICAL

## 2023-08-24 MED ORDER — FLUTICASONE PROPIONATE 50 MCG/ACT NA SUSP: 1.0000 | Freq: Every day | NASAL | Status: DC | PRN

## 2023-08-24 MED ORDER — VENLAFAXINE HCL 25 MG PO TABS
25.0000 mg | ORAL_TABLET | Freq: Two times a day (BID) | ORAL | Status: DC
  Administered 2023-08-24 – 2023-09-03 (×18): 25 mg via ORAL
  Filled 2023-08-24 (×21): qty 1

## 2023-08-24 MED ORDER — ACETAMINOPHEN 500 MG PO TABS
1000.0000 mg | ORAL_TABLET | Freq: Four times a day (QID) | ORAL | Status: DC | PRN
  Administered 2023-08-25 – 2023-08-30 (×4): 1000 mg via ORAL
  Filled 2023-08-24 (×4): qty 2

## 2023-08-24 MED ORDER — PROPOFOL 10 MG/ML IV BOLUS
INTRAVENOUS | Status: DC | PRN
  Administered 2023-08-24: 80 mg via INTRAVENOUS

## 2023-08-24 MED ORDER — GLYCOPYRROLATE 0.2 MG/ML IJ SOLN
INTRAMUSCULAR | Status: DC | PRN
  Administered 2023-08-24: .2 mg via INTRAVENOUS

## 2023-08-24 MED ORDER — OXYCODONE HCL 5 MG PO TABS: 5.0000 mg | ORAL_TABLET | Freq: Once | ORAL | Status: DC | PRN

## 2023-08-24 MED ORDER — ONDANSETRON HCL 4 MG/2ML IJ SOLN
4.0000 mg | Freq: Four times a day (QID) | INTRAMUSCULAR | Status: DC | PRN
Start: 2023-08-24 — End: 2023-09-03
  Administered 2023-08-24: 4 mg via INTRAVENOUS
  Filled 2023-08-24: qty 2

## 2023-08-24 MED ORDER — LIDOCAINE HCL (CARDIAC) PF 100 MG/5ML IV SOSY
PREFILLED_SYRINGE | INTRAVENOUS | Status: DC | PRN
  Administered 2023-08-24: 60 mg via INTRAVENOUS

## 2023-08-24 MED ORDER — SODIUM CHLORIDE 0.9 % IR SOLN
Status: DC | PRN
  Administered 2023-08-24: 501 mL

## 2023-08-24 MED ORDER — SORBITOL 70 % SOLN: 30.0000 mL | Freq: Every day | Status: DC | PRN

## 2023-08-24 MED ORDER — SODIUM CHLORIDE 0.9 % IV SOLN: INTRAVENOUS | Status: DC

## 2023-08-24 MED ORDER — IODIXANOL 320 MG/ML IV SOLN
INTRAVENOUS | Status: DC | PRN
  Administered 2023-08-24: 60 mL

## 2023-08-24 MED ORDER — SODIUM CHLORIDE 0.9 % IV SOLN
INTRAVENOUS | Status: DC | PRN
  Administered 2023-08-24: .1 ug/kg/min via INTRAVENOUS

## 2023-08-24 MED ORDER — OXYCODONE HCL 5 MG/5ML PO SOLN: 5.0000 mg | Freq: Once | ORAL | Status: DC | PRN

## 2023-08-24 MED ORDER — EPHEDRINE SULFATE-NACL 50-0.9 MG/10ML-% IV SOSY
PREFILLED_SYRINGE | INTRAVENOUS | Status: DC | PRN
  Administered 2023-08-24: 2.5 mg via INTRAVENOUS
  Administered 2023-08-24: 10 mg via INTRAVENOUS
  Administered 2023-08-24 (×2): 5 mg via INTRAVENOUS
  Administered 2023-08-24: 2.5 mg via INTRAVENOUS
  Administered 2023-08-24: 10 mg via INTRAVENOUS
  Administered 2023-08-24: 2.5 mg via INTRAVENOUS
  Administered 2023-08-24: 15 mg via INTRAVENOUS

## 2023-08-24 MED ORDER — MORPHINE SULFATE (PF) 2 MG/ML IV SOLN
2.0000 mg | INTRAVENOUS | Status: DC | PRN
Start: 2023-08-24 — End: 2023-09-03
  Administered 2023-08-24: 2 mg via INTRAVENOUS
  Filled 2023-08-24: qty 1

## 2023-08-24 MED ORDER — ALBUMIN HUMAN 5 % IV SOLN: INTRAVENOUS | Status: DC | PRN

## 2023-08-24 MED ORDER — DEXAMETHASONE SODIUM PHOSPHATE 10 MG/ML IJ SOLN
INTRAMUSCULAR | Status: DC | PRN
  Administered 2023-08-24: 4 mg via INTRAVENOUS

## 2023-08-24 MED ORDER — FENTANYL CITRATE (PF) 100 MCG/2ML IJ SOLN
INTRAMUSCULAR | Status: AC
  Filled 2023-08-24: qty 2

## 2023-08-24 MED ORDER — MAGNESIUM SULFATE 2 GM/50ML IV SOLN: 2.0000 g | Freq: Every day | INTRAVENOUS | Status: DC | PRN

## 2023-08-24 MED ORDER — METOPROLOL TARTRATE 5 MG/5ML IV SOLN: 2.0000 mg | INTRAVENOUS | Status: DC | PRN

## 2023-08-24 MED ORDER — VITAMIN B-12 1000 MCG PO TABS
1000.0000 ug | ORAL_TABLET | Freq: Every day | ORAL | Status: DC
  Administered 2023-08-25 – 2023-09-03 (×10): 1000 ug via ORAL
  Filled 2023-08-24 (×10): qty 1

## 2023-08-24 MED ORDER — NOREPINEPHRINE BITARTRATE 1 MG/ML IV SOLN
INTRAVENOUS | Status: DC | PRN
  Administered 2023-08-24 (×3): 2 mL via INTRAVENOUS
  Administered 2023-08-24: 1 mL via INTRAVENOUS

## 2023-08-24 MED ORDER — FAMOTIDINE IN NACL 20-0.9 MG/50ML-% IV SOLN
20.0000 mg | Freq: Two times a day (BID) | INTRAVENOUS | Status: DC
  Administered 2023-08-24 – 2023-08-25 (×3): 20 mg via INTRAVENOUS
  Filled 2023-08-24 (×4): qty 50

## 2023-08-24 MED ORDER — ALUM & MAG HYDROXIDE-SIMETH 200-200-20 MG/5ML PO SUSP: 15.0000 mL | ORAL | Status: DC | PRN

## 2023-08-24 MED ORDER — LIDOCAINE HCL (PF) 2 % IJ SOLN
INTRAMUSCULAR | Status: AC
  Filled 2023-08-24: qty 5

## 2023-08-24 MED ORDER — REMIFENTANIL HCL 1 MG IV SOLR
INTRAVENOUS | Status: AC
  Filled 2023-08-24: qty 1000

## 2023-08-24 MED ORDER — PROPOFOL 10 MG/ML IV BOLUS
INTRAVENOUS | Status: AC
  Filled 2023-08-24: qty 40

## 2023-08-24 MED ORDER — INSULIN ASPART 100 UNIT/ML IJ SOLN
2.0000 [IU] | Freq: Three times a day (TID) | INTRAMUSCULAR | Status: DC
  Administered 2023-08-25 – 2023-08-26 (×2): 2 [IU] via SUBCUTANEOUS
  Administered 2023-08-27 – 2023-08-28 (×2): 4 [IU] via SUBCUTANEOUS
  Administered 2023-08-29: 2 [IU] via SUBCUTANEOUS
  Administered 2023-08-30: 4 [IU] via SUBCUTANEOUS
  Administered 2023-08-31: 2 [IU] via SUBCUTANEOUS
  Filled 2023-08-24 (×10): qty 1

## 2023-08-24 MED ORDER — CEFAZOLIN SODIUM-DEXTROSE 2-4 GM/100ML-% IV SOLN
2.0000 g | Freq: Three times a day (TID) | INTRAVENOUS | Status: AC
  Administered 2023-08-24 – 2023-08-25 (×2): 2 g via INTRAVENOUS
  Filled 2023-08-24 (×2): qty 100

## 2023-08-24 MED ORDER — ASPIRIN 81 MG PO TBEC
81.0000 mg | DELAYED_RELEASE_TABLET | Freq: Every day | ORAL | Status: DC
  Administered 2023-08-24 – 2023-09-03 (×11): 81 mg via ORAL
  Filled 2023-08-24 (×11): qty 1

## 2023-08-24 MED ORDER — FENTANYL CITRATE (PF) 100 MCG/2ML IJ SOLN
INTRAMUSCULAR | Status: DC | PRN
  Administered 2023-08-24: 25 ug via INTRAVENOUS

## 2023-08-24 MED ORDER — GENTAMICIN SULFATE 40 MG/ML IJ SOLN
INTRAMUSCULAR | Status: DC | PRN
  Administered 2023-08-24: 80 mg

## 2023-08-24 MED ORDER — STERILE WATER FOR IRRIGATION IR SOLN
Status: DC | PRN
  Administered 2023-08-24: 500 mL

## 2023-08-24 MED ORDER — HYDRALAZINE HCL 20 MG/ML IJ SOLN: 5.0000 mg | INTRAMUSCULAR | Status: DC | PRN

## 2023-08-24 MED ORDER — HEPARIN SODIUM (PORCINE) 1000 UNIT/ML IJ SOLN
INTRAMUSCULAR | Status: DC | PRN
  Administered 2023-08-24: 5000 [IU] via INTRAVENOUS
  Administered 2023-08-24: 2000 [IU] via INTRAVENOUS

## 2023-08-24 MED ORDER — ACETAMINOPHEN 10 MG/ML IV SOLN
INTRAVENOUS | Status: DC | PRN
  Administered 2023-08-24: 1000 mg via INTRAVENOUS

## 2023-08-24 MED ORDER — POTASSIUM CHLORIDE CRYS ER 20 MEQ PO TBCR: 20.0000 meq | EXTENDED_RELEASE_TABLET | Freq: Every day | ORAL | Status: DC | PRN

## 2023-08-24 MED ORDER — HEPARIN 30,000 UNITS/1000 ML (OHS) CELLSAVER SOLUTION
Status: AC | PRN
  Administered 2023-08-24: 1

## 2023-08-24 MED ORDER — FERROUS GLUCONATE 324 (38 FE) MG PO TABS
324.0000 mg | ORAL_TABLET | Freq: Every day | ORAL | Status: DC
Start: 2023-08-25 — End: 2023-09-03
  Administered 2023-08-25 – 2023-09-03 (×10): 324 mg via ORAL
  Filled 2023-08-24 (×10): qty 1

## 2023-08-24 MED ORDER — NITROGLYCERIN IN D5W 200-5 MCG/ML-% IV SOLN
INTRAVENOUS | Status: AC
  Filled 2023-08-24: qty 250

## 2023-08-24 MED ORDER — INSULIN ASPART 100 UNIT/ML IJ SOLN: 2.0000 [IU] | INTRAMUSCULAR | Status: DC

## 2023-08-24 MED ORDER — HEPARIN SODIUM (PORCINE) 1000 UNIT/ML IJ SOLN
INTRAMUSCULAR | Status: AC
  Filled 2023-08-24: qty 10

## 2023-08-24 MED ORDER — ACETAMINOPHEN 10 MG/ML IV SOLN
INTRAVENOUS | Status: AC
  Filled 2023-08-24: qty 100

## 2023-08-24 MED ORDER — METOPROLOL SUCCINATE ER 25 MG PO TB24
25.0000 mg | ORAL_TABLET | Freq: Every day | ORAL | Status: DC
  Administered 2023-08-26 – 2023-09-03 (×9): 25 mg via ORAL
  Filled 2023-08-24 (×9): qty 1

## 2023-08-24 MED ORDER — ALBUMIN HUMAN 5 % IV SOLN
INTRAVENOUS | Status: AC
  Filled 2023-08-24: qty 250

## 2023-08-24 MED ORDER — PHENYLEPHRINE HCL-NACL 20-0.9 MG/250ML-% IV SOLN
INTRAVENOUS | Status: AC
  Filled 2023-08-24: qty 250

## 2023-08-24 MED ORDER — DOPAMINE-DEXTROSE 3.2-5 MG/ML-% IV SOLN
3.0000 ug/kg/min | INTRAVENOUS | Status: DC
Start: 2023-08-24 — End: 2023-08-28

## 2023-08-24 MED ORDER — FENTANYL CITRATE (PF) 100 MCG/2ML IJ SOLN
25.0000 ug | INTRAMUSCULAR | Status: DC | PRN
  Administered 2023-08-24 (×3): 25 ug via INTRAVENOUS

## 2023-08-24 MED ORDER — HEMOSTATIC AGENTS (NO CHARGE) OPTIME
TOPICAL | Status: DC | PRN
  Administered 2023-08-24: 1 via TOPICAL

## 2023-08-24 MED ORDER — ORAL CARE MOUTH RINSE: 15.0000 mL | OROMUCOSAL | Status: DC | PRN

## 2023-08-24 MED ORDER — DEXAMETHASONE SODIUM PHOSPHATE 10 MG/ML IJ SOLN
INTRAMUSCULAR | Status: AC
  Filled 2023-08-24: qty 1

## 2023-08-24 MED ORDER — ENOXAPARIN SODIUM 40 MG/0.4ML IJ SOSY
40.0000 mg | PREFILLED_SYRINGE | INTRAMUSCULAR | Status: DC
  Administered 2023-08-25 – 2023-09-03 (×10): 40 mg via SUBCUTANEOUS
  Filled 2023-08-24 (×10): qty 0.4

## 2023-08-24 MED ORDER — ONDANSETRON HCL 4 MG/2ML IJ SOLN
INTRAMUSCULAR | Status: AC
  Filled 2023-08-24: qty 2

## 2023-08-24 MED ORDER — GUAIFENESIN-DM 100-10 MG/5ML PO SYRP: 15.0000 mL | ORAL_SOLUTION | ORAL | Status: DC | PRN

## 2023-08-24 MED ORDER — SODIUM CHLORIDE 0.9 % IV SOLN
500.0000 mL | Freq: Once | INTRAVENOUS | Status: AC | PRN
  Administered 2023-08-24: 1000 mL via INTRAVENOUS

## 2023-08-24 MED ORDER — PROSIGHT PO TABS
1.0000 | ORAL_TABLET | Freq: Every day | ORAL | Status: DC
  Administered 2023-08-25: 1 via ORAL
  Filled 2023-08-24 (×2): qty 1

## 2023-08-24 MED ORDER — DOCUSATE SODIUM 100 MG PO CAPS
100.0000 mg | ORAL_CAPSULE | Freq: Every day | ORAL | Status: DC
  Administered 2023-08-25 – 2023-09-03 (×9): 100 mg via ORAL
  Filled 2023-08-24 (×10): qty 1

## 2023-08-24 MED ORDER — LACTATED RINGERS IV SOLN: INTRAVENOUS | Status: DC

## 2023-08-24 MED ORDER — DIAZEPAM 5 MG PO TABS
5.0000 mg | ORAL_TABLET | Freq: Two times a day (BID) | ORAL | Status: DC | PRN
  Administered 2023-08-25 (×2): 5 mg via ORAL
  Filled 2023-08-24 (×2): qty 1

## 2023-08-24 MED ORDER — ROSUVASTATIN CALCIUM 10 MG PO TABS
20.0000 mg | ORAL_TABLET | Freq: Every day | ORAL | Status: DC
  Administered 2023-08-25 – 2023-09-03 (×10): 20 mg via ORAL
  Filled 2023-08-24 (×10): qty 2

## 2023-08-24 MED ORDER — HEPARIN 30,000 UNITS/1000 ML (OHS) CELLSAVER SOLUTION
Status: AC
  Filled 2023-08-24: qty 1000

## 2023-08-24 MED ORDER — CHLORHEXIDINE GLUCONATE 0.12 % MT SOLN
OROMUCOSAL | Status: AC
  Filled 2023-08-24: qty 15

## 2023-08-24 MED ORDER — PANTOPRAZOLE SODIUM 40 MG PO TBEC
40.0000 mg | DELAYED_RELEASE_TABLET | Freq: Every day | ORAL | Status: DC
  Administered 2023-08-25 – 2023-09-03 (×9): 40 mg via ORAL
  Filled 2023-08-24 (×10): qty 1

## 2023-08-24 MED ORDER — PHENOL 1.4 % MT LIQD: 1.0000 | OROMUCOSAL | Status: DC | PRN

## 2023-08-24 MED ORDER — SENNOSIDES-DOCUSATE SODIUM 8.6-50 MG PO TABS: 1.0000 | ORAL_TABLET | Freq: Every evening | ORAL | Status: DC | PRN

## 2023-08-24 MED ORDER — LOPERAMIDE HCL 2 MG PO CAPS
2.0000 mg | ORAL_CAPSULE | Freq: Every day | ORAL | Status: DC
  Administered 2023-08-27 – 2023-09-02 (×6): 2 mg via ORAL
  Filled 2023-08-24 (×8): qty 1

## 2023-08-24 MED ORDER — VITAMIN C 500 MG PO TABS
1000.0000 mg | ORAL_TABLET | Freq: Every day | ORAL | Status: DC
Start: 2023-08-25 — End: 2023-09-03
  Administered 2023-08-25 – 2023-09-03 (×10): 1000 mg via ORAL
  Filled 2023-08-24 (×10): qty 2

## 2023-08-24 MED ORDER — SUGAMMADEX SODIUM 200 MG/2ML IV SOLN
INTRAVENOUS | Status: DC | PRN
  Administered 2023-08-24 (×3): 50 mg via INTRAVENOUS

## 2023-08-24 MED ORDER — SODIUM CHLORIDE 0.9 % IV BOLUS
500.0000 mL | Freq: Once | INTRAVENOUS | Status: AC
  Administered 2023-08-24: 500 mL via INTRAVENOUS

## 2023-08-24 MED ORDER — NITROGLYCERIN IN D5W 200-5 MCG/ML-% IV SOLN
5.0000 ug/min | INTRAVENOUS | Status: DC
Start: 2023-08-24 — End: 2023-08-28

## 2023-08-24 MED ORDER — NOREPINEPHRINE BITARTRATE 1 MG/ML IV SOLN: INTRAVENOUS | Status: DC | PRN

## 2023-08-24 MED ORDER — VITAMIN D 25 MCG (1000 UNIT) PO TABS
5000.0000 [IU] | ORAL_TABLET | Freq: Every day | ORAL | Status: DC
  Administered 2023-08-25 – 2023-09-03 (×10): 5000 [IU] via ORAL
  Filled 2023-08-24 (×10): qty 5

## 2023-08-24 MED ORDER — LABETALOL HCL 5 MG/ML IV SOLN: 10.0000 mg | INTRAVENOUS | Status: DC | PRN

## 2023-08-24 MED ORDER — ROCURONIUM BROMIDE 10 MG/ML (PF) SYRINGE
PREFILLED_SYRINGE | INTRAVENOUS | Status: AC
  Filled 2023-08-24: qty 10

## 2023-08-24 MED ORDER — NOREPINEPHRINE 4 MG/250ML-% IV SOLN
INTRAVENOUS | Status: AC
  Filled 2023-08-24: qty 250

## 2023-08-24 MED ORDER — VANCOMYCIN HCL 1000 MG IV SOLR
INTRAVENOUS | Status: DC | PRN
  Administered 2023-08-24: 1000 mg

## 2023-08-24 MED ORDER — VISTASEAL 10 ML SINGLE DOSE KIT
PACK | CUTANEOUS | Status: AC
  Filled 2023-08-24: qty 10

## 2023-08-24 MED ORDER — NOREPINEPHRINE 4 MG/250ML-% IV SOLN
INTRAVENOUS | Status: DC | PRN
  Administered 2023-08-24: 4 ug/min via INTRAVENOUS

## 2023-08-24 MED ORDER — HEPARIN SODIUM (PORCINE) 5000 UNIT/ML IJ SOLN
INTRAMUSCULAR | Status: AC
  Filled 2023-08-24: qty 1

## 2023-08-24 MED ORDER — GENTAMICIN SULFATE 40 MG/ML IJ SOLN
INTRAMUSCULAR | Status: AC
  Filled 2023-08-24: qty 2

## 2023-08-24 MED ORDER — CEFAZOLIN SODIUM-DEXTROSE 2-4 GM/100ML-% IV SOLN
INTRAVENOUS | Status: AC
  Filled 2023-08-24: qty 100

## 2023-08-24 MED ORDER — CLOPIDOGREL BISULFATE 75 MG PO TABS
75.0000 mg | ORAL_TABLET | Freq: Every day | ORAL | Status: DC
  Administered 2023-08-25 – 2023-08-31 (×7): 75 mg via ORAL
  Filled 2023-08-24 (×9): qty 1

## 2023-08-24 SURGICAL SUPPLY — 82 items
APPLIER CLIP 11 MED OPEN (CLIP)
APPLIER CLIP 9.375 SM OPEN (CLIP)
BAG DECANTER FOR FLEXI CONT (MISCELLANEOUS) ×3 IMPLANT
BAG ISOLATATION DRAPE 20X20 ST (DRAPES) IMPLANT
BASIN GRAD PLASTIC 32OZ STRL (MISCELLANEOUS) IMPLANT
BLADE SURG 15 STRL LF DISP TIS (BLADE) ×3 IMPLANT
BLADE SURG SZ11 CARB STEEL (BLADE) ×3 IMPLANT
BRUSH SCRUB EZ 4% CHG (MISCELLANEOUS) ×3 IMPLANT
CATH BEACON 5 .035 40 KMP TP (CATHETERS) IMPLANT
CATH EMB LF 3FRX80 (CATHETERS) IMPLANT
CATH SHOCKWAVE M5 6.5X60 (CATHETERS) IMPLANT
CATH SHOCKWAVE M5 8.0X60 (CATHETERS) IMPLANT
CHLORAPREP W/TINT 26 (MISCELLANEOUS) ×3 IMPLANT
CLAMP SUTURE YELLOW 5 PAIRS (MISCELLANEOUS) ×3 IMPLANT
CLIP APPLIE 11 MED OPEN (CLIP) IMPLANT
CLIP APPLIE 9.375 SM OPEN (CLIP) IMPLANT
DEVICE PRESTO INFLATION (MISCELLANEOUS) IMPLANT
DRAPE INCISE IOBAN 66X45 STRL (DRAPES) ×3 IMPLANT
DRESSING SURGICEL FIBRLLR 1X2 (HEMOSTASIS) ×3 IMPLANT
DRSG OPSITE POSTOP 4X6 (GAUZE/BANDAGES/DRESSINGS) IMPLANT
DRSG SURGICEL FIBRILLAR 1X2 (HEMOSTASIS) ×3
ELECT CAUTERY BLADE 6.4 (BLADE) ×3 IMPLANT
ELECT REM PT RETURN 9FT ADLT (ELECTROSURGICAL) ×3
ELECTRODE REM PT RTRN 9FT ADLT (ELECTROSURGICAL) ×3 IMPLANT
ENSNARE 18-30 (MISCELLANEOUS) IMPLANT
GAUZE 4X4 16PLY ~~LOC~~+RFID DBL (SPONGE) ×3 IMPLANT
GLIDEWIRE ADV .035X180CM (WIRE) IMPLANT
GLIDEWIRE ADV .035X260CM (WIRE) IMPLANT
GLOVE BIO SURGEON STRL SZ7 (GLOVE) ×9 IMPLANT
GOWN STRL REUS W/ TWL LRG LVL3 (GOWN DISPOSABLE) ×6 IMPLANT
GOWN STRL REUS W/ TWL XL LVL3 (GOWN DISPOSABLE) ×3 IMPLANT
GOWN STRL REUS W/TWL 2XL LVL3 (GOWN DISPOSABLE) ×3 IMPLANT
GRAFT VASC PATCH XENOSURE 1X14 (Vascular Products) IMPLANT
GUIDEWIRE SUPER STIFF .035X180 (WIRE) IMPLANT
HEMOSTAT HEMOBLAST BELLOWS (HEMOSTASIS) IMPLANT
INTRODUCER 7FR 23CM (INTRODUCER) IMPLANT
IV NS 500ML BAXH (IV SOLUTION) ×3 IMPLANT
KIT PREVENA INCISION MGT 13 (CANNISTER) IMPLANT
KIT STIMULAN RAPID CURE 5CC (Orthopedic Implant) IMPLANT
KIT TURNOVER KIT A (KITS) ×3 IMPLANT
LABEL OR SOLS (LABEL) ×3 IMPLANT
LOOP VESSEL MAXI 1X406 RED (MISCELLANEOUS) ×6 IMPLANT
LOOP VESSEL MINI 0.8X406 BLUE (MISCELLANEOUS) ×6 IMPLANT
MANIFOLD NEPTUNE II (INSTRUMENTS) ×3 IMPLANT
NDL SAFETY ECLIPSE 18X1.5 (NEEDLE) ×3 IMPLANT
NS IRRIG 500ML POUR BTL (IV SOLUTION) ×3 IMPLANT
PACK ANGIOGRAPHY (CUSTOM PROCEDURE TRAY) IMPLANT
PACK BASIN MAJOR ARMC (MISCELLANEOUS) ×3 IMPLANT
PACK UNIVERSAL (MISCELLANEOUS) ×3 IMPLANT
PENCIL SMOKE EVACUATOR (MISCELLANEOUS) IMPLANT
RETRACTOR TRAXI PANNICULUS (MISCELLANEOUS) IMPLANT
SET WALTER ACTIVATION W/DRAPE (SET/KITS/TRAYS/PACK) ×3 IMPLANT
SHEATH AFX SYS S1745 (SHEATH) IMPLANT
SHEATH BRITE TIP 8FRX11 (SHEATH) IMPLANT
SPONGE T-LAP 18X18 ~~LOC~~+RFID (SPONGE) ×6 IMPLANT
STAPLER SKIN PROX 35W (STAPLE) ×3 IMPLANT
STENT GRAFT AFX 22X60/13X14 (Endovascular Graft) IMPLANT
STENT LIFESTREAM 10X38X80 (Permanent Stent) IMPLANT
STENT LIFESTREAM 10X58X80 (Permanent Stent) IMPLANT
STENT LIFESTREAM 9X58X80 (Permanent Stent) IMPLANT
SUT ETHILON 3-0 FS-10 30 BLK (SUTURE) ×3
SUT PROLENE 5 0 RB 1 DA (SUTURE) ×6 IMPLANT
SUT PROLENE 6 0 BV (SUTURE) ×12 IMPLANT
SUT PROLENE 7 0 BV 1 (SUTURE) ×6 IMPLANT
SUT SILK 2-0 18XBRD TIE 12 (SUTURE) ×3 IMPLANT
SUT SILK 3-0 18XBRD TIE 12 (SUTURE) ×3 IMPLANT
SUT SILK 4-0 18XBRD TIE 12 (SUTURE) ×3 IMPLANT
SUT VIC AB 2-0 CT1 TAPERPNT 27 (SUTURE) ×6 IMPLANT
SUT VIC AB 3-0 SH 27X BRD (SUTURE) ×3 IMPLANT
SUT VICRYL+ 3-0 36IN CT-1 (SUTURE) ×6 IMPLANT
SUTURE EHLN 3-0 FS-10 30 BLK (SUTURE) ×3 IMPLANT
SYR 20ML LL LF (SYRINGE) ×3 IMPLANT
SYR 5ML LL (SYRINGE) ×3 IMPLANT
TAG SUTURE CLAMP YLW 5PR (MISCELLANEOUS) ×3
TAPE TRANSPORE STRL 2 31045 (GAUZE/BANDAGES/DRESSINGS) IMPLANT
TRAP FLUID SMOKE EVACUATOR (MISCELLANEOUS) ×3 IMPLANT
TRAY FOLEY MTR SLVR 16FR STAT (SET/KITS/TRAYS/PACK) ×3 IMPLANT
TUBING CONTRAST HIGH PRESS 72 (TUBING) IMPLANT
WATER STERILE IRR 500ML POUR (IV SOLUTION) ×3 IMPLANT
WIRE G LUND 35X260X7 (WIRE) IMPLANT
WIRE GUIDERIGHT .035X150 (WIRE) IMPLANT
WIRE RUNTHROUGH .014X300CM (WIRE) IMPLANT

## 2023-08-24 NOTE — Plan of Care (Signed)
  Problem: Education: Goal: Knowledge of General Education information will improve Description: Including pain rating scale, medication(s)/side effects and non-pharmacologic comfort measures Outcome: Progressing   Problem: Health Behavior/Discharge Planning: Goal: Ability to manage health-related needs will improve Outcome: Progressing   Problem: Clinical Measurements: Goal: Ability to maintain clinical measurements within normal limits will improve Outcome: Progressing Goal: Will remain free from infection Outcome: Progressing Goal: Respiratory complications will improve Outcome: Progressing Goal: Cardiovascular complication will be avoided Outcome: Progressing   Problem: Activity: Goal: Risk for activity intolerance will decrease Outcome: Progressing   Problem: Nutrition: Goal: Adequate nutrition will be maintained Outcome: Progressing   Problem: Coping: Goal: Level of anxiety will decrease Outcome: Progressing   Problem: Elimination: Goal: Will not experience complications related to bowel motility Outcome: Progressing Goal: Will not experience complications related to urinary retention Outcome: Progressing   Problem: Pain Managment: Goal: General experience of comfort will improve and/or be controlled Outcome: Progressing   Problem: Safety: Goal: Ability to remain free from injury will improve Outcome: Progressing   Problem: Skin Integrity: Goal: Risk for impaired skin integrity will decrease Outcome: Progressing   Problem: Education: Goal: Knowledge of prescribed regimen will improve Outcome: Progressing   Problem: Activity: Goal: Ability to tolerate increased activity will improve Outcome: Progressing   Problem: Bowel/Gastric: Goal: Gastrointestinal status for postoperative course will improve Outcome: Progressing   Problem: Clinical Measurements: Goal: Postoperative complications will be avoided or minimized Outcome: Progressing   Problem: Skin  Integrity: Goal: Demonstration of wound healing without infection will improve Outcome: Progressing

## 2023-08-24 NOTE — Transfer of Care (Signed)
 Immediate Anesthesia Transfer of Care Note  Patient: Yolanda Jimenez  Procedure(s) Performed: ENDARTERECTOMY FEMORAL (Bilateral: Leg Upper) APPLICATION OF CELL SAVER  Patient Location: PACU  Anesthesia Type:General  Level of Consciousness: awake and alert   Airway & Oxygen Therapy: Patient Spontanous Breathing and Patient connected to face mask oxygen  Post-op Assessment: Report given to RN and Post -op Vital signs reviewed and stable  Post vital signs: Reviewed and stable  Last Vitals:  Vitals Value Taken Time  BP 100/50 08/24/23 1304  Temp    Pulse 51 08/24/23 1312  Resp 17 08/24/23 1312  SpO2 98 % 08/24/23 1312  Vitals shown include unfiled device data.  Last Pain:  Vitals:   08/24/23 0613  TempSrc: Tympanic  PainSc: 0-No pain         Complications: There were no known notable events for this encounter.

## 2023-08-24 NOTE — Op Note (Signed)
 OPERATIVE NOTE   PROCEDURE: 1.   Left common femoral, profunda femoris, and superficial femoral artery endarterectomies 2.   Right common femoral, profunda femoris, and superficial femoral artery endarterectomies 3.   Aortogram and iliofemoral angiograms 4.  Use of the shockwave device for angioplasty and lithotripsy of the left common iliac artery with a 6 mm diameter by 6 cm length device 5.   Use of the shockwave device for angioplasty and lithotripsy of the right common iliac artery using an 8 mm diameter by 6 cm length device 6.   Placement of an Endologix aortic stent graft with 20 mm proximal and 13 mm distal stents for treatment of occlusive disease. 7.   Lifestream balloon expandable stent placement into the right common iliac artery with 10 mm diameter by 38 mm length Lifestream stent 8.   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  significant plaque in bilateral common femoral, profunda femoris, and superficial femoral arteries 2.  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  INDICATIONS:    Patient presents with disabling claudication symptoms worse on the left leg and the right with severe aortoiliac disease and severe bilateral common femoral disease.  Bilateral femoral endarterectomies and endoluminal therapy for the aorta and iliac arteries are planned to try to improve  perfusion.  The risks and benefits as well as alternative therapies including intervention were reviewed in detail all questions were answered the patient agrees to proceed with surgery.  DESCRIPTION: After obtaining full informed written consent, the patient was brought back to the operating room and placed supine upon the operating table.  The patient received IV antibiotics prior to induction.  After obtaining adequate anesthesia, the patient was prepped and draped in the standard fashion appropriate time out is called.    With myself working on the right and Dr. Prescilla Brod working on the left we began by dissecting out the femoral arteries on each side. Vertical incisions were created overlying both femoral arteries. The common femoral artery proximally, and superficial femoral artery, and primary profunda femoris artery branches were encircled with vessel loops and prepared for control. Both femoral arteries were found to have significant plaque from the common femoral artery into the profunda and superficial femoral arteries.   5000 units of heparin  was given and allowed circulate for 5 minutes.   Attention is then turned to the left femoral artery.  An arteriotomy is made with 11 blade and extended with Potts scissors in the common femoral artery and carried down onto the first 1-2 cm of the superficial femoral artery. An endarterectomy was then performed. The [facility] was used to create a plane. The proximal endpoint was cut flush with tenotomy scissors. This was in the proximal common femoral artery.  Hemostats were used to remove some additional plaque and the edges were tacked down with a pair of 7-0 Prolene sutures.  An eversion endarterectomy was then performed for the first 2-3 cm of the profunda  femoris artery. Good backbleeding was then seen.  A 7-0 Prolene tacking suture was used on plaque in the profunda femoris artery.  The distal endpoint of the superficial femoral artery  endarterectomy was created with gentle traction and the distal endpoint was clean and feathered few centimeters into the superficial femoral artery.  The bovine pericardial patch is then selected and prepared for a patch angioplasty.  It is cut and beveled and started at the proximal endpoint with a 6-0 Prolene suture.  Approximately one half of the suture line is run medially and laterally and the distal end point was cut and bevelled to match the arteriotomy.  A second 6-0 Prolene was started at the distal end point and run to the mid portion to complete the arteriotomy, leaving a gap for sheath placement for the endovascular portion of the procedure.   The right femoral artery is then addressed. Arteriotomy is made in the common femoral artery and extended down into the first centimeter or 2 of the right superficial femoral artery. Similarly, an endarterectomy was performed with the [facility]. The proximal endpoint was cut flush with tenotomy scissors in the proximal common femoral artery.  The origin of the profunda femoris artery was addressed and treated with an eversion endarterectomy and this was performed with a hemostat and gentle traction. The arteriotomy was carried down onto the superficial femoral artery and the endarterectomy was continued to this point. The distal endpoint was nice and feathered with gentle traction. The bovine pericardial patch was then brought onto the field.  It is cut and beveled and started at the distal endpoint with a 6-0 Prolene suture.  Approximately one half of the suture line is run medially and laterally and the distal end point was cut and bevelled to match the arteriotomy.  The superior portion of the suture line was not closed at this time due to the large sheath would be placed up the right side for the aortic stent graft.  At this point, 8 [place] sheaths were placed on each side and retrograde images were performed showing the occlusion of the left common  iliac artery and the high-grade stenosis of the right common iliac artery in the 80% range.  I then passed an advantage wire upon the right into the aorta and placed a pigtail catheter when aortogram was performed.  The renal arteries appeared fairly normal.  The distal aorta was heavily calcified and diseased and the common iliac arteries were heavily diseased as above.  Dr. Prescilla Brod was unable to cross the occlusion in the left common iliac artery without difficulty with a Kumpe catheter and advantage wire and confirm intraluminal flow in the aorta.  We then began with the shockwave angioplasty with lithotripsy to both common iliac arteries and distal aorta due to the heavy calcific disease.  We exchanged for 0.014 wires.  On the left side, we used a 6 mm diameter by 6 cm length shockwave device.  This was inflated in the common iliac artery up to the distal aorta and taken up to 4 atm.  We then delivered a total of 6 pulses of lithotripsy to the left common iliac artery up to the distal aorta.  Imaging following this showed a much better flow channel although significant disease was still present particular in the mid to distal left common iliac artery.  On the right side, an 8 mm diameter by 6 cm length shockwave device was then placed in the right common iliac artery up to  the distal aorta.  We then delivered a total of 6 pulses of lithotripsy on the right common iliac artery.  Completion imaging following this showed significant improvement although still an area of 50 to 60% stenosis was present in the mid right common iliac artery. We then exchanged for a Lunderquist wire on the right side and placed the delivery sheath for the Endologix AFX device over the Lunderquist wire and up to the distal aorta.  Dr. Prescilla Brod placed a snare the left side and was able to snare the 0.014 wire that was placed in a buddy wire fashion through the right femoral sheath.  He pulled this out the left femoral sheath.  The  Endologix aortic stent graft device was then brought up through the right femoral sheath into the aorta above the level of disease.  Twisting was performed to remove any wire wrap.  We then parked the device on the aortic bifurcation.  It was then deployed in the aorta and going into both common iliac arteries.  There was still significant residual constraint which was not surprising in the iliac arteries and to treat the iliac disease that was pretreated with the shockwave device, we selected a 9 mm diameter by 58 mm length Lifestream stent on the left and a 10 mm diameter by 38 mm length Lifestream stent on the right.  These were deployed in a kissing balloon expandable fashion in both common iliac arteries.  The balloons were then taken up into the aortic portion of the stent graft to post dilate the Endologix aortic stent graft.  Completion imaging after replacing pigtail catheter showed good flow in both renal arteries.  The Endologix aortic stent graft was widely patent and the Lifestream stents and iliac arteries were widely patent and there was less than 10% residual stenosis in the aorta and iliac arteries.  Both hypogastric arteries had good flow in both external iliac arteries remain patent.  We then removed the sheaths and turned our attention to arterial closure.  Attention was turned to the closure of the left femoral artery.  The sheath was removed.  The vessel was flushed prior to release of control and completion of the anastomosis.  At this point, flow was established first to the profunda femoris artery and then to the superficial femoral artery. Easily palpable pulses are noted well beyond the anastomosis in both arteries.  Attention was then turned to the closure of the right femoral artery.  The sheath was removed. A second 6-0 Prolene was started at the distal end point and run to the mid portion to complete the arteriotomy.   Flushing maneuvers were performed and flow was reestablished to  the femoral vessels. Excellent pulses noted in the right superficial femoral and profunda femoris artery below the femoral anastomosis.  Fibrillar and Hemoblast topical hemostatic agents were placed in the femoral incisions and hemostasis was complete.  Gentamicin  and vancomycin  impregnated stimulan beads were then placed into the wound.  The femoral incisions were then closed in a layered fashion with 2 layers of 2-0 Vicryl, 2 layers of 3-0 Vicryl, and staples for the skin closure.  Incisional (disposable) VAC dressings were then placed over both groin wounds.  The suction sponge was placed over the wound and strips of adhesive were placed to create a good occlusive seal when the suction tubing was connected.  The patient was then awakened from anesthesia and taken to the recovery room in stable condition having tolerated the procedure well.  COMPLICATIONS: None  CONDITION: Stable     Mikki Alexander 08/24/2023 1:07 PM  This note was created with Dragon Medical transcription system. Any errors in dictation are purely unintentional.

## 2023-08-24 NOTE — Anesthesia Postprocedure Evaluation (Addendum)
 Anesthesia Post Note  Patient: Yolanda Jimenez  Procedure(s) Performed: ENDARTERECTOMY FEMORAL (Bilateral: Leg Upper) APPLICATION OF CELL SAVER  Patient location during evaluation: PACU Anesthesia Type: General Level of consciousness: awake and alert Pain management: pain level controlled Vital Signs Assessment: post-procedure vital signs reviewed and stable Respiratory status: spontaneous breathing, nonlabored ventilation, respiratory function stable and patient connected to nasal cannula oxygen Cardiovascular status: blood pressure returned to baseline and stable Postop Assessment: no apparent nausea or vomiting Anesthetic complications: no Comments: The R brachial arterial line had dislodged created a hematoma. Her R radial pulse was strong.    There were no known notable events for this encounter.   Last Vitals:  Vitals:   08/24/23 1305 08/24/23 1400  BP: (!) 100/50   Pulse: 60   Resp: 14 16  Temp: 36.8 C   SpO2: 98%     Last Pain:  Vitals:   08/24/23 1345  TempSrc:   PainSc: 7                  Baltazar Bonier

## 2023-08-24 NOTE — Op Note (Signed)
 OPERATIVE NOTE   PROCEDURE: Bilateral common femoral, superficial femoral and profunda femoris endarterectomy with bovine pericardial patch angioplasty. Lithoangioplasty of the left common iliac artery to 6 mm with a 6 mm x 6 cm Shockwave catheter. Lithoangioplasty of the right common iliac artery to 8 mm with a 8 mm x 6 cm Shockwave catheter. Angioplasty and stent placement of the abdominal aorta using an Endologix 20 mm x 13 mm stent graft for treatment of occlusive disease. Angioplasty and stent placement of the right common iliac artery using a 10 mm x 38 mm Lifestream stent. Angioplasty and stent placement of the left common iliac artery using a 9 mm x 58 mm Lifestream stent  PRE-OPERATIVE DIAGNOSIS: Atherosclerotic occlusive disease bilateral lower extremities with lifestyle limiting claudication and rest pain symptoms.  POST-OPERATIVE DIAGNOSIS: Same  CO-SURGEON: Jackquelyn Mass, MD and [employer], M.D.  ASSISTANT(S): None  ANESTHESIA: general  ESTIMATED BLOOD LOSS: 300 cc  FINDING(S): Profound calcific plaque noted bilaterally extending past the initial bifurcation of the profunda femoris arteries as well as down the extensive length of the SFA  SPECIMEN(S):  Calcific plaque from the common femoral, superficial femoral and the profunda femoris arteries bilaterally  INDICATIONS:   Yolanda Jimenez 78 y.o. y.o.female who presents with complaints of lifestyle limiting claudication and pain continuously in the feet bilaterally. The patient has documented severe atherosclerotic occlusive disease and has undergone multiple minimally invasive treatments in the past. However, at this point his primary area of stricture stenosis resides in the common femoral and origins of the superficial femoral and profunda femoris extending into these arteries and therefore this is not amenable to intervention and he is now undergoing open endarterectomy. The risks and benefits of been  reviewed with the patient, all questions have answered; alternative therapies have been reviewed as well and the patient has agreed to proceed with surgical open repair.  DESCRIPTION: After obtaining full informed written consent, the patient was brought back to the operating room and placed supine upon the operating table.  The patient received IV antibiotics prior to induction.  After obtaining adequate anesthesia, the patient was prepped and draped in the standard fashion for: bilateral femoral exposure.    Co-surgeons are required because this is a very complex bilateral procedure with work being performed simultaneously from both the right femoral and left femoral approach.  This also expedites the procedure making a shorter operative time reducing complications and improving patient safety.  Attention was turned to the bilateral groin with Dr. Vonna Guardian working on the patient's right and myself working on the left of the patient.  Vertical  incisions were made over the common femoral artery and dissected down to the common femoral artery with electrocautery.  I dissected out the common femoral artery from the distal external iliac artery (identified by the superficial circumflex vessels) down to the femoral bifurcation.  On initial inspection, the common femoral artery was: Densely calcified and there was no palpable pulse noted bilaterally.    Subsequently the dissection was continued to include all circumflex branches and the profunda femoral artery and superficial femoral artery. The superficial femoral artery was dissected circumferentially for a distance of approximately 3-4 cm and the profunda femoris was dissected circumferentially out to the fourth order branches individual vessel loops were placed around each branch. Both of the groins were treated simultaneously as described above. Control of all branches was obtained with vessel loops.  A softer area in the distal external iliac artery  amendable to  clamping was identified.    The patient was given 5000 units of Heparin  intravenously, which was a therapeutic bolus.   After waiting 3 minutes, the right distal external iliac artery was clamped and we placed all the Silastic Vesseloops on the circumflex branches, the profunda and superficial femoral arteries under tension.  Arteriotomy was made in the right common femoral artery with a 11-blade and extended it with a Potts scissor proximally and distally extending the distal end down the SFA for approximately 3 cm.   Endarterectomy was then performed under direct visualization using a freer elevator and a right angle from the mid common femoral extending up both proximally and distally. Proximally the endarterectomy was brought up to the level of the clamp where a clean edge was obtained. Distally the endarterectomy was carried down to a soft spot in the SFA where a feathered edge would was obtained.  7-0 Prolene interrupted tacking sutures were placed to secure the leading edge of the plaque in the SFA.  The right profunda femoris was treated with an eversion technique extending the endarterectomy approximately 2 cm distally again obtaining a featheredge.   At this point, we fashioned a bovine pericardial patch for the geometry of the arteriotomy.  The patch was sewn to the artery with 2 running stitches of 6-0 Prolene, running from the distal and both laterally and medially.  We did not complete the suture line proximally given the size of the sheath that is required to deliver the aortic stent.  At this point attention was turned to the opposite groin.  The left distal external iliac artery was clamped and we placed all the Silastic Vesseloops on the circumflex branches, the profunda and superficial femoral arteries under tension.  Arteriotomy was made in the left common femoral artery with a 11-blade and extended it with a Potts scissor proximally and distally extending the distal end down the SFA  for approximately 3 cm.   Endarterectomy was then performed under direct visualization using a freer elevator and a right angle from the mid common femoral extending up both proximally and distally. Proximally the endarterectomy was brought up to the level of the clamp where a clean edge was obtained. Distally the endarterectomy was carried down to a soft spot in the SFA where a feathered edge would was obtained.  7-0 Prolene interrupted tacking sutures were placed to secure the leading edge of the plaque in the SFA.  The left profunda femoris was treated with an eversion technique extending the endarterectomy approximately 2 cm distally again obtaining a featheredge.   At this point, we fashioned a bovine pericardial patch for the geometry of the arteriotomy.  The patch was sewn to the artery with 2 running stitches of 6-0 Prolene, running from each end.  On this side a gap was left in the lateral wall to allow for placement of the sheath for the interventional portion of the case.  We then began the interventional portion of the case.  With Dr. Vonna Guardian working on the right side and myself working on the left side and 8 French sheath was placed on the right and a 7 [place] sheath was placed on the left.  I was then able to negotiate an advantage wire with a Kumpe catheter through the left common iliac artery occlusion and hand-injection of contrast verified intraluminal positioning.  Dr. Vonna Guardian was then able to negotiate a wire and Kumpe catheter through the occlusive disease on the right and the Kumpe  catheter was exchanged for the Pigtail catheter which was placed into the aorta from the right side.  Injection of contrast was performed and using this image, we selected a 6 mm x 60 mm shockwave lithoangioplasty catheter for the left side.  I then advanced this catheter over a 0.014 wire across the common iliac artery occlusion.  The shockwave lithoangioplasty catheter was then inflated to 4 atm and 6 treatments  were performed proximally each of 10 seconds duration after the third treatment the balloon was deflated and repositioned slightly.  A second 3 treatments was then performed at this location.  The balloon was then deflated and repositioned more distally across the iliac bifurcation and 2 more treatments of 10 seconds duration were performed with the balloon inflated to 4 atm.  Follow-up imaging demonstrated a marked improvement in the lumen but there still was greater than 60% residual stenosis.  In similar fashion on the right side and 8 mm x 6 cm shockwave catheter was utilized.  Dr. Vonna Guardian and advanced across the proximal common iliac on the right a total of 6 treatments was performed with 10-second duration deflating the balloon which was inflated to 4 atm initially after the third treatment and then reinflating it again to 4 atm.  After treating the right common iliac with the shockwave lithoangioplasty catheter there was significant improvement with remodeling of the calcium  as anticipated but there remained greater than 60% residual stenosis additionally the distal aorta demonstrated profound calcific disease with a hemodynamically significant stenosis of greater than 70%.  At this point we were required to move forward with stenting of the aorta and the bilateral common iliac arteries.  Based on the images obtained a 20 mm x 13 mm Endologix AFX device device.  A Lunderquist wire was then advanced up the right side.  The delivery sheath was advanced over a stiff wire and position with the tip of the sheath just above the aortic bifurcation.   The 23 mm x 13 mm Endologix AFX main body was then placed into the delivery sheath and the contralateral snare wire advanced through the delivery sheath and positioned at the tip of the sheath.  Working from the left side a trilobed snare was advanced through the 7 French sheath and positioned several centimeters above the tip of the delivery sheath.  The contralateral  snare wire was then advanced through the snare and the snare used to capture the contralateral wire.  The wire was then fed from the ipsilateral side while it was pulled out the 7 [place] sheath.  Taking care and making adjustments to ensure there was no wire wrap.  The Endologix main body was then advanced completely out of the delivery sheath again ensuring that there was no wire wrap.  Subsequently working from both the right and left sides the main body was pulled down to seated squarely on the aortic bifurcation.  The outer yellow catheter was then removed from the contralateral wire opening the contralateral limb of the main body.  Pigtail catheter was then advanced up the main wire into the proximal infrarenal aorta.  Snare wire was then removed.  The main body deployment was then completed.  A 035 wire was then advanced through the pigtail catheter on the left side.  Given the residual disease in the common iliac arteries a 9 mm x 58 mm Lifestream stent was selected for the left side and a 10 mm x 38 mm Lifestream stent was selected for the right side.  The stents were advanced simultaneously and deployed in the kissing balloon fashion treating both common iliac arteries up to the aortic bifurcation.  The balloons were then advanced into the aortic stent and inflated simultaneously to fully expand the Endologix AFX stent as well.  Pigtail catheter was then reintroduced and bolus imaging of the aorta iliac arteries was obtained which demonstrated patency of both renal arteries.  The aortic stent graft is widely patent with less than 10% residual stenosis as are both common iliac arteries.  Both hypogastric arteries remain widely patent and both external iliac arteries are patent without any hemodynamically significant stenosis.  The sheaths and wires were then removed and the iliac arteries flushed with heparinized saline.  Attention was then turned to completing the closure of the femoral endarterectomies.   The left patch angioplasty was completed in the usual fashion.  Flow was then reestablished first to the profunda femoris and then the superficial femoral artery. Any gaps or bleeding sites in the suture line were easily controlled with a 6-0 Prolene suture.  The right femoral endarterectomy patch was then addressed it was trimmed to the appropriate length approximately and then a 6-0 Prolene was utilized to complete the suture line slowing down the medial wall and then the lateral wall completing pericardial patch angioplasty.  Flow was then reestablished first to the profunda femoris and then the superficial femoral artery. Any gaps or bleeding sites in the suture line were easily controlled with a 6-0 Prolene suture.   Both right and left groins were then irrigated copiously with sterile saline and subsequently Vistaseal  and fibrillar were placed in the wound. The incision was repaired with a double layer of 2-0 Vicryl, a double layer of 3-0 Vicryl, and the skin was closed with staples.  Prevena disposable vacs were then applied to both groin incisions.  COMPLICATIONS: None  CONDITION: Yolanda Jimenez, M.D. [facility] Office: [phone]  08/24/2023, 1:03 PM

## 2023-08-24 NOTE — Anesthesia Preprocedure Evaluation (Signed)
 Anesthesia Evaluation  Patient identified by MRN, date of birth, ID band Patient awake    Reviewed: Allergy & Precautions, NPO status , Patient's Chart, lab work & pertinent test results  History of Anesthesia Complications (+) PONV and history of anesthetic complications  Airway Mallampati: II  TM Distance: >3 FB Neck ROM: full    Dental  (+) Chipped, Dental Advidsory Given   Pulmonary COPD,  COPD inhaler, Current Smoker and Patient abstained from smoking.   Pulmonary exam normal        Cardiovascular + CAD and + Peripheral Vascular Disease  + dysrhythmias Atrial Fibrillation + Valvular Problems/Murmurs AS      Neuro/Psych  PSYCHIATRIC DISORDERS Anxiety Depression    negative neurological ROS     GI/Hepatic Neg liver ROS,GERD  Medicated and Controlled,,  Endo/Other  negative endocrine ROSdiabetes    Renal/GU Renal disease     Musculoskeletal   Abdominal   Peds  Hematology  (+) Blood dyscrasia, anemia   Anesthesia Other Findings Past Medical History: No date: A-fib [facility])     Comment:  a.) CHA2DS2-VASc = 5 (age x2, sex, vascular disease               history, T2DM) as of 08/23/2023; b.) cardiac rate/rhythm               maintained on oral metoprolol  succinate; no chronic OAC 2015: Abnormal vaginal Pap smear No date: Anxiety     Comment:  a.) on BZO PRN (diazepam ) No date: Aortic atherosclerosis (HCC) 05/03/2023: Aortic stenosis     Comment:  a.) TTE 05/03/2023: mild AS (MPG 12 mmHg; AVA = 1.3 cm2) 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 difficile diarrhea No  date: CAD (coronary artery disease)     Comment:  a.) CT chest 07/12/2018: LAD calcifications; b.) CT CAP               11/18/2022: 3 vessel CAD; c.) cPET 05/19/2023: severe 4               vessel coronary calcs mainly in LAD distribution; d.)               cPET 05/03/2023: small/mild reversible defect in               mid-anterosep/ant region No date: Complication of anesthesia     Comment:  a.) delayed emergence; b.) emergence delirium; "I get               really loopy after surgery" No date: COPD (chronic obstructive pulmonary disease) (HCC) No date: DDD (degenerative disc disease), lumbosacral No date: Depression No date: Diverticulosis No date: Epigastric hernia No date: GERD (gastroesophageal reflux disease) No date: Glaucoma No date: Heart murmur No date: Heart palpitations 2015: History of bilateral cataract extraction No date: History of recurrent UTIs No date: Hx of adenomatous colonic polyps No date: Hyperlipidemia No date: IBS (irritable bowel syndrome) No date: Insomnia No date: Iron  deficiency anemia No date: Kidney cysts No date:  Long term current use of aspirin  No date: Macular degeneration of left eye No date: Osteoporosis     Comment:  a.) on RANKLi (denosumab ) No date: 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     Comment:  a.) noted on VCE in 2007 No date: Type 2 diabetes, diet controlled (HCC) No date: Wears dentures     Comment:  partial lower  Past Surgical History: No date: BUNIONECTOMY; Right No date: CATARACT EXTRACTION; Bilateral No date: CERVICAL CONE BIOPSY No date: CHOLECYSTECTOMY No date: COLONOSCOPY 01/22/2019: COLONOSCOPY WITH PROPOFOL ; N/A     Comment:  Procedure: COLONOSCOPY WITH BIOPSY;  Surgeon: Marnee Sink, MD;  Location: [facility];  Service:               Endoscopy;  Laterality: N/A;  diabetic - diet controlled 06/27/2020: COLONOSCOPY WITH  PROPOFOL ; N/A     Comment:  Procedure: COLONOSCOPY WITH BIOPSY;  Surgeon: Marnee Sink, MD;  Location: [facility] SUR[facility];  Service:               Endoscopy;  Laterality: N/A;  priority 4 08/29/2017: ESOPHAGOGASTRODUODENOSCOPY (EGD) WITH PROPOFOL ; N/A     Comment:  Procedure: ESOPHAGOGASTRODUODENOSCOPY (EGD) WITH               PROPOFOL ;  Surgeon: Marnee Sink, MD;  Location: [facility]               SURGERY CNTR;  Service: Endoscopy;  Laterality: N/A; No date: FOOT SURGERY     Comment:  right 2nd toe 05/16/2018: HIP ARTHROPLASTY; Left     Comment:  Procedure: ARTHROPLASTY BIPOLAR HIP (HEMIARTHROPLASTY);               Surgeon: Elner Hahn, MD;  Location: [facility] ORS;                Service: Orthopedics;  Laterality: Left; 04/14/2023: LOWER EXTREMITY ANGIOGRAPHY; Left     Comment:  Procedure: Lower Extremity Angiography;  Surgeon: [employer]so [employer], MD;  Location: [facility] INVASIVE CV LAB;  Service:               Cardiovascular;  Laterality: Left; 12/10/2021: ORIF PERIPROSTHETIC FRACTURE; Left     Comment:  Procedure: OPEN REDUCTION INTERNAL FIXATION OF A LEFT               GREATER TROCHANTERIC FRACTURE;  Surgeon: Elner Hahn,               MD;  Location: [facility] ORS;  Service: Orthopedics;                Laterality: Left; 10/07/2020: ORIF WRIST FRACTURE; Right     Comment:  Procedure: OPEN REDUCTION INTERNAL FIXATION (ORIF) WRIST              FRACTURE;  Surgeon: Molli Angelucci, MD;  Location: [facility]               ORS;  Service: Orthopedics;  Laterality: Right; 01/22/2019: POLYPECTOMY; N/A     Comment:  Procedure: POLYPECTOMY;  Surgeon: Marnee Sink, MD;                Location: [facility];  Service: Endoscopy;  Laterality: N/A;  2 Clips placed at polyp removal site in              ascending colon 06/27/2020: POLYPECTOMY; N/A     Comment:  Procedure: POLYPECTOMY;  Surgeon: Marnee Sink, MD;                Location: [facility] SUR[facility];  Service:  Endoscopy;                Laterality: N/A; No date: TUBAL LIGATION No date: UPPER GASTROINTESTINAL ENDOSCOPY  BMI    Body Mass Index: 20.15 kg/m      Reproductive/Obstetrics negative OB ROS                             Anesthesia Physical Anesthesia Plan  ASA: 3  Anesthesia Plan: General ETT and General   Post-op Pain Management:    Induction: Intravenous  PONV Risk Score and Plan: 3 and Ondansetron , Dexamethasone  and Midazolam   Airway Management Planned: Oral ETT  Additional Equipment: Arterial line  Intra-op Plan:   Post-operative Plan: Extubation in OR  Informed Consent: I have reviewed the patients History and Physical, chart, labs and discussed the procedure including the risks, benefits and alternatives for the proposed anesthesia with the patient or authorized representative who has indicated his/her understanding and acceptance.     Dental Advisory Given  Plan Discussed with: Anesthesiologist, CRNA and Surgeon  Anesthesia Plan Comments: (Patient consented for risks of anesthesia including but not limited to:  - adverse reactions to medications - damage to eyes, teeth, lips or other oral mucosa - nerve damage due to positioning  - sore throat or hoarseness - Damage to heart, brain, nerves, lungs, other parts of body or loss of life  Patient voiced understanding and assent.)       Anesthesia Quick Evaluation

## 2023-08-24 NOTE — Anesthesia Procedure Notes (Signed)
 Procedure Name: Intubation Date/Time: 08/24/2023 7:37 AM  Performed by: Wilkins Hardy I, CRNAPre-anesthesia Checklist: Patient identified, Emergency Drugs available and Suction available Patient Re-evaluated:Patient Re-evaluated prior to induction Oxygen Delivery Method: Circle system utilized Preoxygenation: Pre-oxygenation with 100% oxygen Induction Type: IV induction Ventilation: Mask ventilation without difficulty Laryngoscope Size: McGrath and 3 Grade View: Grade I Tube type: Oral Tube size: 6.5 mm Number of attempts: 1 Airway Equipment and Method: Stylet Placement Confirmation: ETT inserted through vocal cords under direct vision, positive ETCO2 and breath sounds checked- equal and bilateral Secured at: 18 cm Tube secured with: Tape Dental Injury: Teeth and Oropharynx as per pre-operative assessment

## 2023-08-24 NOTE — Anesthesia Procedure Notes (Signed)
 Arterial Line Insertion Start/End1/15/2025 7:45 AM, 08/24/2023 8:15 AM Performed by: Enrique Harvest, MD, anesthesiologist  Patient location: OR. Preanesthetic checklist: patient identified, IV checked, site marked, risks and benefits discussed, surgical consent, monitors and equipment checked, pre-op evaluation, timeout performed and anesthesia consent Lidocaine  1% used for infiltration Right, brachial was placed Catheter size: 20 G Hand hygiene performed  and maximum sterile barriers used   Attempts: 3 Procedure performed using ultrasound guided technique. Ultrasound Notes:anatomy identified, needle tip was noted to be adjacent to the nerve/plexus identified and no ultrasound evidence of intravascular and/or intraneural injection Following insertion, dressing applied and Biopatch. Post procedure assessment: normal and unchanged  Post procedure complications: hemorrhage, unsuccessful attempts, local hematoma and second provider assisted. Patient tolerated the procedure well with no immediate complications. Additional procedure comments: Multiple attempts made. Needle was intraarterial every attempt but unable to thread guide wire. Bleeding and hematoma seen. Direct pressure applied. Will continue to monitor for signs of ischemia.Yolanda Jimenez

## 2023-08-24 NOTE — Progress Notes (Signed)
 Penne Bowl, NP notified that pt had 7 beat run of vtach at 1603. No further orders at this time.

## 2023-08-24 NOTE — Interval H&P Note (Signed)
 History and Physical Interval Note:  08/24/2023 7:18 AM  Yolanda Jimenez  has presented today for surgery, with the diagnosis of ASO WITH CLAUDICATION.  The various methods of treatment have been discussed with the patient and family. After consideration of risks, benefits and other options for treatment, the patient has consented to  Procedure(s): ENDARTERECTOMY FEMORAL (Bilateral) APPLICATION OF CELL SAVER (N/A) as a surgical intervention.  The patient's history has been reviewed, patient examined, no change in status, stable for surgery.  I have reviewed the patient's chart and labs.  Questions were answered to the patient's satisfaction.     [name]

## 2023-08-24 NOTE — Final Progress Note (Signed)
 Upon arrival to ICU pedal pulses checked with accepting RN, doppler bil pedal area present.

## 2023-08-24 NOTE — Progress Notes (Signed)
 Patient awake/alert x4. Bilateral prevena groin area, intact no s/s hematoma noted. Functioning properly.  Medicated for discomfort as ordered. Family updated.  Patient received fluid bolus in pacu for sbp 90's. Arrived from OR with right brachial aline, not functioning. Noted swelling at site, removed without event, pressure held, no s/s hematoma  Anesthesia Dr. Lincoln Renshaw aware.

## 2023-08-25 ENCOUNTER — Other Ambulatory Visit: Payer: Self-pay

## 2023-08-25 ENCOUNTER — Inpatient Hospital Stay: Payer: PPO

## 2023-08-25 DIAGNOSIS — I491 Atrial premature depolarization: Secondary | ICD-10-CM | POA: Diagnosis not present

## 2023-08-25 DIAGNOSIS — I472 Ventricular tachycardia, unspecified: Secondary | ICD-10-CM | POA: Diagnosis not present

## 2023-08-25 LAB — CBC
HCT: 25.3 % — ABNORMAL LOW (ref 36.0–46.0)
Hemoglobin: 7.7 g/dL — ABNORMAL LOW (ref 12.0–15.0)
MCH: 30.7 pg (ref 26.0–34.0)
MCHC: 30.4 g/dL (ref 30.0–36.0)
MCV: 100.8 fL — ABNORMAL HIGH (ref 80.0–100.0)
Platelets: 248 10*3/uL (ref 150–400)
RBC: 2.51 MIL/uL — ABNORMAL LOW (ref 3.87–5.11)
RDW: 21.1 % — ABNORMAL HIGH (ref 11.5–15.5)
WBC: 11.5 10*3/uL — ABNORMAL HIGH (ref 4.0–10.5)
nRBC: 0 % (ref 0.0–0.2)

## 2023-08-25 LAB — URINALYSIS, ROUTINE W REFLEX MICROSCOPIC
Bacteria, UA: NONE SEEN
Bilirubin Urine: NEGATIVE
Glucose, UA: NEGATIVE mg/dL
Hgb urine dipstick: NEGATIVE
Ketones, ur: NEGATIVE mg/dL
Nitrite: NEGATIVE
Protein, ur: NEGATIVE mg/dL
Specific Gravity, Urine: 1.005 (ref 1.005–1.030)
pH: 5 (ref 5.0–8.0)

## 2023-08-25 LAB — BASIC METABOLIC PANEL
Anion gap: 8 (ref 5–15)
BUN: 11 mg/dL (ref 8–23)
CO2: 20 mmol/L — ABNORMAL LOW (ref 22–32)
Calcium: 7.4 mg/dL — ABNORMAL LOW (ref 8.9–10.3)
Chloride: 105 mmol/L (ref 98–111)
Creatinine, Ser: 0.68 mg/dL (ref 0.44–1.00)
GFR, Estimated: >60 mL/min (ref >60)
Glucose, Bld: 117 mg/dL — ABNORMAL HIGH (ref 70–99)
Potassium: 3.8 mmol/L (ref 3.5–5.1)
Sodium: 133 mmol/L — ABNORMAL LOW (ref 135–145)

## 2023-08-25 LAB — GLUCOSE, CAPILLARY
Glucose-Capillary: 101 mg/dL — ABNORMAL HIGH (ref 70–99)
Glucose-Capillary: 80 mg/dL (ref 70–99)
Glucose-Capillary: 70 mg/dL (ref 70–99)
Glucose-Capillary: 141 mg/dL — ABNORMAL HIGH (ref 70–99)

## 2023-08-25 LAB — HEMOGLOBIN AND HEMATOCRIT, BLOOD
HCT: 25.9 % — ABNORMAL LOW (ref 36.0–46.0)
Hemoglobin: 7.9 g/dL — ABNORMAL LOW (ref 12.0–15.0)

## 2023-08-25 MED ORDER — LORAZEPAM 2 MG/ML IJ SOLN
INTRAMUSCULAR | Status: AC
  Filled 2023-08-25: qty 1

## 2023-08-25 MED ORDER — LORAZEPAM 2 MG/ML IJ SOLN
1.0000 mg | INTRAMUSCULAR | Status: DC | PRN
  Administered 2023-08-25: 1 mg via INTRAVENOUS

## 2023-08-25 NOTE — Progress Notes (Addendum)
Patient continues to be confused and trying to get out of chair, pulling at lines, after being continuously reoriented. Rolla Plate, NP notified. Order for telesitter placed.

## 2023-08-25 NOTE — Progress Notes (Signed)
Patient continues to be agitated, arguing, paranoid and picking at iv/wound vacs. Attempts at deescalation and redirection are very difficult. Sitter remains at bedside, patient remains in bed despite attempts to get up. Dr. Wyn Quaker contacted and awaiting response.

## 2023-08-25 NOTE — Evaluation (Signed)
Occupational Therapy Evaluation Patient Details Name: Yolanda Jimenez MRN: [id number] DOB: Dec 25, 1945 Today's Date: 08/25/2023   History of Present Illness Pt is a 78 y/o F admitted on 08/24/23 for scheduled bilateral femoral endarterectomy. PMH: a-fib, asthma, AVM, COPD, depression, DM, heart murmur, HLD, IBS, PVD   Clinical Impression   Ms Orlosky was seen for OT evaluation this date. Prior to hospital admission, pt was IND. Pt lives alone with family available as needed. Pt currently requires MIN A don pants in standing. CGA + RW for ADL t/f ~100 ft. SpO2 95% on RA with activity. Son in room at end of session helping to reorient patient as pt perseverative on where her car is (pt did not drive to surgery). Pt would benefit from skilled OT to address noted impairments and functional limitations (see below for any additional details). Upon hospital discharge, recommend OT follow up.     If plan is discharge home, recommend the following: A little help with bathing/dressing/bathroom;Supervision due to cognitive status;Help with stairs or ramp for entrance    Functional Status Assessment  Patient has had a recent decline in their functional status and demonstrates the ability to make significant improvements in function in a reasonable and predictable amount of time.  Equipment Recommendations  BSC/3in1    Recommendations for Other Services       Precautions / Restrictions Precautions Precautions: Fall Restrictions Weight Bearing Restrictions Per Provider Order: No      Mobility Bed Mobility               General bed mobility comments: not tested    Transfers Overall transfer level: Needs assistance Equipment used: None Transfers: Sit to/from Stand Sit to Stand: Contact guard assist                  Balance Overall balance assessment: Needs assistance Sitting-balance support: Feet supported Sitting balance-Leahy Scale: Good     Standing balance support: No  upper extremity supported, During functional activity Standing balance-Leahy Scale: Fair                             ADL either performed or assessed with clinical judgement   ADL Overall ADL's : Needs assistance/impaired                                       General ADL Comments: MIN A don pants in standing. CGA + RW for ADL t/f ~100 ft.      Pertinent Vitals/Pain Pain Assessment Pain Assessment: No/denies pain     Extremity/Trunk Assessment Upper Extremity Assessment Upper Extremity Assessment: Overall WFL for tasks assessed   Lower Extremity Assessment Lower Extremity Assessment: Overall WFL for tasks assessed       Communication Communication Communication: No apparent difficulties   Cognition Arousal: Alert Behavior During Therapy: WFL for tasks assessed/performed Overall Cognitive Status: Within Functional Limits for tasks assessed                                                  Home Living Family/patient expects to be discharged to:: Private residence Living Arrangements: Alone Available Help at Discharge: Family;Friend(s);Available 24 hours/day Type of Home: House Home Access: Level entry  Home Layout: One level               Home Equipment: Agricultural consultant (2 wheels);Rollator (4 wheels);Cane - single point;Wheelchair - manual          Prior Functioning/Environment Prior Level of Function : Independent/Modified Independent;Working/employed             Mobility Comments: Independent without AD, driving, possibly 1 fall in the past 6 months.          OT Problem List: Decreased strength;Decreased range of motion;Decreased activity tolerance;Impaired balance (sitting and/or standing);Decreased safety awareness      OT Treatment/Interventions: Self-care/ADL training;Therapeutic exercise;Energy conservation;DME and/or AE instruction;Therapeutic activities    OT Goals(Current goals can be  found in the care plan section) Acute Rehab OT Goals Patient Stated Goal: to go home OT Goal Formulation: With patient/family Time For Goal Achievement: 09/08/23 Potential to Achieve Goals: Good ADL Goals Pt Will Perform Grooming: with modified independence;standing Pt Will Perform Lower Body Dressing: with modified independence;sit to/from stand Pt Will Transfer to Toilet: with modified independence;ambulating;regular height toilet  OT Frequency: Min 1X/week    Co-evaluation              AM-PAC OT "6 Clicks" Daily Activity     Outcome Measure Help from another person eating meals?: None Help from another person taking care of personal grooming?: None Help from another person toileting, which includes using toliet, bedpan, or urinal?: A Little Help from another person bathing (including washing, rinsing, drying)?: A Little Help from another person to put on and taking off regular upper body clothing?: None Help from another person to put on and taking off regular lower body clothing?: A Little 6 Click Score: 21   End of Session Equipment Utilized During Treatment: Rolling walker (2 wheels) Nurse Communication: Mobility status  Activity Tolerance: Patient tolerated treatment well Patient left: in chair;with call bell/phone within reach;with chair alarm set;with family/visitor present  OT Visit Diagnosis: Other abnormalities of gait and mobility (R26.89);Muscle weakness (generalized) (M62.81)                Time: 2440-1027 OT Time Calculation (min): 32 min Charges:  OT General Charges $OT Visit: 1 Visit OT Evaluation $OT Eval Moderate Complexity: 1 Mod OT Treatments $Self Care/Home Management : 8-22 mins  Kathie Dike, M.S. OTR/L  08/25/23, 2:21 PM  ascom [phone]

## 2023-08-25 NOTE — Progress Notes (Signed)
Rolla Plate notified that patient becoming increasingly confused and impulsive.[name] AxOx3. Disoriented to place. Trying to stand up after being reminded to stay in chair. Baseline is AxOx4. Ordered to get 12-lead EKG. Placed pt on 2 L [place]. Brien came to bedside to see pt. No further orders.

## 2023-08-25 NOTE — Progress Notes (Signed)
Patient is profoundly disoriented and paranoid. She is alert and oriented to self and intermittently place but can no longer recall why she is in the hospital, the month or year. She is agitated and adamant her surgery was multiple days ago and Dr. Wyn Quaker never told her she would have to spend the night. She can recall conversations had the night of 1/15 but feels they were days ago. It has taken 2 hours, 2 nurses and 1 nurse tech to deescalate and get her safely into bed for the night. She voices concern for rapists and murderers in the building, she has been repeatedly assured that she is safe and the staff here is only here to provide safe care for her during her recovery. The use of a telesitter was initiated on day shift but is not at all effective this evening. Dr. Wyn Quaker has been notified and a bedside sitter has been ordered and assigned.

## 2023-08-25 NOTE — Evaluation (Addendum)
Physical Therapy Evaluation Patient Details Name: Yolanda Jimenez MRN: [id number] DOB: Jan 10, 1946 Today's Date: 08/25/2023  History of Present Illness  Pt is a 78 y/o F admitted on 08/24/23 for scheduled bilateral femoral endarterectomy. PMH: a-fib, asthma, AVM, COPD, depression, DM, heart murmur, HLD, IBS, PVD  Clinical Impression  Pt seen for PT evaluation with pt agreeable. Pt reports prior to admission she was independent without AD, driving, living alone in a 1 level condo with level entry. On this date, pt is able to complete bed mobility with mod I, bed>recliner with CGA. Pt endorses B groin pain but further mobility limited by low BP & pt with c/o "wooziness" with change in position. Will continue to follow pt acutely to progress mobility as able. Pt may benefit from trial with RW to decrease pain with mobility.  BP in LUE: Sitting EOB: 83/61 mmHg MAP 69 Sitting in recliner after transfer: 86/41 mmHg MAP 54 Sitting with BLE elevated: 106/65 mmHg MAP 79    PT educated pt on wound vacs.    If plan is discharge home, recommend the following: A little help with walking and/or transfers;Assist for transportation;A little help with bathing/dressing/bathroom;Assistance with cooking/housework;Help with stairs or ramp for entrance   Can travel by private vehicle        Equipment Recommendations None recommended by PT  Recommendations for Other Services       Functional Status Assessment Patient has had a recent decline in their functional status and demonstrates the ability to make significant improvements in function in a reasonable and predictable amount of time.     Precautions / Restrictions Precautions Precautions: Fall Restrictions Weight Bearing Restrictions Per Provider Order: No      Mobility  Bed Mobility Overal bed mobility: Modified Independent Bed Mobility: Supine to Sit     Supine to sit: Modified independent (Device/Increase time), HOB elevated, Used rails           Transfers Overall transfer level: Needs assistance Equipment used: None Transfers: Sit to/from Stand, Bed to chair/wheelchair/BSC Sit to Stand: Contact guard assist   Step pivot transfers: Contact guard assist (bed>recliner on R with HHA)            Ambulation/Gait                  Stairs            Wheelchair Mobility     Tilt Bed    Modified Rankin (Stroke Patients Only)       Balance Overall balance assessment: Needs assistance Sitting-balance support: Feet supported Sitting balance-Leahy Scale: Good     Standing balance support: During functional activity, Single extremity supported, No upper extremity supported Standing balance-Leahy Scale: Fair                               Pertinent Vitals/Pain Pain Assessment Pain Assessment: Faces Faces Pain Scale: Hurts even more Pain Location: B groin incisions Pain Descriptors / Indicators: Discomfort, Guarding, Sore Pain Intervention(s): Limited activity within patient's tolerance, Monitored during session, Repositioned    Home Living Family/patient expects to be discharged to:: Private residence Living Arrangements: Alone Available Help at Discharge: Family;Friend(s);Available 24 hours/day (son could provide 24 hr assist if necessary) Type of Home: Other(Comment) (condo) Home Access: Level entry       Home Layout: One level Home Equipment: Agricultural consultant (2 wheels);Rollator (4 wheels);Cane - single point;Wheelchair - manual  Prior Function Prior Level of Function : Independent/Modified Independent;Working/employed             Mobility Comments: Independent without AD, driving, possibly 1 fall in the past 6 months.       Extremity/Trunk Assessment   Upper Extremity Assessment Upper Extremity Assessment: Overall WFL for tasks assessed    Lower Extremity Assessment Lower Extremity Assessment: Generalized weakness;Overall WFL for tasks assessed        Communication   Communication Communication: No apparent difficulties  Cognition Arousal: Alert Behavior During Therapy: WFL for tasks assessed/performed Overall Cognitive Status: Within Functional Limits for tasks assessed                                          General Comments      Exercises     Assessment/Plan    PT Assessment Patient needs continued PT services  PT Problem List Decreased strength;Decreased coordination;Pain;Decreased range of motion;Decreased activity tolerance;Decreased balance;Decreased mobility;Decreased knowledge of precautions;Decreased knowledge of use of DME;Decreased safety awareness       PT Treatment Interventions DME instruction;Balance training;Neuromuscular re-education;Gait training;Stair training;Functional mobility training;Therapeutic activities;Therapeutic exercise;Manual techniques;Patient/family education;Modalities    PT Goals (Current goals can be found in the Care Plan section)  Acute Rehab PT Goals Patient Stated Goal: decreased pain, get better, go home PT Goal Formulation: With patient Time For Goal Achievement: 09/08/23 Potential to Achieve Goals: Good    Frequency Min 1X/week     Co-evaluation               AM-PAC PT "6 Clicks" Mobility  Outcome Measure Help needed turning from your back to your side while in a flat bed without using bedrails?: None Help needed moving from lying on your back to sitting on the side of a flat bed without using bedrails?: None Help needed moving to and from a bed to a chair (including a wheelchair)?: A Little Help needed standing up from a chair using your arms (e.g., wheelchair or bedside chair)?: A Little Help needed to walk in hospital room?: A Little Help needed climbing 3-5 steps with a railing? : A Little 6 Click Score: 20    End of Session   Activity Tolerance: Patient limited by fatigue;Treatment limited secondary to medical complications (Comment)  (limited 2/2 low BP) Patient left: in chair;with call bell/phone within reach (MD in room)   PT Visit Diagnosis: Muscle weakness (generalized) (M62.81);Pain;Other abnormalities of gait and mobility (R26.89);Difficulty in walking, not elsewhere classified (R26.2) Pain - Right/Left:  (bilateral) Pain - part of body:  (groin incisions)    Time: 3295-1884 PT Time Calculation (min) (ACUTE ONLY): 21 min   Charges:   PT Evaluation $PT Eval Moderate Complexity: 1 Mod   PT General Charges $$ ACUTE PT VISIT: 1 Visit         Aleda Grana, PT, DPT 08/25/23, 10:47 AM   Sandi Mariscal 08/25/2023, 10:45 AM

## 2023-08-25 NOTE — Progress Notes (Signed)
Progress Note    08/25/2023 7:29 AM 1 Day Post-Op  Subjective:  Yolanda Jimenez is a 78 yo female now POD #1 from bilateral common femoral, profunda femoris, and superficial femoral artery endarterectomies. Also had bilateral common iliac artery angioplasty and lithotripsy and placement of endologix aortic stent graft. Patient resting comfortably in bed this morning.  No complaints overnight.  Vitals are remained stable.   Vitals:   08/25/23 0500 08/25/23 0600  BP: (!) 97/57 (!) 104/58  Pulse: (!) 59 (!) 59  Resp: 13 13  Temp:    SpO2: 98% 93%   Physical Exam: Cardiac:  RRR, Normal S1,S2 No rubs clicks or murmurs. Lungs:  Clear on auscultation throughout but diminished in the bases.  Incisions: Bilateral groin incisions with Prevena wound vacs in place. Extremities: Bilaterally warm to touch with positive Doppler DP/PT pulses. Abdomen: Positive bowel sounds, soft, nontender nondistended. Neurologic: Alert and oriented x 3, answers all questions and follows commands appropriately.  CBC    Component Value Date/Time   WBC 11.5 (H) 08/25/2023 0021   RBC 2.51 (L) 08/25/2023 0021   HGB 7.7 (L) 08/25/2023 0021   HGB 10.7 (L) 08/15/2023 1341   HCT 25.3 (L) 08/25/2023 0021   HCT 35.8 08/15/2023 1341   PLT 248 08/25/2023 0021   PLT 513 (H) 08/15/2023 1341   MCV 100.8 (H) 08/25/2023 0021   MCV 98 (H) 08/15/2023 1341   MCV 87 07/12/2014 2035   MCH 30.7 08/25/2023 0021   MCHC 30.4 08/25/2023 0021   RDW 21.1 (H) 08/25/2023 0021   RDW 16.9 (H) 08/15/2023 1341   RDW 18.1 (H) 07/12/2014 2035   LYMPHSABS 0.4 (L) 08/17/2023 1050   LYMPHSABS 0.8 08/15/2023 1341   MONOABS 0.6 08/17/2023 1050   EOSABS 0.0 08/17/2023 1050   EOSABS 0.1 08/15/2023 1341   BASOSABS 0.1 08/17/2023 1050   BASOSABS 0.1 08/15/2023 1341    BMET    Component Value Date/Time   NA 133 (L) 08/25/2023 0021   NA 142 07/12/2014 2035   K 3.8 08/25/2023 0021   K 3.4 (L) 07/12/2014 2035   CL 105 08/25/2023 0021    CL 107 07/12/2014 2035   CO2 20 (L) 08/25/2023 0021   CO2 30 07/12/2014 2035   GLUCOSE 117 (H) 08/25/2023 0021   GLUCOSE 133 (H) 07/12/2014 2035   BUN 11 08/25/2023 0021   BUN 11 07/12/2014 2035   CREATININE 0.68 08/25/2023 0021   CREATININE 0.73 07/12/2014 2035   CALCIUM 7.4 (L) 08/25/2023 0021   CALCIUM 8.3 (L) 07/12/2014 2035   GFRNONAA >60 08/25/2023 0021   GFRNONAA >60 07/12/2014 2035   GFRAA >60 05/18/2018 0515   GFRAA >60 07/12/2014 2035    INR    Component Value Date/Time   INR 1.2 12/12/2021 2350     Intake/Output Summary (Last 24 hours) at 08/25/2023 0729 Last data filed at 08/25/2023 0600 Gross per 24 hour  Intake 3612.93 ml  Output 1525 ml  Net 2087.93 ml     Assessment/Plan:  78 y.o. female is s/p bilateral common femoral, profunda femoris, and superficial femoral artery endarterectomies. Also had bilateral common iliac artery angioplasty and lithotripsy and placement of endologix aortic stent graft.  1 Day Post-Op   PLAN Hemoglobin and hematocrit check at noon Pain medications as needed Advance diet as tolerated Discontinue Foley catheter Discontinue IV fluids if taking p.o. well PT OT consults OOB to chair today Dispo transfer to floor later this afternoon if she does well. Start aspirin  81 mg p.o. daily, Plavix 75 mg p.o. daily  DVT prophylaxis: ASA 81 mg daily, Plavix 75 mg daily.   Marcie Bal [facility] 08/25/2023 7:29 AM

## 2023-08-25 NOTE — Plan of Care (Signed)
  Problem: Education: Goal: Knowledge of General Education information will improve Description: Including pain rating scale, medication(s)/side effects and non-pharmacologic comfort measures Outcome: Progressing   Problem: Clinical Measurements: Goal: Ability to maintain clinical measurements within normal limits will improve Outcome: Progressing Goal: Will remain free from infection Outcome: Progressing Goal: Diagnostic test results will improve Outcome: Progressing Goal: Respiratory complications will improve Outcome: Progressing Goal: Cardiovascular complication will be avoided Outcome: Progressing   Problem: Activity: Goal: Risk for activity intolerance will decrease Outcome: Progressing   Problem: Nutrition: Goal: Adequate nutrition will be maintained Outcome: Progressing   Problem: Elimination: Goal: Will not experience complications related to bowel motility Outcome: Progressing Goal: Will not experience complications related to urinary retention Outcome: Progressing   Problem: Pain Managment: Goal: General experience of comfort will improve and/or be controlled Outcome: Progressing   Problem: Safety: Goal: Ability to remain free from injury will improve Outcome: Progressing   Problem: Skin Integrity: Goal: Risk for impaired skin integrity will decrease Outcome: Progressing   Problem: Education: Goal: Knowledge of prescribed regimen will improve Outcome: Progressing   Problem: Activity: Goal: Ability to tolerate increased activity will improve Outcome: Progressing   Problem: Bowel/Gastric: Goal: Gastrointestinal status for postoperative course will improve Outcome: Progressing   Problem: Clinical Measurements: Goal: Postoperative complications will be avoided or minimized Outcome: Progressing Goal: Signs and symptoms of graft occlusion will improve Outcome: Progressing   Problem: Skin Integrity: Goal: Demonstration of wound healing without  infection will improve Outcome: Progressing

## 2023-08-26 ENCOUNTER — Inpatient Hospital Stay: Payer: PPO

## 2023-08-26 ENCOUNTER — Encounter: Payer: Self-pay | Admitting: Vascular Surgery

## 2023-08-26 LAB — GI PROFILE, STOOL, PCR

## 2023-08-26 LAB — GLUCOSE, CAPILLARY
Glucose-Capillary: 104 mg/dL — ABNORMAL HIGH (ref 70–99)
Glucose-Capillary: 112 mg/dL — ABNORMAL HIGH (ref 70–99)
Glucose-Capillary: 103 mg/dL — ABNORMAL HIGH (ref 70–99)
Glucose-Capillary: 141 mg/dL — ABNORMAL HIGH (ref 70–99)

## 2023-08-26 LAB — SURGICAL PATHOLOGY

## 2023-08-26 LAB — CALPROTECTIN, FECAL: Calprotectin, Fecal: 36 ug/g (ref 0–120)

## 2023-08-26 LAB — PANCREATIC ELASTASE, FECAL: Pancreatic Elastase, Fecal: 513 ug Elast./g (ref >200)

## 2023-08-26 MED ORDER — LORAZEPAM 1 MG PO TABS
1.0000 mg | ORAL_TABLET | ORAL | Status: DC | PRN
  Administered 2023-08-26 (×2): 1 mg via ORAL
  Filled 2023-08-26 (×2): qty 1

## 2023-08-26 MED ORDER — FOLIC ACID 1 MG PO TABS
1.0000 mg | ORAL_TABLET | Freq: Every day | ORAL | Status: DC
  Administered 2023-08-26 – 2023-09-03 (×9): 1 mg via ORAL
  Filled 2023-08-26 (×9): qty 1

## 2023-08-26 MED ORDER — LORAZEPAM 2 MG/ML IJ SOLN
1.0000 mg | INTRAMUSCULAR | Status: DC | PRN
  Filled 2023-08-26: qty 1

## 2023-08-26 MED ORDER — HALOPERIDOL LACTATE 5 MG/ML IJ SOLN: 5.0000 mg | Freq: Four times a day (QID) | INTRAMUSCULAR | Status: DC | PRN

## 2023-08-26 MED ORDER — HALOPERIDOL LACTATE 5 MG/ML IJ SOLN: 5.0000 mg | Freq: Once | INTRAMUSCULAR | Status: DC

## 2023-08-26 MED ORDER — THIAMINE MONONITRATE 100 MG PO TABS
100.0000 mg | ORAL_TABLET | Freq: Every day | ORAL | Status: DC
  Administered 2023-08-26 – 2023-09-03 (×9): 100 mg via ORAL
  Filled 2023-08-26 (×9): qty 1

## 2023-08-26 MED ORDER — ADULT MULTIVITAMIN W/MINERALS CH
1.0000 | ORAL_TABLET | Freq: Every day | ORAL | Status: DC
  Administered 2023-08-26 – 2023-09-03 (×8): 1 via ORAL
  Filled 2023-08-26 (×9): qty 1

## 2023-08-26 MED ORDER — LORAZEPAM 2 MG/ML IJ SOLN
1.0000 mg | INTRAMUSCULAR | Status: DC | PRN
  Administered 2023-08-26: 1 mg via INTRAVENOUS

## 2023-08-26 MED ORDER — LORAZEPAM 2 MG/ML IJ SOLN: 1.0000 mg | INTRAMUSCULAR | Status: DC | PRN

## 2023-08-26 NOTE — Progress Notes (Signed)
Progress Note    08/26/2023 7:49 AM 2 Days Post-Op  Subjective:  Mauriana Fogleman is a 78 yo female now POD #1 from bilateral common femoral, profunda femoris, and superficial femoral artery endarterectomies. Also had bilateral common iliac artery angioplasty and lithotripsy and placement of endologix aortic stent graft. Patient patient has become very confused and agitated overnight.  Patient was treated with some Ativan and some Haldol.  This morning the patient remains confused and agitated and started on CIWA protocol.  No complaints overnight.  Vitals are remained stable.    Vitals:   08/26/23 0100 08/26/23 0200  BP: (!) 150/76 (!) 111/56  Pulse: 79 74  Resp: 20 15  Temp:    SpO2: 97% 93%   Physical Exam: Cardiac:  RRR, Normal S1,S2 No rubs clicks or murmurs. Lungs:  Clear on auscultation throughout but diminished in the bases.  Incisions: Bilateral groin incisions with Prevena wound vacs in place. Extremities: Bilaterally warm to touch with positive Doppler DP/PT pulses. Abdomen: Positive bowel sounds, soft, nontender nondistended. Neurologic: Alert and oriented x 3, answers all questions and follows commands appropriately.  CBC    Component Value Date/Time   WBC 11.5 (H) 08/25/2023 0021   RBC 2.51 (L) 08/25/2023 0021   HGB 7.9 (L) 08/25/2023 1233   HGB 10.7 (L) 08/15/2023 1341   HCT 25.9 (L) 08/25/2023 1233   HCT 35.8 08/15/2023 1341   PLT 248 08/25/2023 0021   PLT 513 (H) 08/15/2023 1341   MCV 100.8 (H) 08/25/2023 0021   MCV 98 (H) 08/15/2023 1341   MCV 87 07/12/2014 2035   MCH 30.7 08/25/2023 0021   MCHC 30.4 08/25/2023 0021   RDW 21.1 (H) 08/25/2023 0021   RDW 16.9 (H) 08/15/2023 1341   RDW 18.1 (H) 07/12/2014 2035   LYMPHSABS 0.4 (L) 08/17/2023 1050   LYMPHSABS 0.8 08/15/2023 1341   MONOABS 0.6 08/17/2023 1050   EOSABS 0.0 08/17/2023 1050   EOSABS 0.1 08/15/2023 1341   BASOSABS 0.1 08/17/2023 1050   BASOSABS 0.1 08/15/2023 1341    BMET    Component  Value Date/Time   NA 133 (L) 08/25/2023 0021   NA 142 07/12/2014 2035   K 3.8 08/25/2023 0021   K 3.4 (L) 07/12/2014 2035   CL 105 08/25/2023 0021   CL 107 07/12/2014 2035   CO2 20 (L) 08/25/2023 0021   CO2 30 07/12/2014 2035   GLUCOSE 117 (H) 08/25/2023 0021   GLUCOSE 133 (H) 07/12/2014 2035   BUN 11 08/25/2023 0021   BUN 11 07/12/2014 2035   CREATININE 0.68 08/25/2023 0021   CREATININE 0.73 07/12/2014 2035   CALCIUM 7.4 (L) 08/25/2023 0021   CALCIUM 8.3 (L) 07/12/2014 2035   GFRNONAA >60 08/25/2023 0021   GFRNONAA >60 07/12/2014 2035   GFRAA >60 05/18/2018 0515   GFRAA >60 07/12/2014 2035    INR    Component Value Date/Time   INR 1.2 12/12/2021 2350     Intake/Output Summary (Last 24 hours) at 08/26/2023 0749 Last data filed at 08/25/2023 2000 Gross per 24 hour  Intake 634.35 ml  Output 100 ml  Net 534.35 ml     Assessment/Plan:  78 y.o. female is s/p  bilateral common femoral, profunda femoris, and superficial femoral artery endarterectomies. Also had bilateral common iliac artery angioplasty and lithotripsy and placement of endologix aortic stent graft.  2 Days Post-Op   PLAN Continue CIWA protocol for confusion and agitation. Continue to work with PT OT Tylenol only for pain Daily  aspirin 81 mg and Plavix 75 mg daily Hemoglobin elevated this morning from yesterday.  No reason to transfuse Chest x-ray reads as normal.  DVT prophylaxis:  ASA 81 mg daily, Plavix 75 mg daily.    Marcie Bal [facility] 08/26/2023 7:49 AM

## 2023-08-26 NOTE — Progress Notes (Signed)
Per Dr. Wyn Quaker, ok to give 5mg  of haldol for anxiety/agitation.

## 2023-08-26 NOTE — Progress Notes (Signed)
Physical Therapy Treatment Patient Details Name: Yolanda Jimenez MRN: [id number] DOB: February 12, 1946 Today's Date: 08/26/2023   History of Present Illness Pt is a 78 y/o F admitted on 08/24/23 for scheduled bilateral femoral endarterectomy. PMH: a-fib, asthma, AVM, COPD, depression, DM, heart murmur, HLD, IBS, PVD    PT Comments  Pt seen for PT tx with pt agreeable, son Jomarie Longs) present for session. Pt is pleasant but much more confused today compared to yesterday, pt oriented to self & year only, unable to recall location within 1 minute of PT educating her. Pt requires max assist for STS & mod<>max assist for gait with RW. Pt has difficulty maneuvering RW even in controlled hallway environment. Pt would benefit from ongoing skilled PT treatment to address balance, strengthening, gait & safety with mobility.    If plan is discharge home, recommend the following: Assist for transportation;Assistance with cooking/housework;Help with stairs or ramp for entrance;A lot of help with walking and/or transfers;A lot of help with bathing/dressing/bathroom;Direct supervision/assist for medications management;Direct supervision/assist for financial management;Supervision due to cognitive status   Can travel by private vehicle     No  Equipment Recommendations  None recommended by PT    Recommendations for Other Services       Precautions / Restrictions Precautions Precautions: Fall Restrictions Weight Bearing Restrictions Per Provider Order: No     Mobility  Bed Mobility   Bed Mobility: Supine to Sit     Supine to sit: Supervision, Used rails, HOB elevated, Contact guard          Transfers Overall transfer level: Needs assistance Equipment used: Rolling walker (2 wheels) Transfers: Sit to/from Stand Sit to Stand: Mod assist, Max assist           General transfer comment: STS from EOB x 2-3 times with cuing re: hand placement to push to standing, increase base of support as pt  standing with very narrow BOS    Ambulation/Gait Ambulation/Gait assistance: Mod assist, Max assist Gait Distance (Feet): 50 Feet Assistive device: Rolling walker (2 wheels) Gait Pattern/deviations: Decreased step length - right, Decreased step length - left, Decreased stride length Gait velocity: decreased     General Gait Details: decreased step width, cuing & assistance for turning with RW, assistance for balance   Stairs             Wheelchair Mobility     Tilt Bed    Modified Rankin (Stroke Patients Only)       Balance Overall balance assessment: Needs assistance Sitting-balance support: Feet supported Sitting balance-Leahy Scale: Fair Sitting balance - Comments: close supervision for static sitting   Standing balance support: During functional activity, Bilateral upper extremity supported, Reliant on assistive device for balance Standing balance-Leahy Scale: Poor                              Cognition Arousal: Alert Behavior During Therapy: WFL for tasks assessed/performed Overall Cognitive Status: Impaired/Different from baseline Area of Impairment: Orientation, Attention, Memory, Following commands, Safety/judgement, Awareness, Problem solving                 Orientation Level: Disoriented to, Place, Situation (able to recall year)   Memory: Decreased short-term memory Following Commands: Follows one step commands inconsistently, Follows one step commands with increased time Safety/Judgement: Decreased awareness of safety, Decreased awareness of deficits Awareness: Intellectual Problem Solving: Slow processing, Decreased initiation, Difficulty sequencing, Requires verbal cues, Requires tactile cues General  Comments: PT educates pt on location throughout session but pt unable to recall even immediately after PT telling her; pt reports she's at [facility] or [employer]. Decreased ability to follow simple commands, decreased  safety awareness & overall awareness.        Exercises      General Comments General comments (skin integrity, edema, etc.): BP 112/70 sitting EOB      Pertinent Vitals/Pain Pain Assessment Pain Assessment: Faces Faces Pain Scale: Hurts a little bit Pain Location: B groin incisions Pain Descriptors / Indicators: Grimacing, Guarding Pain Intervention(s): Monitored during session, Limited activity within patient's tolerance    Home Living                          Prior Function            PT Goals (current goals can now be found in the care plan section) Acute Rehab PT Goals Patient Stated Goal: decreased pain, get better, go home PT Goal Formulation: With patient Time For Goal Achievement: 09/08/23 Potential to Achieve Goals: Fair Progress towards PT goals: Progressing toward goals    Frequency    Min 1X/week      PT Plan      Co-evaluation              AM-PAC PT "6 Clicks" Mobility   Outcome Measure  Help needed turning from your back to your side while in a flat bed without using bedrails?: A Little Help needed moving from lying on your back to sitting on the side of a flat bed without using bedrails?: A Lot Help needed moving to and from a bed to a chair (including a wheelchair)?: Total Help needed standing up from a chair using your arms (e.g., wheelchair or bedside chair)?: Total Help needed to walk in hospital room?: Total Help needed climbing 3-5 steps with a railing? : Total 6 Click Score: 9    End of Session   Activity Tolerance: Patient tolerated treatment well;Patient limited by fatigue Patient left: in chair;with family/visitor present;with nursing/sitter in room Nurse Communication: Mobility status PT Visit Diagnosis: Muscle weakness (generalized) (M62.81);Pain;Other abnormalities of gait and mobility (R26.89);Difficulty in walking, not elsewhere classified (R26.2) Pain - Right/Left:  (bilateral groins) Pain - part of body:   (groins/incisions)     Time: 5409-8119 PT Time Calculation (min) (ACUTE ONLY): 17 min  Charges:    $Therapeutic Activity: 8-22 mins PT General Charges $$ ACUTE PT VISIT: 1 Visit                     Aleda Grana, PT, DPT 08/26/23, 11:52 AM   Sandi Mariscal 08/26/2023, 11:51 AM

## 2023-08-26 NOTE — Progress Notes (Addendum)
Patient consistently taking off EKG leads despite 1:1 sitter and frequent redirection. NP Rolla Plate notified.   Patient frequently taking of SpO2 probe despite frequent redirection and 1:1 sitter.

## 2023-08-26 NOTE — Plan of Care (Signed)
  Problem: Clinical Measurements: Goal: Ability to maintain clinical measurements within normal limits will improve Outcome: Progressing Goal: Will remain free from infection Outcome: Progressing Goal: Diagnostic test results will improve Outcome: Progressing Goal: Respiratory complications will improve Outcome: Progressing Goal: Cardiovascular complication will be avoided Outcome: Progressing   Problem: Nutrition: Goal: Adequate nutrition will be maintained Outcome: Progressing   Problem: Coping: Goal: Level of anxiety will decrease Outcome: Progressing   Problem: Elimination: Goal: Will not experience complications related to bowel motility Outcome: Progressing   Problem: Pain Managment: Goal: General experience of comfort will improve and/or be controlled Outcome: Progressing   Problem: Education: Goal: Knowledge of General Education information will improve Description: Including pain rating scale, medication(s)/side effects and non-pharmacologic comfort measures Outcome: Not Progressing   Problem: Health Behavior/Discharge Planning: Goal: Ability to manage health-related needs will improve Outcome: Not Progressing   Problem: Activity: Goal: Risk for activity intolerance will decrease Outcome: Not Progressing

## 2023-08-26 NOTE — Plan of Care (Signed)
  Problem: Clinical Measurements: Goal: Will remain free from infection Outcome: Progressing Goal: Diagnostic test results will improve Outcome: Progressing Goal: Respiratory complications will improve Outcome: Progressing Goal: Cardiovascular complication will be avoided Outcome: Progressing   Problem: Elimination: Goal: Will not experience complications related to bowel motility Outcome: Progressing Goal: Will not experience complications related to urinary retention Outcome: Progressing   Problem: Skin Integrity: Goal: Risk for impaired skin integrity will decrease Outcome: Progressing   Problem: Bowel/Gastric: Goal: Gastrointestinal status for postoperative course will improve Outcome: Progressing   Problem: Clinical Measurements: Goal: Signs and symptoms of graft occlusion will improve Outcome: Progressing   Problem: Skin Integrity: Goal: Demonstration of wound healing without infection will improve Outcome: Progressing

## 2023-08-26 NOTE — Progress Notes (Signed)
[facility]  Daily Progress Note   Subjective  -   Very combative and confused overnight.  Responded well to Ativan and then was able to sleep for several hours.  Takes Valium at home.  Leg pain is improved.  Objective Vitals:   08/26/23 0000 08/26/23 0100 08/26/23 0200 08/26/23 0800  BP: (!) 117/59 (!) 150/76 (!) 111/56 (!) 124/59  Pulse: 65 79 74   Resp: 20 20 15 13   Temp: 98.1 F (36.7 C)     TempSrc: Oral     SpO2: 97% 97% 93%   Weight:      Height:        Intake/Output Summary (Last 24 hours) at 08/26/2023 0818 Last data filed at 08/26/2023 0700 Gross per 24 hour  Intake 634.35 ml  Output 350 ml  Net 284.35 ml    PULM  CTAB CV  RRR VASC  pulses present bilaterally with good seal on the incisional vacs on both groins.  Laboratory CBC    Component Value Date/Time   WBC 11.5 (H) 08/25/2023 0021   HGB 7.9 (L) 08/25/2023 1233   HGB 10.7 (L) 08/15/2023 1341   HCT 25.9 (L) 08/25/2023 1233   HCT 35.8 08/15/2023 1341   PLT 248 08/25/2023 0021   PLT 513 (H) 08/15/2023 1341    BMET    Component Value Date/Time   NA 133 (L) 08/25/2023 0021   NA 142 07/12/2014 2035   K 3.8 08/25/2023 0021   K 3.4 (L) 07/12/2014 2035   CL 105 08/25/2023 0021   CL 107 07/12/2014 2035   CO2 20 (L) 08/25/2023 0021   CO2 30 07/12/2014 2035   GLUCOSE 117 (H) 08/25/2023 0021   GLUCOSE 133 (H) 07/12/2014 2035   BUN 11 08/25/2023 0021   BUN 11 07/12/2014 2035   CREATININE 0.68 08/25/2023 0021   CREATININE 0.73 07/12/2014 2035   CALCIUM 7.4 (L) 08/25/2023 0021   CALCIUM 8.3 (L) 07/12/2014 2035   GFRNONAA >60 08/25/2023 0021   GFRNONAA >60 07/12/2014 2035   GFRAA >60 05/18/2018 0515   GFRAA >60 07/12/2014 2035    Assessment/Planning: POD #2 s/p bilateral femoral endarterectomies, aorta and bilateral iliac artery stent placement.  Had significant sundowning overnight.  Likely some baseline cognitive decline as well. Very combative requiring a  sitter. Potentially getting out to a floor bed with help with her confusion today but she will likely need a sitter at night. Ultimately her mental status will do better at home than it does here more than likely. Increase activity today.  Ambulate with assistance and potentially discharge home this weekend   Festus Barren  08/26/2023, 8:18 AM

## 2023-08-27 LAB — GLUCOSE, CAPILLARY
Glucose-Capillary: 86 mg/dL (ref 70–99)
Glucose-Capillary: 165 mg/dL — ABNORMAL HIGH (ref 70–99)
Glucose-Capillary: 75 mg/dL (ref 70–99)
Glucose-Capillary: 120 mg/dL — ABNORMAL HIGH (ref 70–99)

## 2023-08-27 NOTE — TOC Initial Note (Signed)
Transition of Care [facility]) - Initial/Assessment Note    Patient Details  Name: Yolanda Jimenez MRN: [id number] Date of Birth: August 13, 1945  [facility] CM/SW Contact:    Maree Krabbe, LCSW Phone Number: 08/27/2023, 9:36 AM  Clinical Narrative:    Pt disoriented to situation. Pt is also asleep in the room. Pt has a Comptroller. SW called pt's son Jomarie Longs. Jomarie Longs was about ot head up to the hospital. Jomarie Longs was in the room yesterday when PT changed their recommendation to SNF. Jomarie Longs states they are definitely agreeable to SNF and would like to see her options OTHER THAN [facility]. They do not want her to go to [facility]. SW explained she would send the referral to the [place] facilities and then the SW could provide them with the options. Jomarie Longs agreeable.             Expected Discharge Plan: Skilled Nursing Facility Barriers to Discharge: Continued Medical Work up   Patient Goals and CMS Choice Patient states their goals for this hospitalization and ongoing recovery are::  (Per son for pt to go to SNF) CMS Medicare.gov Compare Post Acute Care list provided to:: Patient Represenative (must comment) Choice offered to / list presented to : Adult Children [place] ownership interest in [facility].provided to:: Adult Children    Expected Discharge Plan and Services In-house Referral: Clinical Social Work   Post Acute Care Choice: Skilled Nursing Facility Living arrangements for the past 2 months: Single Family Home                                      Prior Living Arrangements/Services Living arrangements for the past 2 months: Single Family Home Lives with:: Self Patient language and need for interpreter reviewed:: Yes Do you feel safe going back to the place where you live?: Yes      Need for Family Participation in Patient Care: Yes (Comment) Care giver support system in place?: Yes (comment)   Criminal Activity/Legal Involvement Pertinent to Current  Situation/Hospitalization: No - Comment as needed  Activities of Daily Living      Permission Sought/Granted Permission sought to share information with : Family Supports Permission granted to share information with : Yes, Verbal Permission Granted  Share Information with NAME: Maryelizabeth Kaufmann     Permission granted to share info w Relationship: son     Emotional Assessment Appearance:: Appears stated age Attitude/Demeanor/Rapport: Unable to Assess Affect (typically observed): Unable to Assess Orientation: : Oriented to Self, Oriented to Place, Oriented to  Time Alcohol / Substance Use: Not Applicable Psych Involvement: No (comment)  Admission diagnosis:  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) [I70.219] Patient Active Problem List   Diagnosis Date Noted   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) 08/24/2023   Epigastric hernia    Atherosclerosis of native arteries of extremity with intermittent claudication (HCC) 03/29/2023   Trochanteric avulsion fracture of femur (HCC) 12/10/2021   History of colonic polyps    Polyp of transverse colon    Major depressive disorder, recurrent, 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   Loss of weight    Polyp of ascending colon    Moderate protein-calorie malnutrition (HCC) 09/25/2018   Tubular adenoma 09/25/2018   Adult idiopathic generalized osteoporosis 08/03/2018   Aortic atherosclerosis (HCC) 08/03/2018   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   Cellulitis of left hip 06/01/2018   Status  post hip hemiarthroplasty 05/17/2018   Hip fracture (HCC) 05/15/2018   Tobacco abuse 04/04/2018   DM type 2 with diabetic mixed hyperlipidemia (HCC) 03/08/2018   Low serum vitamin D 09/09/2017   History of Barrett's esophagus    Esophagitis    PAD (peripheral artery disease) (HCC) 10/15/2016   Carotid stenosis 10/15/2016   Pain in toe 09/17/2016   Renal cyst, acquired, left 09/13/2016   Medicare annual  wellness visit, initial 09/03/2016   Type 2 diabetes mellitus with peripheral angiopathy (HCC) 08/29/2014   Combined fat and carbohydrate induced hyperlipemia 08/29/2014   Incomplete emptying of bladder 07/24/2014   Increased frequency of urination 03/19/2014   Lower abdominal pain 03/02/2014   Microscopic hematuria 03/01/2014   Nocturia 03/01/2014   Urinary tract infection, site not specified 03/01/2014   Diarrhea 03/05/2013   COLONIC POLYPS, ADENOMATOUS 09/05/2007   Hyperlipidemia 09/05/2007   Iron deficiency anemia 09/05/2007   BARRETTS ESOPHAGUS 09/05/2007   Diverticulosis of colon 09/05/2007   IRRITABLE BOWEL SYNDROME 09/05/2007   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 [place] [address] Phone: [phone] Fax: [phone]     Social Drivers of Health (SDOH) Social History: SDOH Screenings   Food Insecurity: No Food Insecurity (05/17/2023)   Received from [facility]  Housing: Unknown (05/17/2023)   Received from [facility]  Transportation Needs: No Transportation Needs (05/17/2023)   Received from [facility]  Utilities: Not At Risk (05/17/2023)   Received from [facility]  Depression [phone]): Low Risk  (12/23/2022)  Financial Resource Strain: Low Risk  (05/17/2023)   Received from [facility]  Tobacco Use: High Risk (08/24/2023)   SDOH Interventions:     Readmission Risk Interventions     No data to display

## 2023-08-27 NOTE — [place] FL2 (Signed)
[place] MEDICAID [id number] LEVEL OF CARE FORM     IDENTIFICATION  Patient Name: Yolanda Jimenez Birthdate: 06-18-1946 Sex: female Admission Date (Current Location): 08/24/2023  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) 08/24/2023   Epigastric hernia    Atherosclerosis of native arteries of extremity with intermittent claudication (HCC) 03/29/2023   Trochanteric avulsion fracture of femur (HCC) 12/10/2021   History of colonic polyps    Polyp of transverse colon    Major depressive disorder, recurrent, 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   Loss of weight    Polyp of ascending colon    Moderate protein-calorie malnutrition (HCC) 09/25/2018   Tubular adenoma 09/25/2018   Adult idiopathic generalized osteoporosis 08/03/2018   Aortic atherosclerosis (HCC) 08/03/2018   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   Cellulitis of left hip 06/01/2018   Status post hip hemiarthroplasty 05/17/2018   Hip fracture (HCC) 05/15/2018   Tobacco abuse 04/04/2018   DM type 2 with diabetic mixed hyperlipidemia (HCC) 03/08/2018   Low serum vitamin D 09/09/2017   History of Barrett's esophagus    Esophagitis    PAD (peripheral artery disease) (HCC) 10/15/2016   Carotid stenosis 10/15/2016   Pain in toe 09/17/2016   Renal cyst, acquired, left 09/13/2016   Medicare annual wellness visit, initial 09/03/2016   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) 08/29/2014   Combined fat and carbohydrate induced hyperlipemia 08/29/2014   Incomplete emptying of bladder 07/24/2014   Increased frequency of urination 03/19/2014   Lower abdominal pain 03/02/2014   Microscopic hematuria 03/01/2014   Nocturia 03/01/2014   Urinary tract infection, site not specified 03/01/2014   Diarrhea 03/05/2013   COLONIC POLYPS, ADENOMATOUS 09/05/2007   Hyperlipidemia 09/05/2007   Iron deficiency anemia 09/05/2007   BARRETTS ESOPHAGUS 09/05/2007   Diverticulosis of colon 09/05/2007   IRRITABLE BOWEL SYNDROME 09/05/2007   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    Orientation RESPIRATION BLADDER Height & Weight     Self, Time, Place  Normal Continent Weight: 121 lb 11.1 oz (55.2 kg) Height:  5' (152.4 cm)  BEHAVIORAL SYMPTOMS/MOOD NEUROLOGICAL BOWEL NUTRITION STATUS      Continent Diet (heart healthy/ carb modified, thin liquids)  AMBULATORY STATUS COMMUNICATION OF NEEDS Skin   Extensive Assist Verbally Wound Vac (left and right groin)                       Personal Care Assistance Level of Assistance  Bathing, Feeding, Dressing Bathing Assistance: Maximum assistance Feeding assistance: Limited assistance Dressing Assistance: Maximum assistance     Functional Limitations Info  Sight, Hearing, Speech Sight Info: Adequate Hearing Info: Adequate Speech Info: Adequate    SPECIAL CARE FACTORS FREQUENCY  PT (By licensed PT), OT (By licensed OT)     PT Frequency: 5x OT Frequency: 5x            Contractures Contractures Info: Not present  Additional Factors Info  Allergies, Code Status Code Status Info: full code Allergies Info: Codeine  Hydrocodone  Hydrocodone-acetaminophen  Lidocaine  Nsaids  Oxycodone           Current Medications (08/27/2023):  This is the current hospital active medication list Current Facility-Administered Medications  Medication Dose Route Frequency  Provider Last Rate Last Admin   0.9 %  sodium chloride infusion   Intravenous Continuous Wyn Quaker, Marlow Baars, MD   Stopped at 08/25/23 0915   acetaminophen (TYLENOL) tablet 1,000 mg  1,000 mg Oral Q6H PRN Annice Needy, MD   1,000 mg at 08/26/23 1934   alum & mag hydroxide-simeth (MAALOX/MYLANTA) 200-200-20 MG/5ML suspension 15-30 mL  15-30 mL Oral Q2H PRN Annice Needy, MD       ascorbic acid (VITAMIN C) tablet 1,000 mg  1,000 mg Oral Daily Annice Needy, MD   1,000 mg at 08/27/23 0941   aspirin EC tablet 81 mg  81 mg Oral Daily Annice Needy, MD   81 mg at 08/27/23 9629   Chlorhexidine Gluconate Cloth 2 % PADS 6 each  6 each Topical Daily Annice Needy, MD   6 each at 08/27/23 0914   cholecalciferol (VITAMIN D3) 25 MCG (1000 UNIT) tablet 5,000 Units  5,000 Units Oral Daily Annice Needy, MD   5,000 Units at 08/27/23 0942   clopidogrel (PLAVIX) tablet 75 mg  75 mg Oral Q0600 Annice Needy, MD   75 mg at 08/27/23 5284   cyanocobalamin (VITAMIN B12) tablet 1,000 mcg  1,000 mcg Oral Daily Annice Needy, MD   1,000 mcg at 08/27/23 1324   docusate sodium (COLACE) capsule 100 mg  100 mg Oral Daily Annice Needy, MD   100 mg at 08/27/23 0942   DOPamine (INTROPIN) 800 mg in dextrose 5 % 250 mL (3.2 mg/mL) infusion  3-5 mcg/kg/min Intravenous Continuous Annice Needy, MD       enoxaparin (LOVENOX) injection 40 mg  40 mg Subcutaneous Q24H Annice Needy, MD   40 mg at 08/27/23 0941   ferrous gluconate (FERGON) tablet 324 mg  324 mg Oral Daily Annice Needy, MD   324 mg at 08/27/23 0942   fluticasone (FLONASE) 50 MCG/ACT nasal spray 1 spray  1 spray Each Nare Daily PRN Annice Needy, MD       folic acid (FOLVITE) tablet 1 mg  1 mg Oral Daily Pace, Brien R, NP   1 mg at 08/27/23 0942   guaiFENesin-dextromethorphan (ROBITUSSIN DM) 100-10 MG/5ML syrup 15 mL  15 mL Oral Q4H PRN Annice Needy, MD       haloperidol lactate (HALDOL) injection 5 mg  5 mg Intravenous Once Annice Needy, MD       hydrALAZINE (APRESOLINE) injection 5 mg  5  mg Intravenous Q20 Min PRN Annice Needy, MD       insulin aspart (novoLOG) injection 2-6 Units  2-6 Units Subcutaneous TID AC & HS Annice Needy, MD   2 Units at 08/26/23 2232   labetalol (NORMODYNE) injection 10 mg  10 mg Intravenous Q10 min PRN Annice Needy, MD       loperamide (IMODIUM) capsule 2 mg  2 mg Oral Daily Annice Needy, MD   2 mg at 08/27/23 0948   LORazepam (ATIVAN) tablet 1-4 mg  1-4 mg Oral Q1H PRN Rolla Plate R, NP   1 mg at 08/26/23 2001   Or   LORazepam (  ATIVAN) injection 1-4 mg  1-4 mg Intravenous Q1H PRN Pace, Brien R, NP       magnesium sulfate IVPB 2 g 50 mL  2 g Intravenous Daily PRN Annice Needy, MD       metoprolol succinate (TOPROL-XL) 24 hr tablet 25 mg  25 mg Oral Daily Annice Needy, MD   25 mg at 08/27/23 2956   metoprolol tartrate (LOPRESSOR) injection 2-5 mg  2-5 mg Intravenous Q2H PRN Annice Needy, MD       morphine (PF) 2 MG/ML injection 2-5 mg  2-5 mg Intravenous Q1H PRN Annice Needy, MD   2 mg at 08/24/23 2231   multivitamin with minerals tablet 1 tablet  1 tablet Oral Daily Pace, Huel Cote R, NP   1 tablet at 08/27/23 0942   nitroGLYCERIN 50 mg in dextrose 5 % 250 mL (0.2 mg/mL) infusion  5-250 mcg/min Intravenous Titrated Annice Needy, MD       ondansetron [facility]) injection 4 mg  4 mg Intravenous Q6H PRN Annice Needy, MD   4 mg at 08/24/23 2017   Oral care mouth rinse  15 mL Mouth Rinse PRN Annice Needy, MD       pantoprazole (PROTONIX) EC tablet 40 mg  40 mg Oral Daily Annice Needy, MD   40 mg at 08/27/23 0941   phenol (CHLORASEPTIC) mouth spray 1 spray  1 spray Mouth/Throat PRN Annice Needy, MD       potassium chloride SA (KLOR-CON M) CR tablet 20-40 mEq  20-40 mEq Oral Daily PRN Annice Needy, MD       rosuvastatin (CRESTOR) tablet 20 mg  20 mg Oral Daily Annice Needy, MD   20 mg at 08/27/23 2130   senna-docusate (Senokot-S) tablet 1 tablet  1 tablet Oral QHS PRN Annice Needy, MD       sorbitol 70 % solution 30 mL  30 mL Oral Daily PRN Annice Needy, MD        thiamine (VITAMIN B1) tablet 100 mg  100 mg Oral Daily Pace, Brien R, NP   100 mg at 08/27/23 0941   traMADol (ULTRAM) tablet 50 mg  50 mg Oral Q6H PRN Annice Needy, MD   50 mg at 08/26/23 1307   venlafaxine (EFFEXOR) tablet 25 mg  25 mg Oral BID Annice Needy, MD   25 mg at 08/27/23 8657     Discharge Medications: Please see discharge summary for a list of discharge medications.  Relevant Imaging Results:  Relevant Lab Results:   Additional Information SSN:[id number]  Maree Krabbe, LCSW

## 2023-08-27 NOTE — Progress Notes (Signed)
3 Days Post-Op   Subjective/Chief Complaint: Less confusion. More alert this morning. Up in chair. States she feels a bit confused, mild pain in bilateral groins. Otherwise without complaint.   Objective: Vital signs in last 24 hours: Temp:  [97.7 F (36.5 C)-99.1 F (37.3 C)] 98.4 F (36.9 C) (01/18 0600) Pulse Rate:  [54-86] 60 (01/18 0600) Resp:  [16-24] 19 (01/18 0600) BP: (101-139)/(48-73) 109/53 (01/18 0600) SpO2:  [96 %-100 %] 96 % (01/18 0600) Last BM Date : 08/25/23  Intake/Output from previous day: 01/17 0701 - 01/18 0700 In: 120 [P.O.:120] Out: 0  Intake/Output this shift: Total I/O In: 240 [P.O.:240] Out: 0   General appearance: alert and no distress Cardio: regular rate and rhythm Extremities: Bilateral Prevena in place with good seal, no drainage, no erythema, soft, appropriately tender, legs/feet warm, +DP doppler  Lab Results:  Recent Labs    08/24/23 1514 08/25/23 0021 08/25/23 1233  WBC 12.5* 11.5*  --   HGB 8.2* 7.7* 7.9*  HCT 26.4* 25.3* 25.9*  PLT 261 248  --    BMET Recent Labs    08/24/23 1514 08/25/23 0021  NA  --  133*  K  --  3.8  CL  --  105  CO2  --  20*  GLUCOSE  --  117*  BUN  --  11  CREATININE 0.67 0.68  CALCIUM  --  7.4*   PT/INR No results for input(s): "LABPROT", "INR" in the last 72 hours. ABG No results for input(s): "PHART", "HCO3" in the last 72 hours.  Invalid input(s): "PCO2", "PO2"  Studies/Results: DG Chest Port 1 View Result Date: 08/26/2023 CLINICAL DATA:  Hypoxemia EXAM: PORTABLE CHEST - 1 VIEW COMPARISON:  08/25/2023 FINDINGS: Lungs are clear. Heart size and mediastinal contours are within normal limits. Aortic Atherosclerosis (ICD10-170.0). No effusion. Visualized bones unremarkable. IMPRESSION: No acute cardiopulmonary disease. Electronically Signed   By: Corlis Leak M.D.   On: 08/26/2023 12:19   DG Chest Port 1 View Result Date: 08/25/2023 CLINICAL DATA:  Dyspnea EXAM: PORTABLE CHEST 1 VIEW  COMPARISON:  05/15/2018, 11/18/2022 FINDINGS: The heart size and mediastinal contours are within normal limits. Aortic atherosclerosis. No focal airspace consolidation, pleural effusion, or pneumothorax. The visualized skeletal structures are unremarkable. IMPRESSION: No active disease. Electronically Signed   By: Duanne Guess D.O.   On: 08/25/2023 13:29    Anti-infectives: Anti-infectives (From admission, onward)    Start     Dose/Rate Route Frequency Ordered Stop   08/24/23 2200  ceFAZolin (ANCEF) IVPB 2g/100 mL premix        2 g 200 mL/hr over 30 Minutes Intravenous Every 8 hours 08/24/23 1424 08/25/23 0711   08/24/23 1214  gentamicin (GARAMYCIN) injection  Status:  Discontinued          As needed 08/24/23 1214 08/24/23 1301   08/24/23 1214  vancomycin (VANCOCIN) powder  Status:  Discontinued          As needed 08/24/23 1215 08/24/23 1301   08/24/23 0600  ceFAZolin (ANCEF) IVPB 2g/100 mL premix        2 g 200 mL/hr over 30 Minutes Intravenous On call to O.R. 08/23/23 2156 08/24/23 1145       Assessment/Plan: s/p Procedure(s): ENDARTERECTOMY FEMORAL (Bilateral) APPLICATION OF CELL SAVER (N/A) INSERTION OF ILIAC STENT (Bilateral) ABDOMINAL AORTIC ENDOVASCULAR STENT GRAFT (N/A) POD #3  Less confusion this morning, however, will continue to monitor closely in ICU; possible transfer to floor later. Continue sitter for now OOB to  chair/ambulate with PT ASA/Plavix   LOS: 3 days    Eli Hose A 08/27/2023

## 2023-08-28 LAB — COMPREHENSIVE METABOLIC PANEL
ALT: 10 U/L (ref 0–44)
AST: 20 U/L (ref 15–41)
Albumin: 3 g/dL — ABNORMAL LOW (ref 3.5–5.0)
Alkaline Phosphatase: 49 U/L (ref 38–126)
Anion gap: 11 (ref 5–15)
BUN: 14 mg/dL (ref 8–23)
CO2: 23 mmol/L (ref 22–32)
Calcium: 8.3 mg/dL — ABNORMAL LOW (ref 8.9–10.3)
Chloride: 99 mmol/L (ref 98–111)
Creatinine, Ser: 0.58 mg/dL (ref 0.44–1.00)
GFR, Estimated: >60 mL/min (ref >60)
Glucose, Bld: 141 mg/dL — ABNORMAL HIGH (ref 70–99)
Potassium: 3.4 mmol/L — ABNORMAL LOW (ref 3.5–5.1)
Sodium: 133 mmol/L — ABNORMAL LOW (ref 135–145)
Total Bilirubin: 0.7 mg/dL (ref 0.0–1.2)
Total Protein: 5.4 g/dL — ABNORMAL LOW (ref 6.5–8.1)

## 2023-08-28 LAB — CBC
HCT: 24.1 % — ABNORMAL LOW (ref 36.0–46.0)
Hemoglobin: 7.8 g/dL — ABNORMAL LOW (ref 12.0–15.0)
MCH: 30.8 pg (ref 26.0–34.0)
MCHC: 32.4 g/dL (ref 30.0–36.0)
MCV: 95.3 fL (ref 80.0–100.0)
Platelets: 281 10*3/uL (ref 150–400)
RBC: 2.53 MIL/uL — ABNORMAL LOW (ref 3.87–5.11)
RDW: 20.5 % — ABNORMAL HIGH (ref 11.5–15.5)
WBC: 8.2 10*3/uL (ref 4.0–10.5)
nRBC: 0 % (ref 0.0–0.2)

## 2023-08-28 LAB — GLUCOSE, CAPILLARY
Glucose-Capillary: 128 mg/dL — ABNORMAL HIGH (ref 70–99)
Glucose-Capillary: 151 mg/dL — ABNORMAL HIGH (ref 70–99)
Glucose-Capillary: 120 mg/dL — ABNORMAL HIGH (ref 70–99)
Glucose-Capillary: 119 mg/dL — ABNORMAL HIGH (ref 70–99)

## 2023-08-28 MED ORDER — SODIUM CHLORIDE 0.9% FLUSH: 3.0000 mL | INTRAVENOUS | Status: DC | PRN

## 2023-08-28 MED ORDER — SODIUM CHLORIDE 0.9% FLUSH
3.0000 mL | Freq: Two times a day (BID) | INTRAVENOUS | Status: DC
  Administered 2023-08-28: 3 mL via INTRAVENOUS
  Administered 2023-08-28 – 2023-08-29 (×2): 10 mL via INTRAVENOUS
  Administered 2023-08-29 – 2023-08-30 (×2): 3 mL via INTRAVENOUS

## 2023-08-28 MED ORDER — DIAZEPAM 5 MG PO TABS
5.0000 mg | ORAL_TABLET | Freq: Two times a day (BID) | ORAL | Status: DC | PRN
  Administered 2023-08-28 – 2023-09-02 (×5): 5 mg via ORAL
  Filled 2023-08-28 (×6): qty 1

## 2023-08-28 NOTE — Progress Notes (Signed)
4 Days Post-Op   Subjective/Chief Complaint: Transferred to floor yesterday. Continued sundowning with agitation. Awake and alert this morning. No agitation. States she walked today. Denies pain.States her feet feel better.   Objective: Vital signs in last 24 hours: Temp:  [97.7 F (36.5 C)-98.7 F (37.1 C)] 98.7 F (37.1 C) (01/19 0739) Pulse Rate:  [53-100] 53 (01/19 0739) Resp:  [16-18] 16 (01/19 0739) BP: (100-139)/(57-87) 139/62 (01/19 0739) SpO2:  [95 %-100 %] 99 % (01/19 0739) Last BM Date : 08/27/23  Intake/Output from previous day: 01/18 0701 - 01/19 0700 In: 240 [P.O.:240] Out: 0  Intake/Output this shift: No intake/output data recorded.  General appearance: alert and no distress Cardio: regular rate and rhythm Extremities: Bilateral- warm, Prevena in place, soft, no erythema, feet warm, +DP  Lab Results:  Recent Labs    08/25/23 1233  HGB 7.9*  HCT 25.9*   BMET No results for input(s): "NA", "K", "CL", "CO2", "GLUCOSE", "BUN", "CREATININE", "CALCIUM" in the last 72 hours. PT/INR No results for input(s): "LABPROT", "INR" in the last 72 hours. ABG No results for input(s): "PHART", "HCO3" in the last 72 hours.  Invalid input(s): "PCO2", "PO2"  Studies/Results: No results found.  Anti-infectives: Anti-infectives (From admission, onward)    Start     Dose/Rate Route Frequency Ordered Stop   08/24/23 2200  ceFAZolin (ANCEF) IVPB 2g/100 mL premix        2 g 200 mL/hr over 30 Minutes Intravenous Every 8 hours 08/24/23 1424 08/25/23 0711   08/24/23 1214  gentamicin (GARAMYCIN) injection  Status:  Discontinued          As needed 08/24/23 1214 08/24/23 1301   08/24/23 1214  vancomycin (VANCOCIN) powder  Status:  Discontinued          As needed 08/24/23 1215 08/24/23 1301   08/24/23 0600  ceFAZolin (ANCEF) IVPB 2g/100 mL premix        2 g 200 mL/hr over 30 Minutes Intravenous On call to O.R. 08/23/23 2156 08/24/23 1145       Assessment/Plan: s/p  Procedure(s): ENDARTERECTOMY FEMORAL (Bilateral) APPLICATION OF CELL SAVER (N/A) INSERTION OF ILIAC STENT (Bilateral) ABDOMINAL AORTIC ENDOVASCULAR STENT GRAFT (N/A)  POD #4 Will check labs today- CBC, CMP Continue sitter/safety, prn evening Valium OOB to chair/ambulate with PT Plan for disposition Monday/Tuesday ASA/Plavix  LOS: 4 days    Eli Hose A 08/28/2023

## 2023-08-28 NOTE — Plan of Care (Addendum)
Patient alert to self,pt confused over the shift,patient trying to get up from bed stating she wants to go home.Safety sitter remained at bedside. Problem: Education: Goal: Knowledge of General Education information will improve Description: Including pain rating scale, medication(s)/side effects and non-pharmacologic comfort measures Outcome: Progressing   Problem: Health Behavior/Discharge Planning: Goal: Ability to manage health-related needs will improve Outcome: Progressing   Problem: Clinical Measurements: Goal: Ability to maintain clinical measurements within normal limits will improve Outcome: Progressing Goal: Will remain free from infection Outcome: Progressing Goal: Diagnostic test results will improve Outcome: Progressing Goal: Respiratory complications will improve Outcome: Progressing Goal: Cardiovascular complication will be avoided Outcome: Progressing

## 2023-08-28 NOTE — Plan of Care (Signed)
  Problem: Education: Goal: Knowledge of General Education information will improve Description: Including pain rating scale, medication(s)/side effects and non-pharmacologic comfort measures Outcome: Progressing   Problem: Health Behavior/Discharge Planning: Goal: Ability to manage health-related needs will improve Outcome: Progressing   Problem: Clinical Measurements: Goal: Ability to maintain clinical measurements within normal limits will improve Outcome: Progressing Goal: Will remain free from infection Outcome: Progressing Goal: Diagnostic test results will improve Outcome: Progressing Goal: Respiratory complications will improve Outcome: Progressing Goal: Cardiovascular complication will be avoided Outcome: Progressing   Problem: Activity: Goal: Risk for activity intolerance will decrease Outcome: Progressing   Problem: Nutrition: Goal: Adequate nutrition will be maintained Outcome: Progressing   Problem: Coping: Goal: Level of anxiety will decrease Outcome: Progressing   Problem: Safety: Goal: Ability to remain free from injury will improve Outcome: Progressing   

## 2023-08-29 LAB — GLUCOSE, CAPILLARY
Glucose-Capillary: 99 mg/dL (ref 70–99)
Glucose-Capillary: 119 mg/dL — ABNORMAL HIGH (ref 70–99)
Glucose-Capillary: 121 mg/dL — ABNORMAL HIGH (ref 70–99)
Glucose-Capillary: 86 mg/dL (ref 70–99)

## 2023-08-29 NOTE — Progress Notes (Signed)
  Progress Note    08/29/2023 3:23 PM 5 Days Post-Op  Subjective:  Now on the floor. Continued sun downing with agitation. Awake and alert this morning without agitation. She was walking the halls this morning with walker. She denies any pain.    Vitals:   08/29/23 0509 08/29/23 0806  BP: 115/69 131/64  Pulse: 68 (!) 52  Resp: 20 16  Temp: 98.9 F (37.2 C) 98.9 F (37.2 C)  SpO2: 96% 100%   Physical Exam: Cardiac:  RRR, No murmurs. Lungs:  Clear on auscultation throughout, no wheezing rales or rhonchi Incisions:  Bilateral groins with prevena Wound Vac Extremities:  Bilateral lower extremities warm to touch. Palpable pulses Abdomen:  Positive bowel sounds  Neurologic: AAOX2 this morning. Appears slightly confused and agitated.   CBC    Component Value Date/Time   WBC 8.2 08/28/2023 1221   RBC 2.53 (L) 08/28/2023 1221   HGB 7.8 (L) 08/28/2023 1221   HGB 10.7 (L) 08/15/2023 1341   HCT 24.1 (L) 08/28/2023 1221   HCT 35.8 08/15/2023 1341   PLT 281 08/28/2023 1221   PLT 513 (H) 08/15/2023 1341   MCV 95.3 08/28/2023 1221   MCV 98 (H) 08/15/2023 1341   MCV 87 07/12/2014 2035   MCH 30.8 08/28/2023 1221   MCHC 32.4 08/28/2023 1221   RDW 20.5 (H) 08/28/2023 1221   RDW 16.9 (H) 08/15/2023 1341   RDW 18.1 (H) 07/12/2014 2035   LYMPHSABS 0.4 (L) 08/17/2023 1050   LYMPHSABS 0.8 08/15/2023 1341   MONOABS 0.6 08/17/2023 1050   EOSABS 0.0 08/17/2023 1050   EOSABS 0.1 08/15/2023 1341   BASOSABS 0.1 08/17/2023 1050   BASOSABS 0.1 08/15/2023 1341    BMET    Component Value Date/Time   NA 133 (L) 08/28/2023 1221   NA 142 07/12/2014 2035   K 3.4 (L) 08/28/2023 1221   K 3.4 (L) 07/12/2014 2035   CL 99 08/28/2023 1221   CL 107 07/12/2014 2035   CO2 23 08/28/2023 1221   CO2 30 07/12/2014 2035   GLUCOSE 141 (H) 08/28/2023 1221   GLUCOSE 133 (H) 07/12/2014 2035   BUN 14 08/28/2023 1221   BUN 11 07/12/2014 2035   CREATININE 0.58 08/28/2023 1221   CREATININE 0.73 07/12/2014  2035   CALCIUM 8.3 (L) 08/28/2023 1221   CALCIUM 8.3 (L) 07/12/2014 2035   GFRNONAA >60 08/28/2023 1221   GFRNONAA >60 07/12/2014 2035   GFRAA >60 05/18/2018 0515   GFRAA >60 07/12/2014 2035    INR    Component Value Date/Time   INR 1.2 12/12/2021 2350     Intake/Output Summary (Last 24 hours) at 08/29/2023 1523 Last data filed at 08/29/2023 1500 Gross per 24 hour  Intake --  Output 1 ml  Net -1 ml     Assessment/Plan:  78 y.o. female is s/p Bilateral femoral Endarterectomies with Iliac stent placement and abdominal aortic endovascular stent placement.  5 Days Post-Op   PLAN: Continue sitter/safety, prn evening Valium OOB to chair/ambulate with PT Plan for disposition Monday/Tuesday with Rehab/SNF placement. ASA/Plavix  DVT prophylaxis:  Lovenox 40 mg Q24   Jarica Plass R Ismael [facility] 08/29/2023 3:23 PM

## 2023-08-29 NOTE — TOC Progression Note (Signed)
Transition of Care [facility]) - Progression Note    Patient Details  Name: Yolanda Jimenez MRN: [id number] Date of Birth: 1946/01/20  [facility] CM/SW Contact  Garret Reddish, RN Phone Number: 08/29/2023, 11:17 AM  Clinical Narrative:    Chart reviewed.  Noted that family is agreeable to SNF.  Currently no bed offers at this time.  TOC sent bed search out again to [facility] area facilities.  Will continue to follow for discharge planning.     Expected Discharge Plan: Skilled Nursing Facility Barriers to Discharge: Continued Medical Work up  Expected Discharge Plan and Services In-house Referral: Clinical Social Work   Post Acute Care Choice: Skilled Nursing Facility Living arrangements for the past 2 months: Single Family Home                                       Social Determinants of Health (SDOH) Interventions SDOH Screenings   Food Insecurity: No Food Insecurity (08/27/2023)  Housing: Low Risk  (08/27/2023)  Transportation Needs: No Transportation Needs (08/27/2023)  Utilities: Not At Risk (08/27/2023)  Depression (PHQ2-9): Low Risk  (12/23/2022)  Financial Resource Strain: Low Risk  (05/17/2023)   Received from [facility]  Social Connections: Unknown (08/27/2023)  Tobacco Use: High Risk (08/24/2023)    Readmission Risk Interventions     No data to display

## 2023-08-29 NOTE — Plan of Care (Signed)
  Problem: Education: Goal: Knowledge of General Education information will improve Description: Including pain rating scale, medication(s)/side effects and non-pharmacologic comfort measures Outcome: Progressing   Problem: Health Behavior/Discharge Planning: Goal: Ability to manage health-related needs will improve Outcome: Progressing   Problem: Clinical Measurements: Goal: Ability to maintain clinical measurements within normal limits will improve Outcome: Progressing Goal: Will remain free from infection Outcome: Progressing Goal: Diagnostic test results will improve Outcome: Progressing Goal: Respiratory complications will improve Outcome: Progressing Goal: Cardiovascular complication will be avoided Outcome: Progressing   Problem: Nutrition: Goal: Adequate nutrition will be maintained Outcome: Progressing   Problem: Activity: Goal: Risk for activity intolerance will decrease Outcome: Progressing   Problem: Elimination: Goal: Will not experience complications related to bowel motility Outcome: Progressing Goal: Will not experience complications related to urinary retention Outcome: Progressing   Problem: Pain Managment: Goal: General experience of comfort will improve and/or be controlled Outcome: Progressing   Problem: Safety: Goal: Ability to remain free from injury will improve Outcome: Progressing   Problem: Education: Goal: Knowledge of prescribed regimen will improve Outcome: Progressing   Problem: Bowel/Gastric: Goal: Gastrointestinal status for postoperative course will improve Outcome: Progressing   Problem: Activity: Goal: Ability to tolerate increased activity will improve Outcome: Progressing

## 2023-08-30 LAB — GLUCOSE, CAPILLARY
Glucose-Capillary: 152 mg/dL — ABNORMAL HIGH (ref 70–99)
Glucose-Capillary: 66 mg/dL — ABNORMAL LOW (ref 70–99)
Glucose-Capillary: 118 mg/dL — ABNORMAL HIGH (ref 70–99)

## 2023-08-30 NOTE — Progress Notes (Signed)
Occupational Therapy Treatment Patient Details Name: Yolanda Jimenez MRN: [id number] DOB: 1945/10/10 Today's Date: 08/30/2023   History of present illness Pt is a 78 y/o F admitted on 08/24/23 for scheduled bilateral femoral endarterectomy. PMH: a-fib, asthma, AVM, COPD, depression, DM, heart murmur, HLD, IBS, PVD   OT comments  Pt seen for tx session this date, focusing on functional mobility, toileting tasks and UB/LB bathing + dressing. Pt still with decreased orientation and poor STM, but able to tell this There[place] Parkin where she is and why. Pt making progress towards goals, completing functional mobility t/f  bathroom with RW and CGA, transfers to toilet with CGA, toilting tasks CGA and stands at sink to wash hands. Sits in the recliner and OT provides setup for UB/LB bathing, CGA for safety to don underwear over hips. Pt left in recliner, needs within reach and RN in room. OT will continue to follow for functional gains. Discharge recommendation updated. Patient will benefit from continued inpatient follow up therapy, <3 hours/day.       If plan is discharge home, recommend the following:  A little help with bathing/dressing/bathroom;Supervision due to cognitive status;Help with stairs or ramp for entrance   Equipment Recommendations  BSC/3in1       Precautions / Restrictions Precautions Precautions: Fall Restrictions Weight Bearing Restrictions Per Provider Order: No       Mobility Bed Mobility               General bed mobility comments: in recliner start and end of session    Transfers Overall transfer level: Needs assistance Equipment used: Rolling walker (2 wheels), None Transfers: Sit to/from Stand, Bed to chair/wheelchair/BSC Sit to Stand: Supervision     Step pivot transfers: Contact guard assist           Balance Overall balance assessment: Needs assistance Sitting-balance support: Feet supported Sitting balance-Leahy Scale: Good     Standing balance  support: During functional activity, Reliant on assistive device for balance, Single extremity supported Standing balance-Leahy Scale: Fair Standing balance comment: CGA standing at sink to wash hands, no LOB                           ADL either performed or assessed with clinical judgement   ADL Overall ADL's : Needs assistance/impaired     Grooming: Wash/dry hands;Standing;Contact guard assist Grooming Details (indicate cue type and reason): sink level Upper Body Bathing: Set up;Sitting   Lower Body Bathing: Set up;Sit to/from stand   Upper Body Dressing : Set up;Sitting Upper Body Dressing Details (indicate cue type and reason): pt able to problem-solve donning gown from folded position Lower Body Dressing: Contact guard assist;Sit to/from stand Lower Body Dressing Details (indicate cue type and reason): CGA and cues for wound vac to don underwear Toilet Transfer: Contact guard assist;Rolling walker (2 wheels) Toilet Transfer Details (indicate cue type and reason): cues for safety Toileting- Clothing Manipulation and Hygiene: Contact guard assist;Sit to/from stand       Functional mobility during ADLs: Contact guard assist;Rolling walker (2 wheels)        Cognition Arousal: Alert Behavior During Therapy: WFL for tasks assessed/performed Overall Cognitive Status: Impaired/Different from baseline Area of Impairment: Orientation, Attention, Memory, Following commands, Safety/judgement, Awareness, Problem solving                 Orientation Level: Disoriented to, Place, Situation (recalls year not date, even with multiple attempts) Current Attention Level: Focused Memory:  Decreased short-term memory Following Commands: Follows one step commands inconsistently, Follows one step commands with increased time Safety/Judgement: Decreased awareness of safety, Decreased awareness of deficits Awareness: Intellectual Problem Solving: Slow processing, Decreased  initiation, Difficulty sequencing, Requires verbal cues, Requires tactile cues                General Comments wound vacs present and intact start/end of session    Pertinent Vitals/ Pain       Pain Assessment Pain Assessment: No/denies pain Pain Score: 0-No pain   Frequency  Min 1X/week        Progress Toward Goals  OT Goals(current goals can now be found in the care plan section)  Progress towards OT goals: Progressing toward goals  Acute Rehab OT Goals OT Goal Formulation: With patient/family Time For Goal Achievement: 09/08/23 Potential to Achieve Goals: Good ADL Goals Pt Will Perform Grooming: with modified independence;standing Pt Will Perform Lower Body Dressing: with modified independence;sit to/from stand Pt Will Transfer to Toilet: with modified independence;ambulating;regular height toilet  Plan         AM-PAC OT "6 Clicks" Daily Activity     Outcome Measure   Help from another person eating meals?: None Help from another person taking care of personal grooming?: None Help from another person toileting, which includes using toliet, bedpan, or urinal?: A Little Help from another person bathing (including washing, rinsing, drying)?: A Little Help from another person to put on and taking off regular upper body clothing?: None Help from another person to put on and taking off regular lower body clothing?: A Little 6 Click Score: 21    End of Session Equipment Utilized During Treatment: Gait belt;Rolling walker (2 wheels)  OT Visit Diagnosis: Other abnormalities of gait and mobility (R26.89);Muscle weakness (generalized) (M62.81)   Activity Tolerance Patient tolerated treatment well   Patient Left in chair;with call bell/phone within reach;with chair alarm set;with family/visitor present;Other (comment) (tele on)   Nurse Communication Mobility status        Time: 1610-9604 OT Time Calculation (min): 32 min  Charges: OT General Charges $OT  Visit: 1 Visit OT Treatments $Self Care/Home Management : 23-37 mins  Jaquarious Grey L. Rossie Bretado, OTR/L  08/30/23, 3:17 PM

## 2023-08-30 NOTE — Progress Notes (Signed)
  Progress Note    08/30/2023 10:27 AM 6 Days Post-Op  Subjective:  Now on the floor. Continued sun downing with agitation. Awake and alert this morning without agitation. She was walking the halls this morning with walker. She denies any pain.    Vitals:   08/29/23 2010 08/30/23 0418  BP: 122/72 (!) 124/59  Pulse: 67 63  Resp: 20 16  Temp: 98.9 F (37.2 C) 98.7 F (37.1 C)  SpO2: 95% 97%   Physical Exam: Cardiac:  RRR, No murmurs. Lungs:  Clear on auscultation throughout, no wheezing rales or rhonchi Incisions:  Bilateral groins with prevena Wound Vac Extremities:  Bilateral lower extremities warm to touch. Palpable pulses Abdomen:  Positive bowel sounds  Neurologic: AAOX2 this morning. Appears slightly confused and agitated.   CBC    Component Value Date/Time   WBC 8.2 08/28/2023 1221   RBC 2.53 (L) 08/28/2023 1221   HGB 7.8 (L) 08/28/2023 1221   HGB 10.7 (L) 08/15/2023 1341   HCT 24.1 (L) 08/28/2023 1221   HCT 35.8 08/15/2023 1341   PLT 281 08/28/2023 1221   PLT 513 (H) 08/15/2023 1341   MCV 95.3 08/28/2023 1221   MCV 98 (H) 08/15/2023 1341   MCV 87 07/12/2014 2035   MCH 30.8 08/28/2023 1221   MCHC 32.4 08/28/2023 1221   RDW 20.5 (H) 08/28/2023 1221   RDW 16.9 (H) 08/15/2023 1341   RDW 18.1 (H) 07/12/2014 2035   LYMPHSABS 0.4 (L) 08/17/2023 1050   LYMPHSABS 0.8 08/15/2023 1341   MONOABS 0.6 08/17/2023 1050   EOSABS 0.0 08/17/2023 1050   EOSABS 0.1 08/15/2023 1341   BASOSABS 0.1 08/17/2023 1050   BASOSABS 0.1 08/15/2023 1341    BMET    Component Value Date/Time   NA 133 (L) 08/28/2023 1221   NA 142 07/12/2014 2035   K 3.4 (L) 08/28/2023 1221   K 3.4 (L) 07/12/2014 2035   CL 99 08/28/2023 1221   CL 107 07/12/2014 2035   CO2 23 08/28/2023 1221   CO2 30 07/12/2014 2035   GLUCOSE 141 (H) 08/28/2023 1221   GLUCOSE 133 (H) 07/12/2014 2035   BUN 14 08/28/2023 1221   BUN 11 07/12/2014 2035   CREATININE 0.58 08/28/2023 1221   CREATININE 0.73 07/12/2014  2035   CALCIUM 8.3 (L) 08/28/2023 1221   CALCIUM 8.3 (L) 07/12/2014 2035   GFRNONAA >60 08/28/2023 1221   GFRNONAA >60 07/12/2014 2035   GFRAA >60 05/18/2018 0515   GFRAA >60 07/12/2014 2035    INR    Component Value Date/Time   INR 1.2 12/12/2021 2350     Intake/Output Summary (Last 24 hours) at 08/30/2023 1027 Last data filed at 08/30/2023 0200 Gross per 24 hour  Intake --  Output 2 ml  Net -2 ml     Assessment/Plan:  78 y.o. female is s/p Bilateral femoral Endarterectomies with Iliac stent placement and abdominal aortic endovascular stent placement.   6 Days Post-Op   PLAN: Continue sitter/safety, prn evening Valium OOB to chair/ambulate with PT Plan for disposition, this week with Rehab/SNF placement. ASA/Plavix   DVT prophylaxis:  Lovenox 40 mg Q24   Marcus Schwandt R Bastian Andreoli [facility] 08/30/2023 10:27 AM

## 2023-08-30 NOTE — Consult Note (Signed)
[facility] Liaison Note  08/30/2023  Yolanda Jimenez Jul 17, 1946 [id number]  Location: RN Hospital Liaison screened the patient remotely at [facility].  Insurance: [employer]   Yolanda Jimenez is a 78 y.o. female who is a Primary Care Patient of Danella Penton, MD -[facility]. The patient was screened for readmission hospitalization with noted medium risk score for unplanned readmission risk with 1 IP in 6 months.  The patient was assessed for potential Care Management service needs for post hospital transition for care coordination. Review of patient's electronic medical record reveals patient was admitted for Atherosclerosis. Pt recommended for SNF level of care. Currently awaiting bed offers for possible placement. The facility will continue to address pt's needs upon his discharge if pt receives a bed offer at a SNF. If pt is discharged home this provider office completes the transition of care post hospital follow up calls.    [facility] Care Management/Population Health does not replace or interfere with any arrangements made by the Inpatient Transition of Care team.   For questions contact:   Elliot Cousin, RN, [facility] Liaison [place]   [facility], Population Health Office Hours MTWF  8:00 am-6:00 pm Direct Dial: [phone] mobile [phone] [Office toll free line] Office Hours are M-F 8:30 - 5 pm [email or web address]

## 2023-08-30 NOTE — Progress Notes (Signed)
Physical Therapy Treatment Patient Details Name: Yolanda Jimenez MRN: [id number] DOB: 12-Dec-1945 Today's Date: 08/30/2023   History of Present Illness Pt is a 78 y/o F admitted on 08/24/23 for scheduled bilateral femoral endarterectomy. PMH: a-fib, asthma, AVM, COPD, depression, DM, heart murmur, HLD, IBS, PVD    PT Comments  Pt is progressing with functional standing balance, transfers, and gait with RW performing at [facility] to close supervision level.  Pt is progressing with activity tolerance ambulating  with RW 160' + 50' without RW, SPO2 98% HR 88 BPM.  Pt would like to be more mobile during stay on Acute and a referral for Mobility Specialist has been sent.  Continued PT will assist pt in gaining more LE strength, balance, and activity tolerance to increase safety with  functional mobility.   If plan is discharge home, recommend the following: Assist for transportation;Assistance with cooking/housework;Help with stairs or ramp for entrance;A lot of help with walking and/or transfers;A lot of help with bathing/dressing/bathroom;Direct supervision/assist for medications management;Direct supervision/assist for financial management;Supervision due to cognitive status   Can travel by private vehicle     Yes  Equipment Recommendations  None recommended by PT    Recommendations for Other Services       Precautions / Restrictions Precautions Precautions: Fall Restrictions Weight Bearing Restrictions Per Provider Order: No     Mobility  Bed Mobility Overal bed mobility: Modified Independent Bed Mobility: Supine to Sit     Supine to sit: HOB elevated, Modified independent (Device/Increase time)          Transfers Overall transfer level: Needs assistance Equipment used: Rolling walker (2 wheels), None Transfers: Sit to/from Stand, Bed to chair/wheelchair/BSC Sit to Stand: Supervision   Step pivot transfers: Supervision       General transfer comment: cues for safe hand  placement with walker    Ambulation/Gait Ambulation/Gait assistance: Supervision, Contact guard assist Gait Distance (Feet): 160 Feet Assistive device: Rolling walker (2 wheels), None Gait Pattern/deviations: Decreased stride length, Step-through pattern, WFL(Within Functional Limits) Gait velocity: decreased     General Gait Details: supervision with RW, CGA 50' without walker to stabilize.   Stairs             Wheelchair Mobility     Tilt Bed    Modified Rankin (Stroke Patients Only)       Balance Overall balance assessment: Needs assistance Sitting-balance support: Feet supported Sitting balance-Leahy Scale: Good Sitting balance - Comments: stable with low level dynamic sitting balance.   Standing balance support: During functional activity, Reliant on assistive device for balance, Single extremity supported Standing balance-Leahy Scale: Fair Standing balance comment: supervision with dynamic standing balance with 2-1 UE support, no LOB noted.                            Cognition Arousal: Alert Behavior During Therapy: WFL for tasks assessed/performed                             Safety/Judgement: Decreased awareness of safety, Decreased awareness of deficits              Exercises      General Comments        Pertinent Vitals/Pain Pain Assessment Pain Score: 2  Pain Location: LE groin Pain Descriptors / Indicators: Aching Pain Intervention(s): Monitored during session    Home Living  Prior Function            PT Goals (current goals can now be found in the care plan section) Acute Rehab PT Goals Patient Stated Goal: decreased pain, get better, go home PT Goal Formulation: With patient Time For Goal Achievement: 09/08/23 Potential to Achieve Goals: Fair Progress towards PT goals: Progressing toward goals    Frequency    Min 1X/week      PT Plan      Co-evaluation               AM-PAC PT "6 Clicks" Mobility   Outcome Measure  Help needed turning from your back to your side while in a flat bed without using bedrails?: A Little Help needed moving from lying on your back to sitting on the side of a flat bed without using bedrails?: A Little Help needed moving to and from a bed to a chair (including a wheelchair)?: A Little Help needed standing up from a chair using your arms (e.g., wheelchair or bedside chair)?: A Little Help needed to walk in hospital room?: A Little Help needed climbing 3-5 steps with a railing? : A Little 6 Click Score: 18    End of Session   Activity Tolerance: Patient tolerated treatment well;Patient limited by fatigue Patient left: in chair;with family/visitor present;with nursing/sitter in room Nurse Communication: Mobility status PT Visit Diagnosis: Muscle weakness (generalized) (M62.81);Pain;Other abnormalities of gait and mobility (R26.89);Difficulty in walking, not elsewhere classified (R26.2) Pain - Right/Left: Left Pain - part of body: Leg     Time: 8295-6213 PT Time Calculation (min) (ACUTE ONLY): 24 min  Charges:    $Gait Training: 8-22 mins $Therapeutic Activity: 8-22 mins PT General Charges $$ ACUTE PT VISIT: 1 Visit                     Hortencia Conradi, PTA  08/30/23, 10:38 AM

## 2023-08-30 NOTE — Plan of Care (Signed)
  Problem: Clinical Measurements: Goal: Ability to maintain clinical measurements within normal limits will improve Outcome: Progressing Goal: Will remain free from infection Outcome: Progressing Goal: Diagnostic test results will improve Outcome: Progressing Goal: Respiratory complications will improve Outcome: Progressing Goal: Cardiovascular complication will be avoided Outcome: Progressing   Problem: Nutrition: Goal: Adequate nutrition will be maintained Outcome: Progressing   Problem: Activity: Goal: Risk for activity intolerance will decrease Outcome: Progressing   Problem: Coping: Goal: Level of anxiety will decrease Outcome: Progressing   Problem: Elimination: Goal: Will not experience complications related to bowel motility Outcome: Progressing Goal: Will not experience complications related to urinary retention Outcome: Progressing   Problem: Safety: Goal: Ability to remain free from injury will improve Outcome: Progressing   Problem: Pain Managment: Goal: General experience of comfort will improve and/or be controlled Outcome: Progressing   Problem: Skin Integrity: Goal: Risk for impaired skin integrity will decrease Outcome: Progressing

## 2023-08-31 LAB — GLUCOSE, CAPILLARY
Glucose-Capillary: 124 mg/dL — ABNORMAL HIGH (ref 70–99)
Glucose-Capillary: 119 mg/dL — ABNORMAL HIGH (ref 70–99)
Glucose-Capillary: 105 mg/dL — ABNORMAL HIGH (ref 70–99)
Glucose-Capillary: 127 mg/dL — ABNORMAL HIGH (ref 70–99)

## 2023-08-31 LAB — CREATININE, SERUM
Creatinine, Ser: 0.66 mg/dL (ref 0.44–1.00)
GFR, Estimated: >60 mL/min (ref >60)

## 2023-08-31 NOTE — Progress Notes (Signed)
  Progress Note    08/31/2023 3:12 PM 7 Days Post-Op  Subjective:  Yolanda Jimenez is a 78 yo female now POD #7 from Bilateral femoral Endarterectomies with Iliac stent placement and abdominal aortic endovascular stent placement. Patient remains on the floor. Nightly sundowning getting better. Continues to walk the halls with assistance. Recovering as expected. No complaints overnight and vitals all remain stable.    Vitals:   08/31/23 0130 08/31/23 0813  BP: 122/81 128/82  Pulse: 61 65  Resp: 17 16  Temp: 98 F (36.7 C) 98 F (36.7 C)  SpO2: 99% 99%   Physical Exam: Cardiac:  RRR, No murmurs. Lungs:  Clear on auscultation throughout, no wheezing rales or rhonchi Incisions:  Bilateral groins with prevena Wound Vac Extremities:  Bilateral lower extremities warm to touch. Palpable pulses Abdomen:  Positive bowel sounds  Neurologic: AAOX2 this morning. Appears slightly confused and agitated.  CBC    Component Value Date/Time   WBC 8.2 08/28/2023 1221   RBC 2.53 (L) 08/28/2023 1221   HGB 7.8 (L) 08/28/2023 1221   HGB 10.7 (L) 08/15/2023 1341   HCT 24.1 (L) 08/28/2023 1221   HCT 35.8 08/15/2023 1341   PLT 281 08/28/2023 1221   PLT 513 (H) 08/15/2023 1341   MCV 95.3 08/28/2023 1221   MCV 98 (H) 08/15/2023 1341   MCV 87 07/12/2014 2035   MCH 30.8 08/28/2023 1221   MCHC 32.4 08/28/2023 1221   RDW 20.5 (H) 08/28/2023 1221   RDW 16.9 (H) 08/15/2023 1341   RDW 18.1 (H) 07/12/2014 2035   LYMPHSABS 0.4 (L) 08/17/2023 1050   LYMPHSABS 0.8 08/15/2023 1341   MONOABS 0.6 08/17/2023 1050   EOSABS 0.0 08/17/2023 1050   EOSABS 0.1 08/15/2023 1341   BASOSABS 0.1 08/17/2023 1050   BASOSABS 0.1 08/15/2023 1341    BMET    Component Value Date/Time   NA 133 (L) 08/28/2023 1221   NA 142 07/12/2014 2035   K 3.4 (L) 08/28/2023 1221   K 3.4 (L) 07/12/2014 2035   CL 99 08/28/2023 1221   CL 107 07/12/2014 2035   CO2 23 08/28/2023 1221   CO2 30 07/12/2014 2035   GLUCOSE 141 (H)  08/28/2023 1221   GLUCOSE 133 (H) 07/12/2014 2035   BUN 14 08/28/2023 1221   BUN 11 07/12/2014 2035   CREATININE 0.66 08/31/2023 0558   CREATININE 0.73 07/12/2014 2035   CALCIUM 8.3 (L) 08/28/2023 1221   CALCIUM 8.3 (L) 07/12/2014 2035   GFRNONAA >60 08/31/2023 0558   GFRNONAA >60 07/12/2014 2035   GFRAA >60 05/18/2018 0515   GFRAA >60 07/12/2014 2035    INR    Component Value Date/Time   INR 1.2 12/12/2021 2350     Intake/Output Summary (Last 24 hours) at 08/31/2023 1512 Last data filed at 08/31/2023 0410 Gross per 24 hour  Intake 240 ml  Output --  Net 240 ml     Assessment/Plan:  78 y.o. female is s/p  Bilateral femoral Endarterectomies with Iliac stent placement and abdominal aortic endovascular stent placement.  7 Days Post-Op   PLAN: Continue sitter/safety, prn evening Valium OOB to chair/ambulate with PT Plan for disposition, this week with Rehab/SNF placement. ASA/Plavix   DVT prophylaxis:  Lovenox 40 mg Q24   Jerlene Rockers R Jaleal Schliep [facility] 08/31/2023 3:12 PM

## 2023-08-31 NOTE — Inpatient Diabetes Management (Signed)
Inpatient Diabetes Program Recommendations  AACE/ADA: New Consensus Statement on Inpatient Glycemic Control   Target Ranges:  Prepandial:   less than 140 mg/dL      Peak postprandial:   less than 180 mg/dL (1-2 hours)      Critically ill patients:  140 - 180 mg/dL    Latest Reference Range & Units 08/29/23 08:09 08/29/23 11:37 08/29/23 16:41 08/29/23 21:28 08/30/23 14:15 08/30/23 16:58 08/30/23 21:18  Glucose-Capillary 70 - 99 mg/dL 99 295 (H) 284 (H) 86 132 (H) 66 (L) 118 (H)   Review of Glycemic Control  Diabetes history: DM2 Outpatient Diabetes medications: None; diet controlled Current orders for Inpatient glycemic control: Novolog 2-6 units AC&HS  Inpatient Diabetes Program Recommendations:    Insulin: Please discontinue ICU Novolog scale and use Glycemic Control Order set to order CBGs AC&HS and Novolog 0-6 units AC&HS.  Thanks, Orlando Penner, RN, MSN, CDCES Diabetes Coordinator Inpatient Diabetes Program [phone] (Team Pager from 8am to 5pm)

## 2023-08-31 NOTE — Plan of Care (Signed)
  Problem: Clinical Measurements: Goal: Ability to maintain clinical measurements within normal limits will improve Outcome: Progressing Goal: Will remain free from infection Outcome: Progressing Goal: Diagnostic test results will improve Outcome: Progressing Goal: Respiratory complications will improve Outcome: Progressing Goal: Cardiovascular complication will be avoided Outcome: Progressing   Problem: Activity: Goal: Risk for activity intolerance will decrease Outcome: Progressing   Problem: Pain Managment: Goal: General experience of comfort will improve and/or be controlled Outcome: Progressing   Problem: Safety: Goal: Ability to remain free from injury will improve Outcome: Progressing

## 2023-08-31 NOTE — Plan of Care (Signed)
  Problem: Education: Goal: Knowledge of General Education information will improve Description: Including pain rating scale, medication(s)/side effects and non-pharmacologic comfort measures Outcome: Not Progressing Note: Confused and forgetful   Problem: Health Behavior/Discharge Planning: Goal: Ability to manage health-related needs will improve Outcome: Not Progressing Note: Confused and forgetful   Problem: Clinical Measurements: Goal: Ability to maintain clinical measurements within normal limits will improve Outcome: Progressing Goal: Will remain free from infection Outcome: Progressing Goal: Diagnostic test results will improve Outcome: Progressing Goal: Respiratory complications will improve Outcome: Progressing Goal: Cardiovascular complication will be avoided Outcome: Progressing   Problem: Activity: Goal: Risk for activity intolerance will decrease Outcome: Progressing   Problem: Nutrition: Goal: Adequate nutrition will be maintained Outcome: Progressing   Problem: Coping: Goal: Level of anxiety will decrease Outcome: Not Progressing   Problem: Elimination: Goal: Will not experience complications related to bowel motility Outcome: Progressing Goal: Will not experience complications related to urinary retention Outcome: Progressing   Problem: Safety: Goal: Ability to remain free from injury will improve Outcome: Progressing   Problem: Pain Managment: Goal: General experience of comfort will improve and/or be controlled Outcome: Progressing   Problem: Skin Integrity: Goal: Risk for impaired skin integrity will decrease Outcome: Progressing   Problem: Education: Goal: Knowledge of prescribed regimen will improve Outcome: Progressing   Problem: Activity: Goal: Ability to tolerate increased activity will improve Outcome: Progressing   Problem: Bowel/Gastric: Goal: Gastrointestinal status for postoperative course will improve Outcome: Progressing    Problem: Clinical Measurements: Goal: Postoperative complications will be avoided or minimized Outcome: Progressing Goal: Signs and symptoms of graft occlusion will improve Outcome: Progressing   Problem: Skin Integrity: Goal: Demonstration of wound healing without infection will improve Outcome: Progressing

## 2023-09-01 LAB — GLUCOSE, CAPILLARY
Glucose-Capillary: 132 mg/dL — ABNORMAL HIGH (ref 70–99)
Glucose-Capillary: 179 mg/dL — ABNORMAL HIGH (ref 70–99)
Glucose-Capillary: 150 mg/dL — ABNORMAL HIGH (ref 70–99)
Glucose-Capillary: 162 mg/dL — ABNORMAL HIGH (ref 70–99)

## 2023-09-01 MED ORDER — CLOPIDOGREL BISULFATE 75 MG PO TABS
75.0000 mg | ORAL_TABLET | Freq: Every day | ORAL | Status: DC
  Administered 2023-09-01 – 2023-09-03 (×3): 75 mg via ORAL
  Filled 2023-09-01 (×3): qty 1

## 2023-09-01 NOTE — Progress Notes (Signed)
  Progress Note    09/01/2023 9:33 AM 8 Days Post-Op  Subjective:  Chanay Poullard is a 78 yo female now POD #7 from Bilateral femoral Endarterectomies with Iliac stent placement and abdominal aortic endovascular stent placement. Patient remains on the floor. Nightly sundowning getting better. Continues to walk the halls with assistance. Recovering as expected. No complaints overnight and vitals all remain stable.    Vitals:   09/01/23 0446 09/01/23 0914  BP: 133/60 (!) 123/58  Pulse: 71 71  Resp: 20 18  Temp: 98.4 F (36.9 C) 98.3 F (36.8 C)  SpO2: 99% 100%   Physical Exam: Cardiac:  RRR, No murmurs. Lungs:  Clear on auscultation throughout, no wheezing rales or rhonchi Incisions:  Bilateral groins with prevena Wound Vac Extremities:  Bilateral lower extremities warm to touch. Palpable pulses Abdomen:  Positive bowel sounds  Neurologic: AAOX2 this morning. Appears pleasantly confused and non-agitated.   CBC    Component Value Date/Time   WBC 8.2 08/28/2023 1221   RBC 2.53 (L) 08/28/2023 1221   HGB 7.8 (L) 08/28/2023 1221   HGB 10.7 (L) 08/15/2023 1341   HCT 24.1 (L) 08/28/2023 1221   HCT 35.8 08/15/2023 1341   PLT 281 08/28/2023 1221   PLT 513 (H) 08/15/2023 1341   MCV 95.3 08/28/2023 1221   MCV 98 (H) 08/15/2023 1341   MCV 87 07/12/2014 2035   MCH 30.8 08/28/2023 1221   MCHC 32.4 08/28/2023 1221   RDW 20.5 (H) 08/28/2023 1221   RDW 16.9 (H) 08/15/2023 1341   RDW 18.1 (H) 07/12/2014 2035   LYMPHSABS 0.4 (L) 08/17/2023 1050   LYMPHSABS 0.8 08/15/2023 1341   MONOABS 0.6 08/17/2023 1050   EOSABS 0.0 08/17/2023 1050   EOSABS 0.1 08/15/2023 1341   BASOSABS 0.1 08/17/2023 1050   BASOSABS 0.1 08/15/2023 1341    BMET    Component Value Date/Time   NA 133 (L) 08/28/2023 1221   NA 142 07/12/2014 2035   K 3.4 (L) 08/28/2023 1221   K 3.4 (L) 07/12/2014 2035   CL 99 08/28/2023 1221   CL 107 07/12/2014 2035   CO2 23 08/28/2023 1221   CO2 30 07/12/2014 2035    GLUCOSE 141 (H) 08/28/2023 1221   GLUCOSE 133 (H) 07/12/2014 2035   BUN 14 08/28/2023 1221   BUN 11 07/12/2014 2035   CREATININE 0.66 08/31/2023 0558   CREATININE 0.73 07/12/2014 2035   CALCIUM 8.3 (L) 08/28/2023 1221   CALCIUM 8.3 (L) 07/12/2014 2035   GFRNONAA >60 08/31/2023 0558   GFRNONAA >60 07/12/2014 2035   GFRAA >60 05/18/2018 0515   GFRAA >60 07/12/2014 2035    INR    Component Value Date/Time   INR 1.2 12/12/2021 2350    No intake or output data in the 24 hours ending 09/01/23 0933   Assessment/Plan:  78 y.o. female is s/p Bilateral femoral Endarterectomies with Iliac stent placement and abdominal aortic endovascular stent placement.  8 Days Post-Op   PLAN: Continue sitter/safety, prn evening Valium OOB to chair/ambulate with PT Plan for disposition, this week with Rehab/SNF placement. ASA/Plavix   DVT prophylaxis:  Lovenox 40 mg Q24   Shahan Starks R [facility] 09/01/2023 9:33 AM

## 2023-09-01 NOTE — Plan of Care (Signed)
  Problem: Activity: Goal: Ability to tolerate increased activity will improve Outcome: Progressing   Problem: Bowel/Gastric: Goal: Gastrointestinal status for postoperative course will improve Outcome: Progressing   Problem: Clinical Measurements: Goal: Postoperative complications will be avoided or minimized Outcome: Progressing   Problem: Skin Integrity: Goal: Demonstration of wound healing without infection will improve Outcome: Progressing

## 2023-09-01 NOTE — Progress Notes (Signed)
Physical Therapy Treatment Patient Details Name: Yolanda Jimenez MRN: [id number] DOB: [birth year] Today's Date: 09/01/2023   History of Present Illness Pt is a 78 y/o F admitted on 08/24/23 for scheduled bilateral femoral endarterectomy. PMH: a-fib, asthma, AVM, COPD, depression, DM, heart murmur, HLD, IBS, PVD.    PT Comments  Pt is awake on entry, alert, interactive, attests to some strange dreams she had yesterday which have been somewhat disorienting, but admits to some confusion issues since being here. Pt denies any pain during session. No assistance needed for bed mobility or transfers, but RW recommended for balance for all upright activity. Pt able to advance AMB to >598ft, mild dyspnea over final 41ft. Pt partakes in standing balance dual tasking activity at end of AMB without any prior sitting interval. Pt left up in chair at end of session with OT at bedside. Pt continues to makes excellent progress in regaining mobility, continues to present with mild memory deficits, balance impairment, slight disorientation, all of which may be part of PLOF. Will continue to follow.    If plan is discharge home, recommend the following: Assist for transportation;Assistance with cooking/housework;Supervision due to cognitive status;A little help with walking and/or transfers;Direct supervision/assist for medications management;Direct supervision/assist for financial management   Can travel by private vehicle        Equipment Recommendations  None recommended by PT    Recommendations for Other Services       Precautions / Restrictions Precautions Precautions: Fall Restrictions Weight Bearing Restrictions Per Provider Order: No     Mobility  Bed Mobility Overal bed mobility: Modified Independent                  Transfers Overall transfer level: Needs assistance Equipment used: Rolling walker (2 wheels) Transfers: Sit to/from Stand Sit to Stand: Supervision                 Ambulation/Gait   Gait Distance (Feet): 540 Feet Assistive device: Rolling walker (2 wheels) Gait Pattern/deviations: Step-through pattern, WFL(Within Functional Limits) Gait velocity: 0.72m/s     General Gait Details: intermittent LOB, staggering   Stairs             Wheelchair Mobility     Tilt Bed    Modified Rankin (Stroke Patients Only)       Balance Overall balance assessment: Mild deficits observed, not formally tested                                          Cognition Arousal: Alert Behavior During Therapy: WFL for tasks assessed/performed Overall Cognitive Status: Impaired/Different from baseline (reports some disorientation issues since admission as well as some vivd disorienting dreams last night;  memory issues lightly worse than baseline)                                          Exercises Other Exercises Other Exercises: standing unsupported at white board with letter search and finger tap: (went throughout alphabet except for J, Q, X, Z) no LOB; mild tremor of hand noted during activity    General Comments        Pertinent Vitals/Pain Pain Assessment Pain Assessment: No/denies pain    Home Living  Prior Function            PT Goals (current goals can now be found in the care plan section) Acute Rehab PT Goals Patient Stated Goal: decreased pain, get better, go home PT Goal Formulation: With patient Time For Goal Achievement: 09/08/23 Potential to Achieve Goals: Good Progress towards PT goals: Progressing toward goals    Frequency    Min 1X/week      PT Plan      Co-evaluation              AM-PAC PT "6 Clicks" Mobility   Outcome Measure  Help needed turning from your back to your side while in a flat bed without using bedrails?: None Help needed moving from lying on your back to sitting on the side of a flat bed without using bedrails?:  None Help needed moving to and from a bed to a chair (including a wheelchair)?: A Little Help needed standing up from a chair using your arms (e.g., wheelchair or bedside chair)?: A Little Help needed to walk in hospital room?: A Little Help needed climbing 3-5 steps with a railing? : A Little 6 Click Score: 20    End of Session Equipment Utilized During Treatment: Gait belt Activity Tolerance: Patient tolerated treatment well Patient left: in chair;with nursing/sitter in room;with call bell/phone within reach   PT Visit Diagnosis: Muscle weakness (generalized) (M62.81);Other abnormalities of gait and mobility (R26.89);Difficulty in walking, not elsewhere classified (R26.2)     Time: 1610-9604 PT Time Calculation (min) (ACUTE ONLY): 21 min  Charges:    $Therapeutic Activity: 8-22 mins PT General Charges $$ ACUTE PT VISIT: 1 Visit                    4:28 PM, 09/01/23 Rosamaria Lints, PT, DPT Physical Therapist - [facility]  [phone] (ASCOM)    [name] C 09/01/2023, 4:25 PM

## 2023-09-01 NOTE — Progress Notes (Signed)
Occupational Therapy Treatment Patient Details Name: Yolanda Jimenez MRN: [id number] DOB: July 22, 1946 Today's Date: 09/01/2023   History of present illness Pt is a 78 y/o F admitted on 08/24/23 for scheduled bilateral femoral endarterectomy. PMH: a-fib, asthma, AVM, COPD, depression, DM, heart murmur, HLD, IBS, PVD   OT comments  Pt seen for OT tx this date. Pleasant, motivated and generally A&Ox4. STM deficits noted throughout, but pt overall able to complete ADL session seated in recliner with setup of items and supervision. Stands briefly to [employer] supervision. Pt is making progress towards goals, will continue to follow for functional gains. Patient will benefit from continued inpatient follow up therapy, <3 hours/day.       If plan is discharge home, recommend the following:  A little help with bathing/dressing/bathroom;Supervision due to cognitive status;Help with stairs or ramp for entrance;A little help with walking and/or transfers;Assistance with cooking/housework;Direct supervision/assist for medications management;Direct supervision/assist for financial management;Assist for transportation   Equipment Recommendations  BSC/3in1       Precautions / Restrictions Precautions Precautions: Fall Restrictions Weight Bearing Restrictions Per Provider Order: No       Mobility Bed Mobility               General bed mobility comments: in recliner start and end of session    Transfers Overall transfer level: Needs assistance Equipment used: None Transfers: Sit to/from Stand Sit to Stand: Supervision                 Balance Overall balance assessment: Needs assistance Sitting-balance support: Feet supported Sitting balance-Leahy Scale: Good     Standing balance support: No upper extremity supported, During functional activity Standing balance-Leahy Scale: Fair Standing balance comment: supervision for brief stand to doff gown                            ADL either performed or assessed with clinical judgement   ADL Overall ADL's : Needs assistance/impaired     Grooming: Wash/dry hands;Wash/dry face;Applying deodorant;Sitting;Set up   Upper Body Bathing: Set up;Sitting   Lower Body Bathing: Set up;Sit to/from stand   Upper Body Dressing : Set up;Sitting Upper Body Dressing Details (indicate cue type and reason): donns and doffs gown seated in recliner Lower Body Dressing: Supervision/safety Lower Body Dressing Details (indicate cue type and reason): dons and doffs socks               General ADL Comments: UB/LB bathing from sit<>stands recliner      Cognition Arousal: Alert Behavior During Therapy: WFL for tasks assessed/performed Overall Cognitive Status: Impaired/Different from baseline Area of Impairment: Attention, Memory, Following commands, Safety/judgement, Awareness, Problem solving                   Current Attention Level: Focused Memory: Decreased short-term memory Following Commands: Follows one step commands inconsistently, Follows one step commands with increased time Safety/Judgement: Decreased awareness of safety, Decreased awareness of deficits Awareness: Intellectual Problem Solving: Slow processing, Decreased initiation, Difficulty sequencing, Requires verbal cues, Requires tactile cues General Comments: pt with improving orientation A&Ox4 overall; some decreased STM noted (pt requesting new socks, and immediately putting old socks back on after she asked for new ones)                   Pertinent Vitals/ Pain       Pain Assessment Pain Assessment: No/denies pain Pain Score: 0-No pain   Frequency  Min 1X/week        Progress Toward Goals  OT Goals(current goals can now be found in the care plan section)  Progress towards OT goals: Progressing toward goals  Acute Rehab OT Goals OT Goal Formulation: With patient/family Time For Goal Achievement: 09/08/23 Potential to  Achieve Goals: Good ADL Goals Pt Will Perform Grooming: with modified independence;standing Pt Will Perform Lower Body Dressing: with modified independence;sit to/from stand Pt Will Transfer to Toilet: with modified independence;ambulating;regular height toilet  Plan         AM-PAC OT "6 Clicks" Daily Activity     Outcome Measure   Help from another person eating meals?: None Help from another person taking care of personal grooming?: None Help from another person toileting, which includes using toliet, bedpan, or urinal?: A Little Help from another person bathing (including washing, rinsing, drying)?: A Little Help from another person to put on and taking off regular upper body clothing?: None Help from another person to put on and taking off regular lower body clothing?: A Little 6 Click Score: 21    End of Session    OT Visit Diagnosis: Other abnormalities of gait and mobility (R26.89);Muscle weakness (generalized) (M62.81)   Activity Tolerance Patient tolerated treatment well   Patient Left in chair;with call bell/phone within reach;with chair alarm set;Other (comment);with nursing/sitter in room Nutritional therapist)   Nurse Communication Mobility status        Time: [phone] OT Time Calculation (min): 16 min  Charges: OT General Charges $OT Visit: 1 Visit OT Treatments $Self Care/Home Management : 8-22 mins  Verbena Boeding L. Franchon Ketterman, OTR/L  09/01/23, 12:30 PM

## 2023-09-01 NOTE — Plan of Care (Signed)

## 2023-09-01 NOTE — Progress Notes (Signed)
Mobility Specialist - Progress Note   09/01/23 1100  Mobility  Activity Ambulated with assistance in hallway  Level of Assistance Contact guard assist, steadying assist  Assistive Device Front wheel walker  Distance Ambulated (ft) 320 ft  Activity Response Tolerated well  Mobility visit 1 Mobility     Pt lying in bed upon arrival, utilizing RA. Pt agreeable to activity. AO to self, situation, and time. Completed bed mobility with supervision. STS and ambulation with CGA. Pt ambulated 2 laps around NS, denied pain. Occasional scissoring gait, especially when distracted. Pt left in bed with alarm set, needs in reach.    Yolanda Jimenez Mobility Specialist 09/01/23, 11:29 AM

## 2023-09-02 LAB — GLUCOSE, CAPILLARY
Glucose-Capillary: 167 mg/dL — ABNORMAL HIGH (ref 70–99)
Glucose-Capillary: 192 mg/dL — ABNORMAL HIGH (ref 70–99)
Glucose-Capillary: 128 mg/dL — ABNORMAL HIGH (ref 70–99)
Glucose-Capillary: 147 mg/dL — ABNORMAL HIGH (ref 70–99)

## 2023-09-02 MED ORDER — CLOPIDOGREL BISULFATE 75 MG PO TABS: 75.0000 mg | ORAL_TABLET | Freq: Every day | ORAL | 6 refills | Status: AC

## 2023-09-02 NOTE — Discharge Summary (Signed)
[facility]    Discharge Summary    Patient ID:  Yolanda Jimenez MRN: [id number] DOB/AGE: [birth year] 78 y.o.  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) [I70.219]  Secondary Diagnoses: Past Medical History:  Diagnosis Date   A-fib [facility])    a.) CHA2DS2-VASc = 5 (age x2, sex, vascular disease history, T2DM) as of 08/23/2023; b.) cardiac rate/rhythm maintained on oral metoprolol succinate; no chronic OAC   Abnormal vaginal Pap smear 2015   Anxiety    a.) on BZO PRN (diazepam)   Aortic atherosclerosis (HCC)    Aortic stenosis 05/03/2023   a.) TTE 05/03/2023: mild AS (MPG 12 mmHg; AVA = 1.3 cm2)   Arthritis of spine    Asthma    Barrett esophagus    Benign hematuria 08/29/2014   W/u neg  Normal renal ultrasound 2/19  Cystoscopy normal 2019, [place]  CT -9/24     Bilateral carotid artery disease (HCC)    a.) carotid dopplers 10/13/2016, 10/24/2018, 10/26/2019, 10/21/2020, 10/22/2022: 1-39% BICA   C. difficile diarrhea    CAD (coronary artery disease)    a.) CT chest 07/12/2018: LAD calcifications; b.) CT CAP 11/18/2022: 3 vessel CAD; c.) cPET 05/19/2023: severe 4 vessel coronary calcs mainly in LAD distribution; d.) cPET 05/03/2023: small/mild reversible defect in mid-anterosep/ant region   Complication of anesthesia    a.) delayed emergence; b.) emergence delirium; "I get really loopy after surgery"   COPD (chronic obstructive pulmonary disease) (HCC)    DDD (degenerative disc disease), lumbosacral    Depression    Diverticulosis    Epigastric hernia    GERD (gastroesophageal reflux disease)    Glaucoma    Heart murmur    Heart palpitations     History of bilateral cataract extraction 2015   History of recurrent UTIs    Hx of adenomatous colonic polyps    Hyperlipidemia    IBS (irritable bowel syndrome)    Insomnia    Iron deficiency anemia    Kidney cysts    Long term current use of aspirin    Macular degeneration of left eye    Osteoporosis    a.) on RANKLi (denosumab)   PVD (peripheral vascular disease) with claudication (HCC)    Small bowel arteriovenous malformation 2007   a.) noted on VCE in 2007   Type 2 diabetes, diet controlled (HCC)    Wears dentures    partial lower    Procedure(s): ENDARTERECTOMY FEMORAL APPLICATION OF CELL SAVER INSERTION OF ILIAC STENT ABDOMINAL AORTIC ENDOVASCULAR STENT GRAFT  Discharged Condition: good  HPI:  Yolanda Jimenez is a 78 yo female POD #9 from bilateral femoral endarterectomies.  Patient is recovering as expected.  Patient noted to have bilateral groin incisions with staples clean dry and intact.  No hematoma seroma or infection to note at this time.  Patient is ambulating the halls well and eating well and urinating well.  Vital signs all remained stable patient to be discharged to home with home health physical therapy, Occupational Therapy and social work.  Patient will be discharged on aspirin 81 mg daily, Plavix 75 mg daily, and Crestor 20 mg daily.  Patient was instructed to make sure she takes these medications daily and does not miss any doses as it  will affect the outcome of her surgery.  Patient verbalizes her understanding.  Hospital Course:  [name] is a 78 y.o. female is S/P Bilateral femoral Endarterectomies.  Patient recovering as expected.  Patient to be discharged to home with home health services including physical therapy, Occupational Therapy and social work. Extubated: POD # 0 Physical Exam:  Alert notes x3, no acute distress Face: Symmetrical.  Tongue is midline. Neck: Trachea is midline.  No swelling or bruising. Cardiovascular: Regular rate and  rhythm Pulmonary: Clear to auscultation bilaterally Abdomen: Soft, nontender, nondistended Right groin access: Clean dry and intact.  No swelling or drainage noted Left groin access: Clean dry and intact.  No swelling or drainage noted Left lower extremity: Thigh soft.  Calf soft.  Extremities warm distally toes.  Hard to palpate pedal pulses however the foot is warm is her good capillary refill. Right lower extremity: Thigh soft.  Calf soft.  Extremities warm distally toes.  Hard to palpate pedal pulses however the foot is warm is her good capillary refill. Neurological: No deficits noted   Post-op wounds:  clean, dry, intact or healing well  Pt. Ambulating, voiding and taking PO diet without difficulty. Pt pain controlled with PO pain meds.  Labs:  As below  Complications: none  Consults:    Significant Diagnostic Studies: CBC Lab Results  Component Value Date   WBC 8.2 08/28/2023   HGB 7.8 (L) 08/28/2023   HCT 24.1 (L) 08/28/2023   MCV 95.3 08/28/2023   PLT 281 08/28/2023    BMET    Component Value Date/Time   NA 133 (L) 08/28/2023 1221   NA 142 07/12/2014 2035   K 3.4 (L) 08/28/2023 1221   K 3.4 (L) 07/12/2014 2035   CL 99 08/28/2023 1221   CL 107 07/12/2014 2035   CO2 23 08/28/2023 1221   CO2 30 07/12/2014 2035   GLUCOSE 141 (H) 08/28/2023 1221   GLUCOSE 133 (H) 07/12/2014 2035   BUN 14 08/28/2023 1221   BUN 11 07/12/2014 2035   CREATININE 0.66 08/31/2023 0558   CREATININE 0.73 07/12/2014 2035   CALCIUM 8.3 (L) 08/28/2023 1221   CALCIUM 8.3 (L) 07/12/2014 2035   GFRNONAA >60 08/31/2023 0558   GFRNONAA >60 07/12/2014 2035   GFRAA >60 05/18/2018 0515   GFRAA >60 07/12/2014 2035   COAG Lab Results  Component Value Date   INR 1.2 12/12/2021     Disposition:  Discharge to :Home  Allergies as of 09/02/2023       Reactions   Codeine    "felt drunk, crazy"   Hydrocodone Other (See Comments)   Hydrocodone-acetaminophen Other (See Comments)    Confusion/delirium   Lidocaine    Heart racing, increased BP - from local at dentist (likely epi)   Nsaids Other (See Comments)   Avoids due to IBS symptoms (bleeding symptoms)   Oxycodone    Confusion/delirium        Medication List     TAKE these medications    acetaminophen 500 MG tablet Commonly known as: TYLENOL Take 1,000 mg by mouth every 6 (six) hours as needed.   aspirin EC 81 MG tablet Take 1 tablet (81 mg total) by mouth daily. Swallow whole.   Cholecalciferol 125 MCG (5000 UT) Tabs Take 1 tablet by mouth daily.   clopidogrel 75 MG tablet Commonly known as: PLAVIX Take 1 tablet (75 mg total) by mouth daily. Start taking on: September 03, 2023   cyanocobalamin 1000 MCG tablet Take  1,000 mcg by mouth daily.   diazepam 5 MG tablet Commonly known as: VALIUM Take 1 tablet (5 mg total) by mouth daily as needed for anxiety. What changed: when to take this   Ferrous Gluconate 239 (27 Fe) MG Tabs Take 1 tablet by mouth daily.   fluticasone 50 MCG/ACT nasal spray Commonly known as: FLONASE Place 1 spray into both nostrils daily as needed for allergies or rhinitis.   loperamide 2 MG capsule Commonly known as: IMODIUM Take 2 mg by mouth every morning.   metoprolol succinate 25 MG 24 hr tablet Commonly known as: TOPROL-XL Take 1 tablet by mouth daily.   omeprazole 40 MG capsule Commonly known as: PRILOSEC Take 40 mg by mouth daily before breakfast.   PRESERVISION AREDS 2 PO Take 2 tablets by mouth in the morning.   Prolia 60 MG/ML Sosy injection Generic drug: denosumab Inject 60 mg into the skin every 6 (six) months.   rosuvastatin 20 MG tablet Commonly known as: CRESTOR Take 20 mg by mouth daily.   venlafaxine 25 MG tablet Commonly known as: EFFEXOR Take 25 mg by mouth 2 (two) times daily.   vitamin C 1000 MG tablet Take 1,000 mg by mouth in the morning.       Verbal and written Discharge instructions given to the patient. Wound care per  Discharge AVS  Follow-up Information     Georgiana Spinner, NP Follow up in 3 week(s).   Specialty: Vascular Surgery Why: Staple Removal and Bilateral lower extremities Ultrasounds with ABI's. Contact information: [address] [place] [address] [phone]                 Signed: Marcie Bal, NP  09/02/2023, 3:09 PM

## 2023-09-02 NOTE — Progress Notes (Signed)
Physical Therapy Treatment Patient Details Name: Yolanda Jimenez MRN: [id number] DOB: [birth year] Today's Date: 09/02/2023   History of Present Illness Pt is a 78 y/o F admitted on 08/24/23 for scheduled bilateral femoral endarterectomy. PMH: a-fib, asthma, AVM, COPD, depression, DM, heart murmur, HLD, IBS, PVD.    PT Comments  Pt in bed on entry, DIL present who provides insight on recent cognitive abnormalities- says pt remains off baseline as per memory problems. Pt continues to advance in AMB tolerance, similar pacing and distance today with RW, then we transition to standing balance and cognitive dual tasking activity which pt does well with. Pt continues to show more unsteadiness when attempting to AMB without device, unclear why she keeps trying this. Pt currently tolerating limited community distances at appropriate pacing. Discussed with TOC and DIL need for supervision for safety at DC, but her mobility improvements are beyond what would benefit from a STR stay.     If plan is discharge home, recommend the following: Assist for transportation;Assistance with cooking/housework;Supervision due to cognitive status;A little help with walking and/or transfers;Direct supervision/assist for medications management;Direct supervision/assist for financial management   Can travel by private vehicle     Yes  Equipment Recommendations  None recommended by PT    Recommendations for Other Services       Precautions / Restrictions Precautions Precautions: Fall Restrictions Weight Bearing Restrictions Per Provider Order: No     Mobility  Bed Mobility Overal bed mobility: Modified Independent                  Transfers Overall transfer level: Needs assistance Equipment used: None Transfers: Sit to/from Stand Sit to Stand: Supervision           General transfer comment: quite unsteady, unclear whey she attempts to do this without RW despite known unsteadiness issues     Ambulation/Gait Ambulation/Gait assistance: Supervision, Contact guard assist Gait Distance (Feet): 540 Feet Assistive device: Rolling walker (2 wheels)   Gait velocity: 3 laps around 2C and needs intermittent cues to find her way.     General Gait Details: no LOB today   Stairs             Wheelchair Mobility     Tilt Bed    Modified Rankin (Stroke Patients Only)       Balance                                            Cognition Arousal: Alert Behavior During Therapy: WFL for tasks assessed/performed Overall Cognitive Status: Impaired/Different from baseline (contiues to have mild disorientation, memory recall issues worse than baseline per DIL)                                          Exercises Other Exercises Other Exercises: unsupported static stance pillow toss/catch with DIL: names months in descending order with with toss/catch ((no LOB)) Other Exercises: unsupported static stance pillow toss/catch with DIL: names food or animal in ascending order of alphabet ((no LOB))    General Comments        Pertinent Vitals/Pain Pain Assessment Pain Assessment: No/denies pain    Home Living  Prior Function            PT Goals (current goals can now be found in the care plan section) Acute Rehab PT Goals Patient Stated Goal: decreased pain, get better, go home PT Goal Formulation: With patient Time For Goal Achievement: 09/08/23 Potential to Achieve Goals: Good Progress towards PT goals: Progressing toward goals    Frequency    Min 1X/week      PT Plan      Co-evaluation              AM-PAC PT "6 Clicks" Mobility   Outcome Measure  Help needed turning from your back to your side while in a flat bed without using bedrails?: None Help needed moving from lying on your back to sitting on the side of a flat bed without using bedrails?: None Help needed moving to  and from a bed to a chair (including a wheelchair)?: A Little Help needed standing up from a chair using your arms (e.g., wheelchair or bedside chair)?: A Little Help needed to walk in hospital room?: A Little Help needed climbing 3-5 steps with a railing? : A Little 6 Click Score: 20    End of Session Equipment Utilized During Treatment: Gait belt Activity Tolerance: Patient tolerated treatment well;No increased pain Patient left: in bed;with call bell/phone within reach;with family/visitor present Nurse Communication: Mobility status PT Visit Diagnosis: Muscle weakness (generalized) (M62.81);Other abnormalities of gait and mobility (R26.89);Difficulty in walking, not elsewhere classified (R26.2)     Time: 2952-8413 PT Time Calculation (min) (ACUTE ONLY): 26 min  Charges:    $Therapeutic Activity: 23-37 mins PT General Charges $$ ACUTE PT VISIT: 1 Visit                    2:27 PM, 09/02/23 Rosamaria Lints, PT, DPT Physical Therapist - [facility]  [phone] (ASCOM)     Yolanda Jimenez 09/02/2023, 2:24 PM

## 2023-09-02 NOTE — Discharge Instructions (Signed)
Vascular Surgery  Discharge Instructions:  Do not lift anything heavy for the next 2 weeks.  Do not lift anything more than a gallon of milk.  Do not drive for the next 2 weeks.  Do not shower until Sunday, 09/04/2023.  When showering do not stand directly facing the water to your groin areas.  It is okay to use soap and water but do not scrub your groins.  After the shower get out and pat dry your groin area is completely.  Keep your groin areas dry at all times if possible.  You are being discharged on ASA 81 mg daily, Plavix 75 mg daily, and Crestor 20 mg daily.  Please do not skip any of these medications or miss a dose of these medications as they will directly affect the outcome of your surgery.  Follow-up with vein and vascular surgery in 2 weeks for staple removal.  If you cannot make the appointment that scheduled for you please call the office to reschedule.

## 2023-09-02 NOTE — Plan of Care (Signed)

## 2023-09-02 NOTE — Progress Notes (Addendum)
Mobility Specialist - Progress Note   09/02/23 1200  Mobility  Activity Ambulated with assistance in hallway;Ambulated with assistance to bathroom;Dangled on edge of bed  Level of Assistance Minimal assist, patient does 75% or more  Assistive Device None  Distance Ambulated (ft) 190 ft  Activity Response Tolerated well  Mobility visit 1 Mobility     Pt lying in bed upon arrival, utilizing RA. Pt agitated on arrival stating "they are holding me hostage in here! I was supposed to be a volunteer here but then they made me a patient! My sister is in [place] dying and they won't let me see her." Active listening, reorientation, and comfort provided to patient for calming. Pt states that "nobody has let me walked since I've been here, they just want me in bed all day". Pt reminded that bed alarms are in place to ensure safety of patient and that she has been working with MS/PT/OT during stay. Pt agreeable to activity. Refused RW. Ambulated in bathroom with minA before continuation into hallway. LOB x3 with occasional scissoring gait and extensive swing pattern. Pt returned to bed with alarm set, needs in reach. RN notified.    Filiberto Pinks Mobility Specialist 09/02/23, 12:11 PM

## 2023-09-02 NOTE — Plan of Care (Signed)
Problem: Clinical Measurements: Goal: Ability to maintain clinical measurements within normal limits will improve Outcome: Progressing Goal: Will remain free from infection Outcome: Progressing Goal: Diagnostic test results will improve Outcome: Progressing Goal: Respiratory complications will improve Outcome: Progressing Goal: Cardiovascular complication will be avoided Outcome: Progressing   Problem: Safety: Goal: Ability to remain free from injury will improve Outcome: Progressing

## 2023-09-03 LAB — GLUCOSE, CAPILLARY
Glucose-Capillary: 154 mg/dL — ABNORMAL HIGH (ref 70–99)
Glucose-Capillary: 143 mg/dL — ABNORMAL HIGH (ref 70–99)

## 2023-09-03 NOTE — Plan of Care (Signed)
  Problem: Education: Goal: Knowledge of General Education information will improve Description: Including pain rating scale, medication(s)/side effects and non-pharmacologic comfort measures Outcome: Adequate for Discharge   Problem: Health Behavior/Discharge Planning: Goal: Ability to manage health-related needs will improve Outcome: Adequate for Discharge   Problem: Clinical Measurements: Goal: Ability to maintain clinical measurements within normal limits will improve Outcome: Adequate for Discharge Goal: Will remain free from infection Outcome: Adequate for Discharge Goal: Diagnostic test results will improve Outcome: Adequate for Discharge Goal: Respiratory complications will improve Outcome: Adequate for Discharge Goal: Cardiovascular complication will be avoided Outcome: Adequate for Discharge   Problem: Activity: Goal: Risk for activity intolerance will decrease Outcome: Adequate for Discharge   Problem: Nutrition: Goal: Adequate nutrition will be maintained Outcome: Adequate for Discharge   Problem: Coping: Goal: Level of anxiety will decrease Outcome: Adequate for Discharge   Problem: Elimination: Goal: Will not experience complications related to bowel motility Outcome: Adequate for Discharge Goal: Will not experience complications related to urinary retention Outcome: Adequate for Discharge   Problem: Pain Managment: Goal: General experience of comfort will improve and/or be controlled Outcome: Adequate for Discharge   Problem: Safety: Goal: Ability to remain free from injury will improve Outcome: Adequate for Discharge   Problem: Skin Integrity: Goal: Risk for impaired skin integrity will decrease Outcome: Adequate for Discharge   Problem: Education: Goal: Knowledge of prescribed regimen will improve Outcome: Adequate for Discharge   Problem: Activity: Goal: Ability to tolerate increased activity will improve Outcome: Adequate for Discharge    Problem: Bowel/Gastric: Goal: Gastrointestinal status for postoperative course will improve Outcome: Adequate for Discharge   Problem: Clinical Measurements: Goal: Postoperative complications will be avoided or minimized Outcome: Adequate for Discharge Goal: Signs and symptoms of graft occlusion will improve Outcome: Adequate for Discharge   Problem: Skin Integrity: Goal: Demonstration of wound healing without infection will improve Outcome: Adequate for Discharge

## 2023-09-03 NOTE — Progress Notes (Signed)
[facility]  Daily Progress Note   Subjective  -   She is 14 days out from bilateral femoral TEA.  No Vascular issues.  Perfusion is excellent and groins have healed nicely.  Evidently confusion was the reason for her prolonged stay but she is oriented and completely appropriate for me today.  Ambulating in the halls with no difficulty.  No balance issues.  No claudication.  Objective Vitals:   09/02/23 0842 09/02/23 1628 09/02/23 2033 09/03/23 0336  BP: 135/78 128/78 120/66 130/72  Pulse: 82 64 (!) 53 (!) 56  Resp: 16 16 20 20   Temp:  98 F (36.7 C) 98.6 F (37 C) 98.5 F (36.9 C)  TempSrc:  Oral Oral Oral  SpO2: 100% 98% 99% 98%  Weight:      Height:       No intake or output data in the 24 hours ending 09/03/23 1154  PULM  CTAB CV  RRR VASC  Palpable DP pulses bilaterally.  Groin incisions clean and healed.  Dressings in place.   Laboratory CBC    Component Value Date/Time   WBC 8.2 08/28/2023 1221   HGB 7.8 (L) 08/28/2023 1221   HGB 10.7 (L) 08/15/2023 1341   HCT 24.1 (L) 08/28/2023 1221   HCT 35.8 08/15/2023 1341   PLT 281 08/28/2023 1221   PLT 513 (H) 08/15/2023 1341    BMET    Component Value Date/Time   NA 133 (L) 08/28/2023 1221   NA 142 07/12/2014 2035   K 3.4 (L) 08/28/2023 1221   K 3.4 (L) 07/12/2014 2035   CL 99 08/28/2023 1221   CL 107 07/12/2014 2035   CO2 23 08/28/2023 1221   CO2 30 07/12/2014 2035   GLUCOSE 141 (H) 08/28/2023 1221   GLUCOSE 133 (H) 07/12/2014 2035   BUN 14 08/28/2023 1221   BUN 11 07/12/2014 2035   CREATININE 0.66 08/31/2023 0558   CREATININE 0.73 07/12/2014 2035   CALCIUM 8.3 (L) 08/28/2023 1221   CALCIUM 8.3 (L) 07/12/2014 2035   GFRNONAA >60 08/31/2023 0558   GFRNONAA >60 07/12/2014 2035   GFRAA >60 05/18/2018 0515   GFRAA >60 07/12/2014 2035    Assessment/Planning: POD #17 s/p bilateral femoral TEA  Doing well.  No vascular issues.  Confusion is resolved.  Going home with her son  today.  Discharge ordered.  ASA and Plavix on discharge with usual meds as well.   Iline Oven  09/03/2023, 11:54 AM

## 2023-09-03 NOTE — TOC Transition Note (Signed)
Transition of Care [facility]) - Discharge Note   Patient Details  Name: Yolanda Jimenez MRN: [id number] Date of Birth: April 20, 1946  [facility]) CM/SW Contact:  Rodney Langton, RN Phone Number: 09/03/2023, 1:30 PM   Clinical Narrative:     Patient has discharge orders.  Initial recommendation for SNF for short term rehab, updated recommendation home with home health PT/OT/SW.  Son agrees, patient lives alone but he will be staying with her during recovery.  He will also provide transportation home today.  He does not have preference of HH agency, referral sent to and accepted by [facility] with [place].  No further TOC needs identified at this time.   Final next level of care: Home w Home Health Services Barriers to Discharge: Barriers Resolved   Patient Goals and CMS Choice Patient states their goals for this hospitalization and ongoing recovery are:: Home with home health services CMS Medicare.gov Compare Post Acute Care list provided to:: Patient Represenative (must comment) Choice offered to / list presented to : Adult Children [place] ownership interest in [facility].provided to:: Adult Children    Discharge Placement                       Discharge Plan and Services Additional resources added to the After Visit Summary for   In-house Referral: Clinical Social Work   Post Acute Care Choice: Skilled Nursing Facility                    HH Arranged: Social Work, PT, OT HH Agency: [facility] Date [facility] Agency Contacted: 09/03/23 Time HH Agency Contacted: 1330 Representative spoke with at [facility] Agency: Kandee Keen  Social Drivers of Health (SDOH) Interventions SDOH Screenings   Food Insecurity: No Food Insecurity (08/27/2023)  Housing: Low Risk  (08/27/2023)  Transportation Needs: No Transportation Needs (08/27/2023)  Utilities: Not At Risk (08/27/2023)  Depression (PHQ2-9): Low Risk  (12/23/2022)  Financial Resource Strain: Low Risk  (05/17/2023)    Received from [facility]  Social Connections: Unknown (08/27/2023)  Tobacco Use: High Risk (08/24/2023)     Readmission Risk Interventions     No data to display

## 2023-09-08 DIAGNOSIS — M47813 Spondylosis without myelopathy or radiculopathy, cervicothoracic region: Secondary | ICD-10-CM | POA: Diagnosis not present

## 2023-09-08 DIAGNOSIS — I4891 Unspecified atrial fibrillation: Secondary | ICD-10-CM | POA: Diagnosis not present

## 2023-09-08 DIAGNOSIS — I700 Atherosclerosis of aorta: Secondary | ICD-10-CM | POA: Diagnosis not present

## 2023-09-08 DIAGNOSIS — G4700 Insomnia, unspecified: Secondary | ICD-10-CM | POA: Diagnosis not present

## 2023-09-08 DIAGNOSIS — K2270 Barrett's esophagus without dysplasia: Secondary | ICD-10-CM | POA: Diagnosis not present

## 2023-09-08 DIAGNOSIS — I70219 Atherosclerosis of native arteries of extremities with intermittent claudication, unspecified extremity: Secondary | ICD-10-CM | POA: Diagnosis not present

## 2023-09-08 DIAGNOSIS — F329 Major depressive disorder, single episode, unspecified: Secondary | ICD-10-CM | POA: Diagnosis not present

## 2023-09-08 DIAGNOSIS — D509 Iron deficiency anemia, unspecified: Secondary | ICD-10-CM | POA: Diagnosis not present

## 2023-09-08 DIAGNOSIS — M810 Age-related osteoporosis without current pathological fracture: Secondary | ICD-10-CM | POA: Diagnosis not present

## 2023-09-08 DIAGNOSIS — F419 Anxiety disorder, unspecified: Secondary | ICD-10-CM | POA: Diagnosis not present

## 2023-09-08 DIAGNOSIS — Q2730 Arteriovenous malformation, site unspecified: Secondary | ICD-10-CM | POA: Diagnosis not present

## 2023-09-08 DIAGNOSIS — M47814 Spondylosis without myelopathy or radiculopathy, thoracic region: Secondary | ICD-10-CM | POA: Diagnosis not present

## 2023-09-08 DIAGNOSIS — E785 Hyperlipidemia, unspecified: Secondary | ICD-10-CM | POA: Diagnosis not present

## 2023-09-08 DIAGNOSIS — M47812 Spondylosis without myelopathy or radiculopathy, cervical region: Secondary | ICD-10-CM | POA: Diagnosis not present

## 2023-09-08 DIAGNOSIS — I350 Nonrheumatic aortic (valve) stenosis: Secondary | ICD-10-CM | POA: Diagnosis not present

## 2023-09-08 DIAGNOSIS — K439 Ventral hernia without obstruction or gangrene: Secondary | ICD-10-CM | POA: Diagnosis not present

## 2023-09-08 DIAGNOSIS — Z48812 Encounter for surgical aftercare following surgery on the circulatory system: Secondary | ICD-10-CM | POA: Diagnosis not present

## 2023-09-08 DIAGNOSIS — E1151 Type 2 diabetes mellitus with diabetic peripheral angiopathy without gangrene: Secondary | ICD-10-CM | POA: Diagnosis not present

## 2023-09-08 DIAGNOSIS — K5790 Diverticulosis of intestine, part unspecified, without perforation or abscess without bleeding: Secondary | ICD-10-CM | POA: Diagnosis not present

## 2023-09-08 DIAGNOSIS — Q6102 Congenital multiple renal cysts: Secondary | ICD-10-CM | POA: Diagnosis not present

## 2023-09-08 DIAGNOSIS — K589 Irritable bowel syndrome without diarrhea: Secondary | ICD-10-CM | POA: Diagnosis not present

## 2023-09-08 DIAGNOSIS — I2510 Atherosclerotic heart disease of native coronary artery without angina pectoris: Secondary | ICD-10-CM | POA: Diagnosis not present

## 2023-09-08 DIAGNOSIS — J4489 Other specified chronic obstructive pulmonary disease: Secondary | ICD-10-CM | POA: Diagnosis not present

## 2023-09-08 DIAGNOSIS — K219 Gastro-esophageal reflux disease without esophagitis: Secondary | ICD-10-CM | POA: Diagnosis not present

## 2023-09-13 ENCOUNTER — Ambulatory Visit (INDEPENDENT_AMBULATORY_CARE_PROVIDER_SITE_OTHER): Payer: PPO

## 2023-09-13 ENCOUNTER — Ambulatory Visit (INDEPENDENT_AMBULATORY_CARE_PROVIDER_SITE_OTHER): Payer: PPO | Admitting: Vascular Surgery

## 2023-09-13 ENCOUNTER — Encounter (INDEPENDENT_AMBULATORY_CARE_PROVIDER_SITE_OTHER): Payer: Self-pay | Admitting: Vascular Surgery

## 2023-09-13 VITALS — BP 115/79 | HR 99 | Resp 18 | Ht 60.0 in | Wt 121.0 lb

## 2023-09-13 DIAGNOSIS — E1151 Type 2 diabetes mellitus with diabetic peripheral angiopathy without gangrene: Secondary | ICD-10-CM

## 2023-09-13 DIAGNOSIS — I70212 Atherosclerosis of native arteries of extremities with intermittent claudication, left leg: Secondary | ICD-10-CM | POA: Diagnosis not present

## 2023-09-13 DIAGNOSIS — I70219 Atherosclerosis of native arteries of extremities with intermittent claudication, unspecified extremity: Secondary | ICD-10-CM

## 2023-09-13 NOTE — Progress Notes (Signed)
 Patient ID: Yolanda Jimenez, female   DOB: [birth year], 78 y.o.   MRN: [id number]  Chief Complaint  Patient presents with   Follow-up    1 month ABI    HPI Yolanda Jimenez is a 78 y.o. female.  Patient returns in follow-up about 3 weeks status post bilateral femoral endarterectomies and aortoiliac stent placement with an Endologix endoprosthesis.  She had a somewhat prolonged postoperative course due to some sundowning and mental status changes that took a few days to resolve.  She is back to her baseline of cognitive function at this point.  She has had significant improvement in her leg symptoms.  Her legs feel much better with walking.  She does still have some soreness in her groins and is eager to get the staples out today.  No fevers or chills or signs of systemic infection.   Past Medical History:  Diagnosis Date   A-fib [facility])    a.) CHA2DS2-VASc = 5 (age x2, sex, vascular disease history, T2DM) as of 08/23/2023; b.) cardiac rate/rhythm maintained on oral metoprolol  succinate; no chronic OAC   Abnormal vaginal Pap smear 2015   Anxiety    a.) on BZO PRN (diazepam )   Aortic atherosclerosis (HCC)    Aortic stenosis 05/03/2023   a.) TTE 05/03/2023: mild AS (MPG 12 mmHg; AVA = 1.3 cm2)   Arthritis of spine    Asthma    Barrett esophagus    Benign hematuria 08/29/2014   W/u neg  Normal renal ultrasound 2/19  Cystoscopy normal 2019, [place]  CT -9/24     Bilateral carotid artery disease (HCC)    a.) carotid dopplers 10/13/2016, 10/24/2018, 10/26/2019, 10/21/2020, 10/22/2022: 1-39% BICA   C. difficile diarrhea    CAD (coronary artery disease)    a.) CT chest 07/12/2018: LAD calcifications; b.) CT CAP 11/18/2022: 3 vessel CAD; c.) cPET 05/19/2023: severe 4 vessel coronary calcs mainly in LAD distribution; d.) cPET 05/03/2023: small/mild reversible defect in mid-anterosep/ant region   Complication of anesthesia    a.) delayed emergence; b.) emergence delirium; I get really loopy after  surgery   COPD (chronic obstructive pulmonary disease) (HCC)    DDD (degenerative disc disease), lumbosacral    Depression    Diverticulosis    Epigastric hernia    GERD (gastroesophageal reflux disease)    Glaucoma    Heart murmur    Heart palpitations    History of bilateral cataract extraction 2015   History of recurrent UTIs    Hx of adenomatous colonic polyps    Hyperlipidemia    IBS (irritable bowel syndrome)    Insomnia    Iron  deficiency anemia    Kidney cysts    Long term current use of aspirin     Macular degeneration of left eye    Osteoporosis    a.) on RANKLi (denosumab )   PVD (peripheral vascular disease) with claudication (HCC)    Small bowel arteriovenous malformation 2007   a.) noted on VCE in 2007   Type 2 diabetes, diet controlled (HCC)    Wears dentures    partial lower    Past Surgical History:  Procedure Laterality Date   ABDOMINAL AORTIC ENDOVASCULAR STENT GRAFT N/A 08/24/2023   Procedure: ABDOMINAL AORTIC ENDOVASCULAR STENT GRAFT;  Surgeon: Marea Selinda RAMAN, MD;  Location: [facility] ORS;  Service: Vascular;  Laterality: N/A;   BUNIONECTOMY Right    CATARACT EXTRACTION Bilateral    CERVICAL CONE BIOPSY     CHOLECYSTECTOMY  COLONOSCOPY     COLONOSCOPY WITH PROPOFOL  N/A 01/22/2019   Procedure: COLONOSCOPY WITH BIOPSY;  Surgeon: Jinny Carmine, MD;  Location: [facility];  Service: Endoscopy;  Laterality: N/A;  diabetic - diet controlled   COLONOSCOPY WITH PROPOFOL  N/A 06/27/2020   Procedure: COLONOSCOPY WITH BIOPSY;  Surgeon: Jinny Carmine, MD;  Location: [facility];  Service: Endoscopy;  Laterality: N/A;  priority 4   ENDARTERECTOMY FEMORAL Bilateral 08/24/2023   Procedure: ENDARTERECTOMY FEMORAL;  Surgeon: Marea Selinda RAMAN, MD;  Location: [facility] OR[facility];  Service: Vascular;  Laterality: Bilateral;   ESOPHAGOGASTRODUODENOSCOPY (EGD) WITH PROPOFOL  N/A 08/29/2017   Procedure: ESOPHAGOGASTRODUODENOSCOPY (EGD) WITH PROPOFOL ;  Surgeon: Jinny Carmine, MD;   Location: [facility];  Service: Endoscopy;  Laterality: N/A;   FOOT SURGERY     right 2nd toe   HIP ARTHROPLASTY Left 05/16/2018   Procedure: ARTHROPLASTY BIPOLAR HIP (HEMIARTHROPLASTY);  Surgeon: Edie Norleen PARAS, MD;  Location: [facility];  Service: Orthopedics;  Laterality: Left;   INSERTION OF ILIAC STENT Bilateral 08/24/2023   Procedure: INSERTION OF ILIAC STENT;  Surgeon: Marea Selinda RAMAN, MD;  Location: [facility];  Service: Vascular;  Laterality: Bilateral;   LOWER EXTREMITY ANGIOGRAPHY Left 04/14/2023   Procedure: Lower Extremity Angiography;  Surgeon: Marea Selinda RAMAN, MD;  Location: [facility];  Service: Cardiovascular;  Laterality: Left;   ORIF PERIPROSTHETIC FRACTURE Left 12/10/2021   Procedure: OPEN REDUCTION INTERNAL FIXATION OF A LEFT GREATER TROCHANTERIC FRACTURE;  Surgeon: Edie Norleen PARAS, MD;  Location: [facility];  Service: Orthopedics;  Laterality: Left;   ORIF WRIST FRACTURE Right 10/07/2020   Procedure: OPEN REDUCTION INTERNAL FIXATION (ORIF) WRIST FRACTURE;  Surgeon: Kathlynn Sharper, MD;  Location: [facility];  Service: Orthopedics;  Laterality: Right;   POLYPECTOMY N/A 01/22/2019   Procedure: POLYPECTOMY;  Surgeon: Jinny Carmine, MD;  Location: [facility];  Service: Endoscopy;  Laterality: N/A;  2 Clips placed at polyp removal site in ascending colon   POLYPECTOMY N/A 06/27/2020   Procedure: POLYPECTOMY;  Surgeon: Jinny Carmine, MD;  Location: [facility];  Service: Endoscopy;  Laterality: N/A;   TUBAL LIGATION     UPPER GASTROINTESTINAL ENDOSCOPY        Allergies  Allergen Reactions   Codeine     felt drunk, crazy   Hydrocodone Other (See Comments)   Hydrocodone-Acetaminophen  Other (See Comments)    Confusion/delirium   Lidocaine      Heart racing, increased BP - from local at dentist (likely epi)   Nsaids Other (See Comments)    Avoids due to IBS symptoms (bleeding symptoms)   Oxycodone      Confusion/delirium    Current Outpatient  Medications  Medication Sig Dispense Refill   acetaminophen  (TYLENOL ) 500 MG tablet Take 1,000 mg by mouth every 6 (six) hours as needed.     Ascorbic Acid  (VITAMIN C ) 1000 MG tablet Take 1,000 mg by mouth in the morning.     aspirin  EC 81 MG tablet Take 1 tablet (81 mg total) by mouth daily. Swallow whole. 150 tablet 2   Cholecalciferol  125 MCG (5000 UT) TABS Take 1 tablet by mouth daily.     clopidogrel  (PLAVIX ) 75 MG tablet Take 1 tablet (75 mg total) by mouth daily. 30 tablet 6   cyanocobalamin  1000 MCG tablet Take 1,000 mcg by mouth daily.     denosumab  (PROLIA ) 60 MG/ML SOSY injection Inject 60 mg into the skin every 6 (six) months.     diazepam  (VALIUM ) 5 MG tablet Take 1 tablet (  5 mg total) by mouth daily as needed for anxiety. (Patient taking differently: Take 5 mg by mouth 2 (two) times daily as needed for anxiety.) 10 tablet 0   Ferrous Gluconate  239 (27 Fe) MG TABS Take 1 tablet by mouth daily.     fluticasone  (FLONASE ) 50 MCG/ACT nasal spray Place 1 spray into both nostrils daily as needed for allergies or rhinitis.     loperamide  (IMODIUM ) 2 MG capsule Take 2 mg by mouth every morning.     metoprolol  succinate (TOPROL -XL) 25 MG 24 hr tablet Take 1 tablet by mouth daily.     Multiple Vitamins-Minerals (PRESERVISION AREDS 2 PO) Take 2 tablets by mouth in the morning.     omeprazole  (PRILOSEC ) 40 MG capsule Take 40 mg by mouth daily before breakfast.     rosuvastatin  (CRESTOR ) 20 MG tablet Take 20 mg by mouth daily.     venlafaxine  (EFFEXOR ) 25 MG tablet Take 25 mg by mouth 2 (two) times daily.     No current facility-administered medications for this visit.        Physical Exam BP 115/79   Pulse 99   Resp 18   Ht 5' (1.524 m)   Wt 121 lb (54.9 kg)   BMI 23.63 kg/m  Gen:  WD/WN, NAD Skin: incision C/D/I.  Ext: Palpable pedal pulses with minimal lower extremity swelling.     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  She may resume all normal activities.  Her staples were removed and Steri-Strips were placed today.  She will follow-up in 2 to 3 months with noninvasive studies.  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 Also, involved in wound healing. On appropriate medications.       Selinda Gu 09/13/2023, 1:13 PM   This note was created with Dragon medical transcription system.  Any errors from dictation are unintentional.

## 2023-09-13 NOTE — Assessment & Plan Note (Signed)
 ABIs today are 1.14 on the right and 1.11 on the left which is a marked improvement from her preoperative studies.  She may resume all normal activities.  Her staples were removed and Steri-Strips were placed today.  She will follow-up in 2 to 3 months with noninvasive studies.

## 2023-09-13 NOTE — Assessment & Plan Note (Signed)
blood glucose control important in reducing the progression of atherosclerotic disease. Also, involved in wound healing. On appropriate medications.  

## 2023-09-14 DIAGNOSIS — E1151 Type 2 diabetes mellitus with diabetic peripheral angiopathy without gangrene: Secondary | ICD-10-CM | POA: Diagnosis not present

## 2023-09-14 DIAGNOSIS — F0670 Mild neurocognitive disorder due to known physiological condition without behavioral disturbance: Secondary | ICD-10-CM | POA: Diagnosis not present

## 2023-09-14 DIAGNOSIS — G9340 Encephalopathy, unspecified: Secondary | ICD-10-CM | POA: Diagnosis not present

## 2023-09-14 DIAGNOSIS — I739 Peripheral vascular disease, unspecified: Secondary | ICD-10-CM | POA: Diagnosis not present

## 2023-09-14 DIAGNOSIS — G309 Alzheimer's disease, unspecified: Secondary | ICD-10-CM | POA: Diagnosis not present

## 2023-09-15 ENCOUNTER — Inpatient Hospital Stay: Payer: PPO

## 2023-09-15 ENCOUNTER — Other Ambulatory Visit: Payer: Self-pay

## 2023-09-15 ENCOUNTER — Other Ambulatory Visit: Payer: Self-pay | Admitting: *Deleted

## 2023-09-15 ENCOUNTER — Telehealth: Payer: Self-pay | Admitting: Internal Medicine

## 2023-09-15 ENCOUNTER — Inpatient Hospital Stay: Payer: PPO | Admitting: Internal Medicine

## 2023-09-15 ENCOUNTER — Encounter: Payer: Self-pay | Admitting: Internal Medicine

## 2023-09-15 ENCOUNTER — Observation Stay
Admission: EM | Admit: 2023-09-15 | Discharge: 2023-09-17 | Disposition: A | Discharge status: Home / Self Care | Payer: PPO | Attending: Family Medicine | Admitting: Family Medicine

## 2023-09-15 ENCOUNTER — Telehealth: Payer: Self-pay | Admitting: *Deleted

## 2023-09-15 ENCOUNTER — Inpatient Hospital Stay: Payer: PPO | Attending: Internal Medicine

## 2023-09-15 VITALS — BP 109/65 | HR 66 | Temp 97.9°F | Resp 18 | Ht 60.0 in | Wt 96.0 lb

## 2023-09-15 DIAGNOSIS — D500 Iron deficiency anemia secondary to blood loss (chronic): Secondary | ICD-10-CM

## 2023-09-15 DIAGNOSIS — Z720 Tobacco use: Secondary | ICD-10-CM | POA: Diagnosis present

## 2023-09-15 DIAGNOSIS — E785 Hyperlipidemia, unspecified: Secondary | ICD-10-CM | POA: Diagnosis not present

## 2023-09-15 DIAGNOSIS — E1151 Type 2 diabetes mellitus with diabetic peripheral angiopathy without gangrene: Secondary | ICD-10-CM

## 2023-09-15 DIAGNOSIS — D62 Acute posthemorrhagic anemia: Secondary | ICD-10-CM | POA: Diagnosis not present

## 2023-09-15 DIAGNOSIS — F418 Other specified anxiety disorders: Secondary | ICD-10-CM | POA: Diagnosis present

## 2023-09-15 DIAGNOSIS — Z7982 Long term (current) use of aspirin: Secondary | ICD-10-CM | POA: Insufficient documentation

## 2023-09-15 DIAGNOSIS — R351 Nocturia: Secondary | ICD-10-CM | POA: Diagnosis not present

## 2023-09-15 DIAGNOSIS — F32A Depression, unspecified: Secondary | ICD-10-CM | POA: Insufficient documentation

## 2023-09-15 DIAGNOSIS — F17210 Nicotine dependence, cigarettes, uncomplicated: Secondary | ICD-10-CM | POA: Diagnosis not present

## 2023-09-15 DIAGNOSIS — K31811 Angiodysplasia of stomach and duodenum with bleeding: Secondary | ICD-10-CM | POA: Diagnosis not present

## 2023-09-15 DIAGNOSIS — D509 Iron deficiency anemia, unspecified: Secondary | ICD-10-CM | POA: Diagnosis not present

## 2023-09-15 DIAGNOSIS — I739 Peripheral vascular disease, unspecified: Secondary | ICD-10-CM | POA: Diagnosis present

## 2023-09-15 DIAGNOSIS — I2510 Atherosclerotic heart disease of native coronary artery without angina pectoris: Secondary | ICD-10-CM | POA: Diagnosis present

## 2023-09-15 DIAGNOSIS — K580 Irritable bowel syndrome with diarrhea: Secondary | ICD-10-CM | POA: Insufficient documentation

## 2023-09-15 DIAGNOSIS — D649 Anemia, unspecified: Secondary | ICD-10-CM | POA: Diagnosis not present

## 2023-09-15 DIAGNOSIS — Z7902 Long term (current) use of antithrombotics/antiplatelets: Secondary | ICD-10-CM | POA: Insufficient documentation

## 2023-09-15 DIAGNOSIS — Z79899 Other long term (current) drug therapy: Secondary | ICD-10-CM | POA: Insufficient documentation

## 2023-09-15 DIAGNOSIS — I480 Paroxysmal atrial fibrillation: Secondary | ICD-10-CM | POA: Diagnosis present

## 2023-09-15 DIAGNOSIS — N390 Urinary tract infection, site not specified: Secondary | ICD-10-CM | POA: Diagnosis present

## 2023-09-15 DIAGNOSIS — I10 Essential (primary) hypertension: Secondary | ICD-10-CM | POA: Diagnosis not present

## 2023-09-15 DIAGNOSIS — K5140 Inflammatory polyps of colon without complications: Secondary | ICD-10-CM

## 2023-09-15 DIAGNOSIS — J449 Chronic obstructive pulmonary disease, unspecified: Secondary | ICD-10-CM | POA: Diagnosis not present

## 2023-09-15 DIAGNOSIS — K922 Gastrointestinal hemorrhage, unspecified: Principal | ICD-10-CM

## 2023-09-15 DIAGNOSIS — F419 Anxiety disorder, unspecified: Secondary | ICD-10-CM | POA: Insufficient documentation

## 2023-09-15 DIAGNOSIS — K921 Melena: Secondary | ICD-10-CM | POA: Diagnosis present

## 2023-09-15 DIAGNOSIS — Z96642 Presence of left artificial hip joint: Secondary | ICD-10-CM | POA: Insufficient documentation

## 2023-09-15 LAB — BASIC METABOLIC PANEL
Anion gap: 14 (ref 5–15)
BUN: 28 mg/dL — ABNORMAL HIGH (ref 8–23)
CO2: 18 mmol/L — ABNORMAL LOW (ref 22–32)
Calcium: 8.7 mg/dL — ABNORMAL LOW (ref 8.9–10.3)
Chloride: 104 mmol/L (ref 98–111)
Creatinine, Ser: 0.84 mg/dL (ref 0.44–1.00)
GFR, Estimated: >60 mL/min (ref >60)
Glucose, Bld: 138 mg/dL — ABNORMAL HIGH (ref 70–99)
Potassium: 4.1 mmol/L (ref 3.5–5.1)
Sodium: 136 mmol/L (ref 135–145)

## 2023-09-15 LAB — CBC WITH DIFFERENTIAL/PLATELET
Abs Immature Granulocytes: 0.03 10*3/uL (ref 0.00–0.07)
Basophils Absolute: 0 10*3/uL (ref 0.0–0.1)
Basophils Relative: 1 %
Eosinophils Absolute: 0.1 10*3/uL (ref 0.0–0.5)
Eosinophils Relative: 1 %
HCT: 21.6 % — ABNORMAL LOW (ref 36.0–46.0)
Hemoglobin: 6.3 g/dL — ABNORMAL LOW (ref 12.0–15.0)
Immature Granulocytes: 0 %
Lymphocytes Relative: 10 %
Lymphs Abs: 0.8 10*3/uL (ref 0.7–4.0)
MCH: 29.3 pg (ref 26.0–34.0)
MCHC: 29.2 g/dL — ABNORMAL LOW (ref 30.0–36.0)
MCV: 100.5 fL — ABNORMAL HIGH (ref 80.0–100.0)
Monocytes Absolute: 0.8 10*3/uL (ref 0.1–1.0)
Monocytes Relative: 10 %
Neutro Abs: 6 10*3/uL (ref 1.7–7.7)
Neutrophils Relative %: 78 %
Platelets: 574 10*3/uL — ABNORMAL HIGH (ref 150–400)
RBC: 2.15 MIL/uL — ABNORMAL LOW (ref 3.87–5.11)
RDW: 20.4 % — ABNORMAL HIGH (ref 11.5–15.5)
WBC: 7.8 10*3/uL (ref 4.0–10.5)
nRBC: 0 % (ref 0.0–0.2)

## 2023-09-15 LAB — IRON AND TIBC
Iron: 28 ug/dL (ref 28–170)
Saturation Ratios: 8 % — ABNORMAL LOW (ref 10.4–31.8)
TIBC: 363 ug/dL (ref 250–450)
UIBC: 335 ug/dL

## 2023-09-15 LAB — SAMPLE TO BLOOD BANK

## 2023-09-15 LAB — CBC WITH DIFFERENTIAL (CANCER CENTER ONLY)
Abs Immature Granulocytes: 0.05 10*3/uL (ref 0.00–0.07)
Basophils Absolute: 0 10*3/uL (ref 0.0–0.1)
Basophils Relative: 0 %
Eosinophils Absolute: 0.1 10*3/uL (ref 0.0–0.5)
Eosinophils Relative: 1 %
HCT: 20.7 % — ABNORMAL LOW (ref 36.0–46.0)
Hemoglobin: 6.1 g/dL — CL (ref 12.0–15.0)
Immature Granulocytes: 1 %
Lymphocytes Relative: 8 %
Lymphs Abs: 0.6 10*3/uL — ABNORMAL LOW (ref 0.7–4.0)
MCH: 28.6 pg (ref 26.0–34.0)
MCHC: 29.5 g/dL — ABNORMAL LOW (ref 30.0–36.0)
MCV: 97.2 fL (ref 80.0–100.0)
Monocytes Absolute: 0.7 10*3/uL (ref 0.1–1.0)
Monocytes Relative: 9 %
Neutro Abs: 6.5 10*3/uL (ref 1.7–7.7)
Neutrophils Relative %: 81 %
Platelet Count: 544 10*3/uL — ABNORMAL HIGH (ref 150–400)
RBC: 2.13 MIL/uL — ABNORMAL LOW (ref 3.87–5.11)
RDW: 20.5 % — ABNORMAL HIGH (ref 11.5–15.5)
WBC Count: 8 10*3/uL (ref 4.0–10.5)
nRBC: 0 % (ref 0.0–0.2)

## 2023-09-15 LAB — FERRITIN: Ferritin: 41 ng/mL (ref 11–307)

## 2023-09-15 LAB — ABO/RH: ABO/RH(D): A POS

## 2023-09-15 LAB — PREPARE RBC (CROSSMATCH)

## 2023-09-15 MED ORDER — SODIUM CHLORIDE 0.9 % IV SOLN
10.0000 mL/h | Freq: Once | INTRAVENOUS | Status: AC
  Administered 2023-09-15 – 2023-09-16 (×2): 10 mL/h via INTRAVENOUS

## 2023-09-15 MED ORDER — ONDANSETRON HCL 4 MG/2ML IJ SOLN: 4.0000 mg | Freq: Three times a day (TID) | INTRAMUSCULAR | Status: DC | PRN

## 2023-09-15 MED ORDER — PANTOPRAZOLE SODIUM 40 MG IV SOLR
40.0000 mg | Freq: Two times a day (BID) | INTRAVENOUS | Status: DC
  Administered 2023-09-15 – 2023-09-17 (×4): 40 mg via INTRAVENOUS
  Filled 2023-09-15 (×4): qty 10

## 2023-09-15 MED ORDER — NICOTINE 21 MG/24HR TD PT24
21.0000 mg | MEDICATED_PATCH | Freq: Every day | TRANSDERMAL | Status: DC
  Administered 2023-09-17: 21 mg via TRANSDERMAL
  Filled 2023-09-15: qty 1

## 2023-09-15 MED ORDER — HYDRALAZINE HCL 20 MG/ML IJ SOLN: 5.0000 mg | INTRAMUSCULAR | Status: DC | PRN

## 2023-09-15 MED ORDER — DM-GUAIFENESIN ER 30-600 MG PO TB12: 1.0000 | ORAL_TABLET | Freq: Two times a day (BID) | ORAL | Status: DC | PRN

## 2023-09-15 MED ORDER — ACETAMINOPHEN 325 MG PO TABS: 650.0000 mg | ORAL_TABLET | Freq: Four times a day (QID) | ORAL | Status: DC | PRN

## 2023-09-15 MED ORDER — ALBUTEROL SULFATE (2.5 MG/3ML) 0.083% IN NEBU: 2.5000 mg | INHALATION_SOLUTION | RESPIRATORY_TRACT | Status: DC | PRN

## 2023-09-15 NOTE — H&P (Addendum)
 History and Physical    Yolanda SCHMELZER FMW:[id number] DOB: 1945-08-25 DOA: 09/15/2023  Referring MD/NP/PA:   PCP: Cleotilde Oneil FALCON, MD   Patient coming from:  The patient is coming from home.     Chief Complaint: Tarry stool  HPI: Yolanda Jimenez is a 78 y.o. female with medical history significant of hypertension, hyperlipidemia, diet-controlled diabetes mellitus, COPD, asthma, PVD (post bilateral femoropopliteal bypass recently), CAD, depression, anxiety, tobacco abuse, anemia, A-fib not on anticoagulants (patient is on Plavix  and aspirin ), iron  deficiency anemia, small bowel AVM, IBS, C. difficile, diverticulosis, who presents with tarry stool.  She recently had a bilateral femoral endarterectomies with stent placement with Dr. Marea on 08/24/2023.  Preoperatively her hemoglobin was 10.5.  In the past 3 weeks, her hemoglobin has been persistently going down. Pt has had 2 episode of black tarry stool after the surgery. She was seen by hematologist, Dr. Clista today and found to have Hgb of 6.1. Pt was sent to ED for further evaluation and treatment. Patient denies nausea, vomiting, diarrhea or abdominal pain.  No lightheadedness or dizziness.  Denies chest pain, cough, SOB.  Patient was started on ciprofloxacin  on 2/5 due to UTI by PCP.  She states that she does not have symptoms of UTI today.  Data reviewed independently and ED Course: pt was found to have hemoglobin 6.3 (7.8 on 08/28/2023), GFR> 60.  Temperature normal, blood pressure 149/69, heart rate 74, RR 20, oxygen saturation 100% on room air.  Patient is placed on telemetry bed for observation.  Dr. Unk of GI is consulted.  EKG: I have personally reviewed.  Sinus rhythm, QTc 406, ST elevation only in V3, PAC.  Review of Systems:   General: no fevers, chills, no body weight gain, fatigue HEENT: no blurry vision, hearing changes or sore throat Respiratory: no dyspnea, coughing, wheezing CV: no chest pain, no palpitations GI: no nausea,  vomiting, abdominal pain, diarrhea, constipation. Has dark stool GU: no dysuria, burning on urination, increased urinary frequency, hematuria  Ext: has trace leg edema Neuro: no unilateral weakness, numbness, or tingling, no vision change or hearing loss Skin: no rash, no skin tear. MSK: No muscle spasm, no deformity, no limitation of range of movement in spin Heme: No easy bruising.  Travel history: No recent long distant travel.   Allergy:  Allergies  Allergen Reactions   Codeine     felt drunk, crazy   Hydrocodone Other (See Comments)   Hydrocodone-Acetaminophen  Other (See Comments)    Confusion/delirium   Lidocaine      Heart racing, increased BP - from local at dentist (likely epi)   Nsaids Other (See Comments)    Avoids due to IBS symptoms (bleeding symptoms)   Oxycodone      Confusion/delirium    Past Medical History:  Diagnosis Date   A-fib [facility])    a.) CHA2DS2-VASc = 5 (age x2, sex, vascular disease history, T2DM) as of 08/23/2023; b.) cardiac rate/rhythm maintained on oral metoprolol  succinate; no chronic OAC   Abnormal vaginal Pap smear 2015   Anxiety    a.) on BZO PRN (diazepam )   Aortic atherosclerosis (HCC)    Aortic stenosis 05/03/2023   a.) TTE 05/03/2023: mild AS (MPG 12 mmHg; AVA = 1.3 cm2)   Arthritis of spine    Asthma    Barrett esophagus    Benign hematuria 08/29/2014   W/u neg  Normal renal ultrasound 2/19  Cystoscopy normal 2019, [place]  CT -9/24     Bilateral carotid artery  disease [facility])    a.) carotid dopplers 10/13/2016, 10/24/2018, 10/26/2019, 10/21/2020, 10/22/2022: 1-39% BICA   C. difficile diarrhea    CAD (coronary artery disease)    a.) CT chest 07/12/2018: LAD calcifications; b.) CT CAP 11/18/2022: 3 vessel CAD; c.) cPET 05/19/2023: severe 4 vessel coronary calcs mainly in LAD distribution; d.) cPET 05/03/2023: small/mild reversible defect in mid-anterosep/ant region   Complication of anesthesia    a.) delayed emergence; b.) emergence  delirium; I get really loopy after surgery   COPD (chronic obstructive pulmonary disease) (HCC)    DDD (degenerative disc disease), lumbosacral    Depression    Diverticulosis    Epigastric hernia    GERD (gastroesophageal reflux disease)    Glaucoma    Heart murmur    Heart palpitations    History of bilateral cataract extraction 2015   History of recurrent UTIs    Hx of adenomatous colonic polyps    Hyperlipidemia    IBS (irritable bowel syndrome)    Insomnia    Iron  deficiency anemia    Kidney cysts    Long term current use of aspirin     Macular degeneration of left eye    Osteoporosis    a.) on RANKLi (denosumab )   PVD (peripheral vascular disease) with claudication (HCC)    Small bowel arteriovenous malformation 2007   a.) noted on VCE in 2007   Type 2 diabetes, diet controlled (HCC)    Wears dentures    partial lower    Past Surgical History:  Procedure Laterality Date   ABDOMINAL AORTIC ENDOVASCULAR STENT GRAFT N/A 08/24/2023   Procedure: ABDOMINAL AORTIC ENDOVASCULAR STENT GRAFT;  Surgeon: Marea Selinda RAMAN, MD;  Location: [facility] ORS;  Service: Vascular;  Laterality: N/A;   BUNIONECTOMY Right    CATARACT EXTRACTION Bilateral    CERVICAL CONE BIOPSY     CHOLECYSTECTOMY     COLONOSCOPY     COLONOSCOPY WITH PROPOFOL  N/A 01/22/2019   Procedure: COLONOSCOPY WITH BIOPSY;  Surgeon: Jinny Carmine, MD;  Location: [facility];  Service: Endoscopy;  Laterality: N/A;  diabetic - diet controlled   COLONOSCOPY WITH PROPOFOL  N/A 06/27/2020   Procedure: COLONOSCOPY WITH BIOPSY;  Surgeon: Jinny Carmine, MD;  Location: [facility];  Service: Endoscopy;  Laterality: N/A;  priority 4   ENDARTERECTOMY FEMORAL Bilateral 08/24/2023   Procedure: ENDARTERECTOMY FEMORAL;  Surgeon: Marea Selinda RAMAN, MD;  Location: [facility] OR[facility];  Service: Vascular;  Laterality: Bilateral;   ESOPHAGOGASTRODUODENOSCOPY (EGD) WITH PROPOFOL  N/A 08/29/2017   Procedure: ESOPHAGOGASTRODUODENOSCOPY (EGD) WITH  PROPOFOL ;  Surgeon: Jinny Carmine, MD;  Location: [facility];  Service: Endoscopy;  Laterality: N/A;   FOOT SURGERY     right 2nd toe   HIP ARTHROPLASTY Left 05/16/2018   Procedure: ARTHROPLASTY BIPOLAR HIP (HEMIARTHROPLASTY);  Surgeon: Edie Norleen PARAS, MD;  Location: [facility];  Service: Orthopedics;  Laterality: Left;   INSERTION OF ILIAC STENT Bilateral 08/24/2023   Procedure: INSERTION OF ILIAC STENT;  Surgeon: Marea Selinda RAMAN, MD;  Location: [facility];  Service: Vascular;  Laterality: Bilateral;   LOWER EXTREMITY ANGIOGRAPHY Left 04/14/2023   Procedure: Lower Extremity Angiography;  Surgeon: Marea Selinda RAMAN, MD;  Location: [facility];  Service: Cardiovascular;  Laterality: Left;   ORIF PERIPROSTHETIC FRACTURE Left 12/10/2021   Procedure: OPEN REDUCTION INTERNAL FIXATION OF A LEFT GREATER TROCHANTERIC FRACTURE;  Surgeon: Edie Norleen PARAS, MD;  Location: [facility];  Service: Orthopedics;  Laterality: Left;   ORIF WRIST FRACTURE Right 10/07/2020   Procedure: OPEN  REDUCTION INTERNAL FIXATION (ORIF) WRIST FRACTURE;  Surgeon: Kathlynn Sharper, MD;  Location: [facility];  Service: Orthopedics;  Laterality: Right;   POLYPECTOMY N/A 01/22/2019   Procedure: POLYPECTOMY;  Surgeon: Jinny Carmine, MD;  Location: [facility];  Service: Endoscopy;  Laterality: N/A;  2 Clips placed at polyp removal site in ascending colon   POLYPECTOMY N/A 06/27/2020   Procedure: POLYPECTOMY;  Surgeon: Jinny Carmine, MD;  Location: [facility];  Service: Endoscopy;  Laterality: N/A;   TUBAL LIGATION     UPPER GASTROINTESTINAL ENDOSCOPY      Social History:  reports that she has been smoking cigarettes. She has a 27.5 pack-year smoking history. She has never used smokeless tobacco. She reports current alcohol use of about 2.0 standard drinks of alcohol per week. She reports that she does not use drugs.  Family History:  Family History  Problem Relation Age of Onset   Diabetes Mother    Heart disease Mother     Hypertension Mother    Diabetes Son    Diabetes Sister    Inflammatory bowel disease Father        diverticulitis   Multiple myeloma Father    Hypertension Son    Diabetes Maternal Grandmother    Colon cancer Neg Hx    Esophageal cancer Neg Hx    Rectal cancer Neg Hx    Stomach cancer Neg Hx    Breast cancer Neg Hx      Prior to Admission medications   Medication Sig Start Date End Date Taking? Authorizing Provider  acetaminophen  (TYLENOL ) 500 MG tablet Take 1,000 mg by mouth every 6 (six) hours as needed.    [provider]  Ascorbic Acid  (VITAMIN C ) 1000 MG tablet Take 1,000 mg by mouth in the morning.    [provider]  aspirin  EC 81 MG tablet Take 1 tablet (81 mg total) by mouth daily. Swallow whole. 04/14/23 04/13/24  Marea Selinda RAMAN, MD  buPROPion  (WELLBUTRIN ) 75 MG tablet Take 75 mg by mouth every morning. 09/15/23 09/14/24  [provider]  Cholecalciferol  125 MCG (5000 UT) TABS Take 1 tablet by mouth daily.    [provider]  ciprofloxacin  (CIPRO ) 250 MG tablet Take 250 mg by mouth 2 (two) times daily. 09/14/23 09/21/23  [provider]  clopidogrel  (PLAVIX ) 75 MG tablet Take 1 tablet (75 mg total) by mouth daily. 09/03/23   Elisabeth Gwendlyn SAUNDERS, NP  cyanocobalamin  1000 MCG tablet Take 1,000 mcg by mouth daily.    [provider]  denosumab  (PROLIA ) 60 MG/ML SOSY injection Inject 60 mg into the skin every 6 (six) months. 08/10/22   [provider]  donepezil  (ARICEPT ) 5 MG tablet Take 1 tablet by mouth at bedtime. 09/14/23 09/13/24  [provider]  Ferrous Gluconate  239 (27 Fe) MG TABS Take 1 tablet by mouth daily.    [provider]  fluticasone  (FLONASE ) 50 MCG/ACT nasal spray Place 1 spray into both nostrils daily as needed for allergies or rhinitis.    [provider]  loperamide  (IMODIUM ) 2 MG capsule Take 2 mg by mouth every morning.    [provider]  metoprolol  succinate (TOPROL -XL) 25 MG 24  hr tablet Take 1 tablet by mouth daily. 04/26/23 04/25/24  [provider]  pantoprazole  (PROTONIX ) 40 MG tablet Take 40 mg by mouth daily. 09/13/23 09/12/24  [provider]  rosuvastatin  (CRESTOR ) 20 MG tablet Take 20 mg by mouth daily.    [provider]  sertraline  (ZOLOFT ) 50 MG tablet Take 1 tablet by mouth daily. 09/14/23 09/13/24  [provider]    Physical Exam: Vitals:   09/15/23 2335 09/15/23 2350 09/16/23 0100 09/16/23 0130  BP: (!) 104/57 (!) 109/58 120/60 109/61  Pulse: (!) 59 61 (!) 51 (!) 49  Resp: 18 16 17 13   Temp: 98.1 F (36.7 C) 97.9 F (36.6 C)    TempSrc: Oral Oral    SpO2:  100% 100% 100%   General: Not in acute distress.  Pale looking  HEENT:       Eyes: PERRL, EOMI, no jaundice       ENT: No discharge from the ears and nose, no pharynx injection, no tonsillar enlargement.        Neck: No JVD, no bruit, no mass felt. Heme: No neck lymph node enlargement. Cardiac: S1/S2, RRR, No gallops or rubs. Respiratory: No rales, wheezing, rhonchi or rubs. GI: Soft, nondistended, nontender, no rebound pain, no organomegaly, BS present. GU: No hematuria Ext: Has trace leg edema bilaterally. 1+DP/PT pulse bilaterally. Musculoskeletal: No joint deformities, No joint redness or warmth, no limitation of ROM in spin. Skin: No rashes.  Neuro: Alert, oriented X3, cranial nerves II-XII grossly intact, moves all extremities normally. Psych: Patient is not psychotic, no suicidal or hemocidal ideation.  Labs on Admission: I have personally reviewed following labs and imaging studies  CBC: Recent Labs  Lab 09/15/23 1317 09/15/23 1540  WBC 8.0 7.8  NEUTROABS 6.5 6.0  HGB 6.1* 6.3*  HCT 20.7* 21.6*  MCV 97.2 100.5*  PLT 544* 574*   Basic Metabolic Panel: Recent Labs  Lab 09/15/23 1540  NA 136  K 4.1  CL 104  CO2 18*  GLUCOSE 138*  BUN 28*  CREATININE 0.84  CALCIUM  8.7*   GFR: Estimated Creatinine Clearance: 37.3 mL/min (by C-G  formula based on SCr of 0.84 mg/dL). Liver Function Tests: No results for input(s): AST, ALT, ALKPHOS, BILITOT, PROT, ALBUMIN  in the last 168 hours. No results for input(s): LIPASE, AMYLASE in the last 168 hours. No results for input(s): AMMONIA in the last 168 hours. Coagulation Profile: No results for input(s): INR, PROTIME in the last 168 hours. Cardiac Enzymes: No results for input(s): CKTOTAL, CKMB, CKMBINDEX, TROPONINI in the last 168 hours. BNP (last 3 results) No results for input(s): PROBNP in the last 8760 hours. HbA1C: No results for input(s): HGBA1C in the last 72 hours. CBG: No results for input(s): GLUCAP in the last 168 hours. Lipid Profile: No results for input(s): CHOL, HDL, LDLCALC, TRIG, CHOLHDL, LDLDIRECT in the last 72 hours. Thyroid  Function Tests: No results for input(s): TSH, T4TOTAL, FREET4, T3FREE, THYROIDAB in the last 72 hours. Anemia Panel: Recent Labs    09/15/23 1317  FERRITIN 41  TIBC 363  IRON  28   Urine analysis:    Component Value Date/Time   COLORURINE STRAW (A) 08/25/2023 1521   APPEARANCEUR CLEAR (A) 08/25/2023 1521   APPEARANCEUR Clear 04/05/2023 1056   LABSPEC 1.005 08/25/2023 1521   LABSPEC 1.015 07/12/2014 2035   PHURINE 5.0 08/25/2023 1521   GLUCOSEU NEGATIVE 08/25/2023 1521   GLUCOSEU Negative 07/12/2014 2035   HGBUR NEGATIVE 08/25/2023 1521   BILIRUBINUR NEGATIVE 08/25/2023 1521   BILIRUBINUR Negative 04/05/2023 1056   BILIRUBINUR Negative 07/12/2014 2035   KETONESUR NEGATIVE 08/25/2023 1521   PROTEINUR NEGATIVE 08/25/2023 1521   NITRITE NEGATIVE 08/25/2023 1521   LEUKOCYTESUR TRACE (A) 08/25/2023 1521   LEUKOCYTESUR Trace 07/12/2014 2035   Sepsis Labs: @LABRCNTIP (procalcitonin:4,lacticidven:4) )No results  found for this or any previous visit (from the past 240 hours).   Radiological Exams on Admission:   Assessment/Plan Principal Problem:   GI  bleeding Active Problems:   Iron  deficiency anemia   Acute blood loss anemia   PAD (peripheral artery disease) (HCC)   Hyperlipidemia   HTN (hypertension)   COPD (chronic obstructive pulmonary disease) (HCC)   CAD (coronary artery disease)   PAF (paroxysmal atrial fibrillation) (HCC)   Depression with anxiety   Tobacco abuse   UTI (urinary tract infection)   Assessment and Plan:   GI bleeding, hx of iron  deficiency anemia and acute blood loss anemia:  Hgb 6.1.  Basically asymptomatic. Consulted Dr. Unk of GI by EDP.  - will place in tele bed for obs - hold ASA and plavix  - transfuse 2 units of blood now - Start IV pantoprazole , 40 mg bid - Zofran  IV for nausea - Avoid NSAIDs and SQ heparin  - Maintain IV access (2 large bore IVs if possible). - Monitor closely and follow q6h cbc, transfuse as necessary, if Hgb<7.0 - [facility]: INR, PTT and type screen  PAD (peripheral artery disease) [facility]): s/p of bilateral femoral endarterectomies with stent placement with Dr. Marea on 08/24/2023.  -crestor  -Hold ASA and plavix  temporarily. need to restart both as soon as possible when GI bleeding is controlled  Hyperlipidemia -Crestor   HTN (hypertension) -IV hydralazine  as needed -Metoprolol   COPD (chronic obstructive pulmonary disease) (HCC): Stable -Bronchodilators and as needed Mucinex   CAD (coronary artery disease) -Crestor  -Hold Plavix  and aspirin  now  PAF (paroxysmal atrial fibrillation) (HCC): Heart rate 70s -Metoprolol  -Patient not taking anticoagulants  Depression with anxiety -Continue home medications  Tobacco abuse -Nicotine  patch  UTI (urinary tract infection) -Continue home ciprofloxacin  -Repeat urinalysis -Follow-up urine culture        DVT ppx: SCD  Code Status: Full code    Family Communication:     not done, no family member is at bed side.       Disposition Plan:  Anticipate discharge back to previous environment  Consults called:  Dr. Unk of  GI  Admission status and Level of care: Telemetry Medical:    for obs   Dispo: The patient is from: Home              Anticipated d/c is to: Home              Anticipated d/c date is: 1 day              Patient currently is not medically stable to d/c.    Severity of Illness:  The appropriate patient status for this patient is OBSERVATION. Observation status is judged to be reasonable and necessary in order to provide the required intensity of service to ensure the patient's safety. The patient's presenting symptoms, physical exam findings, and initial radiographic and laboratory data in the context of their medical condition is felt to place them at decreased risk for further clinical deterioration. Furthermore, it is anticipated that the patient will be medically stable for discharge from the hospital within 2 midnights of admission.        Date of Service 09/16/2023    Caleb Exon [facility]   If 7PM-7AM, please contact night-coverage [email or web address] 09/16/2023, 1:54 AM

## 2023-09-15 NOTE — Telephone Encounter (Signed)
 Nurse from Dr. Dawson Europe office called and request this patient to be seen due to lab results. Please advise scheduling needs.    If you need to speak to nurse: [phone] at Dr. Dawson Europe office if you need her

## 2023-09-15 NOTE — ED Notes (Signed)
 EKG reading STEMI. Dr. Karlynn Oyster requesting repeat due to V3

## 2023-09-15 NOTE — ED Provider Triage Note (Signed)
 Emergency Medicine Provider Triage Evaluation Note  Yolanda Jimenez , a 78 y.o. female  was evaluated in triage.  Pt complains of abnormal labs. Patient's Hbg was 6.1. Hx of multiple blood clots removed from bilateral legs. Went to doctor for routine blood work, reports she has had to go for iron  infusions.   Review of Systems  Positive: Occasional palpitations, fatigued. dizziness Negative: Blood in stool, urine, vomit, hemoptysis  Physical Exam  There were no vitals taken for this visit. Gen:   Awake, no distress   Resp:  Normal effort  MSK:   Moves extremities without difficulty  Other:    Medical Decision Making  Medically screening exam initiated at 3:32 PM.  Appropriate orders placed.  Yolanda Jimenez was informed that the remainder of the evaluation will be completed by another provider, this initial triage assessment does not replace that evaluation, and the importance of remaining in the ED until their evaluation is complete.     Cleaster Tinnie DELENA, PA-C 09/15/23 1536

## 2023-09-15 NOTE — Progress Notes (Signed)
 [facility]  [phone] [phone]  ID: Yolanda Jimenez OB: 07-29-1946  MR#: [id number]  RDW#:[id number]  Patient Care Team: Cleotilde Oneil FALCON, MD as PCP - General (Unknown Physician Specialty) Clista Bimler, MD as Consulting Physician (Oncology)   HPI: Yolanda Jimenez is a 78 y.o. female with past medical history of A-fib, arthritis, asthma, AVM, Barrett's esophagus, carotid stenosis, COPD, diabetes, GERD, IBS, PVD referred to hematology for management of iron  deficiency anemia.  Patient reports longstanding history of iron  deficiency anemia.  She has been on for count for many years.  Recently has been going through depression due to multiple difficult events happening in her life.  As a result she stopped her iron  supplements.  She went back on it 3 to 4 days ago. Reports with her history of IBS sometimes she has voluminous and mushy stools.  After frequent wiping can notice some blood on the tissue paper at the end.   Colonoscopy from 06/27/2020 by Dr. Jinny showed multiple polyps and diverticulosis.  Pathology showed tubular adenoma x 5 and hyperplastic polyp x 1.  Follows with Dr. Penne of urology for microscopic hematuria.  Had CT scans and urine cytology which was unremarkable.Yolanda Jimenez  Has nocturia and was started on Mybetriq.    PVD - s/p bilateral femoral endarterectomies with stent placement on 08/24/2023 by Dr. Marea.  Preop hemoglobin was 10.5.  Interval history Patient was seen today as acute visit for anemia and labs. She had regular visit with Dr. Cleotilde, her PCP yesterday.  Was found to have hemoglobin of 6.6.  Today we got her in the clinic and repeat hemoglobin is down to 6.1.  She is on dual antiplatelet with aspirin  and Plavix  since the surgery.  She reports 2 episodes of dark stools after surgery.  But denies any black tarry stools or any visible bleeding.  REVIEW OF SYSTEMS:   ROS  As per HPI. Otherwise, a complete review of systems is  negative.  PAST MEDICAL HISTORY: Past Medical History:  Diagnosis Date   A-fib [facility])    a.) CHA2DS2-VASc = 5 (age x2, sex, vascular disease history, T2DM) as of 08/23/2023; b.) cardiac rate/rhythm maintained on oral metoprolol  succinate; no chronic OAC   Abnormal vaginal Pap smear 2015   Anxiety    a.) on BZO PRN (diazepam )   Aortic atherosclerosis (HCC)    Aortic stenosis 05/03/2023   a.) TTE 05/03/2023: mild AS (MPG 12 mmHg; AVA = 1.3 cm2)   Arthritis of spine    Asthma    Barrett esophagus    Benign hematuria 08/29/2014   W/u neg  Normal renal ultrasound 2/19  Cystoscopy normal 2019, [place]  CT -9/24     Bilateral carotid artery disease (HCC)    a.) carotid dopplers 10/13/2016, 10/24/2018, 10/26/2019, 10/21/2020, 10/22/2022: 1-39% BICA   C. difficile diarrhea    CAD (coronary artery disease)    a.) CT chest 07/12/2018: LAD calcifications; b.) CT CAP 11/18/2022: 3 vessel CAD; c.) cPET 05/19/2023: severe 4 vessel coronary calcs mainly in LAD distribution; d.) cPET 05/03/2023: small/mild reversible defect in mid-anterosep/ant region   Complication of anesthesia    a.) delayed emergence; b.) emergence delirium; I get really loopy after surgery   COPD (chronic obstructive pulmonary disease) (HCC)    DDD (degenerative disc disease), lumbosacral    Depression    Diverticulosis    Epigastric hernia    GERD (gastroesophageal reflux disease)    Glaucoma    Heart murmur  Heart palpitations    History of bilateral cataract extraction 2015   History of recurrent UTIs    Hx of adenomatous colonic polyps    Hyperlipidemia    IBS (irritable bowel syndrome)    Insomnia    Iron  deficiency anemia    Kidney cysts    Long term current use of aspirin     Macular degeneration of left eye    Osteoporosis    a.) on RANKLi (denosumab )   PVD (peripheral vascular disease) with claudication (HCC)    Small bowel arteriovenous malformation 2007   a.) noted on VCE in 2007   Type 2 diabetes,  diet controlled (HCC)    Wears dentures    partial lower    PAST SURGICAL HISTORY: Past Surgical History:  Procedure Laterality Date   ABDOMINAL AORTIC ENDOVASCULAR STENT GRAFT N/A 08/24/2023   Procedure: ABDOMINAL AORTIC ENDOVASCULAR STENT GRAFT;  Surgeon: Marea Selinda RAMAN, MD;  Location: [facility] ORS;  Service: Vascular;  Laterality: N/A;   BUNIONECTOMY Right    CATARACT EXTRACTION Bilateral    CERVICAL CONE BIOPSY     CHOLECYSTECTOMY     COLONOSCOPY     COLONOSCOPY WITH PROPOFOL  N/A 01/22/2019   Procedure: COLONOSCOPY WITH BIOPSY;  Surgeon: Jinny Carmine, MD;  Location: [facility];  Service: Endoscopy;  Laterality: N/A;  diabetic - diet controlled   COLONOSCOPY WITH PROPOFOL  N/A 06/27/2020   Procedure: COLONOSCOPY WITH BIOPSY;  Surgeon: Jinny Carmine, MD;  Location: [facility];  Service: Endoscopy;  Laterality: N/A;  priority 4   ENDARTERECTOMY FEMORAL Bilateral 08/24/2023   Procedure: ENDARTERECTOMY FEMORAL;  Surgeon: Marea Selinda RAMAN, MD;  Location: [facility] OR[facility];  Service: Vascular;  Laterality: Bilateral;   ESOPHAGOGASTRODUODENOSCOPY (EGD) WITH PROPOFOL  N/A 08/29/2017   Procedure: ESOPHAGOGASTRODUODENOSCOPY (EGD) WITH PROPOFOL ;  Surgeon: Jinny Carmine, MD;  Location: [facility];  Service: Endoscopy;  Laterality: N/A;   FOOT SURGERY     right 2nd toe   HIP ARTHROPLASTY Left 05/16/2018   Procedure: ARTHROPLASTY BIPOLAR HIP (HEMIARTHROPLASTY);  Surgeon: Edie Norleen PARAS, MD;  Location: [facility] OR[facility];  Service: Orthopedics;  Laterality: Left;   INSERTION OF ILIAC STENT Bilateral 08/24/2023   Procedure: INSERTION OF ILIAC STENT;  Surgeon: Marea Selinda RAMAN, MD;  Location: [facility] OR[facility];  Service: Vascular;  Laterality: Bilateral;   LOWER EXTREMITY ANGIOGRAPHY Left 04/14/2023   Procedure: Lower Extremity Angiography;  Surgeon: Marea Selinda RAMAN, MD;  Location: [facility];  Service: Cardiovascular;  Laterality: Left;   ORIF PERIPROSTHETIC FRACTURE Left 12/10/2021   Procedure: OPEN REDUCTION  INTERNAL FIXATION OF A LEFT GREATER TROCHANTERIC FRACTURE;  Surgeon: Edie Norleen PARAS, MD;  Location: [facility];  Service: Orthopedics;  Laterality: Left;   ORIF WRIST FRACTURE Right 10/07/2020   Procedure: OPEN REDUCTION INTERNAL FIXATION (ORIF) WRIST FRACTURE;  Surgeon: Kathlynn Sharper, MD;  Location: [facility];  Service: Orthopedics;  Laterality: Right;   POLYPECTOMY N/A 01/22/2019   Procedure: POLYPECTOMY;  Surgeon: Jinny Carmine, MD;  Location: [facility];  Service: Endoscopy;  Laterality: N/A;  2 Clips placed at polyp removal site in ascending colon   POLYPECTOMY N/A 06/27/2020   Procedure: POLYPECTOMY;  Surgeon: Jinny Carmine, MD;  Location: [facility];  Service: Endoscopy;  Laterality: N/A;   TUBAL LIGATION     UPPER GASTROINTESTINAL ENDOSCOPY      FAMILY HISTORY: Family History  Problem Relation Age of Onset   Diabetes Mother    Heart disease Mother    Hypertension Mother    Diabetes Son  Diabetes Sister    Inflammatory bowel disease Father        diverticulitis   Multiple myeloma Father    Hypertension Son    Diabetes Maternal Grandmother    Colon cancer Neg Hx    Esophageal cancer Neg Hx    Rectal cancer Neg Hx    Stomach cancer Neg Hx    Breast cancer Neg Hx     HEALTH MAINTENANCE: Social History   Tobacco Use   Smoking status: Every Day    Current packs/day: 0.50    Average packs/day: 0.5 packs/day for 55.0 years (27.5 ttl pk-yrs)    Types: Cigarettes   Smokeless tobacco: Never   Tobacco comments:    she has patches to start just not started yet. smoked since teenager.  Vaping Use   Vaping status: Never Used  Substance Use Topics   Alcohol use: Yes    Alcohol/week: 2.0 standard drinks of alcohol    Types: 2 Glasses of wine per week    Comment: occ wine 1-2 weekly   Drug use: No     Allergies  Allergen Reactions   Codeine     felt drunk, crazy   Hydrocodone Other (See Comments)   Hydrocodone-Acetaminophen  Other (See Comments)     Confusion/delirium   Lidocaine      Heart racing, increased BP - from local at dentist (likely epi)   Nsaids Other (See Comments)    Avoids due to IBS symptoms (bleeding symptoms)   Oxycodone      Confusion/delirium    Current Outpatient Medications  Medication Sig Dispense Refill   acetaminophen  (TYLENOL ) 500 MG tablet Take 1,000 mg by mouth every 6 (six) hours as needed.     Ascorbic Acid  (VITAMIN C ) 1000 MG tablet Take 1,000 mg by mouth in the morning.     aspirin  EC 81 MG tablet Take 1 tablet (81 mg total) by mouth daily. Swallow whole. 150 tablet 2   buPROPion  (WELLBUTRIN ) 75 MG tablet Take 75 mg by mouth every morning.     Cholecalciferol  125 MCG (5000 UT) TABS Take 1 tablet by mouth daily.     ciprofloxacin  (CIPRO ) 250 MG tablet Take 250 mg by mouth 2 (two) times daily.     clopidogrel  (PLAVIX ) 75 MG tablet Take 1 tablet (75 mg total) by mouth daily. 30 tablet 6   cyanocobalamin  1000 MCG tablet Take 1,000 mcg by mouth daily.     denosumab  (PROLIA ) 60 MG/ML SOSY injection Inject 60 mg into the skin every 6 (six) months.     donepezil  (ARICEPT ) 5 MG tablet Take 1 tablet by mouth at bedtime.     Ferrous Gluconate  239 (27 Fe) MG TABS Take 1 tablet by mouth daily.     fluticasone  (FLONASE ) 50 MCG/ACT nasal spray Place 1 spray into both nostrils daily as needed for allergies or rhinitis.     loperamide  (IMODIUM ) 2 MG capsule Take 2 mg by mouth every morning.     metoprolol  succinate (TOPROL -XL) 25 MG 24 hr tablet Take 1 tablet by mouth daily.     pantoprazole  (PROTONIX ) 40 MG tablet Take 40 mg by mouth daily.     rosuvastatin  (CRESTOR ) 20 MG tablet Take 20 mg by mouth daily.     sertraline  (ZOLOFT ) 50 MG tablet Take 1 tablet by mouth daily.     No current facility-administered medications for this visit.    OBJECTIVE: Vitals:   09/15/23 1330  BP: 109/65  Pulse: 66  Resp: 18  Temp: 97.9  F (36.6 C)      Body mass index is 18.75 kg/m.      General: Well-developed,  well-nourished, no acute distress. Eyes: Pink conjunctiva, anicteric sclera. HEENT: Normocephalic, moist mucous membranes, clear oropharnyx. Lungs: Clear to auscultation bilaterally. Heart: Regular rate and rhythm. No rubs, murmurs, or gallops. Abdomen: Soft, nontender, nondistended. No organomegaly noted, normoactive bowel sounds. Musculoskeletal: No edema, cyanosis, or clubbing. Neuro: Alert, answering all questions appropriately. Cranial nerves grossly intact. Skin: No rashes or petechiae noted. Psych: Normal affect. Lymphatics: No cervical, calvicular, axillary or inguinal LAD.   LAB RESULTS:  Lab Results  Component Value Date   NA 133 (L) 08/28/2023   K 3.4 (L) 08/28/2023   CL 99 08/28/2023   CO2 23 08/28/2023   GLUCOSE 141 (H) 08/28/2023   BUN 14 08/28/2023   CREATININE 0.66 08/31/2023   CALCIUM  8.3 (L) 08/28/2023   PROT 5.4 (L) 08/28/2023   ALBUMIN  3.0 (L) 08/28/2023   AST 20 08/28/2023   ALT 10 08/28/2023   ALKPHOS 49 08/28/2023   BILITOT 0.7 08/28/2023   GFRNONAA >60 08/31/2023   GFRAA >60 05/18/2018    Lab Results  Component Value Date   WBC 8.0 09/15/2023   NEUTROABS 6.5 09/15/2023   HGB 6.1 (LL) 09/15/2023   HCT 20.7 (L) 09/15/2023   MCV 97.2 09/15/2023   PLT 544 (H) 09/15/2023    Lab Results  Component Value Date   TIBC <676 (HH) 08/15/2023   TIBC 437 07/25/2023   TIBC 412 03/25/2023   FERRITIN 180 (H) 08/15/2023   FERRITIN 12 07/25/2023   FERRITIN 18 03/25/2023   IRONPCTSAT >97 (HH) 08/15/2023   IRONPCTSAT 3 (L) 07/25/2023   IRONPCTSAT 4 (L) 03/25/2023     STUDIES: DG Chest Port 1 View Result Date: 08/26/2023 CLINICAL DATA:  Hypoxemia EXAM: PORTABLE CHEST - 1 VIEW COMPARISON:  08/25/2023 FINDINGS: Lungs are clear. Heart size and mediastinal contours are within normal limits. Aortic Atherosclerosis (ICD10-170.0). No effusion. Visualized bones unremarkable. IMPRESSION: No acute cardiopulmonary disease. Electronically Signed   By: JONETTA Faes  M.D.   On: 08/26/2023 12:19   DG Chest Port 1 View Result Date: 08/25/2023 CLINICAL DATA:  Dyspnea EXAM: PORTABLE CHEST 1 VIEW COMPARISON:  05/15/2018, 11/18/2022 FINDINGS: The heart size and mediastinal contours are within normal limits. Aortic atherosclerosis. No focal airspace consolidation, pleural effusion, or pneumothorax. The visualized skeletal structures are unremarkable. IMPRESSION: No active disease. Electronically Signed   By: Mabel Converse D.O.   On: 08/25/2023 13:29   DG C-Arm 1-60 Min-No Report Result Date: 08/24/2023 Fluoroscopy was utilized by the requesting physician.  No radiographic interpretation.   DG C-Arm 1-60 Min-No Report Result Date: 08/24/2023 Fluoroscopy was utilized by the requesting physician.  No radiographic interpretation.    ASSESSMENT AND PLAN:   KHARISMA GLASNER is a 78 y.o. female with pmh of A-fib, arthritis, asthma, AVM, Barrett's esophagus, carotid stenosis, COPD, diabetes, GERD, IBS, PVD referred to hematology for management of iron  deficiency anemia.  # Iron  deficiency anemia -Patient has longstanding history of iron  deficiency anemia.  Outpatient she has been treated with multiple doses of IV Venofer .  -She recently had a bilateral femoral endarterectomies with stent placement with Dr. Marea on 08/24/2023.  Preoperatively her hemoglobin was 10.5.  In the past 3 weeks, her hemoglobin has been persistently going down.  Today 6.1.  Reports 2 episodes of dark stools after the surgery.  Denies any tarry stools or active blood.  Patient is asymptomatic.  However, with  hemoglobin persistently trending down and her being on dual antiplatelet with aspirin  and Plavix  I cannot rule out GI bleed.  I have recommended ER evaluation for further management.  Patient is by herself in the clinic today.  She would like to go back and back some belongings for her and will request her son to take her to the emergency room.  -She has longstanding history of microscopic  hematuria and follows with Dr. Penne.  Had CT scans and urine cytology which was negative. .  # Nocturia -Follows with Dr. Penne.  She was started on Mybertiq.    # Depression -Continue with Effexor    # PVD -Follows with Dr. Marea.  Plan for the surgery soon for bilateral common femoral artery blockage.  # IBS-diarrhea -Follows with Dr. Jinny.  On Imodium .  Last colonoscopy November 2021 as above.  No orders of the defined types were placed in this encounter.  RTC to be decided after ER discharge.  Patient expressed understanding and was in agreement with this plan. She also understands that She can call clinic at any time with any questions, concerns, or complaints.   I spent a total of 30 minutes reviewing chart data, face-to-face evaluation with the patient, counseling and coordination of care as detailed above.  Shalene Gallen, MD   09/15/2023 2:36 PM

## 2023-09-15 NOTE — ED Triage Notes (Signed)
 Pt to ED via POV from home. Pt reports was at PCP for routine blood work today and HGB was 6.1. Pt had multiple blood clots removed from both legs on 2/4. Pt is on blood thinner. Pt denies known bleeding.

## 2023-09-15 NOTE — ED Notes (Signed)
 Confirmed patient on central tele

## 2023-09-15 NOTE — ED Notes (Signed)
 Repeat EKG done and given to Dr. Karlynn Oyster

## 2023-09-15 NOTE — ED Provider Notes (Signed)
 [facility] Provider Note    Event Date/Time   First MD Initiated Contact with Patient 09/15/23 1808     (approximate)   History   No chief complaint on file.   HPI Yolanda Jimenez is a 78 y.o. female on dual antiplatelet therapy presenting today for anemia.  Patient states she had a follow-up visit with her primary care provider with labs drawn showing a hemoglobin of 6.1.  She had reported 1 episode of black and tarry stools recently and there were concern for possible GI bleed.  She does have a history of mild deficiency anemia but more concerning symptoms today as she has not been honest with the past.  She notes fatigue and shortness of breath but otherwise no chest pain, nausea, vomiting, abdominal pain, cough, congestion.  She states her stools appear darker but not black and tarry at this time.  No bright red blood per rectum.  She did have recent surgery back in January we will antiplatelet therapy since with aspirin  and Plavix .     Physical Exam   Triage Vital Signs: ED Triage Vitals [09/15/23 1534]  Encounter Vitals Group     BP 117/62     Systolic BP Percentile      Diastolic BP Percentile      Pulse Rate 74     Resp 20     Temp 98 F (36.7 C)     Temp Source Oral     SpO2 97 %     Weight      Height      Head Circumference      Peak Flow      Pain Score 0     Pain Loc      Pain Education      Exclude from Growth Chart     Most recent vital signs: Vitals:   09/15/23 1936 09/15/23 1951  BP: 119/65 111/62  Pulse: 65 67  Resp: 16 15  Temp: 97.7 F (36.5 C) 98.2 F (36.8 C)  SpO2:  100%   Physical Exam: I have reviewed the vital signs and nursing notes. General: Awake, alert, no acute distress.  Nontoxic appearing. Head:  Atraumatic, normocephalic.   ENT:  EOM intact, PERRL. Oral mucosa is pink and moist with no lesions. Neck: Neck is supple with full range of motion, No meningeal signs. Cardiovascular:  RRR, No murmurs.  Peripheral pulses palpable and equal bilaterally. Respiratory:  Symmetrical chest wall expansion.  No rhonchi, rales, or wheezes.  Good air movement throughout.  No use of accessory muscles.   Musculoskeletal:  No cyanosis or edema. Moving extremities with full ROM Abdomen:  Soft, nontender, nondistended. Rectal: Heme positive stool Neuro:  GCS 15, moving all four extremities, interacting appropriately. Speech clear. Psych:  Calm, appropriate.   Skin:  Warm, dry, no rash.    ED Results / Procedures / Treatments   Labs (all labs ordered are listed, but only abnormal results are displayed) Labs Reviewed  BASIC METABOLIC PANEL - Abnormal; Notable for the following components:      Result Value   CO2 18 (*)    Glucose, Bld 138 (*)    BUN 28 (*)    Calcium  8.7 (*)    All other components within normal limits  CBC WITH DIFFERENTIAL/PLATELET - Abnormal; Notable for the following components:   RBC 2.15 (*)    Hemoglobin 6.3 (*)    HCT 21.6 (*)    MCV 100.5 (*)  MCHC 29.2 (*)    RDW 20.4 (*)    Platelets 574 (*)    All other components within normal limits  CBC  CBC  BASIC METABOLIC PANEL  PROTIME-INR  APTT  TYPE AND SCREEN  PREPARE RBC (CROSSMATCH)     EKG My EKG interpretation: Rate of 77, normal sinus rhythm, normal axis, normal normals.  No acute ST elevations or depressions   RADIOLOGY    PROCEDURES:  Critical Care performed: Yes, see critical care procedure note(s)  .Critical Care  Performed by: Malvina Alm DASEN, MD Authorized by: Malvina Alm DASEN, MD   Critical care provider statement:    Critical care time (minutes):  30   Critical care was necessary to treat or prevent imminent or life-threatening deterioration of the following conditions: Upper GI bleed requiring blood transfusion.   Critical care was time spent personally by me on the following activities:  Development of treatment plan with patient or surrogate, discussions with consultants, evaluation of  patient's response to treatment, examination of patient, ordering and review of laboratory studies, ordering and review of radiographic studies, ordering and performing treatments and interventions, pulse oximetry, re-evaluation of patient's condition and review of old charts   I assumed direction of critical care for this patient from another provider in my specialty: no     Care discussed with: admitting provider      MEDICATIONS ORDERED IN ED: Medications  pantoprazole  (PROTONIX ) injection 40 mg (has no administration in time range)  albuterol  (PROVENTIL ) (2.5 MG/3ML) 0.083% nebulizer solution 2.5 mg (has no administration in time range)  dextromethorphan-guaiFENesin  (MUCINEX  DM) 30-600 MG per 12 hr tablet 1 tablet (has no administration in time range)  ondansetron  (ZOFRAN ) injection 4 mg (has no administration in time range)  hydrALAZINE  (APRESOLINE ) injection 5 mg (has no administration in time range)  acetaminophen  (TYLENOL ) tablet 650 mg (has no administration in time range)  nicotine  (NICODERM CQ  - dosed in mg/24 hours) patch 21 mg (21 mg Transdermal Patient Refused/Not Given 09/15/23 2007)  0.9 %  sodium chloride  infusion (10 mL/hr Intravenous New Bag/Given 09/15/23 2001)     IMPRESSION / MDM / ASSESSMENT AND PLAN / ED COURSE  I reviewed the triage vital signs and the nursing notes.                              Differential diagnosis includes, but is not limited to, upper GI bleed, lower GI chronic disease, iron  deficiency anemia  Patient's presentation is most consistent with acute presentation with potential threat to life or bodily function.  Patient is a 78 year old female presenting today for low hemoglobin at 6.1.  On arrival here her vital signs are stable and physical exam only notable for heme positive stool although it is not overtly melanotic.  Hemoglobin checked here is 6.3 with elevated BUN.  Discussed the case with GI who is concern for upper GI bleed in the setting  right now.  We will transfuse 2 units PRBC and admit to hospitalist for likely procedure tomorrow.  Hospitalist is agreed to admit patient for further care.  The patient is on the cardiac monitor to evaluate for evidence of arrhythmia and/or significant heart rate changes.     FINAL CLINICAL IMPRESSION(S) / ED DIAGNOSES   Final diagnoses:  Upper GI bleed  Anemia, unspecified type     Rx / DC Orders   ED Discharge Orders     None  Note:  This document was prepared using Dragon voice recognition software and may include unintentional dictation errors.   Malvina Alm DASEN, MD 09/15/23 2018

## 2023-09-15 NOTE — Telephone Encounter (Signed)
 Spoke with patient. Agreed to lab/md/iv iron  apt today. Apts provided. Blood transfusion 1 unit sch. Tomorrow.

## 2023-09-15 NOTE — Progress Notes (Signed)
 Critical Hgb 6.1 per Dr. DELENA.   Patient declined nurse to wheel her to the ER for critical blood transfusion. She elects to drive home and pack an overnight bag first. She will have her family meet her at the house and then family will drive her back to the ER for blood transfusion/ GI bleed work up.

## 2023-09-16 ENCOUNTER — Observation Stay: Payer: PPO | Admitting: General Practice

## 2023-09-16 ENCOUNTER — Inpatient Hospital Stay: Payer: PPO

## 2023-09-16 ENCOUNTER — Encounter: Payer: Self-pay | Admitting: Internal Medicine

## 2023-09-16 ENCOUNTER — Encounter
Admission: EM | Disposition: A | Discharge status: Home / Self Care | Payer: Self-pay | Source: Home / Self Care | Attending: Emergency Medicine

## 2023-09-16 DIAGNOSIS — K921 Melena: Secondary | ICD-10-CM | POA: Diagnosis not present

## 2023-09-16 DIAGNOSIS — K31811 Angiodysplasia of stomach and duodenum with bleeding: Secondary | ICD-10-CM

## 2023-09-16 DIAGNOSIS — D62 Acute posthemorrhagic anemia: Secondary | ICD-10-CM | POA: Diagnosis not present

## 2023-09-16 DIAGNOSIS — Q2733 Arteriovenous malformation of digestive system vessel: Secondary | ICD-10-CM | POA: Diagnosis not present

## 2023-09-16 DIAGNOSIS — K922 Gastrointestinal hemorrhage, unspecified: Principal | ICD-10-CM

## 2023-09-16 DIAGNOSIS — N390 Urinary tract infection, site not specified: Secondary | ICD-10-CM | POA: Diagnosis present

## 2023-09-16 DIAGNOSIS — I480 Paroxysmal atrial fibrillation: Secondary | ICD-10-CM | POA: Diagnosis not present

## 2023-09-16 HISTORY — PX: HOT HEMOSTASIS: SHX5433

## 2023-09-16 HISTORY — PX: ESOPHAGOGASTRODUODENOSCOPY (EGD) WITH PROPOFOL: SHX5813

## 2023-09-16 LAB — BPAM RBC
Blood Product Expiration Date: 202503092359
Blood Product Expiration Date: 202503092359
ISSUE DATE / TIME: 202502061924
ISSUE DATE / TIME: 202502062314
Unit Type and Rh: 6200
Unit Type and Rh: 6200

## 2023-09-16 LAB — CBC
HCT: 25.3 % — ABNORMAL LOW (ref 36.0–46.0)
HCT: 25.7 % — ABNORMAL LOW (ref 36.0–46.0)
Hemoglobin: 8 g/dL — ABNORMAL LOW (ref 12.0–15.0)
Hemoglobin: 7.8 g/dL — ABNORMAL LOW (ref 12.0–15.0)
MCH: 28.8 pg (ref 26.0–34.0)
MCH: 28.8 pg (ref 26.0–34.0)
MCHC: 31.6 g/dL (ref 30.0–36.0)
MCHC: 30.4 g/dL (ref 30.0–36.0)
MCV: 91 fL (ref 80.0–100.0)
MCV: 94.8 fL (ref 80.0–100.0)
Platelets: 426 10*3/uL — ABNORMAL HIGH (ref 150–400)
Platelets: 416 10*3/uL — ABNORMAL HIGH (ref 150–400)
RBC: 2.78 MIL/uL — ABNORMAL LOW (ref 3.87–5.11)
RBC: 2.71 MIL/uL — ABNORMAL LOW (ref 3.87–5.11)
RDW: 20.5 % — ABNORMAL HIGH (ref 11.5–15.5)
RDW: 20.9 % — ABNORMAL HIGH (ref 11.5–15.5)
WBC: 5.9 10*3/uL (ref 4.0–10.5)
WBC: 5.7 10*3/uL (ref 4.0–10.5)
nRBC: 0.3 % — ABNORMAL HIGH (ref 0.0–0.2)
nRBC: 0 % (ref 0.0–0.2)

## 2023-09-16 LAB — TYPE AND SCREEN
ABO/RH(D): A POS
Antibody Screen: NEGATIVE
Unit division: 0
Unit division: 0

## 2023-09-16 LAB — CBG MONITORING, ED: Glucose-Capillary: 122 mg/dL — ABNORMAL HIGH (ref 70–99)

## 2023-09-16 LAB — BASIC METABOLIC PANEL
Anion gap: 10 (ref 5–15)
BUN: 24 mg/dL — ABNORMAL HIGH (ref 8–23)
CO2: 20 mmol/L — ABNORMAL LOW (ref 22–32)
Calcium: 8.1 mg/dL — ABNORMAL LOW (ref 8.9–10.3)
Chloride: 108 mmol/L (ref 98–111)
Creatinine, Ser: 0.69 mg/dL (ref 0.44–1.00)
GFR, Estimated: >60 mL/min (ref >60)
Glucose, Bld: 96 mg/dL (ref 70–99)
Potassium: 4.1 mmol/L (ref 3.5–5.1)
Sodium: 138 mmol/L (ref 135–145)

## 2023-09-16 LAB — VAS US ABI WITH/WO TBI
Left ABI: 1.11
Right ABI: 1.14

## 2023-09-16 LAB — APTT: aPTT: 26 s (ref 24–36)

## 2023-09-16 LAB — PROTIME-INR
INR: 1.2 (ref 0.8–1.2)
Prothrombin Time: 15.4 s — ABNORMAL HIGH (ref 11.4–15.2)

## 2023-09-16 SURGERY — ESOPHAGOGASTRODUODENOSCOPY (EGD) WITH PROPOFOL
Anesthesia: General

## 2023-09-16 MED ORDER — ENSURE ENLIVE PO LIQD
237.0000 mL | Freq: Two times a day (BID) | ORAL | Status: DC
  Administered 2023-09-17: 237 mL via ORAL

## 2023-09-16 MED ORDER — ROSUVASTATIN CALCIUM 10 MG PO TABS
20.0000 mg | ORAL_TABLET | Freq: Every day | ORAL | Status: DC
  Administered 2023-09-17: 20 mg via ORAL
  Filled 2023-09-16: qty 2
  Filled 2023-09-16: qty 1

## 2023-09-16 MED ORDER — BUPROPION HCL 75 MG PO TABS
75.0000 mg | ORAL_TABLET | Freq: Every morning | ORAL | Status: DC
  Administered 2023-09-16 – 2023-09-17 (×2): 75 mg via ORAL
  Filled 2023-09-16 (×2): qty 1

## 2023-09-16 MED ORDER — VITAMIN B-12 1000 MCG PO TABS
1000.0000 ug | ORAL_TABLET | Freq: Every day | ORAL | Status: DC
  Administered 2023-09-17: 1000 ug via ORAL
  Filled 2023-09-16: qty 2
  Filled 2023-09-16: qty 1

## 2023-09-16 MED ORDER — DONEPEZIL HCL 5 MG PO TABS
5.0000 mg | ORAL_TABLET | Freq: Every day | ORAL | Status: DC
  Administered 2023-09-16: 5 mg via ORAL
  Filled 2023-09-16: qty 1

## 2023-09-16 MED ORDER — VITAMIN D 25 MCG (1000 UNIT) PO TABS
5000.0000 [IU] | ORAL_TABLET | Freq: Every day | ORAL | Status: DC
  Administered 2023-09-17: 5000 [IU] via ORAL
  Filled 2023-09-16 (×2): qty 5

## 2023-09-16 MED ORDER — VITAMIN C 500 MG PO TABS
1000.0000 mg | ORAL_TABLET | Freq: Every morning | ORAL | Status: DC
Start: 2023-09-16 — End: 2023-09-16

## 2023-09-16 MED ORDER — FERROUS GLUCONATE 324 (38 FE) MG PO TABS
324.0000 mg | ORAL_TABLET | Freq: Every day | ORAL | Status: DC
  Administered 2023-09-16 – 2023-09-17 (×2): 324 mg via ORAL
  Filled 2023-09-16 (×2): qty 1

## 2023-09-16 MED ORDER — PROPOFOL 10 MG/ML IV BOLUS
INTRAVENOUS | Status: DC | PRN
  Administered 2023-09-16: 20 mg via INTRAVENOUS
  Administered 2023-09-16: 10 mg via INTRAVENOUS
  Administered 2023-09-16: 20 mg via INTRAVENOUS
  Administered 2023-09-16: 70 mg via INTRAVENOUS
  Administered 2023-09-16: 20 mg via INTRAVENOUS

## 2023-09-16 MED ORDER — FLUTICASONE PROPIONATE 50 MCG/ACT NA SUSP: 1.0000 | Freq: Every day | NASAL | Status: DC | PRN

## 2023-09-16 MED ORDER — SERTRALINE HCL 50 MG PO TABS: 50.0000 mg | ORAL_TABLET | Freq: Every day | ORAL | Status: DC

## 2023-09-16 MED ORDER — CIPROFLOXACIN HCL 500 MG PO TABS
250.0000 mg | ORAL_TABLET | Freq: Two times a day (BID) | ORAL | Status: DC
  Administered 2023-09-16 – 2023-09-17 (×3): 250 mg via ORAL
  Filled 2023-09-16 (×3): qty 1

## 2023-09-16 MED ORDER — SODIUM CHLORIDE 0.9 % IV SOLN: INTRAVENOUS | Status: DC | PRN

## 2023-09-16 MED ORDER — LOPERAMIDE HCL 2 MG PO CAPS: 2.0000 mg | ORAL_CAPSULE | ORAL | Status: DC | PRN

## 2023-09-16 MED ORDER — PANTOPRAZOLE SODIUM 40 MG PO TBEC: 40.0000 mg | DELAYED_RELEASE_TABLET | Freq: Every day | ORAL | Status: DC

## 2023-09-16 MED ORDER — METOPROLOL SUCCINATE ER 25 MG PO TB24
25.0000 mg | ORAL_TABLET | Freq: Every day | ORAL | Status: DC
  Administered 2023-09-17: 25 mg via ORAL
  Filled 2023-09-16: qty 1

## 2023-09-16 MED ORDER — DEXMEDETOMIDINE HCL IN NACL 80 MCG/20ML IV SOLN
INTRAVENOUS | Status: DC | PRN
  Administered 2023-09-16: 4 ug via INTRAVENOUS

## 2023-09-16 NOTE — Anesthesia Postprocedure Evaluation (Signed)
 Anesthesia Post Note  Patient: Yolanda Jimenez  Procedure(s) Performed: ESOPHAGOGASTRODUODENOSCOPY (EGD) WITH PROPOFOL  HOT HEMOSTASIS (ARGON PLASMA COAGULATION/BICAP)  Patient location during evaluation: Endoscopy Anesthesia Type: General Level of consciousness: awake and alert Pain management: pain level controlled Vital Signs Assessment: post-procedure vital signs reviewed and stable Respiratory status: spontaneous breathing, nonlabored ventilation, respiratory function stable and patient connected to nasal cannula oxygen Cardiovascular status: blood pressure returned to baseline and stable Postop Assessment: no apparent nausea or vomiting Anesthetic complications: no  No notable events documented.   Last Vitals:  Vitals:   09/16/23 1240 09/16/23 1250  BP: (!) 117/54 125/60  Pulse: (!) 58 (!) 57  Resp: 18   Temp:    SpO2: 98% 100%    Last Pain:  Vitals:   09/16/23 1140  TempSrc: Temporal  PainSc:                  Debby [place]

## 2023-09-16 NOTE — Progress Notes (Signed)
 PROGRESS NOTE    Yolanda Jimenez  FMW:[id number] DOB: 17-Dec-1945 DOA: 09/15/2023 PCP: Cleotilde Oneil FALCON, MD  No chief complaint on file.   Hospital Course:  Yolanda Jimenez is 78 y.o. female with hypertension, hyperlipidemia, diet-controlled diabetes, COPD, asthma, PVD status post bilateral femoral-popliteal bypass on aspirin  Plavix ,, CAD, depression, anxiety, tobacco abuse, iron  deficiency anemia, A-fib not on anticoagulants, small bowel AVM, IBS, C. difficile, diverticulosis, who presents with tarry stools. Patient had bilateral femoral endarterectomies with stent placement on 08/24/2023.  Postoperatively her hemoglobin was 10.5.  Over the last 3 weeks hemoglobin has been downtrending.  She has had 2 episodes of black tarry stools after the surgery.  She was seen by her hematologist today and found to have a hemoglobin of 6.1.  She was into the ED for further evaluation. Outpatient she was also started on ciprofloxacin  on 2/5 due to UTI.  On arrival to the ED she was found to have a hemoglobin of 6.3, all other vitals within normal limits.  Dr. Unk of GI was consulted.     Subjective: Patient evaluated after endoscopy. She has tolerated lunch and is feeling much better. Has had three urgent Bms today, not dark.  Objective: Vitals:   09/16/23 0500 09/16/23 0506 09/16/23 0600 09/16/23 0700  BP: 115/70  115/65 (!) 116/96  Pulse: 69  (!) 53 67  Resp: 16  15   Temp:  97.8 F (36.6 C)    TempSrc:  Oral    SpO2: 100%  100% 100%   No intake or output data in the 24 hours ending 09/16/23 0736 There were no vitals filed for this visit.  Examination: General exam: Appears calm and comfortable, NAD  Respiratory system: No work of breathing, symmetric chest wall expansion Cardiovascular system: S1 & S2 heard, RRR.  Gastrointestinal system: Abdomen is nondistended, soft and nontender.  Neuro: Alert and oriented. No focal neurological deficits. Extremities: Symmetric, expected ROM Skin: No  rashes, lesions Psychiatry: Demonstrates appropriate judgement and insight. Mood & affect appropriate for situation.   Assessment & Plan:  Principal Problem:   GI bleeding Active Problems:   Iron  deficiency anemia   Acute blood loss anemia   PAD (peripheral artery disease) (HCC)   Hyperlipidemia   HTN (hypertension)   COPD (chronic obstructive pulmonary disease) (HCC)   CAD (coronary artery disease)   PAF (paroxysmal atrial fibrillation) (HCC)   Depression with anxiety   Tobacco abuse   UTI (urinary tract infection)    GI bleed History of iron  deficiency anemia Acute blood loss anemia History of small bowel AVM and IBS History of diverticulosis - Hemoglobin 6.1 and asymptomatic on arrival - GI has been consulted by EDP - 2/7 EGD: Multiple small angiodysplastic lesions with stigmata of recent bleeding were found in the entire duodenum, coagulation for hemostasis was successful. - Continue on telemetry - Hold aspirin  and Plavix  for now - 2 units PRBCs ordered  -> Hgb7.8 - Continue IV PPI twice daily - Avoid NSAIDs and heparin  - Maintain 2 large-bore IVs - Repeat CBC daily  PAD - Status post bilateral femoral endarterectomies with stent placement on 1/15 - Has been on dual antiplatelet therapy, will have to restart this soon as possible given high risk of in-stent thrombosis. - Continue statin  Peripheral vascular disease - Status post bilateral femoral-popliteal bypass - Dual antiplatelet therapy as above  Hyperlipidemia - Statin  Hypertension - Continue metoprolol , titrate as needed  COPD, not currently in exacerbation - Stable - As needed  DuoNebs  CAD - Statin - Hold DAPT  Paroxysmal A-fib - Rate currently controlled.  Continue metoprolol  - Not on anticoagulation, is on DAPT as above  Depression with anxiety - Continue home meds  Tobacco abuse - Has been counseled on cessation - Continue nicotine  patch  Urinary tract infection - Continue home  dose Cipro  - Culture ordered, follow  DVT prophylaxis: Hold all AC for now. SCDs   Code Status: Full Code Family Communication:  Discussed directly with patient Disposition:  Observation. Still hospitalized for Hgb tending, will discharge to home when stable. Hopefully tomorrow AM  Consultants:  [facility] - Dr. Jinny  Procedures:  EGD 2/7  Antimicrobials:  Anti-infectives (From admission, onward)    Start     Dose/Rate Route Frequency Ordered Stop   09/16/23 0800  ciprofloxacin  (CIPRO ) tablet 250 mg        250 mg Oral 2 times daily 09/16/23 0149 09/22/23 0759       Data Reviewed: I have personally reviewed following labs and imaging studies CBC: Recent Labs  Lab 09/15/23 1317 09/15/23 1540 09/16/23 0219 09/16/23 0450  WBC 8.0 7.8 5.9 5.7  NEUTROABS 6.5 6.0  --   --   HGB 6.1* 6.3* 8.0* 7.8*  HCT 20.7* 21.6* 25.3* 25.7*  MCV 97.2 100.5* 91.0 94.8  PLT 544* 574* 426* 416*   Basic Metabolic Panel: Recent Labs  Lab 09/15/23 1540 09/16/23 0450  NA 136 138  K 4.1 4.1  CL 104 108  CO2 18* 20*  GLUCOSE 138* 96  BUN 28* 24*  CREATININE 0.84 0.69  CALCIUM  8.7* 8.1*   GFR: Estimated Creatinine Clearance: 39.2 mL/min (by C-G formula based on SCr of 0.69 mg/dL). Liver Function Tests: No results for input(s): AST, ALT, ALKPHOS, BILITOT, PROT, ALBUMIN  in the last 168 hours. CBG: No results for input(s): GLUCAP in the last 168 hours.  No results found for this or any previous visit (from the past 240 hours).   Radiology Studies: No results found.  Scheduled Meds:  buPROPion   75 mg Oral q morning   cholecalciferol   5,000 Units Oral Daily   ciprofloxacin   250 mg Oral BID   cyanocobalamin   1,000 mcg Oral Daily   donepezil   5 mg Oral QHS   ferrous gluconate   324 mg Oral Daily   metoprolol  succinate  25 mg Oral Daily   nicotine   21 mg Transdermal Daily   pantoprazole  (PROTONIX ) IV  40 mg Intravenous Q12H   rosuvastatin   20 mg Oral Daily   Continuous  Infusions:   LOS: 0 days    Total time spent coordinating care:   Lorane Poland, DO [facility]  To contact the attending physician between 7A-7P please use Epic Chat. To contact the covering physician during after hours 7P-7A, please review Amion.   09/16/2023, 7:36 AM   *This document has been created with the assistance of dictation software. Please excuse typographical errors. *

## 2023-09-16 NOTE — ED Notes (Signed)
Report given to endoscopy nurse.

## 2023-09-16 NOTE — Transfer of Care (Signed)
 Immediate Anesthesia Transfer of Care Note  Patient: Yolanda Jimenez  Procedure(s) Performed: ESOPHAGOGASTRODUODENOSCOPY (EGD) WITH PROPOFOL  HOT HEMOSTASIS (ARGON PLASMA COAGULATION/BICAP)  Patient Location: PACU and Endoscopy Unit  Anesthesia Type:General  Level of Consciousness: drowsy and patient cooperative  Airway & Oxygen Therapy: Patient Spontanous Breathing  Post-op Assessment: Report given to RN and Post -op Vital signs reviewed and stable  Post vital signs: Reviewed and stable  Last Vitals:  Vitals Value Taken Time  BP    Temp    Pulse    Resp    SpO2      Last Pain:  Vitals:   09/16/23 1047  TempSrc: Temporal  PainSc:          Complications: No notable events documented.

## 2023-09-16 NOTE — Consult Note (Signed)
 Yolanda Copping, MD [facility]  [address] [place], [place] [address] Phone: [phone] Fax : [phone]  Consultation  Referring Provider:     Dr. Malvina Primary Care Physician:  Cleotilde Oneil FALCON, MD Primary Gastroenterologist:  Dr. Copping         Reason for Consultation:     Iron  deficiency anemia     Date of Admission:  09/15/2023 Date of Consultation:  09/16/2023         HPI:   Yolanda Jimenez is a 78 y.o. female who has a history of colon polyps with her last colonoscopy in 2021.  At that time the patient had colon polyps and diverticulosis found.  The polyps removed showed some of the polyps to be adenomatous polyps.  The patient also had an upper endoscopy in 2019 for a history of Barrett's esophagus without Barrett's esophagus being seen and only mild esophagitis was seen at that time.  Prior to her colonoscopy in 2021 she had a 15 mm ascending colon polyp that was removed in June 2020. The patient had come to the emergency department due to black tarry stools.  The patient has a history of diabetes controlled by diet COPD, asthma, peripheral vascular disease, coronary artery disease, anxiety, depression, hypertension, hyperlipidemia and A-fib.  The patient also has a history of small bowel AVMs, IBS and has had C. difficile in the past.  The patient is on Plavix  and aspirin .  In January of this year the patient underwent bilateral femoral endarterectomies with stent placements and had a hemoglobin of 10.5 at that time.  The patient reports 2 episodes of black tarry stools after surgery and was followed by hematology.  The patient's hemoglobin 3 weeks ago was 7.9 and she came in yesterday with a hemoglobin of 6.1.  The patient's hemoglobin was 8.0 after transfusions and was 7.8 this morning.   Past Medical History:  Diagnosis Date   A-fib [facility])    a.) CHA2DS2-VASc = 5 (age x2, sex, vascular disease history, T2DM) as of 08/23/2023; b.) cardiac rate/rhythm maintained on oral  metoprolol  succinate; no chronic OAC   Abnormal vaginal Pap smear 2015   Anxiety    a.) on BZO PRN (diazepam )   Aortic atherosclerosis (HCC)    Aortic stenosis 05/03/2023   a.) TTE 05/03/2023: mild AS (MPG 12 mmHg; AVA = 1.3 cm2)   Arthritis of spine    Asthma    Barrett esophagus    Benign hematuria 08/29/2014   W/u neg  Normal renal ultrasound 2/19  Cystoscopy normal 2019, [place]  CT -9/24     Bilateral carotid artery disease (HCC)    a.) carotid dopplers 10/13/2016, 10/24/2018, 10/26/2019, 10/21/2020, 10/22/2022: 1-39% BICA   C. difficile diarrhea    CAD (coronary artery disease)    a.) CT chest 07/12/2018: LAD calcifications; b.) CT CAP 11/18/2022: 3 vessel CAD; c.) cPET 05/19/2023: severe 4 vessel coronary calcs mainly in LAD distribution; d.) cPET 05/03/2023: small/mild reversible defect in mid-anterosep/ant region   Complication of anesthesia    a.) delayed emergence; b.) emergence delirium; I get really loopy after surgery   COPD (chronic obstructive pulmonary disease) (HCC)    DDD (degenerative disc disease), lumbosacral    Depression    Diverticulosis    Epigastric hernia    GERD (gastroesophageal reflux disease)    Glaucoma    Heart murmur    Heart palpitations    History of bilateral cataract extraction 2015   History  of recurrent UTIs    Hx of adenomatous colonic polyps    Hyperlipidemia    IBS (irritable bowel syndrome)    Insomnia    Iron  deficiency anemia    Kidney cysts    Long term current use of aspirin     Macular degeneration of left eye    Osteoporosis    a.) on RANKLi (denosumab )   PVD (peripheral vascular disease) with claudication (HCC)    Small bowel arteriovenous malformation 2007   a.) noted on VCE in 2007   Type 2 diabetes, diet controlled (HCC)    Wears dentures    partial lower    Past Surgical History:  Procedure Laterality Date   ABDOMINAL AORTIC ENDOVASCULAR STENT GRAFT N/A 08/24/2023   Procedure: ABDOMINAL AORTIC ENDOVASCULAR  STENT GRAFT;  Surgeon: Marea Selinda RAMAN, MD;  Location: [facility];  Service: Vascular;  Laterality: N/A;   BUNIONECTOMY Right    CATARACT EXTRACTION Bilateral    CERVICAL CONE BIOPSY     CHOLECYSTECTOMY     COLONOSCOPY     COLONOSCOPY WITH PROPOFOL  N/A 01/22/2019   Procedure: COLONOSCOPY WITH BIOPSY;  Surgeon: Jinny Carmine, MD;  Location: [facility];  Service: Endoscopy;  Laterality: N/A;  diabetic - diet controlled   COLONOSCOPY WITH PROPOFOL  N/A 06/27/2020   Procedure: COLONOSCOPY WITH BIOPSY;  Surgeon: Jinny Carmine, MD;  Location: [facility];  Service: Endoscopy;  Laterality: N/A;  priority 4   ENDARTERECTOMY FEMORAL Bilateral 08/24/2023   Procedure: ENDARTERECTOMY FEMORAL;  Surgeon: Marea Selinda RAMAN, MD;  Location: [facility];  Service: Vascular;  Laterality: Bilateral;   ESOPHAGOGASTRODUODENOSCOPY (EGD) WITH PROPOFOL  N/A 08/29/2017   Procedure: ESOPHAGOGASTRODUODENOSCOPY (EGD) WITH PROPOFOL ;  Surgeon: Jinny Carmine, MD;  Location: [facility];  Service: Endoscopy;  Laterality: N/A;   FOOT SURGERY     right 2nd toe   HIP ARTHROPLASTY Left 05/16/2018   Procedure: ARTHROPLASTY BIPOLAR HIP (HEMIARTHROPLASTY);  Surgeon: Edie Norleen PARAS, MD;  Location: [facility];  Service: Orthopedics;  Laterality: Left;   INSERTION OF ILIAC STENT Bilateral 08/24/2023   Procedure: INSERTION OF ILIAC STENT;  Surgeon: Marea Selinda RAMAN, MD;  Location: [facility];  Service: Vascular;  Laterality: Bilateral;   LOWER EXTREMITY ANGIOGRAPHY Left 04/14/2023   Procedure: Lower Extremity Angiography;  Surgeon: Marea Selinda RAMAN, MD;  Location: [facility];  Service: Cardiovascular;  Laterality: Left;   ORIF PERIPROSTHETIC FRACTURE Left 12/10/2021   Procedure: OPEN REDUCTION INTERNAL FIXATION OF A LEFT GREATER TROCHANTERIC FRACTURE;  Surgeon: Edie Norleen PARAS, MD;  Location: [facility];  Service: Orthopedics;  Laterality: Left;   ORIF WRIST FRACTURE Right 10/07/2020   Procedure: OPEN REDUCTION INTERNAL FIXATION (ORIF)  WRIST FRACTURE;  Surgeon: Kathlynn Sharper, MD;  Location: [facility];  Service: Orthopedics;  Laterality: Right;   POLYPECTOMY N/A 01/22/2019   Procedure: POLYPECTOMY;  Surgeon: Jinny Carmine, MD;  Location: [facility];  Service: Endoscopy;  Laterality: N/A;  2 Clips placed at polyp removal site in ascending colon   POLYPECTOMY N/A 06/27/2020   Procedure: POLYPECTOMY;  Surgeon: Jinny Carmine, MD;  Location: [facility];  Service: Endoscopy;  Laterality: N/A;   TUBAL LIGATION     UPPER GASTROINTESTINAL ENDOSCOPY      Prior to Admission medications   Medication Sig Start Date End Date Taking? Authorizing Provider  acetaminophen  (TYLENOL ) 500 MG tablet Take 1,000 mg by mouth every 6 (six) hours as needed.   Yes [provider]  aspirin  EC 81 MG tablet Take 1 tablet (81  mg total) by mouth daily. Swallow whole. 04/14/23 04/13/24 Yes Dew, Selinda RAMAN, MD  buPROPion  (WELLBUTRIN ) 75 MG tablet Take 75 mg by mouth every morning. 09/15/23 09/14/24 Yes [provider]  Cholecalciferol  125 MCG (5000 UT) TABS Take 1 tablet by mouth daily.   Yes [provider]  ciprofloxacin  (CIPRO ) 250 MG tablet Take 250 mg by mouth 2 (two) times daily. 09/14/23 09/21/23 Yes [provider]  clopidogrel  (PLAVIX ) 75 MG tablet Take 1 tablet (75 mg total) by mouth daily. 09/03/23  Yes Pace, Brien R, NP  cyanocobalamin  1000 MCG tablet Take 1,000 mcg by mouth daily.   Yes [provider]  denosumab  (PROLIA ) 60 MG/ML SOSY injection Inject 60 mg into the skin every 6 (six) months. 08/10/22  Yes [provider]  donepezil  (ARICEPT ) 5 MG tablet Take 1 tablet by mouth at bedtime. 09/14/23 09/13/24 Yes [provider]  Ferrous Gluconate  239 (27 Fe) MG TABS Take 1 tablet by mouth daily.   Yes [provider]  fluticasone  (FLONASE ) 50 MCG/ACT nasal spray Place 1 spray into both nostrils daily as needed for allergies or rhinitis.   Yes [provider]  loperamide   (IMODIUM ) 2 MG capsule Take 2 mg by mouth every morning.   Yes [provider]  metoprolol  succinate (TOPROL -XL) 25 MG 24 hr tablet Take 1 tablet by mouth daily. 04/26/23 04/25/24 Yes [provider]  pantoprazole  (PROTONIX ) 40 MG tablet Take 40 mg by mouth daily. 09/13/23 09/12/24 Yes [provider]  rosuvastatin  (CRESTOR ) 20 MG tablet Take 20 mg by mouth daily.   Yes [provider]  Ascorbic Acid  (VITAMIN C ) 1000 MG tablet Take 1,000 mg by mouth in the morning. Patient not taking: Reported on 09/16/2023    [provider]  sertraline  (ZOLOFT ) 50 MG tablet Take 1 tablet by mouth daily. Patient not taking: Reported on 09/16/2023 09/14/23 09/13/24  [provider]    Family History  Problem Relation Age of Onset   Diabetes Mother    Heart disease Mother    Hypertension Mother    Diabetes Son    Diabetes Sister    Inflammatory bowel disease Father        diverticulitis   Multiple myeloma Father    Hypertension Son    Diabetes Maternal Grandmother    Colon cancer Neg Hx    Esophageal cancer Neg Hx    Rectal cancer Neg Hx    Stomach cancer Neg Hx    Breast cancer Neg Hx      Social History   Tobacco Use   Smoking status: Every Day    Current packs/day: 0.50    Average packs/day: 0.5 packs/day for 55.0 years (27.5 ttl pk-yrs)    Types: Cigarettes   Smokeless tobacco: Never   Tobacco comments:    she has patches to start just not started yet. smoked since teenager.  Vaping Use   Vaping status: Never Used  Substance Use Topics   Alcohol use: Yes    Alcohol/week: 2.0 standard drinks of alcohol    Types: 2 Glasses of wine per week    Comment: occ wine 1-2 weekly   Drug use: No    Allergies as of 09/15/2023 - Review Complete 09/15/2023  Allergen Reaction Noted   Codeine  08/23/2008   Hydrocodone Other (See Comments) 10/14/2022   Hydrocodone-acetaminophen  Other (See Comments) 04/25/2015   Lidocaine   09/14/2018   Nsaids Other (See  Comments) 05/15/2018   Oxycodone   10/21/2020  Review of Systems:    All systems reviewed and negative except where noted in HPI.   Physical Exam:  Vital signs in last 24 hours: Temp:  [97.7 F (36.5 C)-98.2 F (36.8 C)] 97.8 F (36.6 C) (02/07 0506) Pulse Rate:  [49-78] 67 (02/07 0700) Resp:  [13-20] 15 (02/07 0600) BP: (100-149)/(51-96) 116/96 (02/07 0700) SpO2:  [97 %-100 %] 100 % (02/07 0700) Weight:  [43.5 kg] 43.5 kg (02/06 1342)   General:   Pleasant, cooperative in NAD Head:  Normocephalic and atraumatic. Eyes:   No icterus.   Conjunctiva pink. PERRLA. Ears:  Normal auditory acuity. Neck:  Supple; no masses or thyroidomegaly Lungs: Respirations even and unlabored. Lungs clear to auscultation bilaterally.   No wheezes, crackles, or rhonchi.  Heart:  Regular rate and rhythm;  Without murmur, clicks, rubs or gallops Abdomen:  Soft, nondistended, nontender. Normal bowel sounds. No appreciable masses or hepatomegaly.  No rebound or guarding.  Rectal:  Not performed. Msk:  Symmetrical without gross deformities.   Extremities:  Without edema, cyanosis or clubbing. Neurologic:  Alert and oriented x3;  grossly normal neurologically. Skin:  Intact without significant lesions or rashes. Cervical Nodes:  No significant cervical adenopathy. Psych:  Alert and cooperative. Normal affect.  LAB RESULTS: Recent Labs    09/15/23 1540 09/16/23 0219 09/16/23 0450  WBC 7.8 5.9 5.7  HGB 6.3* 8.0* 7.8*  HCT 21.6* 25.3* 25.7*  PLT 574* 426* 416*   BMET Recent Labs    09/15/23 1540 09/16/23 0450  NA 136 138  K 4.1 4.1  CL 104 108  CO2 18* 20*  GLUCOSE 138* 96  BUN 28* 24*  CREATININE 0.84 0.69  CALCIUM  8.7* 8.1*   LFT No results for input(s): PROT, ALBUMIN , AST, ALT, ALKPHOS, BILITOT, BILIDIR, IBILI in the last 72 hours. PT/INR Recent Labs    09/16/23 0450  LABPROT 15.4*  INR 1.2    STUDIES: No results found.    Impression / Plan:    Assessment: Principal Problem:   GI bleeding Active Problems:   Hyperlipidemia   Iron  deficiency anemia   PAD (peripheral artery disease) (HCC)   Tobacco abuse   Acute blood loss anemia   Depression with anxiety   HTN (hypertension)   COPD (chronic obstructive pulmonary disease) (HCC)   CAD (coronary artery disease)   PAF (paroxysmal atrial fibrillation) (HCC)   UTI (urinary tract infection)   Yolanda Jimenez is a 78 y.o. y/o female with a history of colon polyps and was reported to have dark stools.  The patient denies any overt bleeding and is on Plavix  and aspirin .  The patient has had recent femoral stent placements and needs anticoagulation.  The patient has been n.p.o. overnight.  Plan:  The patient will be set up for an EGD for today.  The patient has been explained the risks benefits and alternatives and agrees to having the procedure done today.  Thank you for involving me in the care of this patient.      LOS: 0 days   Yolanda Copping, MD, MD. NOLIA 09/16/2023, 7:44 AM,  Pager [phone] 7am-5pm  Check AMION for 5pm -7am coverage and on weekends   Note: This dictation was prepared with Dragon dictation along with smaller phrase technology. Any transcriptional errors that result from this process are unintentional.

## 2023-09-16 NOTE — Anesthesia Preprocedure Evaluation (Signed)
 Anesthesia Evaluation  Patient identified by MRN, date of birth, ID band Patient awake    Reviewed: Allergy & Precautions, NPO status , Patient's Chart, lab work & pertinent test results  History of Anesthesia Complications (+) PONV and history of anesthetic complications  Airway Mallampati: II  TM Distance: >3 FB Neck ROM: full    Dental  (+) Chipped, Dental Advidsory Given   Pulmonary COPD,  COPD inhaler, Current Smoker and Patient abstained from smoking.   Pulmonary exam normal        Cardiovascular + CAD and + Peripheral Vascular Disease  + dysrhythmias Atrial Fibrillation + Valvular Problems/Murmurs AS      Neuro/Psych  PSYCHIATRIC DISORDERS Anxiety Depression    negative neurological ROS     GI/Hepatic Neg liver ROS,GERD  Medicated and Controlled,,  Endo/Other  negative endocrine ROSdiabetes    Renal/GU Renal disease     Musculoskeletal   Abdominal   Peds  Hematology  (+) Blood dyscrasia, anemia   Anesthesia Other Findings Past Medical History: No date: A-fib [facility])     Comment:  a.) CHA2DS2-VASc = 5 (age x2, sex, vascular disease               history, T2DM) as of 08/23/2023; b.) cardiac rate/rhythm               maintained on oral metoprolol  succinate; no chronic OAC 2015: Abnormal vaginal Pap smear No date: Anxiety     Comment:  a.) on BZO PRN (diazepam ) No date: Aortic atherosclerosis (HCC) 05/03/2023: Aortic stenosis     Comment:  a.) TTE 05/03/2023: mild AS (MPG 12 mmHg; AVA = 1.3 cm2) 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 difficile diarrhea No  date: CAD (coronary artery disease)     Comment:  a.) CT chest 07/12/2018: LAD calcifications; b.) CT CAP               11/18/2022: 3 vessel CAD; c.) cPET 05/19/2023: severe 4               vessel coronary calcs mainly in LAD distribution; d.)               cPET 05/03/2023: small/mild reversible defect in               mid-anterosep/ant region No date: Complication of anesthesia     Comment:  a.) delayed emergence; b.) emergence delirium; I get               really loopy after surgery No date: COPD (chronic obstructive pulmonary disease) (HCC) No date: DDD (degenerative disc disease), lumbosacral No date: Depression No date: Diverticulosis No date: Epigastric hernia No date: GERD (gastroesophageal reflux disease) No date: Glaucoma No date: Heart murmur No date: Heart palpitations 2015: History of bilateral cataract extraction No date: History of recurrent UTIs No date: Hx of adenomatous colonic polyps No date: Hyperlipidemia No date: IBS (irritable bowel syndrome) No date: Insomnia No date: Iron  deficiency anemia No date: Kidney cysts No date:  Long term current use of aspirin  No date: Macular degeneration of left eye No date: Osteoporosis     Comment:  a.) on RANKLi (denosumab ) No date: 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     Comment:  a.) noted on VCE in 2007 No date: Type 2 diabetes, diet controlled (HCC) No date: Wears dentures     Comment:  partial lower  Past Surgical History: No date: BUNIONECTOMY; Right No date: CATARACT EXTRACTION; Bilateral No date: CERVICAL CONE BIOPSY No date: CHOLECYSTECTOMY No date: COLONOSCOPY 01/22/2019: COLONOSCOPY WITH PROPOFOL ; N/A     Comment:  Procedure: COLONOSCOPY WITH BIOPSY;  Surgeon: Jinny Carmine, MD;  Location: [facility];  Service:               Endoscopy;  Laterality: N/A;  diabetic - diet controlled 06/27/2020: COLONOSCOPY WITH  PROPOFOL ; N/A     Comment:  Procedure: COLONOSCOPY WITH BIOPSY;  Surgeon: Jinny Carmine, MD;  Location: [facility];  Service:               Endoscopy;  Laterality: N/A;  priority 4 08/29/2017: ESOPHAGOGASTRODUODENOSCOPY (EGD) WITH PROPOFOL ; N/A     Comment:  Procedure: ESOPHAGOGASTRODUODENOSCOPY (EGD) WITH               PROPOFOL ;  Surgeon: Jinny Carmine, MD;  Location: [facility]               SURGERY CNTR;  Service: Endoscopy;  Laterality: N/A; No date: FOOT SURGERY     Comment:  right 2nd toe 05/16/2018: HIP ARTHROPLASTY; Left     Comment:  Procedure: ARTHROPLASTY BIPOLAR HIP (HEMIARTHROPLASTY);               Surgeon: Edie Norleen PARAS, MD;  Location: [facility] ORS;                Service: Orthopedics;  Laterality: Left; 04/14/2023: LOWER EXTREMITY ANGIOGRAPHY; Left     Comment:  Procedure: Lower Extremity Angiography;  Surgeon: Marea Selinda RAMAN, MD;  Location: [facility] INVASIVE CV LAB;  Service:               Cardiovascular;  Laterality: Left; 12/10/2021: ORIF PERIPROSTHETIC FRACTURE; Left     Comment:  Procedure: OPEN REDUCTION INTERNAL FIXATION OF A LEFT               GREATER TROCHANTERIC FRACTURE;  Surgeon: Edie Norleen PARAS,               MD;  Location: [facility] ORS;  Service: Orthopedics;                Laterality: Left; 10/07/2020: ORIF WRIST FRACTURE; Right     Comment:  Procedure: OPEN REDUCTION INTERNAL FIXATION (ORIF) WRIST              FRACTURE;  Surgeon: Kathlynn Sharper, MD;  Location: [facility]               ORS;  Service: Orthopedics;  Laterality: Right; 01/22/2019: POLYPECTOMY; N/A     Comment:  Procedure: POLYPECTOMY;  Surgeon: Jinny Carmine, MD;                Location: [facility] SUR[facility]ERY [facility];  Service: Endoscopy;  Laterality: N/A;  2 Clips placed at polyp removal site in              ascending colon 06/27/2020: POLYPECTOMY; N/A     Comment:  Procedure: POLYPECTOMY;  Surgeon: Jinny Carmine, MD;                Location: [facility] SUR[facility]ERY [facility];  Service:  Endoscopy;                Laterality: N/A; No date: TUBAL LIGATION No date: UPPER GASTROINTESTINAL ENDOSCOPY  BMI    Body Mass Index: 20.15 kg/m      Reproductive/Obstetrics negative OB ROS                             Anesthesia Physical Anesthesia Plan  ASA: 3  Anesthesia Plan: General   Post-op Pain Management: Minimal or no pain anticipated   Induction: Intravenous  PONV Risk Score and Plan: 3 and Ondansetron , Propofol  infusion and TIVA  Airway Management Planned: Nasal Cannula and Natural Airway  Additional Equipment:   Intra-op Plan:   Post-operative Plan: Extubation in OR  Informed Consent: I have reviewed the patients History and Physical, chart, labs and discussed the procedure including the risks, benefits and alternatives for the proposed anesthesia with the patient or authorized representative who has indicated his/her understanding and acceptance.     Dental Advisory Given  Plan Discussed with: Anesthesiologist, CRNA and Surgeon  Anesthesia Plan Comments: (Discussed risks of anesthesia with patient, including possibility of difficulty with spontaneous ventilation under anesthesia necessitating airway intervention, PONV, and rare risks such as cardiac or respiratory or neurological events, and allergic reactions. Discussed the role of CRNA in patient's perioperative care. Patient understands.)       Anesthesia Quick Evaluation

## 2023-09-16 NOTE — Plan of Care (Signed)

## 2023-09-16 NOTE — Progress Notes (Signed)
 Approximately 1430--Pt arrived to room 224A. VS obtained and full assessment completed by this RN. All fall precautions in place. Telemetry connected and notified. Pt accompanied by daughter-in-law, at bedside.

## 2023-09-16 NOTE — Care Management Obs Status (Signed)
 MEDICARE OBSERVATION STATUS NOTIFICATION   Patient Details  Name: Yolanda Jimenez MRN: [id number] Date of Birth: June 22, 1946   Medicare Observation Status Notification Given:  Yes    Felix Host 09/16/2023, 3:04 PM

## 2023-09-16 NOTE — TOC Initial Note (Signed)
 Transition of Care [facility]) - Initial/Assessment Note    Patient Details  Name: Yolanda Jimenez MRN: [id number] Date of Birth: Sep 15, 1945  Transition of Care [facility]) CM/SW Contact:    Lauraine JAYSON Carpen, LCSW Phone Number: 09/16/2023, 12:57 PM  Clinical Narrative:  Patient is active with [facility] for PT, OT, and social work. CSW will follow progress and facilitate return home once stable.              Expected Discharge Plan: Home w Home Health Services Barriers to Discharge: Continued Medical Work up   Patient Goals and CMS Choice            Expected Discharge Plan and Services       Living arrangements for the past 2 months: Apartment                           HH Arranged: PT, OT, Social Work [facility] Agency: [facility] Date [facility] Agency Contacted: 09/16/23   Representative spoke with at [facility] Agency: Darleene Gowda  Prior Living Arrangements/Services Living arrangements for the past 2 months: Apartment   Patient language and need for interpreter reviewed:: Yes Do you feel safe going back to the place where you live?: Yes      Need for Family Participation in Patient Care: Yes (Comment)   Current home services: Home OT, Home PT, Other (comment) (Home social worker) Criminal Activity/Legal Involvement Pertinent to Current Situation/Hospitalization: No - Comment as needed  Activities of Daily Living   ADL Screening (condition at time of admission) Independently performs ADLs?: Yes (appropriate for developmental age) Is the patient deaf or have difficulty hearing?: No Does the patient have difficulty seeing, even when wearing glasses/contacts?: No Does the patient have difficulty concentrating, remembering, or making decisions?: No  Permission Sought/Granted                  Emotional Assessment       Orientation: : Oriented to Self, Oriented to Place, Oriented to  Time, Oriented to Situation Alcohol / Substance Use: Not Applicable Psych  Involvement: No (comment)  Admission diagnosis:  GI bleeding [K92.2] Upper GI bleed [K92.2] Anemia, unspecified type [D64.9] Patient Active Problem List   Diagnosis Date Noted   UTI (urinary tract infection) 09/16/2023   Upper GI bleed 09/16/2023   GI bleeding 09/15/2023   Acute blood loss anemia 09/15/2023   Depression with anxiety 09/15/2023   HTN (hypertension) 09/15/2023   COPD (chronic obstructive pulmonary disease) (HCC) 09/15/2023   CAD (coronary artery disease) 09/15/2023   PAF (paroxysmal atrial fibrillation) (HCC) 09/15/2023   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) 08/24/2023   Epigastric hernia    Atherosclerosis of native arteries of extremity with intermittent claudication (HCC) 03/29/2023   Trochanteric avulsion fracture of femur (HCC) 12/10/2021   History of colonic polyps    Polyp of transverse colon    Major depressive disorder, recurrent, 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   Loss of weight    Polyp of ascending colon    Moderate protein-calorie malnutrition (HCC) 09/25/2018   Tubular adenoma 09/25/2018   Adult idiopathic generalized osteoporosis 08/03/2018   Aortic atherosclerosis (HCC) 08/03/2018   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   Cellulitis of left hip 06/01/2018   Status post hip hemiarthroplasty 05/17/2018   Hip fracture (HCC) 05/15/2018   Tobacco abuse 04/04/2018   DM type 2 with diabetic mixed hyperlipidemia (HCC) 03/08/2018   Low serum vitamin  D 09/09/2017   History of Barrett's esophagus    Esophagitis    PAD (peripheral artery disease) (HCC) 10/15/2016   Carotid stenosis 10/15/2016   Pain in toe 09/17/2016   Renal cyst, acquired, left 09/13/2016   Medicare annual wellness visit, initial 09/03/2016   Type 2 diabetes mellitus with peripheral angiopathy (HCC) 08/29/2014   Combined fat and carbohydrate induced hyperlipemia 08/29/2014   Increased frequency of urination 03/19/2014   Lower abdominal pain 03/02/2014    Microscopic hematuria 03/01/2014   Nocturia 03/01/2014   Urinary tract infection, site not specified 03/01/2014   Diarrhea 03/05/2013   COLONIC POLYPS, ADENOMATOUS 09/05/2007   Hyperlipidemia 09/05/2007   Iron  deficiency anemia 09/05/2007   BARRETTS ESOPHAGUS 09/05/2007   Diverticulosis of colon 09/05/2007   IRRITABLE BOWEL SYNDROME 09/05/2007   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   PCP:  Cleotilde Oneil FALCON, MD Pharmacy:   [facility] - [place], [place] - [address] [place] [address] Phone: [phone] Fax: [phone]     Social Drivers of Health (SDOH) Social History: SDOH Screenings   Food Insecurity: No Food Insecurity (09/16/2023)  Housing: Low Risk  (09/16/2023)  Transportation Needs: No Transportation Needs (09/16/2023)  Utilities: Not At Risk (09/16/2023)  Depression (PHQ2-9): Low Risk  (12/23/2022)  Financial Resource Strain: Low Risk  (05/17/2023)   Received from [facility]  Social Connections: Moderately Isolated (09/16/2023)  Tobacco Use: High Risk (09/16/2023)   SDOH Interventions:     Readmission Risk Interventions     No data to display

## 2023-09-16 NOTE — Op Note (Signed)
 [facility] Gastroenterology Patient Name: Yolanda Jimenez Procedure Date: 09/16/2023 11:12 AM MRN: [id number] Account #: [id number] Date of Birth: [birth year] Admit Type: Emergency Department Age: 78 Room: [facility] ENDO ROOM 2 Gender: Female Note Status: Finalized Instrument Name: Upper Endoscope [id number] Procedure:             Upper GI endoscopy Indications:           Iron  deficiency anemia secondary to chronic blood loss Providers:             Rogelia Copping MD, MD Referring MD:          Oneil PHEBE Pinal, MD (Referring MD) Medicines:             Propofol  per Anesthesia Complications:         No immediate complications. Procedure:             Pre-Anesthesia Assessment:                        - Prior to the procedure, a History and Physical was                         performed, and patient medications and allergies were                         reviewed. The patient's tolerance of previous                         anesthesia was also reviewed. The risks and benefits                         of the procedure and the sedation options and risks                         were discussed with the patient. All questions were                         answered, and informed consent was obtained. Prior                         Anticoagulants: The patient has taken no anticoagulant                         or antiplatelet agents. ASA Grade Assessment: II - A                         patient with mild systemic disease. After reviewing                         the risks and benefits, the patient was deemed in                         satisfactory condition to undergo the procedure.                        After obtaining informed consent, the endoscope was                         passed under direct vision. Throughout the procedure,  the patient's blood pressure, pulse, and oxygen                         saturations were monitored continuously. The Endoscope                          was introduced through the mouth, and advanced to the                         third part of duodenum. The upper GI endoscopy was                         accomplished without difficulty. The patient tolerated                         the procedure well. Findings:      The examined esophagus was normal.      The entire examined stomach was normal.      Multiple small angiodysplastic lesions with stigmata of recent bleeding       were found in the entire duodenum. Coagulation for hemostasis using       argon plasma at 2 liters/minute and 20 watts was successful. Impression:            - Normal esophagus.                        - Normal stomach.                        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 Treated with argon plasma coagulation                         (APC).                        - No specimens collected. Recommendation:        - Return patient to hospital ward for ongoing care.                        - Resume previous diet.                        - Continue present medications. Procedure Code(s):     --- Professional ---                        [phone], Esophagogastroduodenoscopy, flexible,                         transoral; with control of bleeding, any method Diagnosis Code(s):     --- Professional ---                        D50.0, Iron  deficiency anemia secondary to blood loss                         (chronic)  K31.811, Angiodysplasia of stomach and duodenum with                         bleeding CPT copyright 2022 American Medical Association. All rights reserved. The codes documented in this report are preliminary and upon coder review may  be revised to meet current compliance requirements. Rogelia Copping MD, MD 09/16/2023 11:39:50 AM This report has been signed electronically. Number of Addenda: 0 Note Initiated On: 09/16/2023 11:12 AM Estimated Blood Loss:  Estimated blood loss: none.       [facility]

## 2023-09-17 DIAGNOSIS — K922 Gastrointestinal hemorrhage, unspecified: Secondary | ICD-10-CM | POA: Diagnosis not present

## 2023-09-17 DIAGNOSIS — D62 Acute posthemorrhagic anemia: Secondary | ICD-10-CM | POA: Diagnosis not present

## 2023-09-17 DIAGNOSIS — K31811 Angiodysplasia of stomach and duodenum with bleeding: Secondary | ICD-10-CM | POA: Diagnosis not present

## 2023-09-17 LAB — COMPREHENSIVE METABOLIC PANEL
ALT: 9 U/L (ref 0–44)
AST: 15 U/L (ref 15–41)
Albumin: 3.1 g/dL — ABNORMAL LOW (ref 3.5–5.0)
Alkaline Phosphatase: 37 U/L — ABNORMAL LOW (ref 38–126)
Anion gap: 7 (ref 5–15)
BUN: 15 mg/dL (ref 8–23)
CO2: 24 mmol/L (ref 22–32)
Calcium: 8.4 mg/dL — ABNORMAL LOW (ref 8.9–10.3)
Chloride: 109 mmol/L (ref 98–111)
Creatinine, Ser: 0.7 mg/dL (ref 0.44–1.00)
GFR, Estimated: >60 mL/min (ref >60)
Glucose, Bld: 110 mg/dL — ABNORMAL HIGH (ref 70–99)
Potassium: 4.1 mmol/L (ref 3.5–5.1)
Sodium: 140 mmol/L (ref 135–145)
Total Bilirubin: 0.4 mg/dL (ref 0.0–1.2)
Total Protein: 5.3 g/dL — ABNORMAL LOW (ref 6.5–8.1)

## 2023-09-17 LAB — CBC WITH DIFFERENTIAL/PLATELET
Abs Immature Granulocytes: 0.03 10*3/uL (ref 0.00–0.07)
Basophils Absolute: 0 10*3/uL (ref 0.0–0.1)
Basophils Relative: 1 %
Eosinophils Absolute: 0.2 10*3/uL (ref 0.0–0.5)
Eosinophils Relative: 3 %
HCT: 26.5 % — ABNORMAL LOW (ref 36.0–46.0)
Hemoglobin: 8.4 g/dL — ABNORMAL LOW (ref 12.0–15.0)
Immature Granulocytes: 0 %
Lymphocytes Relative: 8 %
Lymphs Abs: 0.6 10*3/uL — ABNORMAL LOW (ref 0.7–4.0)
MCH: 29.3 pg (ref 26.0–34.0)
MCHC: 31.7 g/dL (ref 30.0–36.0)
MCV: 92.3 fL (ref 80.0–100.0)
Monocytes Absolute: 0.9 10*3/uL (ref 0.1–1.0)
Monocytes Relative: 13 %
Neutro Abs: 5.1 10*3/uL (ref 1.7–7.7)
Neutrophils Relative %: 75 %
Platelets: 412 10*3/uL — ABNORMAL HIGH (ref 150–400)
RBC: 2.87 MIL/uL — ABNORMAL LOW (ref 3.87–5.11)
RDW: 20.5 % — ABNORMAL HIGH (ref 11.5–15.5)
WBC: 6.8 10*3/uL (ref 4.0–10.5)
nRBC: 0 % (ref 0.0–0.2)

## 2023-09-17 LAB — GLUCOSE, CAPILLARY
Glucose-Capillary: 141 mg/dL — ABNORMAL HIGH (ref 70–99)
Glucose-Capillary: 138 mg/dL — ABNORMAL HIGH (ref 70–99)

## 2023-09-17 LAB — MAGNESIUM: Magnesium: 2.1 mg/dL (ref 1.7–2.4)

## 2023-09-17 LAB — PHOSPHORUS: Phosphorus: 3.5 mg/dL (ref 2.5–4.6)

## 2023-09-17 NOTE — Plan of Care (Signed)

## 2023-09-17 NOTE — Progress Notes (Signed)
 Approximately 1300-- Pt discharged to home. At time of discharge, pt A&O x 4 and VSS. AVS discharge instructions provided to pt with all questions answered at this time. Teach-back technique utilized. All PIVs removed with sites WDL. Telemetry disconnected and notified.

## 2023-09-17 NOTE — Hospital Course (Signed)
 Ms. Cousin is a 78 year old female with hypertension, hyperlipidemia, diet-controlled diabetes, COPD, tobacco abuse, peripheral arterial disease status post bilateral femoral-popliteal bypass on 08/24/2023 currently on dual antiplatelet therapy, CAD, depression, anxiety, iron  deficiency anemia, A-fib not on anticoagulants, small bowel AVM, IBS, history of C. difficile and diverticulosis who presented with tarry stools.  Patient recently underwent bilateral femoral endarterectomies and postoperative hemoglobin was 10.5.  Over the last 3 weeks it has been downtrending.  She was seen by her hematologist on 2/6 and was found have a hemoglobin of 6.1 so she was urged to come to the ED for further evaluation.  Additionally she was recently started on ciprofloxacin  for UTI.  On arrival to the ED she was found to have a hemoglobin of 6.3 with all other vital signs within normal limits.  GI was consulted.  Patient underwent an EGD on 2/7 which revealed multiple small angiodysplastic lesions with stigmata of recent bleeding throughout the entire duodenum.  She underwent coagulation with argon for hemostasis and this was successful.  She received 2 units of PRBCs and hemoglobin rose expectedly.  By reevaluation on 2/8 patient reported feeling back to baseline and had only had 1 additional bowel movement of dark discoloration.  She will need to resume her dual antiplatelet therapies in 48 hours.  We discussed strict ER return precautions.  She is also recommended to continue taking her PPI as prescribed.  She endorses understanding and will discharge home today.  She plans to follow-up with GI in the clinic to pursue colonoscopy.

## 2023-09-17 NOTE — Discharge Summary (Signed)
 Physician Discharge Summary   Patient: Yolanda Jimenez MRN: [id number] DOB: 1945/12/15  Admit date:     09/15/2023  Discharge date: 09/17/23  Discharge Physician: Lorane Poland   PCP: Cleotilde Oneil FALCON, MD   Recommendations at discharge:   Follow up with gastroenterology in the clinic for colonoscopy Follow-up with your primary care physician Follow-up with your vascular surgeon  Discharge Diagnoses: Principal Problem:   GI bleeding Active Problems:   Iron  deficiency anemia   Acute blood loss anemia   PAD (peripheral artery disease) (HCC)   Hyperlipidemia   HTN (hypertension)   COPD (chronic obstructive pulmonary disease) (HCC)   CAD (coronary artery disease)   PAF (paroxysmal atrial fibrillation) (HCC)   Depression with anxiety   Tobacco abuse   UTI (urinary tract infection)   Upper GI bleed  Resolved Problems:   * No resolved hospital problems. [place] [facility] Course: Yolanda Jimenez is a 78 year old female with hypertension, hyperlipidemia, diet-controlled diabetes, COPD, tobacco abuse, peripheral arterial disease status post bilateral femoral-popliteal bypass on 08/24/2023 currently on dual antiplatelet therapy, CAD, depression, anxiety, iron  deficiency anemia, A-fib not on anticoagulants, small bowel AVM, IBS, history of C. difficile and diverticulosis who presented with tarry stools.  Patient recently underwent bilateral femoral endarterectomies and postoperative hemoglobin was 10.5.  Over the last 3 weeks it has been downtrending.  She was seen by her hematologist on 2/6 and was found have a hemoglobin of 6.1 so she was urged to come to the ED for further evaluation.  Additionally she was recently started on ciprofloxacin  for UTI.  On arrival to the ED she was found to have a hemoglobin of 6.3 with all other vital signs within normal limits.  GI was consulted.  Patient underwent an EGD on 2/7 which revealed multiple small angiodysplastic lesions with stigmata of recent bleeding  throughout the entire duodenum.  She underwent coagulation with argon for hemostasis and this was successful.  She received 2 units of PRBCs and hemoglobin rose expectedly.  By reevaluation on 2/8 patient reported feeling back to baseline and had only had 1 additional bowel movement of dark discoloration.  She will need to resume her dual antiplatelet therapies in 48 hours.  We discussed strict ER return precautions.  She is also recommended to continue taking her PPI as prescribed.  She endorses understanding and will discharge home today.  She plans to follow-up with GI in the clinic to pursue colonoscopy.       Consultants: Gastroenterology Procedures performed: EGD  Disposition: Home Diet recommendation:  Discharge Diet Orders (From admission, onward)     Start     Ordered   09/17/23 0000  Diet general        09/17/23 1224           Regular diet DISCHARGE MEDICATION: Allergies as of 09/17/2023       Reactions   Codeine    felt drunk, crazy   Hydrocodone Other (See Comments)   Hydrocodone-acetaminophen  Other (See Comments)   Confusion/delirium   Lidocaine     Heart racing, increased BP - from local at dentist (likely epi)   Nsaids Other (See Comments)   Avoids due to IBS symptoms (bleeding symptoms)   Oxycodone     Confusion/delirium        Medication List     STOP taking these medications    sertraline  50 MG tablet Commonly known as: ZOLOFT    vitamin C  1000 MG tablet       TAKE these medications  acetaminophen  500 MG tablet Commonly known as: TYLENOL  Take 1,000 mg by mouth every 6 (six) hours as needed.   aspirin  EC 81 MG tablet Take 1 tablet (81 mg total) by mouth daily. Swallow whole.   buPROPion  75 MG tablet Commonly known as: WELLBUTRIN  Take 75 mg by mouth every morning.   Cholecalciferol  125 MCG (5000 UT) Tabs Take 1 tablet by mouth daily.   ciprofloxacin  250 MG tablet Commonly known as: CIPRO  Take 250 mg by mouth 2 (two) times daily.    clopidogrel  75 MG tablet Commonly known as: PLAVIX  Take 1 tablet (75 mg total) by mouth daily.   cyanocobalamin  1000 MCG tablet Take 1,000 mcg by mouth daily.   donepezil  5 MG tablet Commonly known as: ARICEPT  Take 1 tablet by mouth at bedtime.   Ferrous Gluconate  239 (27 Fe) MG Tabs Take 1 tablet by mouth daily.   fluticasone  50 MCG/ACT nasal spray Commonly known as: FLONASE  Place 1 spray into both nostrils daily as needed for allergies or rhinitis.   loperamide  2 MG capsule Commonly known as: IMODIUM  Take 2 mg by mouth every morning.   metoprolol  succinate 25 MG 24 hr tablet Commonly known as: TOPROL -XL Take 1 tablet by mouth daily.   pantoprazole  40 MG tablet Commonly known as: PROTONIX  Take 40 mg by mouth daily.   Prolia  60 MG/ML Sosy injection Generic drug: denosumab  Inject 60 mg into the skin every 6 (six) months.   rosuvastatin  20 MG tablet Commonly known as: CRESTOR  Take 20 mg by mouth daily.        Discharge Exam: Constitutional:  Normal appearance. Non toxic-appearing.  HENT: Head Normocephalic and atraumatic.  Mucous membranes are moist.  Eyes:  Extraocular intact. Conjunctivae normal. Pupils are equal, round, and reactive to light.  Cardiovascular: Rate and Rhythm: Normal rate and regular rhythm.  Pulmonary: Non labored, symmetric rise of chest wall.  Musculoskeletal:  Normal range of motion.  Skin: warm and dry. not jaundiced.  Neurological: No focal deficit present. alert. Oriented. Psychiatric: Mood and Affect congruent.   Today's Vitals   09/16/23 2015 09/17/23 0352 09/17/23 0747 09/17/23 0749  BP: 109/68 (!) 147/70 108/61   Pulse: (!) 58 (!) 57 71   Resp: 18 18 16    Temp: 98.2 F (36.8 C) 98.2 F (36.8 C) 98 F (36.7 C)   TempSrc: Oral Oral Oral   SpO2: 98% 100% 99%   Height:      PainSc:    0-No pain   Body mass index is 18.75 kg/m.   Condition at discharge: stable  The results of significant diagnostics from this  hospitalization (including imaging, microbiology, ancillary and laboratory) are listed below for reference.   Imaging Studies: VAS US  ABI WITH/WO TBI Result Date: 09/16/2023  LOWER EXTREMITY DOPPLER STUDY Patient Name:  Yolanda Jimenez  Date of Exam:   09/13/2023 Medical Rec #: [id number]       Accession #:    [id number] Date of Birth: 12-Sep-1945       Patient Gender: F Patient Age:   39 years Exam Location:  [facility] Procedure:      VAS US  ABI WITH/WO TBI Referring Phys: --------------------------------------------------------------------------------  Indications: Claudication, and peripheral artery disease. High Risk Factors: Hyperlipidemia.  Vascular Interventions: 08/23/2022 Left common femoral, profunda femoris, and                         superficial femoral artery endarterectomies  2. Right common femoral, profunda femoris, and                         superficial femoral artery endarterectomies                         3. Aortogram and iliofemoral angiograms                         4. Use of the shockwave device for angioplasty and                         lithotripsy of the left common iliac artery with a 6 mm                         diameter by 6 cm length device                         5. Use of the shockwave device for angioplasty and                         lithotripsy of the right common iliac artery using an 8                         mm diameter by 6 cm length device                         6. Placement of an Endologix aortic stent graft with 20                         mm proximal and 13 mm distal stents for treatment of                         occlusive disease.                         7. Lifestream balloon expandable stent placement into                         the right common iliac artery with 10 mm diameter by 38                         mm length Lifestream stent                         8. Lifestream balloon expandable stent placement of the                          left common iliac artery with 9 mm diameter by 58 mm                         length Lifestream stent. Comparison Study: 03/2023 Performing Technologist: Jerel Croak RVT  Examination Guidelines: A complete evaluation includes at minimum, Doppler waveform signals and systolic blood pressure reading at the level of bilateral brachial, anterior tibial, and posterior tibial arteries, when vessel segments are accessible. Bilateral testing is considered an integral part of a complete examination. Photoelectric Plethysmograph (PPG) waveforms  and toe systolic pressure readings are included as required and additional duplex testing as needed. Limited examinations for reoccurring indications may be performed as noted.  ABI Findings: +---------+------------------+-----+---------+--------+ Right    Rt Pressure (mmHg)IndexWaveform Comment  +---------+------------------+-----+---------+--------+ Brachial 137                                      +---------+------------------+-----+---------+--------+ PTA      151               1.10 biphasic          +---------+------------------+-----+---------+--------+ DP       156               1.14 triphasic         +---------+------------------+-----+---------+--------+ Great Toe54                0.39 Abnormal          +---------+------------------+-----+---------+--------+ +---------+------------------+-----+---------+-------+ Left     Lt Pressure (mmHg)IndexWaveform Comment +---------+------------------+-----+---------+-------+ Brachial 135                                     +---------+------------------+-----+---------+-------+ PTA      146               1.07 biphasic         +---------+------------------+-----+---------+-------+ DP       152               1.11 triphasic        +---------+------------------+-----+---------+-------+ Great Toe106               0.77 Normal            +---------+------------------+-----+---------+-------+ +-------+-----------+-----------+------------+------------+ ABI/TBIToday's ABIToday's TBIPrevious ABIPrevious TBI +-------+-----------+-----------+------------+------------+ Right  1.14       .39        .99         .38          +-------+-----------+-----------+------------+------------+ Left   1.11       .77        .61         .05          +-------+-----------+-----------+------------+------------+ Left ABIs and TBIs appear increased compared to prior study on 03/2023. Right ABIs appear increased.  *See table(s) above for measurements and observations.  Electronically signed by Selinda Gu MD on 09/16/2023 at 7:01:17 AM.    Final    DG Chest Port 1 View Result Date: 08/26/2023 CLINICAL DATA:  Hypoxemia EXAM: PORTABLE CHEST - 1 VIEW COMPARISON:  08/25/2023 FINDINGS: Lungs are clear. Heart size and mediastinal contours are within normal limits. Aortic Atherosclerosis (ICD10-170.0). No effusion. Visualized bones unremarkable. IMPRESSION: No acute cardiopulmonary disease. Electronically Signed   By: JONETTA Faes M.D.   On: 08/26/2023 12:19   DG Chest Port 1 View Result Date: 08/25/2023 CLINICAL DATA:  Dyspnea EXAM: PORTABLE CHEST 1 VIEW COMPARISON:  05/15/2018, 11/18/2022 FINDINGS: The heart size and mediastinal contours are within normal limits. Aortic atherosclerosis. No focal airspace consolidation, pleural effusion, or pneumothorax. The visualized skeletal structures are unremarkable. IMPRESSION: No active disease. Electronically Signed   By: Mabel Converse D.O.   On: 08/25/2023 13:29   DG C-Arm 1-60 Min-No Report Result Date: 08/24/2023 Fluoroscopy was utilized by the requesting physician.  No radiographic interpretation.   DG C-Arm 1-60 Min-No Report  Result Date: 08/24/2023 Fluoroscopy was utilized by the requesting physician.  No radiographic interpretation.    Microbiology: Results for orders placed or performed during the  hospital encounter of 08/24/23  MRSA Next Gen by PCR, Nasal     Status: None   Collection Time: 08/24/23  2:47 PM   Specimen: Nasal Mucosa; Nasal Swab  Result Value Ref Range Status   MRSA by PCR Next Gen NOT DETECTED NOT DETECTED Final    Comment: (NOTE) The GeneXpert MRSA Assay (FDA approved for NASAL specimens only), is one component of a comprehensive MRSA colonization surveillance program. It is not intended to diagnose MRSA infection nor to guide or monitor treatment for MRSA infections. Test performance is not FDA approved in patients less than 26 years old. Performed at [facility], [address] [place], [place] [address]     Labs: CBC: Recent Labs  Lab 09/15/23 1317 09/15/23 1540 09/16/23 0219 09/16/23 0450 09/17/23 0459  WBC 8.0 7.8 5.9 5.7 6.8  NEUTROABS 6.5 6.0  --   --  5.1  HGB 6.1* 6.3* 8.0* 7.8* 8.4*  HCT 20.7* 21.6* 25.3* 25.7* 26.5*  MCV 97.2 100.5* 91.0 94.8 92.3  PLT 544* 574* 426* 416* 412*   Basic Metabolic Panel: Recent Labs  Lab 09/15/23 1540 09/16/23 0450 09/17/23 0459  NA 136 138 140  K 4.1 4.1 4.1  CL 104 108 109  CO2 18* 20* 24  GLUCOSE 138* 96 110*  BUN 28* 24* 15  CREATININE 0.84 0.69 0.70  CALCIUM  8.7* 8.1* 8.4*  MG  --   --  2.1  PHOS  --   --  3.5   Liver Function Tests: Recent Labs  Lab 09/17/23 0459  AST 15  ALT 9  ALKPHOS 37*  BILITOT 0.4  PROT 5.3*  ALBUMIN  3.1*   CBG: Recent Labs  Lab 09/16/23 0758 09/17/23 0747 09/17/23 1119  GLUCAP 122* 141* 138*    Discharge time spent: greater than 30 minutes.  Signed: Mattia Osterman, DO [facility] 09/17/2023

## 2023-09-19 ENCOUNTER — Encounter: Payer: Self-pay | Admitting: Gastroenterology

## 2023-09-19 DIAGNOSIS — Z48812 Encounter for surgical aftercare following surgery on the circulatory system: Secondary | ICD-10-CM | POA: Diagnosis not present

## 2023-09-19 DIAGNOSIS — F329 Major depressive disorder, single episode, unspecified: Secondary | ICD-10-CM | POA: Diagnosis not present

## 2023-09-19 DIAGNOSIS — I2510 Atherosclerotic heart disease of native coronary artery without angina pectoris: Secondary | ICD-10-CM | POA: Diagnosis not present

## 2023-09-19 DIAGNOSIS — I4891 Unspecified atrial fibrillation: Secondary | ICD-10-CM | POA: Diagnosis not present

## 2023-09-19 DIAGNOSIS — I350 Nonrheumatic aortic (valve) stenosis: Secondary | ICD-10-CM | POA: Diagnosis not present

## 2023-09-19 DIAGNOSIS — I70219 Atherosclerosis of native arteries of extremities with intermittent claudication, unspecified extremity: Secondary | ICD-10-CM | POA: Diagnosis not present

## 2023-09-19 DIAGNOSIS — J449 Chronic obstructive pulmonary disease, unspecified: Secondary | ICD-10-CM | POA: Diagnosis not present

## 2023-09-19 DIAGNOSIS — F419 Anxiety disorder, unspecified: Secondary | ICD-10-CM | POA: Diagnosis not present

## 2023-09-19 NOTE — Progress Notes (Signed)
Celso Amy, PA-C [address]  [address]  [place], [place] [address]  Main: [phone]  Fax: [phone]   Primary Care Physician: Danella Penton, MD  Primary Gastroenterologist:  Celso Amy, PA-C / Dr. Midge Minium    CC: Follow-up chronic iron deficiency anemia, GI bleed, diarrhea.  HPI: Yolanda Jimenez is a 78 y.o. female returns for 1 month follow-up of chronic iron deficiency anemia, GI bleed, chronic diarrhea (attributed to IBS).  She has history of GERD and Barrett's esophagus.  She receives IV iron infusions through Dr. Alena Bills.  She is scheduled for IV iron weekly for the next 4 weeks.  Past medical history of A-fib, arthritis, asthma, AVM, GERD, Barrett's esophagus, carotid stenosis, COPD, diabetes, GERD, IBS, PVD, history of C. difficile (2012).  Patient underwent bilateral femoral endarterectomies with stent placement 08/24/2023 by Dr. Wyn Quaker due to blood clots and severe peripheral vascular disease.  Currently takes Plavix and aspirin daily.  She was admitted to [facility] 09/15/2023 until 09/17/2023 for upper GI bleed and worsening anemia (hemoglobin 6.1).  Received 2 units PRBC.  EGD 09/16/2023 by Dr. Servando Snare showed normal esophagus and stomach.  There were multiple small angiodysplastic lesions with signs of recent bleeding in the entire duodenum which were coagulated with argon plasma.  Currently on Protonix 40 Mg once daily.  Last labs 09/17/2023 showed low yet improved hemoglobin 8.4.  Current Symptoms: She has not had any black stoo.ls since hospital discharge. Denies hematochezia or melena.  Energy is improving.  Leg pain resolved after stent placement.  She has loose mushy dark brown BM every 2-3 days.  No watery stools.  No abdominal pain  Stool studies 08/22/2023: Negative C. difficile, negative GI pathogen panel, normal fecal calprotectin, normal pancreatic elastase.  08/15/2023: Celiac panel labs negative. 05/2023: Vitamin B12 normal (801).  GI History: -01/2019  colonoscopy: 15 mm tubular adenoma polyp removed from ascending colon.  3 mm polyp removed from ascending colon.  Excellent prep.  Diverticulosis and internal hemorrhoids.  Biopsies were negative for microscopic colitis and IBD. -06/2020 colonoscopy: 5 small (2 mm to 4 mm) tubular adenoma and 1 hyperplastic polyps removed from throughout the colon.  Diverticulosis.  Excellent prep.  No further repeat colonoscopies were recommended due to advanced age. -08/2017 EGD: LA grade A esophagitis, small hiatal hernia, normal stomach and duodenum.  Biopsy showed Barrett's mucosa with no dysplasia.  GERD is controlled on Prilosec 40 Mg once daily.  Current Outpatient Medications  Medication Sig Dispense Refill   acetaminophen (TYLENOL) 500 MG tablet Take 1,000 mg by mouth every 6 (six) hours as needed.     aspirin EC 81 MG tablet Take 1 tablet (81 mg total) by mouth daily. Swallow whole. 150 tablet 2   buPROPion (WELLBUTRIN) 75 MG tablet Take 75 mg by mouth every morning.     Cholecalciferol 125 MCG (5000 UT) TABS Take 1 tablet by mouth daily.     ciprofloxacin (CIPRO) 250 MG tablet Take 250 mg by mouth 2 (two) times daily.     clopidogrel (PLAVIX) 75 MG tablet Take 1 tablet (75 mg total) by mouth daily. 30 tablet 6   cyanocobalamin 1000 MCG tablet Take 1,000 mcg by mouth daily.     denosumab (PROLIA) 60 MG/ML SOSY injection Inject 60 mg into the skin every 6 (six) months.     donepezil (ARICEPT) 5 MG tablet Take 1 tablet by mouth at bedtime.     Ferrous Gluconate 239 (27 Fe) MG TABS Take  1 tablet by mouth daily.     fluticasone (FLONASE) 50 MCG/ACT nasal spray Place 1 spray into both nostrils daily as needed for allergies or rhinitis.     loperamide (IMODIUM) 2 MG capsule Take 2 mg by mouth every morning.     metoprolol succinate (TOPROL-XL) 25 MG 24 hr tablet Take 1 tablet by mouth daily.     pantoprazole (PROTONIX) 40 MG tablet Take 40 mg by mouth daily.     rosuvastatin (CRESTOR) 20 MG tablet Take 20 mg  by mouth daily.     No current facility-administered medications for this visit.    Allergies as of 09/20/2023 - Review Complete 09/20/2023  Allergen Reaction Noted   Codeine  08/23/2008   Hydrocodone Other (See Comments) 10/14/2022   Hydrocodone-acetaminophen Other (See Comments) 04/25/2015   Lidocaine  09/14/2018   Nsaids Other (See Comments) 05/15/2018   Oxycodone  10/21/2020    Past Medical History:  Diagnosis Date   A-fib [facility])    a.) CHA2DS2-VASc = 5 (age x2, sex, vascular disease history, T2DM) as of 08/23/2023; b.) cardiac rate/rhythm maintained on oral metoprolol succinate; no chronic OAC   Abnormal vaginal Pap smear 2015   Anxiety    a.) on BZO PRN (diazepam)   Aortic atherosclerosis (HCC)    Aortic stenosis 05/03/2023   a.) TTE 05/03/2023: mild AS (MPG 12 mmHg; AVA = 1.3 cm2)   Arthritis of spine    Asthma    Barrett esophagus    Benign hematuria 08/29/2014   W/u neg  Normal renal ultrasound 2/19  Cystoscopy normal 2019, [place]  CT -9/24     Bilateral carotid artery disease (HCC)    a.) carotid dopplers 10/13/2016, 10/24/2018, 10/26/2019, 10/21/2020, 10/22/2022: 1-39% BICA   C. difficile diarrhea    CAD (coronary artery disease)    a.) CT chest 07/12/2018: LAD calcifications; b.) CT CAP 11/18/2022: 3 vessel CAD; c.) cPET 05/19/2023: severe 4 vessel coronary calcs mainly in LAD distribution; d.) cPET 05/03/2023: small/mild reversible defect in mid-anterosep/ant region   Complication of anesthesia    a.) delayed emergence; b.) emergence delirium; "I get really loopy after surgery"   COPD (chronic obstructive pulmonary disease) (HCC)    DDD (degenerative disc disease), lumbosacral    Depression    Diverticulosis    Epigastric hernia    GERD (gastroesophageal reflux disease)    Glaucoma    Heart murmur    Heart palpitations    History of bilateral cataract extraction 2015   History of recurrent UTIs    Hx of adenomatous colonic polyps    Hyperlipidemia    IBS  (irritable bowel syndrome)    Insomnia    Iron deficiency anemia    Kidney cysts    Long term current use of aspirin    Macular degeneration of left eye    Osteoporosis    a.) on RANKLi (denosumab)   PVD (peripheral vascular disease) with claudication (HCC)    Small bowel arteriovenous malformation 2007   a.) noted on VCE in 2007   Type 2 diabetes, diet controlled (HCC)    Wears dentures    partial lower    Past Surgical History:  Procedure Laterality Date   ABDOMINAL AORTIC ENDOVASCULAR STENT GRAFT N/A 08/24/2023   Procedure: ABDOMINAL AORTIC ENDOVASCULAR STENT GRAFT;  Surgeon: Annice Needy, MD;  Location: [facility] ORS;  Service: Vascular;  Laterality: N/A;   BUNIONECTOMY Right    CATARACT EXTRACTION Bilateral    CERVICAL CONE BIOPSY  CHOLECYSTECTOMY     COLONOSCOPY     COLONOSCOPY WITH PROPOFOL N/A 01/22/2019   Procedure: COLONOSCOPY WITH BIOPSY;  Surgeon: Midge Minium, MD;  Location: [facility];  Service: Endoscopy;  Laterality: N/A;  diabetic - diet controlled   COLONOSCOPY WITH PROPOFOL N/A 06/27/2020   Procedure: COLONOSCOPY WITH BIOPSY;  Surgeon: Midge Minium, MD;  Location: [facility];  Service: Endoscopy;  Laterality: N/A;  priority 4   ENDARTERECTOMY FEMORAL Bilateral 08/24/2023   Procedure: ENDARTERECTOMY FEMORAL;  Surgeon: Annice Needy, MD;  Location: [facility] ORS;  Service: Vascular;  Laterality: Bilateral;   ESOPHAGOGASTRODUODENOSCOPY (EGD) WITH PROPOFOL N/A 08/29/2017   Procedure: ESOPHAGOGASTRODUODENOSCOPY (EGD) WITH PROPOFOL;  Surgeon: Midge Minium, MD;  Location: [facility];  Service: Endoscopy;  Laterality: N/A;   ESOPHAGOGASTRODUODENOSCOPY (EGD) WITH PROPOFOL N/A 09/16/2023   Procedure: ESOPHAGOGASTRODUODENOSCOPY (EGD) WITH PROPOFOL;  Surgeon: Midge Minium, MD;  Location: [facility];  Service: Endoscopy;  Laterality: N/A;   FOOT SURGERY     right 2nd toe   HIP ARTHROPLASTY Left 05/16/2018   Procedure: ARTHROPLASTY BIPOLAR HIP  (HEMIARTHROPLASTY);  Surgeon: Christena Flake, MD;  Location: [facility] ORS;  Service: Orthopedics;  Laterality: Left;   HOT HEMOSTASIS  09/16/2023   Procedure: HOT HEMOSTASIS (ARGON PLASMA COAGULATION/BICAP);  Surgeon: Midge Minium, MD;  Location: [facility] ENDOSCOPY;  Service: Endoscopy;;   INSERTION OF ILIAC STENT Bilateral 08/24/2023   Procedure: INSERTION OF ILIAC STENT;  Surgeon: Annice Needy, MD;  Location: [facility] OR[facility];  Service: Vascular;  Laterality: Bilateral;   LOWER EXTREMITY ANGIOGRAPHY Left 04/14/2023   Procedure: Lower Extremity Angiography;  Surgeon: Annice Needy, MD;  Location: [facility] INVASIVE CV LAB;  Service: Cardiovascular;  Laterality: Left;   ORIF PERIPROSTHETIC FRACTURE Left 12/10/2021   Procedure: OPEN REDUCTION INTERNAL FIXATION OF A LEFT GREATER TROCHANTERIC FRACTURE;  Surgeon: Christena Flake, MD;  Location: [facility] OR[facility];  Service: Orthopedics;  Laterality: Left;   ORIF WRIST FRACTURE Right 10/07/2020   Procedure: OPEN REDUCTION INTERNAL FIXATION (ORIF) WRIST FRACTURE;  Surgeon: Kennedy Bucker, MD;  Location: [facility];  Service: Orthopedics;  Laterality: Right;   POLYPECTOMY N/A 01/22/2019   Procedure: POLYPECTOMY;  Surgeon: Midge Minium, MD;  Location: [facility];  Service: Endoscopy;  Laterality: N/A;  2 Clips placed at polyp removal site in ascending colon   POLYPECTOMY N/A 06/27/2020   Procedure: POLYPECTOMY;  Surgeon: Midge Minium, MD;  Location: [facility];  Service: Endoscopy;  Laterality: N/A;   TUBAL LIGATION     UPPER GASTROINTESTINAL ENDOSCOPY      Review of Systems:    All systems reviewed and negative except where noted in HPI.   Physical Examination:   BP 117/62   Pulse 70   Temp 97.7 F (36.5 C)   Ht 5\' 5"  (1.651 m)   Wt 100 lb 3.2 oz (45.5 kg)   BMI 16.67 kg/m   General: Well-nourished, well-developed in no acute distress.  Lungs: Clear to auscultation bilaterally. Non-labored. Heart: Regular rate and rhythm, no murmurs rubs or gallops.  Abdomen:  Bowel sounds are normal; Abdomen is Soft; No hepatosplenomegaly, masses or hernias;  No Abdominal Tenderness; No guarding or rebound tenderness. Neuro: Alert and oriented x 3.  Grossly intact.  Psych: Alert and cooperative, normal mood and affect.   Imaging Studies: VAS [place] ABI WITH/WO TBI Result Date: 09/16/2023  LOWER EXTREMITY DOPPLER STUDY Patient Name:  Yolanda Jimenez  Date of Exam:   09/13/2023 Medical Rec #: [id number]  Accession #:    [id number] Date of Birth: 1946/03/29       Patient Gender: F Patient Age:   64 years Exam Location:  [facility] Procedure:      VAS [place] ABI WITH/WO TBI Referring Phys: --------------------------------------------------------------------------------  Indications: Claudication, and peripheral artery disease. High Risk Factors: Hyperlipidemia.  Vascular Interventions: 08/23/2022 Left common femoral, profunda femoris, and                         superficial femoral artery endarterectomies                         2. Right common femoral, profunda femoris, and                         superficial femoral artery endarterectomies                         3. Aortogram and iliofemoral angiograms                         4. Use of the shockwave device for angioplasty and                         lithotripsy of the left common iliac artery with a 6 mm                         diameter by 6 cm length device                         5. Use of the shockwave device for angioplasty and                         lithotripsy of the right common iliac artery using an 8                         mm diameter by 6 cm length device                         6. Placement of an Endologix aortic stent graft with 20                         mm proximal and 13 mm distal stents for treatment of                         occlusive disease.                         7. Lifestream balloon expandable stent placement into                         the right common iliac artery with 10 mm diameter by 38                          mm length Lifestream stent                         8. Lifestream balloon expandable stent placement of the  left common iliac artery with 9 mm diameter by 58 mm                         length Lifestream stent. Comparison Study: 03/2023 Performing Technologist: Salvadore Farber RVT  Examination Guidelines: A complete evaluation includes at minimum, Doppler waveform signals and systolic blood pressure reading at the level of bilateral brachial, anterior tibial, and posterior tibial arteries, when vessel segments are accessible. Bilateral testing is considered an integral part of a complete examination. Photoelectric Plethysmograph (PPG) waveforms and toe systolic pressure readings are included as required and additional duplex testing as needed. Limited examinations for reoccurring indications may be performed as noted.  ABI Findings: +---------+------------------+-----+---------+--------+ Right    Rt Pressure (mmHg)IndexWaveform Comment  +---------+------------------+-----+---------+--------+ Brachial 137                                      +---------+------------------+-----+---------+--------+ PTA      151               1.10 biphasic          +---------+------------------+-----+---------+--------+ DP       156               1.14 triphasic         +---------+------------------+-----+---------+--------+ Great Toe54                0.39 Abnormal          +---------+------------------+-----+---------+--------+ +---------+------------------+-----+---------+-------+ Left     Lt Pressure (mmHg)IndexWaveform Comment +---------+------------------+-----+---------+-------+ Brachial 135                                     +---------+------------------+-----+---------+-------+ PTA      146               1.07 biphasic         +---------+------------------+-----+---------+-------+ DP       152               1.11 triphasic         +---------+------------------+-----+---------+-------+ Great Toe106               0.77 Normal           +---------+------------------+-----+---------+-------+ +-------+-----------+-----------+------------+------------+ ABI/TBIToday's ABIToday's TBIPrevious ABIPrevious TBI +-------+-----------+-----------+------------+------------+ Right  1.14       .39        .99         .38          +-------+-----------+-----------+------------+------------+ Left   1.11       .77        .61         .05          +-------+-----------+-----------+------------+------------+ Left ABIs and TBIs appear increased compared to prior study on 03/2023. Right ABIs appear increased.  *See table(s) above for measurements and observations.  Electronically signed by Festus Barren MD on 09/16/2023 at 7:01:17 AM.    Final    DG Chest Port 1 View Result Date: 08/26/2023 CLINICAL DATA:  Hypoxemia EXAM: PORTABLE CHEST - 1 VIEW COMPARISON:  08/25/2023 FINDINGS: Lungs are clear. Heart size and mediastinal contours are within normal limits. Aortic Atherosclerosis (ICD10-170.0). No effusion. Visualized bones unremarkable. IMPRESSION: No acute cardiopulmonary disease. Electronically Signed   By: Corlis Leak M.D.   On: 08/26/2023 12:19  DG Chest Port 1 View Result Date: 08/25/2023 CLINICAL DATA:  Dyspnea EXAM: PORTABLE CHEST 1 VIEW COMPARISON:  05/15/2018, 11/18/2022 FINDINGS: The heart size and mediastinal contours are within normal limits. Aortic atherosclerosis. No focal airspace consolidation, pleural effusion, or pneumothorax. The visualized skeletal structures are unremarkable. IMPRESSION: No active disease. Electronically Signed   By: Duanne Guess D.O.   On: 08/25/2023 13:29   DG C-Arm 1-60 Min-No Report Result Date: 08/24/2023 Fluoroscopy was utilized by the requesting physician.  No radiographic interpretation.   DG C-Arm 1-60 Min-No Report Result Date: 08/24/2023 Fluoroscopy was utilized by the requesting physician.   No radiographic interpretation.    Assessment and Plan:   LONISHA BOBBY is a 78 y.o. y/o female presents for follow-up of:  1.  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  Currently stable and Improving.  Continue Protonix 40 Mg daily.  Avoid NSAIDs.  2.  Acute on chronic worsening iron deficiency anemia from upper GI bleed requiring hospitalization 2/6 through 09/17/2023 and 2 Units PRBC.  Continue IV iron through hematology.  Order labs today: CBC, iron panel, ferritin  3.  Severe peripheral vascular disease s/p femoral endarterectomies with stent placement 08/24/2023.  Currently on Plavix and aspirin.  Doing well.  Leg pain has resolved.  4.  History of multiple adenomatous colon polyps.  Last colonoscopy 3 years ago showed 5 small (23mm-4mm) adenomatous polyps removed with NO dylplasia.  She likely needs to have repeat colonoscopy, however, need to wait 6 months until she is able to hold Plavix prior to procedure.  Also wait for Hemoglobin to improve.  Request advise from Dr. Wyn Quaker about timing of holding Plavix 5 days prior to Colonoscopy.  5.  Chronic diarrhea attributed to irritable bowel syndrome.  Stool studies 08/2023 showed negative C. difficile, negative GI pathogen panel, normal fecal calprotectin, normal pancreatic elastase.  Celiac panel labs negative.  Imodium (loperamide) 2 mg 1 or 2 tablets every 4 hours as needed, max 8 tablets/day.  FiberCon take 2 tablets twice daily.  Celso Amy, PA-C  Follow up in 5 weeks with TG.

## 2023-09-20 ENCOUNTER — Encounter: Payer: Self-pay | Admitting: Physician Assistant

## 2023-09-20 ENCOUNTER — Ambulatory Visit (INDEPENDENT_AMBULATORY_CARE_PROVIDER_SITE_OTHER): Payer: PPO | Admitting: Physician Assistant

## 2023-09-20 VITALS — BP 117/62 | HR 70 | Temp 97.7°F | Ht 65.0 in | Wt 100.2 lb

## 2023-09-20 DIAGNOSIS — D509 Iron deficiency anemia, unspecified: Secondary | ICD-10-CM | POA: Diagnosis not present

## 2023-09-20 DIAGNOSIS — Z860101 Personal history of adenomatous and serrated colon polyps: Secondary | ICD-10-CM

## 2023-09-20 DIAGNOSIS — K580 Irritable bowel syndrome with diarrhea: Secondary | ICD-10-CM

## 2023-09-20 DIAGNOSIS — K5520 Angiodysplasia of colon without hemorrhage: Secondary | ICD-10-CM

## 2023-09-20 DIAGNOSIS — K31811 Angiodysplasia of stomach and duodenum with bleeding: Secondary | ICD-10-CM | POA: Diagnosis not present

## 2023-09-20 NOTE — Patient Instructions (Addendum)
Take 2 Caplets with a full glass of water or other liquid (8 ounces/240 milliliters)  Once Daily.  Increase to 2 Caplets Twice daily if needed.

## 2023-09-21 ENCOUNTER — Telehealth (INDEPENDENT_AMBULATORY_CARE_PROVIDER_SITE_OTHER): Payer: Self-pay

## 2023-09-21 NOTE — Telephone Encounter (Signed)
She most definitely should still be on plavix

## 2023-09-21 NOTE — Telephone Encounter (Signed)
Patient called stating that she may have dreamed that someone had called her and told her to stop taking the Plavix. According to Dr.Dew notes she is still taking it and the Dr. She seen yesterday stated that she is still on Plavix currently being given by Dr.Dew.  I let her know that I didn't seen anything in her notes of being taken off nor did she have a phone note stating so.   Please very that she is still suppose to be taking it.

## 2023-09-21 NOTE — Telephone Encounter (Signed)
Message given

## 2023-09-23 DIAGNOSIS — D509 Iron deficiency anemia, unspecified: Secondary | ICD-10-CM | POA: Diagnosis not present

## 2023-09-24 LAB — CBC WITH DIFFERENTIAL/PLATELET
Basophils Absolute: 0 10*3/uL (ref 0.0–0.2)
Basos: 0 %
EOS (ABSOLUTE): 0.1 10*3/uL (ref 0.0–0.4)
Eos: 2 %
Hematocrit: 32 % — ABNORMAL LOW (ref 34.0–46.6)
Hemoglobin: 9.6 g/dL — ABNORMAL LOW (ref 11.1–15.9)
Immature Grans (Abs): 0 10*3/uL (ref 0.0–0.1)
Immature Granulocytes: 0 %
Lymphocytes Absolute: 0.6 10*3/uL — ABNORMAL LOW (ref 0.7–3.1)
Lymphs: 7 %
MCH: 28.7 pg (ref 26.6–33.0)
MCHC: 30 g/dL — ABNORMAL LOW (ref 31.5–35.7)
MCV: 96 fL (ref 79–97)
Monocytes Absolute: 0.6 10*3/uL (ref 0.1–0.9)
Monocytes: 7 %
Neutrophils Absolute: 7.2 10*3/uL — ABNORMAL HIGH (ref 1.4–7.0)
Neutrophils: 84 %
Platelets: 424 10*3/uL (ref 150–450)
RBC: 3.35 x10E6/uL — ABNORMAL LOW (ref 3.77–5.28)
RDW: 16.5 % — ABNORMAL HIGH (ref 11.7–15.4)
WBC: 8.6 10*3/uL (ref 3.4–10.8)

## 2023-09-24 LAB — IRON,TIBC AND FERRITIN PANEL
Ferritin: 91 ng/mL (ref 15–150)
Iron Saturation: 14 % — ABNORMAL LOW (ref 15–55)
Iron: 44 ug/dL (ref 27–139)
Total Iron Binding Capacity: 322 ug/dL (ref 250–450)
UIBC: 278 ug/dL (ref 118–369)

## 2023-09-26 ENCOUNTER — Encounter: Payer: Self-pay | Admitting: Physician Assistant

## 2023-09-26 ENCOUNTER — Inpatient Hospital Stay: Payer: PPO

## 2023-09-26 VITALS — BP 130/71 | HR 70 | Temp 97.0°F | Resp 16

## 2023-09-26 DIAGNOSIS — D500 Iron deficiency anemia secondary to blood loss (chronic): Secondary | ICD-10-CM

## 2023-09-26 DIAGNOSIS — D509 Iron deficiency anemia, unspecified: Secondary | ICD-10-CM | POA: Diagnosis not present

## 2023-09-26 MED ORDER — IRON SUCROSE 20 MG/ML IV SOLN
200.0000 mg | Freq: Once | INTRAVENOUS | Status: AC
  Administered 2023-09-26: 200 mg via INTRAVENOUS
  Filled 2023-09-26: qty 10

## 2023-09-26 NOTE — Patient Instructions (Signed)
 Iron Sucrose Injection What is this medication? IRON SUCROSE (EYE ern SOO krose) treats low levels of iron (iron deficiency anemia) in people with kidney disease. Iron is a mineral that plays an important role in making red blood cells, which carry oxygen from your lungs to the rest of your body. This medicine may be used for other purposes; ask your health care provider or pharmacist if you have questions. COMMON BRAND NAME(S): Venofer What should I tell my care team before I take this medication? They need to know if you have any of these conditions: Anemia not caused by low iron levels Heart disease High levels of iron in the blood Kidney disease Liver disease An unusual or allergic reaction to iron, other medications, foods, dyes, or preservatives Pregnant or trying to get pregnant Breastfeeding How should I use this medication? This medication is for infusion into a vein. It is given in a hospital or clinic setting. Talk to your care team about the use of this medication in children. While this medication may be prescribed for children as young as 2 years for selected conditions, precautions do apply. Overdosage: If you think you have taken too much of this medicine contact a poison control center or emergency room at once. NOTE: This medicine is only for you. Do not share this medicine with others. What if I miss a dose? Keep appointments for follow-up doses. It is important not to miss your dose. Call your care team if you are unable to keep an appointment. What may interact with this medication? Do not take this medication with any of the following: Deferoxamine Dimercaprol Other iron products This medication may also interact with the following: Chloramphenicol Deferasirox This list may not describe all possible interactions. Give your health care provider a list of all the medicines, herbs, non-prescription drugs, or dietary supplements you use. Also tell them if you smoke,  drink alcohol, or use illegal drugs. Some items may interact with your medicine. What should I watch for while using this medication? Visit your care team regularly. Tell your care team if your symptoms do not start to get better or if they get worse. You may need blood work done while you are taking this medication. You may need to follow a special diet. Talk to your care team. Foods that contain iron include: whole grains/cereals, dried fruits, beans, or peas, leafy green vegetables, and organ meats (liver, kidney). What side effects may I notice from receiving this medication? Side effects that you should report to your care team as soon as possible: Allergic reactions--skin rash, itching, hives, swelling of the face, lips, tongue, or throat Low blood pressure--dizziness, feeling faint or lightheaded, blurry vision Shortness of breath Side effects that usually do not require medical attention (report to your care team if they continue or are bothersome): Flushing Headache Joint pain Muscle pain Nausea Pain, redness, or irritation at injection site This list may not describe all possible side effects. Call your doctor for medical advice about side effects. You may report side effects to FDA at 1-800-FDA-1088. Where should I keep my medication? This medication is given in a hospital or clinic. It will not be stored at home. NOTE: This sheet is a summary. It may not cover all possible information. If you have questions about this medicine, talk to your doctor, pharmacist, or health care provider.  2024 Elsevier/Gold Standard (2022-12-31 00:00:00)

## 2023-09-26 NOTE — Progress Notes (Signed)
 Pt tolerated treatment without concerns.  VSS.  Pt refused 30 minute post observation.  Pt understands risks.

## 2023-10-03 ENCOUNTER — Inpatient Hospital Stay: Payer: PPO

## 2023-10-03 VITALS — BP 122/59 | HR 55 | Temp 97.5°F | Resp 16

## 2023-10-03 DIAGNOSIS — D509 Iron deficiency anemia, unspecified: Secondary | ICD-10-CM | POA: Diagnosis not present

## 2023-10-03 DIAGNOSIS — D500 Iron deficiency anemia secondary to blood loss (chronic): Secondary | ICD-10-CM

## 2023-10-03 MED ORDER — IRON SUCROSE 20 MG/ML IV SOLN
200.0000 mg | Freq: Once | INTRAVENOUS | Status: AC
  Administered 2023-10-03: 200 mg via INTRAVENOUS
  Filled 2023-10-03: qty 10

## 2023-10-03 NOTE — Progress Notes (Signed)
 Pt refused 30 minute post observation period.  VSS.  Pt understands risks.

## 2023-10-10 ENCOUNTER — Inpatient Hospital Stay: Payer: PPO | Attending: Internal Medicine

## 2023-10-10 VITALS — BP 101/58 | HR 58 | Temp 98.1°F | Resp 19

## 2023-10-10 DIAGNOSIS — D500 Iron deficiency anemia secondary to blood loss (chronic): Secondary | ICD-10-CM

## 2023-10-10 DIAGNOSIS — D509 Iron deficiency anemia, unspecified: Secondary | ICD-10-CM | POA: Diagnosis not present

## 2023-10-10 MED ORDER — IRON SUCROSE 20 MG/ML IV SOLN
200.0000 mg | Freq: Once | INTRAVENOUS | Status: AC
  Administered 2023-10-10: 200 mg via INTRAVENOUS

## 2023-10-17 ENCOUNTER — Inpatient Hospital Stay: Payer: PPO

## 2023-10-17 ENCOUNTER — Inpatient Hospital Stay

## 2023-10-17 NOTE — Progress Notes (Unsigned)
 Celso Amy, PA-C [address]  [address]  [place], [place] [address]  Main: [phone]  Fax: [phone]   Primary Care Physician: Danella Penton, MD  Primary Gastroenterologist:  Celso Amy, PA-C   CC:  Follow-up chronic iron deficiency anemia, GI bleed, diarrhea.   HPI: Yolanda Jimenez is a 78 y.o. 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  She has history of GERD and Barrett's esophagus.  She receives IV iron infusions through Dr. Alena Bills.     Past medical history of A-fib, arthritis, asthma, AVM, GERD, Barrett's esophagus, carotid stenosis, COPD, diabetes, GERD, IBS, PVD, history of C. difficile (2012).  Patient underwent bilateral femoral endarterectomies with stent placement 08/24/2023 by Dr. Wyn Quaker due to blood clots and severe peripheral vascular disease.  Currently takes Plavix and aspirin daily.   She was admitted to [facility] 09/15/2023 until 09/17/2023 for upper GI bleed and worsening anemia (hemoglobin 6.1).  Received 2 units PRBC.  EGD 09/16/2023 by Dr. Servando Snare showed normal esophagus and stomach.  There were multiple small angiodysplastic lesions with signs of recent bleeding in the entire duodenum which were coagulated with argon plasma.  Currently on Protonix 40 Mg once daily.  Last labs 09/17/2023 showed low yet improved hemoglobin 8.4.   Current Symptoms: She has not had any black stools since hospital discharge. Denies hematochezia or melena.  Energy is improving.    Stool studies 08/22/2023: Negative C. difficile, negative GI pathogen panel, normal fecal calprotectin, normal pancreatic elastase.  09/23/23 Most Recent Labs: Hgb 9.6g, MCV 96, Plt 424, Total Iron 44, Iron sat 14%, Ferritin 91.   08/15/2023: Celiac panel labs negative. 05/2023: Vitamin B12 normal (801).   GI History: -09/16/23 EGD:  Multiple small angiodysplastic lesions with signs of recent bleeding in the entire duodenum  which were coagulated with argon plasma. Normal stomach and esophagus. -01/2019 colonoscopy: 15 mm tubular adenoma polyp removed from ascending colon.  3 mm polyp removed from ascending colon.  Excellent prep.  Diverticulosis and internal hemorrhoids.  Biopsies were negative for microscopic colitis and IBD. -06/2020 colonoscopy: 5 small (2 mm to 4 mm) tubular adenoma and 1 hyperplastic polyps removed from throughout the colon.  Diverticulosis.  Excellent prep.  No further repeat colonoscopies were recommended due to advanced age. -08/2017 EGD: LA grade A esophagitis, small hiatal hernia, normal stomach and duodenum.  Biopsy showed Barrett's mucosa with no dysplasia.  GERD is controlled on Prilosec 40 Mg once daily.  Current Outpatient Medications  Medication Sig Dispense Refill   acetaminophen (TYLENOL) 500 MG tablet Take 1,000 mg by mouth every 6 (six) hours as needed.     aspirin EC 81 MG tablet Take 1 tablet (81 mg total) by mouth daily. Swallow whole. 150 tablet 2   buPROPion (WELLBUTRIN) 75 MG tablet Take 75 mg by mouth every morning.     Cholecalciferol 125 MCG (5000 UT) TABS Take 1 tablet by mouth daily.     clopidogrel (PLAVIX) 75 MG tablet Take 1 tablet (75 mg total) by mouth daily. 30 tablet 6   cyanocobalamin 1000 MCG tablet Take 1,000 mcg by mouth daily.     denosumab (PROLIA) 60 MG/ML SOSY injection Inject 60 mg into the skin every 6 (six) months.     donepezil (ARICEPT) 5 MG tablet Take 1 tablet by mouth at bedtime.     Ferrous Gluconate 239 (27 Fe) MG TABS Take 1 tablet by mouth  daily.     fluticasone (FLONASE) 50 MCG/ACT nasal spray Place 1 spray into both nostrils daily as needed for allergies or rhinitis.     loperamide (IMODIUM) 2 MG capsule Take 2 mg by mouth every morning.     metoprolol succinate (TOPROL-XL) 25 MG 24 hr tablet Take 1 tablet by mouth daily.     pantoprazole (PROTONIX) 40 MG tablet Take 40 mg by mouth daily.     rosuvastatin (CRESTOR) 20 MG tablet Take 20 mg by  mouth daily.     No current facility-administered medications for this visit.    Allergies as of 10/18/2023 - Review Complete 10/18/2023  Allergen Reaction Noted   Codeine  08/23/2008   Hydrocodone Other (See Comments) 10/14/2022   Hydrocodone-acetaminophen Other (See Comments) 04/25/2015   Lidocaine  09/14/2018   Nsaids Other (See Comments) 05/15/2018   Oxycodone  10/21/2020    Past Medical History:  Diagnosis Date   A-fib [facility])    a.) CHA2DS2-VASc = 5 (age x2, sex, vascular disease history, T2DM) as of 08/23/2023; b.) cardiac rate/rhythm maintained on oral metoprolol succinate; no chronic OAC   Abnormal vaginal Pap smear 2015   Anxiety    a.) on BZO PRN (diazepam)   Aortic atherosclerosis (HCC)    Aortic stenosis 05/03/2023   a.) TTE 05/03/2023: mild AS (MPG 12 mmHg; AVA = 1.3 cm2)   Arthritis of spine    Asthma    Barrett esophagus    Benign hematuria 08/29/2014   W/u neg  Normal renal ultrasound 2/19  Cystoscopy normal 2019, [place]  CT -9/24     Bilateral carotid artery disease (HCC)    a.) carotid dopplers 10/13/2016, 10/24/2018, 10/26/2019, 10/21/2020, 10/22/2022: 1-39% BICA   C. difficile diarrhea    CAD (coronary artery disease)    a.) CT chest 07/12/2018: LAD calcifications; b.) CT CAP 11/18/2022: 3 vessel CAD; c.) cPET 05/19/2023: severe 4 vessel coronary calcs mainly in LAD distribution; d.) cPET 05/03/2023: small/mild reversible defect in mid-anterosep/ant region   Complication of anesthesia    a.) delayed emergence; b.) emergence delirium; "I get really loopy after surgery"   COPD (chronic obstructive pulmonary disease) (HCC)    DDD (degenerative disc disease), lumbosacral    Depression    Diverticulosis    Epigastric hernia    GERD (gastroesophageal reflux disease)    Glaucoma    Heart murmur    Heart palpitations    History of bilateral cataract extraction 2015   History of recurrent UTIs    Hx of adenomatous colonic polyps    Hyperlipidemia    IBS  (irritable bowel syndrome)    Insomnia    Iron deficiency anemia    Kidney cysts    Long term current use of aspirin    Macular degeneration of left eye    Osteoporosis    a.) on RANKLi (denosumab)   PVD (peripheral vascular disease) with claudication (HCC)    Small bowel arteriovenous malformation 2007   a.) noted on VCE in 2007   Type 2 diabetes, diet controlled (HCC)    Wears dentures    partial lower    Past Surgical History:  Procedure Laterality Date   ABDOMINAL AORTIC ENDOVASCULAR STENT GRAFT N/A 08/24/2023   Procedure: ABDOMINAL AORTIC ENDOVASCULAR STENT GRAFT;  Surgeon: Annice Needy, MD;  Location: [facility];  Service: Vascular;  Laterality: N/A;   BUNIONECTOMY Right    CATARACT EXTRACTION Bilateral    CERVICAL CONE BIOPSY     CHOLECYSTECTOMY  COLONOSCOPY     COLONOSCOPY WITH PROPOFOL N/A 01/22/2019   Procedure: COLONOSCOPY WITH BIOPSY;  Surgeon: Midge Minium, MD;  Location: [facility];  Service: Endoscopy;  Laterality: N/A;  diabetic - diet controlled   COLONOSCOPY WITH PROPOFOL N/A 06/27/2020   Procedure: COLONOSCOPY WITH BIOPSY;  Surgeon: Midge Minium, MD;  Location: [facility];  Service: Endoscopy;  Laterality: N/A;  priority 4   ENDARTERECTOMY FEMORAL Bilateral 08/24/2023   Procedure: ENDARTERECTOMY FEMORAL;  Surgeon: Annice Needy, MD;  Location: [facility];  Service: Vascular;  Laterality: Bilateral;   ESOPHAGOGASTRODUODENOSCOPY (EGD) WITH PROPOFOL N/A 08/29/2017   Procedure: ESOPHAGOGASTRODUODENOSCOPY (EGD) WITH PROPOFOL;  Surgeon: Midge Minium, MD;  Location: [facility];  Service: Endoscopy;  Laterality: N/A;   ESOPHAGOGASTRODUODENOSCOPY (EGD) WITH PROPOFOL N/A 09/16/2023   Procedure: ESOPHAGOGASTRODUODENOSCOPY (EGD) WITH PROPOFOL;  Surgeon: Midge Minium, MD;  Location: [facility] ENDOSCOPY;  Service: Endoscopy;  Laterality: N/A;   FOOT SURGERY     right 2nd toe   HIP ARTHROPLASTY Left 05/16/2018   Procedure: ARTHROPLASTY BIPOLAR HIP  (HEMIARTHROPLASTY);  Surgeon: Christena Flake, MD;  Location: [facility];  Service: Orthopedics;  Laterality: Left;   HOT HEMOSTASIS  09/16/2023   Procedure: HOT HEMOSTASIS (ARGON PLASMA COAGULATION/BICAP);  Surgeon: Midge Minium, MD;  Location: [facility] END[facility]COPY;  Service: Endoscopy;;   INSERTION OF ILIAC STENT Bilateral 08/24/2023   Procedure: INSERTION OF ILIAC STENT;  Surgeon: Annice Needy, MD;  Location: [facility];  Service: Vascular;  Laterality: Bilateral;   LOWER EXTREMITY ANGIOGRAPHY Left 04/14/2023   Procedure: Lower Extremity Angiography;  Surgeon: Annice Needy, MD;  Location: [facility];  Service: Cardiovascular;  Laterality: Left;   ORIF PERIPROSTHETIC FRACTURE Left 12/10/2021   Procedure: OPEN REDUCTION INTERNAL FIXATION OF A LEFT GREATER TROCHANTERIC FRACTURE;  Surgeon: Christena Flake, MD;  Location: [facility];  Service: Orthopedics;  Laterality: Left;   ORIF WRIST FRACTURE Right 10/07/2020   Procedure: OPEN REDUCTION INTERNAL FIXATION (ORIF) WRIST FRACTURE;  Surgeon: Kennedy Bucker, MD;  Location: [facility];  Service: Orthopedics;  Laterality: Right;   POLYPECTOMY N/A 01/22/2019   Procedure: POLYPECTOMY;  Surgeon: Midge Minium, MD;  Location: [facility];  Service: Endoscopy;  Laterality: N/A;  2 Clips placed at polyp removal site in ascending colon   POLYPECTOMY N/A 06/27/2020   Procedure: POLYPECTOMY;  Surgeon: Midge Minium, MD;  Location: [facility];  Service: Endoscopy;  Laterality: N/A;   TUBAL LIGATION     UPPER GASTROINTESTINAL ENDOSCOPY      Review of Systems:    All systems reviewed and negative except where noted in HPI.   Physical Examination:   BP 127/71   Pulse 87   Temp 97.7 F (36.5 C)   Ht 5\' 5"  (1.651 m)   Wt 94 lb 9.6 oz (42.9 kg)   BMI 15.74 kg/m   General: Well-nourished, well-developed in no acute distress.  Neuro: Alert and oriented x 3.  Grossly intact.  Psych: Alert and cooperative, normal mood and affect.   Imaging  Studies: No results found.  Assessment and Plan:   Yolanda Jimenez is a 78 y.o. y/o female returns for followup of:  1.  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  Currently stable and Improving.             Continue Protonix 40 Mg daily.             Avoid NSAIDs.   2.  Acute on chronic worsening iron deficiency anemia from upper GI bleed requiring hospitalization 2/6 through 09/17/2023 and 2 Units PRBC.             Continue IV iron through hematology.             Order labs today: CBC   3.  Severe peripheral vascular disease s/p femoral endarterectomies with stent placement 08/24/2023.  Currently on Plavix and aspirin.    Continue follow-up with vascular surgeon Dr. Wyn Quaker.   4.  History of multiple adenomatous colon polyps.  Last colonoscopy 3 years ago showed 5 small (36mm-4mm) adenomatous polyps removed with NO dylplasia.  She likely needs to have repeat colonoscopy, however, need to wait at least 6 months until she is able to hold Plavix prior to procedure.  Also wait for Hemoglobin to improve.              5.  Chronic diarrhea attributed to irritable bowel syndrome.                    Start FiberCon take 2 tablets twice daily.  Celso Amy, PA-C  Follow up in 6 months.

## 2023-10-18 ENCOUNTER — Encounter: Payer: Self-pay | Admitting: Physician Assistant

## 2023-10-18 ENCOUNTER — Ambulatory Visit: Payer: PPO | Admitting: Physician Assistant

## 2023-10-18 VITALS — BP 127/71 | HR 87 | Temp 97.7°F | Ht 65.0 in | Wt 94.6 lb

## 2023-10-18 DIAGNOSIS — K580 Irritable bowel syndrome with diarrhea: Secondary | ICD-10-CM

## 2023-10-18 DIAGNOSIS — Z860101 Personal history of adenomatous and serrated colon polyps: Secondary | ICD-10-CM | POA: Diagnosis not present

## 2023-10-18 DIAGNOSIS — D509 Iron deficiency anemia, unspecified: Secondary | ICD-10-CM | POA: Diagnosis not present

## 2023-10-18 DIAGNOSIS — K2100 Gastro-esophageal reflux disease with esophagitis, without bleeding: Secondary | ICD-10-CM

## 2023-10-18 DIAGNOSIS — K5520 Angiodysplasia of colon without hemorrhage: Secondary | ICD-10-CM | POA: Diagnosis not present

## 2023-10-18 DIAGNOSIS — Z860100 Personal history of colon polyps, unspecified: Secondary | ICD-10-CM

## 2023-10-18 DIAGNOSIS — K2270 Barrett's esophagus without dysplasia: Secondary | ICD-10-CM

## 2023-10-19 ENCOUNTER — Telehealth: Payer: Self-pay

## 2023-10-19 ENCOUNTER — Inpatient Hospital Stay

## 2023-10-19 VITALS — BP 109/58 | HR 73 | Temp 98.5°F | Resp 18

## 2023-10-19 DIAGNOSIS — D500 Iron deficiency anemia secondary to blood loss (chronic): Secondary | ICD-10-CM

## 2023-10-19 DIAGNOSIS — D509 Iron deficiency anemia, unspecified: Secondary | ICD-10-CM | POA: Diagnosis not present

## 2023-10-19 LAB — CBC WITH DIFFERENTIAL/PLATELET
Basophils Absolute: 0 10*3/uL (ref 0.0–0.2)
Basos: 0 %
EOS (ABSOLUTE): 0.1 10*3/uL (ref 0.0–0.4)
Eos: 1 %
Hematocrit: 27.8 % — ABNORMAL LOW (ref 34.0–46.6)
Hemoglobin: 8.1 g/dL — ABNORMAL LOW (ref 11.1–15.9)
Immature Grans (Abs): 0.1 10*3/uL (ref 0.0–0.1)
Immature Granulocytes: 1 %
Lymphocytes Absolute: 0.7 10*3/uL (ref 0.7–3.1)
Lymphs: 7 %
MCH: 30.3 pg (ref 26.6–33.0)
MCHC: 29.1 g/dL — ABNORMAL LOW (ref 31.5–35.7)
MCV: 104 fL — ABNORMAL HIGH (ref 79–97)
Monocytes Absolute: 0.8 10*3/uL (ref 0.1–0.9)
Monocytes: 8 %
Neutrophils Absolute: 8.6 10*3/uL — ABNORMAL HIGH (ref 1.4–7.0)
Neutrophils: 83 %
Platelets: 452 10*3/uL — ABNORMAL HIGH (ref 150–450)
RBC: 2.67 x10E6/uL — CL (ref 3.77–5.28)
RDW: 17.8 % — ABNORMAL HIGH (ref 11.7–15.4)
WBC: 10.2 10*3/uL (ref 3.4–10.8)

## 2023-10-19 MED ORDER — SODIUM CHLORIDE 0.9% FLUSH
10.0000 mL | Freq: Once | INTRAVENOUS | Status: AC | PRN
  Administered 2023-10-19: 10 mL
  Filled 2023-10-19: qty 10

## 2023-10-19 MED ORDER — IRON SUCROSE 20 MG/ML IV SOLN
200.0000 mg | Freq: Once | INTRAVENOUS | Status: AC
  Administered 2023-10-19: 200 mg via INTRAVENOUS
  Filled 2023-10-19: qty 10

## 2023-10-19 NOTE — Progress Notes (Signed)
 Patient declined to wait the 30 minutes for post iron infusion observation today. Tolerated infusion well. VSS.

## 2023-10-19 NOTE — Progress Notes (Signed)
 Call and notify patient her hemoglobin has dropped from 9.6 down to 8.1 in the past 4 weeks.  Please refer patient back to Dr. Alena Bills, hematologist, to discuss repeat IV iron.  I also recommend order vitamin B12, folate, and iron panel labs.  Diagnosis: anemia.  Continue Protonix 40 Mg once daily.  If patient develops rectal bleeding, black tarry stools, or severe weakness, then she should go to the ED.  If Hgb falls below 7g then she will need a blood transfusion.  May also need repeat EGD in hospital. Celso Amy, PA-C

## 2023-10-19 NOTE — Telephone Encounter (Signed)
 Patient is continuing to have IV iron infusions- she has appointment with DrAgrawal next week also. She is taking pantoprazole daily.  Call and notify patient her hemoglobin has dropped from 9.6 down to 8.1 in the past 4 weeks.  Please refer patient back to Dr. Alena Bills, hematologist, to discuss repeat IV iron.  I also recommend order vitamin B12, folate, and iron panel labs.  Diagnosis: anemia.  Continue Protonix 40 Mg once daily.  If patient develops rectal bleeding, black tarry stools, or severe weakness, then she should go to the ED.  If Hgb falls below 7g then she will need a blood transfusion.  May also need repeat EGD in hospital.  Celso Amy, PA-C

## 2023-10-21 DIAGNOSIS — R399 Unspecified symptoms and signs involving the genitourinary system: Secondary | ICD-10-CM | POA: Diagnosis not present

## 2023-10-24 DIAGNOSIS — M818 Other osteoporosis without current pathological fracture: Secondary | ICD-10-CM | POA: Diagnosis not present

## 2023-10-25 ENCOUNTER — Telehealth: Payer: Self-pay

## 2023-10-25 ENCOUNTER — Inpatient Hospital Stay: Payer: PPO

## 2023-10-25 ENCOUNTER — Encounter: Payer: Self-pay | Admitting: Internal Medicine

## 2023-10-25 ENCOUNTER — Inpatient Hospital Stay

## 2023-10-25 ENCOUNTER — Inpatient Hospital Stay (HOSPITAL_BASED_OUTPATIENT_CLINIC_OR_DEPARTMENT_OTHER): Payer: PPO | Admitting: Internal Medicine

## 2023-10-25 VITALS — BP 102/52 | HR 49 | Temp 96.8°F | Resp 16 | Wt 95.2 lb

## 2023-10-25 DIAGNOSIS — D539 Nutritional anemia, unspecified: Secondary | ICD-10-CM

## 2023-10-25 DIAGNOSIS — D500 Iron deficiency anemia secondary to blood loss (chronic): Secondary | ICD-10-CM

## 2023-10-25 DIAGNOSIS — D509 Iron deficiency anemia, unspecified: Secondary | ICD-10-CM

## 2023-10-25 LAB — CBC WITH DIFFERENTIAL/PLATELET
Abs Immature Granulocytes: 0.07 10*3/uL (ref 0.00–0.07)
Basophils Absolute: 0 10*3/uL (ref 0.0–0.1)
Basophils Relative: 0 %
Eosinophils Absolute: 0 10*3/uL (ref 0.0–0.5)
Eosinophils Relative: 0 %
HCT: 25.9 % — ABNORMAL LOW (ref 36.0–46.0)
Hemoglobin: 7.5 g/dL — ABNORMAL LOW (ref 12.0–15.0)
Immature Granulocytes: 1 %
Lymphocytes Relative: 4 %
Lymphs Abs: 0.4 10*3/uL — ABNORMAL LOW (ref 0.7–4.0)
MCH: 31.6 pg (ref 26.0–34.0)
MCHC: 29 g/dL — ABNORMAL LOW (ref 30.0–36.0)
MCV: 109.3 fL — ABNORMAL HIGH (ref 80.0–100.0)
Monocytes Absolute: 0.4 10*3/uL (ref 0.1–1.0)
Monocytes Relative: 5 %
Neutro Abs: 8.5 10*3/uL — ABNORMAL HIGH (ref 1.7–7.7)
Neutrophils Relative %: 90 %
Platelets: 396 10*3/uL (ref 150–400)
RBC: 2.37 MIL/uL — ABNORMAL LOW (ref 3.87–5.11)
RDW: 22.1 % — ABNORMAL HIGH (ref 11.5–15.5)
WBC: 9.4 10*3/uL (ref 4.0–10.5)
nRBC: 0.3 % — ABNORMAL HIGH (ref 0.0–0.2)

## 2023-10-25 LAB — IRON AND TIBC
Iron: 29 ug/dL (ref 28–170)
Saturation Ratios: 8 % — ABNORMAL LOW (ref 10.4–31.8)
TIBC: 350 ug/dL (ref 250–450)
UIBC: 321 ug/dL

## 2023-10-25 LAB — FERRITIN: Ferritin: 131 ng/mL (ref 11–307)

## 2023-10-25 LAB — VITAMIN B12: Vitamin B-12: 464 pg/mL (ref 180–914)

## 2023-10-25 LAB — RETICULOCYTES
Immature Retic Fract: 39.9 % — ABNORMAL HIGH (ref 2.3–15.9)
RBC.: 2.4 MIL/uL — ABNORMAL LOW (ref 3.87–5.11)
Retic Count, Absolute: 170.4 10*3/uL (ref 19.0–186.0)
Retic Ct Pct: 7.1 % — ABNORMAL HIGH (ref 0.4–3.1)

## 2023-10-25 LAB — LACTATE DEHYDROGENASE: LDH: 126 U/L (ref 98–192)

## 2023-10-25 LAB — FOLATE: Folate: 11.1 ng/mL (ref >5.9)

## 2023-10-25 NOTE — Telephone Encounter (Signed)
 Called and spoke with patient in regards to Dr. Alena Bills wanting patient to have a bone marrow biopsy. Patient is scheduled for the procedure to be done on 11/07/2023 at 8:30 am with arrival time at 7:30 am, and she knows to report to the heart and vascular department. I told her that she needed to have someone drive her, and npo after midnight prior to the procedure. Patient understood and agreed with this plan.

## 2023-10-25 NOTE — Progress Notes (Signed)
 Patient had vascular surgery in 08/2023.  Since surgery she is feeling off balance that is slowly improving.  Dizziness in AM that does improve during the day.  New symptom first 2 fingers feeling numb.  Also having new tachycardic episodes in the AM and with exertion for past 2-3 weeks.

## 2023-10-25 NOTE — Progress Notes (Signed)
 [facility]  [phone] [phone]  ID: Yolanda Jimenez OB: 02-01-1946  MR#: [id number]  UYQ#:[id number]  Patient Care Team: Danella Penton, MD as PCP - General (Unknown Physician Specialty) Michaelyn Barter, MD as Consulting Physician (Oncology)   HPI: Yolanda Jimenez is a 78 y.o. female with past medical history of A-fib, arthritis, asthma, AVM, Barrett's esophagus, carotid stenosis, COPD, diabetes, GERD, IBS, PVD referred to hematology for management of iron deficiency anemia.  Patient reports longstanding history of iron deficiency anemia.  She has been on for count for many years.  Recently has been going through depression due to multiple difficult events happening in her life.  As a result she stopped her iron supplements.  She went back on it 3 to 4 days ago. Reports with her history of IBS sometimes she has voluminous and mushy stools.  After frequent wiping can notice some blood on the tissue paper at the end.   Colonoscopy from 06/27/2020 by Dr. Servando Snare showed multiple polyps and diverticulosis.  Pathology showed tubular adenoma x 5 and hyperplastic polyp x 1.  Follows with Dr. Apolinar Junes of urology for microscopic hematuria.  Had CT scans and urine cytology which was unremarkable.[name]  Has nocturia and was started on Mybetriq.    PVD - s/p bilateral femoral endarterectomies with stent placement on 08/24/2023 by Dr. Wyn Quaker.  Preop hemoglobin was 10.5.  09/15/23 -sent to ER for hemoglobin 6.1. s/p upper endoscopy with Dr. 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  Recommended colonoscopy in 6 months when she is able to hold Plavix.  10/25/23 -completed IV Venofer x 4 doses.  Hemoglobin still low at 7.5.  Interval history Patient seen today as follow-up for anemia and labs. Patient reports improvement in her right leg pain after the stent placement.  She reports dizziness which is positional.  Denies any  shortness of breath, chest pain.  REVIEW OF SYSTEMS:   ROS  As per HPI. Otherwise, a complete review of systems is negative.  PAST MEDICAL HISTORY: Past Medical History:  Diagnosis Date   A-fib [facility])    a.) CHA2DS2-VASc = 5 (age x2, sex, vascular disease history, T2DM) as of 08/23/2023; b.) cardiac rate/rhythm maintained on oral metoprolol succinate; no chronic OAC   Abnormal vaginal Pap smear 2015   Anxiety    a.) on BZO PRN (diazepam)   Aortic atherosclerosis (HCC)    Aortic stenosis 05/03/2023   a.) TTE 05/03/2023: mild AS (MPG 12 mmHg; AVA = 1.3 cm2)   Arthritis of spine    Asthma    Barrett esophagus    Benign hematuria 08/29/2014   W/u neg  Normal renal ultrasound 2/19  Cystoscopy normal 2019, [place]  CT -9/24     Bilateral carotid artery disease (HCC)    a.) carotid dopplers 10/13/2016, 10/24/2018, 10/26/2019, 10/21/2020, 10/22/2022: 1-39% BICA   C. difficile diarrhea    CAD (coronary artery disease)    a.) CT chest 07/12/2018: LAD calcifications; b.) CT CAP 11/18/2022: 3 vessel CAD; c.) cPET 05/19/2023: severe 4 vessel coronary calcs mainly in LAD distribution; d.) cPET 05/03/2023: small/mild reversible defect in mid-anterosep/ant region   Complication of anesthesia    a.) delayed emergence; b.) emergence delirium; "I get really loopy after surgery"   COPD (chronic obstructive pulmonary disease) (HCC)    DDD (degenerative disc disease), lumbosacral    Depression    Diverticulosis    Epigastric hernia    GERD (gastroesophageal reflux disease)  Glaucoma    Heart murmur    Heart palpitations    History of bilateral cataract extraction 2015   History of recurrent UTIs    Hx of adenomatous colonic polyps    Hyperlipidemia    IBS (irritable bowel syndrome)    Insomnia    Iron deficiency anemia    Kidney cysts    Long term current use of aspirin    Macular degeneration of left eye    Osteoporosis    a.) on RANKLi (denosumab)   PVD (peripheral vascular disease)  with claudication (HCC)    Small bowel arteriovenous malformation 2007   a.) noted on VCE in 2007   Type 2 diabetes, diet controlled (HCC)    Wears dentures    partial lower    PAST SURGICAL HISTORY: Past Surgical History:  Procedure Laterality Date   ABDOMINAL AORTIC ENDOVASCULAR STENT GRAFT N/A 08/24/2023   Procedure: ABDOMINAL AORTIC ENDOVASCULAR STENT GRAFT;  Surgeon: Annice Needy, MD;  Location: [facility] ORS;  Service: Vascular;  Laterality: N/A;   BUNIONECTOMY Right    CATARACT EXTRACTION Bilateral    CERVICAL CONE BIOPSY     CHOLECYSTECTOMY     COLONOSCOPY     COLONOSCOPY WITH PROPOFOL N/A 01/22/2019   Procedure: COLONOSCOPY WITH BIOPSY;  Surgeon: Midge Minium, MD;  Location: [facility];  Service: Endoscopy;  Laterality: N/A;  diabetic - diet controlled   COLONOSCOPY WITH PROPOFOL N/A 06/27/2020   Procedure: COLONOSCOPY WITH BIOPSY;  Surgeon: Midge Minium, MD;  Location: [facility];  Service: Endoscopy;  Laterality: N/A;  priority 4   ENDARTERECTOMY FEMORAL Bilateral 08/24/2023   Procedure: ENDARTERECTOMY FEMORAL;  Surgeon: Annice Needy, MD;  Location: [facility] ORS;  Service: Vascular;  Laterality: Bilateral;   ESOPHAGOGASTRODUODENOSCOPY (EGD) WITH PROPOFOL N/A 08/29/2017   Procedure: ESOPHAGOGASTRODUODENOSCOPY (EGD) WITH PROPOFOL;  Surgeon: Midge Minium, MD;  Location: [facility];  Service: Endoscopy;  Laterality: N/A;   ESOPHAGOGASTRODUODENOSCOPY (EGD) WITH PROPOFOL N/A 09/16/2023   Procedure: ESOPHAGOGASTRODUODENOSCOPY (EGD) WITH PROPOFOL;  Surgeon: Midge Minium, MD;  Location: [facility] ENDOSCOPY;  Service: Endoscopy;  Laterality: N/A;   FOOT SURGERY     right 2nd toe   HIP ARTHROPLASTY Left 05/16/2018   Procedure: ARTHROPLASTY BIPOLAR HIP (HEMIARTHROPLASTY);  Surgeon: Christena Flake, MD;  Location: [facility] ORS;  Service: Orthopedics;  Laterality: Left;   HOT HEMOSTASIS  09/16/2023   Procedure: HOT HEMOSTASIS (ARGON PLASMA COAGULATION/BICAP);  Surgeon: Midge Minium,  MD;  Location: [facility] ENDOSCOPY;  Service: Endoscopy;;   INSERTION OF ILIAC STENT Bilateral 08/24/2023   Procedure: INSERTION OF ILIAC STENT;  Surgeon: Annice Needy, MD;  Location: [facility] OR[facility];  Service: Vascular;  Laterality: Bilateral;   LOWER EXTREMITY ANGIOGRAPHY Left 04/14/2023   Procedure: Lower Extremity Angiography;  Surgeon: Annice Needy, MD;  Location: [facility] INVASIVE CV LAB;  Service: Cardiovascular;  Laterality: Left;   ORIF PERIPROSTHETIC FRACTURE Left 12/10/2021   Procedure: OPEN REDUCTION INTERNAL FIXATION OF A LEFT GREATER TROCHANTERIC FRACTURE;  Surgeon: Christena Flake, MD;  Location: [facility] OR[facility];  Service: Orthopedics;  Laterality: Left;   ORIF WRIST FRACTURE Right 10/07/2020   Procedure: OPEN REDUCTION INTERNAL FIXATION (ORIF) WRIST FRACTURE;  Surgeon: Kennedy Bucker, MD;  Location: [facility] OR[facility];  Service: Orthopedics;  Laterality: Right;   POLYPECTOMY N/A 01/22/2019   Procedure: POLYPECTOMY;  Surgeon: Midge Minium, MD;  Location: [facility];  Service: Endoscopy;  Laterality: N/A;  2 Clips placed at polyp removal site in ascending colon   POLYPECTOMY N/A 06/27/2020  Procedure: POLYPECTOMY;  Surgeon: Midge Minium, MD;  Location: [facility];  Service: Endoscopy;  Laterality: N/A;   TUBAL LIGATION     UPPER GASTROINTESTINAL ENDOSCOPY      FAMILY HISTORY: Family History  Problem Relation Age of Onset   Diabetes Mother    Heart disease Mother    Hypertension Mother    Diabetes Son    Diabetes Sister    Inflammatory bowel disease Father        diverticulitis   Multiple myeloma Father    Hypertension Son    Diabetes Maternal Grandmother    Colon cancer Neg Hx    Esophageal cancer Neg Hx    Rectal cancer Neg Hx    Stomach cancer Neg Hx    Breast cancer Neg Hx     HEALTH MAINTENANCE: Social History   Tobacco Use   Smoking status: Every Day    Current packs/day: 0.50    Average packs/day: 0.5 packs/day for 55.0 years (27.5 ttl pk-yrs)    Types: Cigarettes    Smokeless tobacco: Never   Tobacco comments:    she has patches to start just not started yet. smoked since teenager.  Vaping Use   Vaping status: Never Used  Substance Use Topics   Alcohol use: Yes    Alcohol/week: 2.0 standard drinks of alcohol    Types: 2 Glasses of wine per week    Comment: occ wine 1-2 weekly   Drug use: No     Allergies  Allergen Reactions   Codeine     "felt drunk, crazy"   Hydrocodone Other (See Comments)   Hydrocodone-Acetaminophen Other (See Comments)    Confusion/delirium   Lidocaine     Heart racing, increased BP - from local at dentist (likely epi)   Nsaids Other (See Comments)    Avoids due to IBS symptoms (bleeding symptoms)   Oxycodone     Confusion/delirium    Current Outpatient Medications  Medication Sig Dispense Refill   acetaminophen (TYLENOL) 500 MG tablet Take 1,000 mg by mouth every 6 (six) hours as needed.     aspirin EC 81 MG tablet Take 1 tablet (81 mg total) by mouth daily. Swallow whole. 150 tablet 2   buPROPion (WELLBUTRIN) 75 MG tablet Take 75 mg by mouth every morning.     Cholecalciferol 125 MCG (5000 UT) TABS Take 1 tablet by mouth daily.     clopidogrel (PLAVIX) 75 MG tablet Take 1 tablet (75 mg total) by mouth daily. 30 tablet 6   cyanocobalamin 1000 MCG tablet Take 1,000 mcg by mouth daily.     denosumab (PROLIA) 60 MG/ML SOSY injection Inject 60 mg into the skin every 6 (six) months.     donepezil (ARICEPT) 5 MG tablet Take 1 tablet by mouth at bedtime.     Ferrous Gluconate 239 (27 Fe) MG TABS Take 1 tablet by mouth daily.     fluticasone (FLONASE) 50 MCG/ACT nasal spray Place 1 spray into both nostrils daily as needed for allergies or rhinitis.     loperamide (IMODIUM) 2 MG capsule Take 2 mg by mouth every morning.     metoprolol succinate (TOPROL-XL) 25 MG 24 hr tablet Take 1 tablet by mouth daily.     pantoprazole (PROTONIX) 40 MG tablet Take 40 mg by mouth daily.     rosuvastatin (CRESTOR) 20 MG tablet Take 20 mg  by mouth daily.     No current facility-administered medications for this visit.    OBJECTIVE: Vitals:  10/25/23 1300  BP: (!) 102/52  Pulse: (!) 49  Resp: 16  Temp: (!) 96.8 F (36 C)      Body mass index is 15.84 kg/m.      General: Well-developed, well-nourished, no acute distress. Eyes: Pink conjunctiva, anicteric sclera. HEENT: Normocephalic, moist mucous membranes, clear oropharnyx. Lungs: Clear to auscultation bilaterally. Heart: Regular rate and rhythm. No rubs, murmurs, or gallops. Abdomen: Soft, nontender, nondistended. No organomegaly noted, normoactive bowel sounds. Musculoskeletal: No edema, cyanosis, or clubbing. Neuro: Alert, answering all questions appropriately. Cranial nerves grossly intact. Skin: No rashes or petechiae noted. Psych: Normal affect. Lymphatics: No cervical, calvicular, axillary or inguinal LAD.   LAB RESULTS:  Lab Results  Component Value Date   NA 140 09/17/2023   K 4.1 09/17/2023   CL 109 09/17/2023   CO2 24 09/17/2023   GLUCOSE 110 (H) 09/17/2023   BUN 15 09/17/2023   CREATININE 0.70 09/17/2023   CALCIUM 8.4 (L) 09/17/2023   PROT 5.3 (L) 09/17/2023   ALBUMIN 3.1 (L) 09/17/2023   AST 15 09/17/2023   ALT 9 09/17/2023   ALKPHOS 37 (L) 09/17/2023   BILITOT 0.4 09/17/2023   GFRNONAA >60 09/17/2023   GFRAA >60 05/18/2018    Lab Results  Component Value Date   WBC 9.4 10/25/2023   NEUTROABS 8.5 (H) 10/25/2023   HGB 7.5 (L) 10/25/2023   HCT 25.9 (L) 10/25/2023   MCV 109.3 (H) 10/25/2023   PLT 396 10/25/2023    Lab Results  Component Value Date   TIBC 322 09/23/2023   TIBC 363 09/15/2023   TIBC <676 (HH) 08/15/2023   FERRITIN 91 09/23/2023   FERRITIN 41 09/15/2023   FERRITIN 180 (H) 08/15/2023   IRONPCTSAT 14 (L) 09/23/2023   IRONPCTSAT 8 (L) 09/15/2023   IRONPCTSAT >97 (HH) 08/15/2023     STUDIES: No results found.   ASSESSMENT AND PLAN:   ELLIZABETH DACRUZ is a 78 y.o. female with pmh of A-fib, arthritis,  asthma, AVM, Barrett's esophagus, carotid stenosis, COPD, diabetes, GERD, IBS, PVD referred to hematology for management of iron deficiency anemia.  # Iron deficiency anemia -Patient has longstanding history of iron deficiency anemia.  Outpatient she has been treated with multiple doses of IV Venofer.  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  -Hemoglobin today is 7.5, new onset macrocytosis with MCV 109.3.  Further labs were performed.  Vitamin B12/folate normal.  SPEP/IFE no M protein.  Kappa 24, lambda 15.3 with ratio of 1.58 which is normal.  LDH and haptoglobin is normal.  Reticulocyte count is elevated at 7.1% which may be contributing to macrocytosis. Ferritin 131 however iron saturation is still low at 8%.  I spoke with the patient over the phone to discuss the lab results.  Initially the plan was to proceed with bone marrow biopsy however with reticulocytosis it could be a sign of bone marrow recovering from recent iron infusions.  Will proceed with 4 more doses of IV Venofer 200 mg weekly.  She did report palpitations with activity which has recently worsened.  Scheduled for lab check on 3/26 and if hemoglobin is less than 8 give her 1 unit of PRBC transfusion.  I will follow-up with her in 4 weeks to reassess response to iron infusions.  If her hemoglobin is still low at that point we will proceed with bone marrow biopsy.  -She has longstanding history of microscopic hematuria and follows with Dr. Apolinar Junes.  Had CT scans and urine cytology which was negative. .  # Nocturia -Follows with Dr. Apolinar Junes.  She was started on Mybertiq.    # Depression -Continue with Effexor   # PVD -Follows with Dr. Wyn Quaker.  Plan for the surgery soon for bilateral common femoral artery blockage.  # IBS-diarrhea -Follows with Dr. Servando Snare.  On Imodium.  Last colonoscopy November 2021 as above.  Orders Placed This Encounter   Procedures   IR BONE MARROW BIOPSY & ASPIRATION   Vitamin B12   Folate   Haptoglobin   Lactate dehydrogenase   Reticulocytes   Multiple Myeloma Panel (SPEP&IFE w/QIG)   Kappa/lambda light chains   CBC with Differential (Cancer Center Only)   CBC with Differential (Cancer Center Only)   Bilirubin,Direct/Indirect(Fractionated)   Hold Tube- Blood Bank   Hold Tube- Blood Bank   RTC to be decided after ER discharge.  Patient expressed understanding and was in agreement with this plan. She also understands that She can call clinic at any time with any questions, concerns, or complaints.   I spent a total of 30 minutes reviewing chart data, face-to-face evaluation with the patient, counseling and coordination of care as detailed above.  Michaelyn Barter, MD   10/25/2023 2:29 PM

## 2023-10-26 LAB — HAPTOGLOBIN: Haptoglobin: 134 mg/dL (ref 42–346)

## 2023-10-26 LAB — KAPPA/LAMBDA LIGHT CHAINS
Kappa free light chain: 24.2 mg/L — ABNORMAL HIGH (ref 3.3–19.4)
Kappa, lambda light chain ratio: 1.58 (ref 0.26–1.65)
Lambda free light chains: 15.3 mg/L (ref 5.7–26.3)

## 2023-10-27 LAB — MULTIPLE MYELOMA PANEL, SERUM
Albumin SerPl Elph-Mcnc: 3.4 g/dL (ref 2.9–4.4)
Albumin/Glob SerPl: 1.5 (ref 0.7–1.7)
Alpha 1: 0.3 g/dL (ref 0.0–0.4)
Alpha2 Glob SerPl Elph-Mcnc: 0.6 g/dL (ref 0.4–1.0)
B-Globulin SerPl Elph-Mcnc: 0.8 g/dL (ref 0.7–1.3)
Gamma Glob SerPl Elph-Mcnc: 0.5 g/dL (ref 0.4–1.8)
Globulin, Total: 2.3 g/dL (ref 2.2–3.9)
IgA: 127 mg/dL (ref 64–422)
IgG (Immunoglobin G), Serum: 573 mg/dL — ABNORMAL LOW (ref 586–1602)
IgM (Immunoglobulin M), Srm: 53 mg/dL (ref 26–217)
Total Protein ELP: 5.7 g/dL — ABNORMAL LOW (ref 6.0–8.5)

## 2023-10-31 ENCOUNTER — Telehealth: Payer: Self-pay | Admitting: *Deleted

## 2023-10-31 NOTE — Telephone Encounter (Signed)
 The patient forgot to tell her that if lttle thing she does like, shower, make bed and she gets heart racing, she gets shaky. She forgot to saythis because she was worried about getting BM bx. She would like to get a call back

## 2023-11-01 ENCOUNTER — Encounter: Payer: Self-pay | Admitting: Internal Medicine

## 2023-11-01 NOTE — Telephone Encounter (Signed)
 Unable to reschedule bone marrow at this time as the IR MD schedule for April is not yet available. Per Marylu Lund the bone marrow was scheduled as a CT BMB in Ct dept (under the care of Dr. Fredia Sorrow). However, if bone marrow bx is scheduled in the IR lab dept- the order will need to be changed to IR BMB and Dr. Mervyn Skeeters will need to sign a new order. This is TBD based on IR schedule next month. Dr. Mervyn Skeeters is aware that bone marrow can not be r/s at this time. I will message Marylu Lund back next month as it gets closer to pt's apt on 4/21 to see when bone marrow biopsy can be r/s.

## 2023-11-01 NOTE — Telephone Encounter (Signed)
 Msg sent to scheduling to add additional apts  Weekly iv venofer x 4 - starting tomorrow (3/26) schedule if possible when pt comes for lab.  Return to clinic for lab/md on 4/21.   Msg sent to janet in scheduling to post pone bone marrow biopsy until after 4/21

## 2023-11-02 ENCOUNTER — Inpatient Hospital Stay

## 2023-11-02 ENCOUNTER — Other Ambulatory Visit: Payer: Self-pay

## 2023-11-02 ENCOUNTER — Telehealth: Payer: Self-pay | Admitting: *Deleted

## 2023-11-02 ENCOUNTER — Other Ambulatory Visit: Payer: Self-pay | Admitting: *Deleted

## 2023-11-02 VITALS — BP 102/52 | HR 62 | Temp 97.5°F | Resp 18

## 2023-11-02 DIAGNOSIS — D500 Iron deficiency anemia secondary to blood loss (chronic): Secondary | ICD-10-CM

## 2023-11-02 DIAGNOSIS — D649 Anemia, unspecified: Secondary | ICD-10-CM

## 2023-11-02 DIAGNOSIS — D539 Nutritional anemia, unspecified: Secondary | ICD-10-CM

## 2023-11-02 DIAGNOSIS — D509 Iron deficiency anemia, unspecified: Secondary | ICD-10-CM

## 2023-11-02 LAB — BILIRUBIN, TOTAL AND DIRECT (CANCER CENTER ONLY)
Bilirubin, Direct: <0.1 mg/dL (ref 0.0–0.2)
Total Bilirubin: 0.5 mg/dL (ref 0.0–1.2)

## 2023-11-02 LAB — CBC WITH DIFFERENTIAL (CANCER CENTER ONLY)
Abs Immature Granulocytes: 0.02 10*3/uL (ref 0.00–0.07)
Basophils Absolute: 0.1 10*3/uL (ref 0.0–0.1)
Basophils Relative: 1 %
Eosinophils Absolute: 0.2 10*3/uL (ref 0.0–0.5)
Eosinophils Relative: 3 %
HCT: 25 % — ABNORMAL LOW (ref 36.0–46.0)
Hemoglobin: 7.3 g/dL — ABNORMAL LOW (ref 12.0–15.0)
Immature Granulocytes: 0 %
Lymphocytes Relative: 7 %
Lymphs Abs: 0.5 10*3/uL — ABNORMAL LOW (ref 0.7–4.0)
MCH: 30.8 pg (ref 26.0–34.0)
MCHC: 29.2 g/dL — ABNORMAL LOW (ref 30.0–36.0)
MCV: 105.5 fL — ABNORMAL HIGH (ref 80.0–100.0)
Monocytes Absolute: 0.7 10*3/uL (ref 0.1–1.0)
Monocytes Relative: 10 %
Neutro Abs: 5.6 10*3/uL (ref 1.7–7.7)
Neutrophils Relative %: 79 %
Platelet Count: 433 10*3/uL — ABNORMAL HIGH (ref 150–400)
RBC: 2.37 MIL/uL — ABNORMAL LOW (ref 3.87–5.11)
RDW: 19.2 % — ABNORMAL HIGH (ref 11.5–15.5)
WBC Count: 7 10*3/uL (ref 4.0–10.5)
nRBC: 0.3 % — ABNORMAL HIGH (ref 0.0–0.2)

## 2023-11-02 LAB — PREPARE RBC (CROSSMATCH)

## 2023-11-02 MED ORDER — IRON SUCROSE 20 MG/ML IV SOLN
200.0000 mg | Freq: Once | INTRAVENOUS | Status: AC
  Administered 2023-11-02: 200 mg via INTRAVENOUS
  Filled 2023-11-02: qty 10

## 2023-11-02 NOTE — Telephone Encounter (Signed)
 Per Kaylan in blood bank- reported a positive antibody. Pt to receive a unit of blood tomorrow.

## 2023-11-03 ENCOUNTER — Inpatient Hospital Stay

## 2023-11-03 DIAGNOSIS — D649 Anemia, unspecified: Secondary | ICD-10-CM

## 2023-11-03 DIAGNOSIS — D539 Nutritional anemia, unspecified: Secondary | ICD-10-CM

## 2023-11-03 DIAGNOSIS — D500 Iron deficiency anemia secondary to blood loss (chronic): Secondary | ICD-10-CM

## 2023-11-03 DIAGNOSIS — D509 Iron deficiency anemia, unspecified: Secondary | ICD-10-CM | POA: Diagnosis not present

## 2023-11-03 MED ORDER — SODIUM CHLORIDE 0.9% FLUSH
10.0000 mL | INTRAVENOUS | Status: DC | PRN
  Filled 2023-11-03: qty 10

## 2023-11-03 MED ORDER — SODIUM CHLORIDE 0.9% IV SOLUTION
250.0000 mL | INTRAVENOUS | Status: DC
  Administered 2023-11-03: 50 mL via INTRAVENOUS
  Filled 2023-11-03: qty 250

## 2023-11-03 MED ORDER — DIPHENHYDRAMINE HCL 25 MG PO CAPS
25.0000 mg | ORAL_CAPSULE | Freq: Once | ORAL | Status: AC
  Administered 2023-11-03: 25 mg via ORAL
  Filled 2023-11-03: qty 1

## 2023-11-03 MED ORDER — ACETAMINOPHEN 325 MG PO TABS
650.0000 mg | ORAL_TABLET | Freq: Once | ORAL | Status: AC
  Administered 2023-11-03: 650 mg via ORAL
  Filled 2023-11-03: qty 2

## 2023-11-03 NOTE — Patient Instructions (Signed)

## 2023-11-04 LAB — BPAM RBC
Blood Product Expiration Date: 202504262359
ISSUE DATE / TIME: 202503270917
Unit Type and Rh: 202504262359
Unit Type and Rh: 6200

## 2023-11-04 LAB — TYPE AND SCREEN
ABO/RH(D): A POS
Antibody Screen: POSITIVE
Unit division: 0

## 2023-11-07 ENCOUNTER — Ambulatory Visit

## 2023-11-09 ENCOUNTER — Inpatient Hospital Stay: Attending: Internal Medicine

## 2023-11-09 VITALS — BP 101/62 | HR 64 | Temp 97.2°F | Resp 18

## 2023-11-09 DIAGNOSIS — R351 Nocturia: Secondary | ICD-10-CM | POA: Diagnosis not present

## 2023-11-09 DIAGNOSIS — K580 Irritable bowel syndrome with diarrhea: Secondary | ICD-10-CM | POA: Insufficient documentation

## 2023-11-09 DIAGNOSIS — D7589 Other specified diseases of blood and blood-forming organs: Secondary | ICD-10-CM | POA: Diagnosis not present

## 2023-11-09 DIAGNOSIS — Z7902 Long term (current) use of antithrombotics/antiplatelets: Secondary | ICD-10-CM | POA: Insufficient documentation

## 2023-11-09 DIAGNOSIS — I739 Peripheral vascular disease, unspecified: Secondary | ICD-10-CM | POA: Insufficient documentation

## 2023-11-09 DIAGNOSIS — D509 Iron deficiency anemia, unspecified: Secondary | ICD-10-CM | POA: Insufficient documentation

## 2023-11-09 DIAGNOSIS — F32A Depression, unspecified: Secondary | ICD-10-CM | POA: Insufficient documentation

## 2023-11-09 DIAGNOSIS — D500 Iron deficiency anemia secondary to blood loss (chronic): Secondary | ICD-10-CM

## 2023-11-09 DIAGNOSIS — R3129 Other microscopic hematuria: Secondary | ICD-10-CM | POA: Diagnosis not present

## 2023-11-09 DIAGNOSIS — Z7982 Long term (current) use of aspirin: Secondary | ICD-10-CM | POA: Diagnosis not present

## 2023-11-09 MED ORDER — IRON SUCROSE 20 MG/ML IV SOLN
200.0000 mg | Freq: Once | INTRAVENOUS | Status: AC
  Administered 2023-11-09: 200 mg via INTRAVENOUS

## 2023-11-09 NOTE — Patient Instructions (Signed)

## 2023-11-11 ENCOUNTER — Encounter: Payer: Self-pay | Admitting: Internal Medicine

## 2023-11-11 ENCOUNTER — Ambulatory Visit (INDEPENDENT_AMBULATORY_CARE_PROVIDER_SITE_OTHER): Payer: PPO

## 2023-11-11 ENCOUNTER — Encounter (INDEPENDENT_AMBULATORY_CARE_PROVIDER_SITE_OTHER): Payer: Self-pay | Admitting: Vascular Surgery

## 2023-11-11 ENCOUNTER — Ambulatory Visit (INDEPENDENT_AMBULATORY_CARE_PROVIDER_SITE_OTHER): Payer: PPO | Admitting: Vascular Surgery

## 2023-11-11 VITALS — BP 101/62 | HR 68 | Resp 16 | Wt 94.4 lb

## 2023-11-11 DIAGNOSIS — E785 Hyperlipidemia, unspecified: Secondary | ICD-10-CM

## 2023-11-11 DIAGNOSIS — I70219 Atherosclerosis of native arteries of extremities with intermittent claudication, unspecified extremity: Secondary | ICD-10-CM | POA: Diagnosis not present

## 2023-11-11 DIAGNOSIS — I10 Essential (primary) hypertension: Secondary | ICD-10-CM

## 2023-11-11 DIAGNOSIS — E1151 Type 2 diabetes mellitus with diabetic peripheral angiopathy without gangrene: Secondary | ICD-10-CM

## 2023-11-11 NOTE — Assessment & Plan Note (Signed)
 blood pressure control important in reducing the progression of atherosclerotic disease. On appropriate oral medications.

## 2023-11-11 NOTE — Assessment & Plan Note (Signed)
 Symptoms are markedly improved after extensive revascularization including bilateral femoral endarterectomies and the Endologix stent graft for her aortoiliac disease.  ABIs are 1.18 on the right and 1.17 on the left with triphasic waveforms.  Would continue dual antiplatelet therapy and statin agent at least until she follows up in about 3 months and then we can discuss removing one of the antiplatelet agents.

## 2023-11-11 NOTE — Progress Notes (Signed)
 MRN : [id number]  Yolanda Jimenez is a 78 y.o. (02-09-1946) female who presents with chief complaint of  Chief Complaint  Patient presents with   Follow-up    2-3 month abi  .  History of Present Illness: Patient returns today in follow up of her PAD.  She is almost 3 months status post bilateral femoral endarterectomies as well as Endologix stent graft and Lifestream stent graft to the iliac arteries for revascularization.  She is doing fairly well at this point.  She does still have some easy bruising on the dual antiplatelet therapy.  She has no open wounds or infection.  She still has some numbness on the inner part of her thighs particularly on the right side. Symptoms are markedly improved after extensive revascularization including bilateral femoral endarterectomies and the Endologix stent graft for her aortoiliac disease.  ABIs are 1.18 on the right and 1.17 on the left with triphasic waveforms.   Current Outpatient Medications  Medication Sig Dispense Refill   acetaminophen (TYLENOL) 500 MG tablet Take 1,000 mg by mouth every 6 (six) hours as needed.     aspirin EC 81 MG tablet Take 1 tablet (81 mg total) by mouth daily. Swallow whole. 150 tablet 2   buPROPion (WELLBUTRIN) 75 MG tablet Take 75 mg by mouth every morning.     Cholecalciferol 125 MCG (5000 UT) TABS Take 1 tablet by mouth daily.     clopidogrel (PLAVIX) 75 MG tablet Take 1 tablet (75 mg total) by mouth daily. 30 tablet 6   cyanocobalamin 1000 MCG tablet Take 1,000 mcg by mouth daily.     denosumab (PROLIA) 60 MG/ML SOSY injection Inject 60 mg into the skin every 6 (six) months.     donepezil (ARICEPT) 5 MG tablet Take 1 tablet by mouth at bedtime.     Ferrous Gluconate 239 (27 Fe) MG TABS Take 1 tablet by mouth daily.     fluticasone (FLONASE) 50 MCG/ACT nasal spray Place 1 spray into both nostrils daily as needed for allergies or rhinitis.     loperamide (IMODIUM) 2 MG capsule Take 2 mg by mouth every morning.      metoprolol succinate (TOPROL-XL) 25 MG 24 hr tablet Take 1 tablet by mouth daily.     pantoprazole (PROTONIX) 40 MG tablet Take 40 mg by mouth daily.     rosuvastatin (CRESTOR) 20 MG tablet Take 20 mg by mouth daily.     No current facility-administered medications for this visit.    Past Medical History:  Diagnosis Date   [facility])    a.) CHA2DS2-VASc = 5 (age x2, sex, vascular disease history, T2DM) as of 08/23/2023; b.) cardiac rate/rhythm maintained on oral metoprolol succinate; no chronic OAC   Abnormal vaginal Pap smear 2015   Anxiety    a.) on BZO PRN (diazepam)   Aortic atherosclerosis (HCC)    Aortic stenosis 05/03/2023   a.) TTE 05/03/2023: mild AS (MPG 12 mmHg; AVA = 1.3 cm2)   Arthritis of spine    Asthma    Barrett esophagus    Benign hematuria 08/29/2014   W/u neg  Normal renal ultrasound 2/19  Cystoscopy normal 2019, [place]  CT -9/24     Bilateral carotid artery disease (HCC)    a.) carotid dopplers 10/13/2016, 10/24/2018, 10/26/2019, 10/21/2020, 10/22/2022: 1-39% BICA   C. difficile diarrhea    CAD (coronary artery disease)    a.) CT chest 07/12/2018: LAD calcifications; b.) CT CAP 11/18/2022: 3 vessel CAD;  c.) cPET 05/19/2023: severe 4 vessel coronary calcs mainly in LAD distribution; d.) cPET 05/03/2023: small/mild reversible defect in mid-anterosep/ant region   Complication of anesthesia    a.) delayed emergence; b.) emergence delirium; "I get really loopy after surgery"   COPD (chronic obstructive pulmonary disease) (HCC)    DDD (degenerative disc disease), lumbosacral    Depression    Diverticulosis    Epigastric hernia    GERD (gastroesophageal reflux disease)    Glaucoma    Heart murmur    Heart palpitations    History of bilateral cataract extraction 2015   History of recurrent UTIs    Hx of adenomatous colonic polyps    Hyperlipidemia    IBS (irritable bowel syndrome)    Insomnia    Iron deficiency anemia    Kidney cysts    Long term current  use of aspirin    Macular degeneration of left eye    Osteoporosis    a.) on RANKLi (denosumab)   PVD (peripheral vascular disease) with claudication (HCC)    Small bowel arteriovenous malformation 2007   a.) noted on VCE in 2007   Type 2 diabetes, diet controlled (HCC)    Wears dentures    partial lower    Past Surgical History:  Procedure Laterality Date   ABDOMINAL AORTIC ENDOVASCULAR STENT GRAFT N/A 08/24/2023   Procedure: ABDOMINAL AORTIC ENDOVASCULAR STENT GRAFT;  Surgeon: Annice Needy, MD;  Location: [facility];  Service: Vascular;  Laterality: N/A;   BUNIONECTOMY Right    CATARACT EXTRACTION Bilateral    CERVICAL CONE BIOPSY     CHOLECYSTECTOMY     COLONOSCOPY     COLONOSCOPY WITH PROPOFOL N/A 01/22/2019   Procedure: COLONOSCOPY WITH BIOPSY;  Surgeon: Midge Minium, MD;  Location: [facility];  Service: Endoscopy;  Laterality: N/A;  diabetic - diet controlled   COLONOSCOPY WITH PROPOFOL N/A 06/27/2020   Procedure: COLONOSCOPY WITH BIOPSY;  Surgeon: Midge Minium, MD;  Location: [facility];  Service: Endoscopy;  Laterality: N/A;  priority 4   ENDARTERECTOMY FEMORAL Bilateral 08/24/2023   Procedure: ENDARTERECTOMY FEMORAL;  Surgeon: Annice Needy, MD;  Location: [facility];  Service: Vascular;  Laterality: Bilateral;   ESOPHAGOGASTRODUODENOSCOPY (EGD) WITH PROPOFOL N/A 08/29/2017   Procedure: ESOPHAGOGASTRODUODENOSCOPY (EGD) WITH PROPOFOL;  Surgeon: Midge Minium, MD;  Location: [facility];  Service: Endoscopy;  Laterality: N/A;   ESOPHAGOGASTRODUODENOSCOPY (EGD) WITH PROPOFOL N/A 09/16/2023   Procedure: ESOPHAGOGASTRODUODENOSCOPY (EGD) WITH PROPOFOL;  Surgeon: Midge Minium, MD;  Location: [facility];  Service: Endoscopy;  Laterality: N/A;   FOOT SURGERY     right 2nd toe   HIP ARTHROPLASTY Left 05/16/2018   Procedure: ARTHROPLASTY BIPOLAR HIP (HEMIARTHROPLASTY);  Surgeon: Christena Flake, MD;  Location: [facility];  Service: Orthopedics;  Laterality: Left;    HOT HEMOSTASIS  09/16/2023   Procedure: HOT HEMOSTASIS (ARGON PLASMA COAGULATION/BICAP);  Surgeon: Midge Minium, MD;  Location: [facility];  Service: Endoscopy;;   INSERTION OF ILIAC STENT Bilateral 08/24/2023   Procedure: INSERTION OF ILIAC STENT;  Surgeon: Annice Needy, MD;  Location: [facility];  Service: Vascular;  Laterality: Bilateral;   LOWER EXTREMITY ANGIOGRAPHY Left 04/14/2023   Procedure: Lower Extremity Angiography;  Surgeon: Annice Needy, MD;  Location: [facility];  Service: Cardiovascular;  Laterality: Left;   ORIF PERIPROSTHETIC FRACTURE Left 12/10/2021   Procedure: OPEN REDUCTION INTERNAL FIXATION OF A LEFT GREATER TROCHANTERIC FRACTURE;  Surgeon: Christena Flake, MD;  Location: [facility];  Service: Orthopedics;  Laterality: Left;   ORIF WRIST FRACTURE Right 10/07/2020   Procedure: OPEN REDUCTION INTERNAL FIXATION (ORIF) WRIST FRACTURE;  Surgeon: Kennedy Bucker, MD;  Location: [facility];  Service: Orthopedics;  Laterality: Right;   POLYPECTOMY N/A 01/22/2019   Procedure: POLYPECTOMY;  Surgeon: Midge Minium, MD;  Location: [facility];  Service: Endoscopy;  Laterality: N/A;  2 Clips placed at polyp removal site in ascending colon   POLYPECTOMY N/A 06/27/2020   Procedure: POLYPECTOMY;  Surgeon: Midge Minium, MD;  Location: [facility];  Service: Endoscopy;  Laterality: N/A;   TUBAL LIGATION     UPPER GASTROINTESTINAL ENDOSCOPY       Social History   Tobacco Use   Smoking status: Every Day    Current packs/day: 0.50    Average packs/day: 0.5 packs/day for 55.0 years (27.5 ttl pk-yrs)    Types: Cigarettes   Smokeless tobacco: Never   Tobacco comments:    she has patches to start just not started yet. smoked since teenager.  Vaping Use   Vaping status: Never Used  Substance Use Topics   Alcohol use: Yes    Alcohol/week: 2.0 standard drinks of alcohol    Types: 2 Glasses of wine per week    Comment: occ wine 1-2 weekly   Drug use: No     Family  History  Problem Relation Age of Onset   Diabetes Mother    Heart disease Mother    Hypertension Mother    Diabetes Son    Diabetes Sister    Inflammatory bowel disease Father        diverticulitis   Multiple myeloma Father    Hypertension Son    Diabetes Maternal Grandmother    Colon cancer Neg Hx    Esophageal cancer Neg Hx    Rectal cancer Neg Hx    Stomach cancer Neg Hx    Breast cancer Neg Hx      Allergies  Allergen Reactions   Codeine     "felt drunk, crazy"   Hydrocodone Other (See Comments)   Hydrocodone-Acetaminophen Other (See Comments)    Confusion/delirium   Lidocaine     Heart racing, increased BP - from local at dentist (likely epi)   Nsaids Other (See Comments)    Avoids due to IBS symptoms (bleeding symptoms)   Oxycodone     Confusion/delirium     REVIEW OF SYSTEMS (Negative unless checked)  Constitutional: [] Weight loss  [] Fever  [] Chills Cardiac: [] Chest pain   [] Chest pressure   [] Palpitations   [] Shortness of breath when laying flat   [] Shortness of breath at rest   [] Shortness of breath with exertion. Vascular:  [] Pain in legs with walking   [] Pain in legs at rest   [] Pain in legs when laying flat   [] Claudication   [] Pain in feet when walking  [] Pain in feet at rest  [] Pain in feet when laying flat   [] History of DVT   [] Phlebitis   [] Swelling in legs   [] Varicose veins   [] Non-healing ulcers Pulmonary:   [] Uses home oxygen   [] Productive cough   [] Hemoptysis   [] Wheeze  [] COPD   [] Asthma Neurologic:  [] Dizziness  [] Blackouts   [] Seizures   [] History of stroke   [] History of TIA  [] Aphasia   [] Temporary blindness   [] Dysphagia   [] Weakness or numbness in arms   [x] Weakness or numbness in legs Musculoskeletal:  [] Arthritis   [] Joint swelling   [] Joint pain   [] Low back pain Hematologic:  [x] Easy  bruising  [] Easy bleeding   [] Hypercoagulable state   [] Anemic   Gastrointestinal:  [] Blood in stool   [] Vomiting blood  [] Gastroesophageal reflux/heartburn    [] Abdominal pain Genitourinary:  [] Chronic kidney disease   [] Difficult urination  [] Frequent urination  [] Burning with urination   [] Hematuria Skin:  [] Rashes   [] Ulcers   [] Wounds Psychological:  [] History of anxiety   []  History of major depression.  Physical Examination  BP 101/62   Pulse 68   Resp 16   Wt 94 lb 6.4 oz (42.8 kg)   BMI 15.71 kg/m  Gen:  WD/WN, NAD Head: [place]/AT, No temporalis wasting. Ear/Nose/Throat: Hearing grossly intact, nares w/o erythema or drainage Eyes: Conjunctiva clear. Sclera non-icteric Neck: Supple.  Trachea midline Pulmonary:  Good air movement, no use of accessory muscles.  Cardiac: RRR, no JVD Vascular:  Vessel Right Left  Radial Palpable Palpable                          PT Palpable Palpable  DP Palpable Palpable   Gastrointestinal: soft, non-tender/non-distended. No guarding/reflex.  Musculoskeletal: M/S 5/5 throughout.  No deformity or atrophy. Trace LE edema. Neurologic: Sensation grossly intact in extremities.  Symmetrical.  Speech is fluent.  Psychiatric: Judgment intact, Mood & affect appropriate for pt's clinical situation. Dermatologic: No rashes or ulcers noted.  No cellulitis or open wounds. Incisions are C/D/I      Labs Recent Results (from the past 2160 hours)  CBC with Differential/Platelet     Status: Abnormal   Collection Time: 08/15/23  1:41 PM  Result Value Ref Range   WBC 8.7 3.4 - 10.8 x10E3/uL   RBC 3.66 (L) 3.77 - 5.28 x10E6/uL   Hemoglobin 10.7 (L) 11.1 - 15.9 g/dL   Hematocrit 16.1 09.6 - 46.6 %   MCV 98 (H) 79 - 97 fL   MCH 29.2 26.6 - 33.0 pg   MCHC 29.9 (L) 31.5 - 35.7 g/dL   RDW 04.5 (H) 40.9 - 81.1 %   Platelets 513 (H) 150 - 450 x10E3/uL   Neutrophils 82 Not Estab. %   Lymphs 9 Not Estab. %   Monocytes 7 Not Estab. %   Eos 1 Not Estab. %   Basos 1 Not Estab. %   Neutrophils Absolute 7.2 (H) 1.4 - 7.0 x10E3/uL   Lymphocytes Absolute 0.8 0.7 - 3.1 x10E3/uL   Monocytes Absolute 0.6 0.1 - 0.9  x10E3/uL   EOS (ABSOLUTE) 0.1 0.0 - 0.4 x10E3/uL   Basophils Absolute 0.1 0.0 - 0.2 x10E3/uL   Immature Granulocytes 0 Not Estab. %   Immature Grans (Abs) 0.0 0.0 - 0.1 x10E3/uL  Iron, TIBC and Ferritin Panel     Status: Abnormal   Collection Time: 08/15/23  1:41 PM  Result Value Ref Range   Total Iron Binding Capacity <676 (HH) 250 - 450 ug/dL   UIBC <91 (L) 478 - 295 ug/dL   Iron 621 (HH) 27 - 308 ug/dL    Comment: **Verified by repeat analysis**   Iron Saturation >97 (HH) 15 - 55 %   Ferritin 180 (H) 15 - 150 ng/mL  Celiac Disease Ab Screen w/Rfx     Status: None   Collection Time: 08/15/23  1:41 PM  Result Value Ref Range   Antigliadin Abs, IgA 8 0 - 19 units    Comment:                    Negative  0 - 19                    Weak Positive             20 - 30                    Moderate to Strong Positive   >30    Transglutaminase IgA <2 0 - 3 U/mL    Comment:                               Negative        0 -  3                               Weak Positive   4 - 10                               Positive           >10  Tissue Transglutaminase (tTG) has been identified  as the endomysial antigen.  Studies have demonstr-  ated that endomysial IgA antibodies have over 99%  specificity for gluten sensitive enteropathy.    IgA/Immunoglobulin A, Serum 135 64 - 422 mg/dL  Type and screen     Status: None   Collection Time: 08/17/23 10:49 AM  Result Value Ref Range   ABO/RH(D) A POS    Antibody Screen NEG    Sample Expiration 08/31/2023,2359    Extend sample reason      NO TRANSFUSIONS OR PREGNANCY IN THE PAST 3 MONTHS Performed at [facility], [address] [place], [place] [address]   CBC with Differential/Platelet     Status: Abnormal   Collection Time: 08/17/23 10:50 AM  Result Value Ref Range   WBC 8.4 4.0 - 10.5 K/uL   RBC 3.42 (L) 3.87 - 5.11 MIL/uL   Hemoglobin 10.2 (L) 12.0 - 15.0 g/dL   HCT 60.4 (L) 54.0 - 98.1 %   MCV 96.5 80.0 -  100.0 fL   MCH 29.8 26.0 - 34.0 pg   MCHC 30.9 30.0 - 36.0 g/dL   RDW 19.1 (H) 47.8 - 29.5 %   Platelets 437 (H) 150 - 400 K/uL   nRBC 0.0 0.0 - 0.2 %   Neutrophils Relative % 86 %   Neutro Abs 7.3 1.7 - 7.7 K/uL   Lymphocytes Relative 5 %   Lymphs Abs 0.4 (L) 0.7 - 4.0 K/uL   Monocytes Relative 7 %   Monocytes Absolute 0.6 0.1 - 1.0 K/uL   Eosinophils Relative 1 %   Eosinophils Absolute 0.0 0.0 - 0.5 K/uL   Basophils Relative 1 %   Basophils Absolute 0.1 0.0 - 0.1 K/uL   WBC Morphology MORPHOLOGY UNREMARKABLE    RBC Morphology See Note     Comment: MIXED RBC MORPHOLOGY   Smear Review Normal platelet morphology    Immature Granulocytes 0 %   Abs Immature Granulocytes 0.03 0.00 - 0.07 K/uL   Polychromasia PRESENT    Target Cells PRESENT     Comment: Performed at [facility], [address] [place], [place] [address]  Basic metabolic panel     Status: Abnormal   Collection Time: 08/17/23 10:50 AM  Result Value Ref Range   Sodium 140 135 - 145  mmol/L   Potassium 4.5 3.5 - 5.1 mmol/L   Chloride 108 98 - 111 mmol/L   CO2 23 22 - 32 mmol/L   Glucose, Bld 117 (H) 70 - 99 mg/dL    Comment: Glucose reference range applies only to samples taken after fasting for at least 8 hours.   BUN 20 8 - 23 mg/dL   Creatinine, Ser 1.61 0.44 - 1.00 mg/dL   Calcium 8.7 (L) 8.9 - 10.3 mg/dL   GFR, Estimated >09 >60 mL/min    Comment: (NOTE) Calculated using the CKD-EPI Creatinine Equation (2021)    Anion gap 9 5 - 15    Comment: Performed at [facility], [address] [place], [place] [address]  GI Profile, Stool, PCR     Status: None   Collection Time: 08/22/23 12:05 PM  Result Value Ref Range   Campylobacter Not Detected Not Detected   C difficile toxin A/B Not Detected Not Detected   Plesiomonas shigelloides Not Detected Not Detected   Salmonella Not Detected Not Detected   Vibrio Not Detected Not Detected   Vibrio cholerae Not Detected Not Detected   Yersinia  enterocolitica Not Detected Not Detected   Enteroaggregative E coli Not Detected Not Detected   Enteropathogenic E coli Not Detected Not Detected   Enterotoxigenic E coli Not Detected Not Detected   Shiga-toxin-producing E coli Not Detected Not Detected   E coli O157 Not applicable Not Detected   Shigella/Enteroinvasive E coli Not Detected Not Detected   Cryptosporidium Not Detected Not Detected   Cyclospora cayetanensis Not Detected Not Detected   Entamoeba histolytica Not Detected Not Detected   Giardia lamblia Not Detected Not Detected   Adenovirus F 40/41 Not Detected Not Detected   Astrovirus Not Detected Not Detected   Norovirus GI/GII Not Detected Not Detected   Rotavirus A Not Detected Not Detected   Sapovirus Not Detected Not Detected  Calprotectin, Fecal     Status: None   Collection Time: 08/22/23 12:05 PM   Specimen: Stool  Result Value Ref Range   Calprotectin, Fecal 36 0 - 120 ug/g    Comment: Concentration     Interpretation   Follow-Up < 5 - 50 ug/g     Normal           None >50 -120 ug/g     Borderline       Re-evaluate in 4-6 weeks     >120 ug/g     Abnormal         Repeat as clinically                                    indicated   Pancreatic elastase, fecal     Status: None   Collection Time: 08/22/23 12:05 PM  Result Value Ref Range   Pancreatic Elastase, Fecal 513 >200 ug Elast./g    Comment:        Severe Pancreatic Insufficiency:          <100        Moderate Pancreatic Insufficiency:   100 - 200        Normal:                                   >200   Clostridium Difficile by PCR     Status: None   Collection  Time: 08/23/23  9:17 AM   Specimen: STOOL   ST  Result Value Ref Range   Toxigenic C. Difficile by PCR Negative Negative  Surgical pathology     Status: None   Collection Time: 08/24/23 12:00 AM  Result Value Ref Range   SURGICAL PATHOLOGY      SURGICAL PATHOLOGY [facility] [address] [place], [place]  [address] Telephone [phone] or [phone] Fax [phone]  REPORT OF SURGICAL PATHOLOGY   Accession #: [id number] Patient Name: NAARAH, BORGERDING Visit # : [id number]  MRN: [id number] Physician: Talbot Grumbling Aug 29, 1945 (Age: 74) Gender: F Collected Date: 08/24/2023 Received Date: 08/24/2023  FINAL DIAGNOSIS       1. Plaque, Left common superficial femoral artery profunda :       - FINDINGS CONSISTENT WITH CALCIFIC ATHEROSCLEROTIC PLAQUE(S)       2. Plaque, Right common superficial femoral artery profunda :       - FINDINGS CONSISTENT WITH CALCIFIC ATHEROSCLEROTIC PLAQUE(S)       ELECTRONIC SIGNATURE : Kanteti M.D., Dossie Arbour., Pathologist, Electronic Signature  MICROSCOPIC DESCRIPTION  CASE COMMENTS STAINS USED IN DIAGNOSIS: H&E H&E    CLINICAL HISTORY  SPECIMEN(S) OBTAINED 1. Plaque, Left Common Superficial Femoral Artery Profunda 2.  Plaque, Right Common Superficial Femoral Artery Profunda  SPECIMEN COMMENTS: SPECIMEN CLINICAL INFORMATION: 1. ASO with claudication    Gross Description 1. "Left common, superficial femoral", received fresh are three diffusely calcified, pink and yellow, 1.8-6.8 cm long, tubular fragments and an additional 1.5 x 1.4 x 0.2 cm aggregate of three mildly calcified fragments; the specimen is consistent with plaque-like material.Representative sections are submitted in 1 block (1A) and placed in decalcification solution. 2. "Right common, superficial femoral", received fresh is a 4.0 x 2.0 by a 1.0 cm aggregate of multiple yellow and pink, predominantly calcified  Fragments consistent with plaque material.Representative sections are submitted in 1 block (2A) and placed in decalcification solution.      [facility] 08/24/2023        Report signed out from the following location(s) [facility] [address], [place] [address] CLIA #: [id number]   [facility] [address] [place], [place] [address] CLIA #: [id number]   Glucose, capillary     Status: Abnormal   Collection Time: 08/24/23  1:05 PM  Result Value Ref Range   Glucose-Capillary 175 (H) 70 - 99 mg/dL    Comment: Glucose reference range applies only to samples taken after fasting for at least 8 hours.  Glucose, capillary     Status: Abnormal   Collection Time: 08/24/23  2:36 PM  Result Value Ref Range   Glucose-Capillary 152 (H) 70 - 99 mg/dL    Comment: Glucose reference range applies only to samples taken after fasting for at least 8 hours.  MRSA Next Gen by PCR, Nasal     Status: None   Collection Time: 08/24/23  2:47 PM   Specimen: Nasal Mucosa; Nasal Swab  Result Value Ref Range   MRSA by PCR Next Gen NOT DETECTED NOT DETECTED    Comment: (NOTE) The GeneXpert MRSA Assay (FDA approved for NASAL specimens only), is one component of a comprehensive MRSA colonization surveillance program. It is not intended to diagnose MRSA infection nor to guide or monitor treatment for MRSA infections. Test performance is not FDA approved in patients less than 17 years old. Performed at [facility], [address] [place], [place] [address]   CBC     Status: Abnormal   Collection Time: 08/24/23  3:14 PM  Result Value Ref Range   WBC 12.5 (H) 4.0 - 10.5 K/uL   RBC 2.72 (L) 3.87 - 5.11 MIL/uL   Hemoglobin 8.2 (L) 12.0 - 15.0 g/dL   HCT 91.4 (L) 78.2 - 95.6 %   MCV 97.1 80.0 - 100.0 fL   MCH 30.1 26.0 - 34.0 pg   MCHC 31.1 30.0 - 36.0 g/dL   RDW 21.3 (H) 08.6 - 57.8 %   Platelets 261 150 - 400 K/uL   nRBC 0.0 0.0 - 0.2 %    Comment: Performed at [facility], [address] [place], [place] [address]  Creatinine, serum     Status: None   Collection Time: 08/24/23  3:14 PM  Result Value Ref Range   Creatinine, Ser 0.67 0.44 - 1.00 mg/dL   GFR, Estimated >95 >28 mL/min    Comment: (NOTE) Calculated using the CKD-EPI Creatinine Equation (2021) Performed at [facility], [address] [place], [place] [address]   Glucose, capillary     Status: Abnormal   Collection Time: 08/24/23  9:18 PM  Result Value Ref Range   Glucose-Capillary 112 (H) 70 - 99 mg/dL    Comment: Glucose reference range applies only to samples taken after fasting for at least 8 hours.  CBC     Status: Abnormal   Collection Time: 08/25/23 12:21 AM  Result Value Ref Range   WBC 11.5 (H) 4.0 - 10.5 K/uL   RBC 2.51 (L) 3.87 - 5.11 MIL/uL   Hemoglobin 7.7 (L) 12.0 - 15.0 g/dL   HCT 40.1 (L) 02.7 - 25.3 %   MCV 100.8 (H) 80.0 - 100.0 fL   MCH 30.7 26.0 - 34.0 pg   MCHC 30.4 30.0 - 36.0 g/dL   RDW 66.4 (H) 40.3 - 47.4 %   Platelets 248 150 - 400 K/uL   nRBC 0.0 0.0 - 0.2 %    Comment: Performed at [facility], [address], [place], [place] [address]  Basic metabolic panel     Status: Abnormal   Collection Time: 08/25/23 12:21 AM  Result Value Ref Range   Sodium 133 (L) 135 - 145 mmol/L   Potassium 3.8 3.5 - 5.1 mmol/L   Chloride 105 98 - 111 mmol/L   CO2 20 (L) 22 - 32 mmol/L   Glucose, Bld 117 (H) 70 - 99 mg/dL    Comment: Glucose reference range applies only to samples taken after fasting for at least 8 hours.   BUN 11 8 - 23 mg/dL   Creatinine, Ser 3.87 0.44 - 1.00 mg/dL   Calcium 7.4 (L) 8.9 - 10.3 mg/dL   GFR, Estimated >56 >43 mL/min    Comment: (NOTE) Calculated using the CKD-EPI Creatinine Equation (2021)    Anion gap 8 5 - 15    Comment: Performed at [facility], [address] [place], [place] [address]  Glucose, capillary     Status: Abnormal   Collection Time: 08/25/23  8:06 AM  Result Value Ref Range   Glucose-Capillary 101 (H) 70 - 99 mg/dL    Comment: Glucose reference range applies only to samples taken after fasting for at least 8 hours.  Glucose, capillary     Status: None   Collection Time: 08/25/23 11:44 AM  Result Value Ref Range   Glucose-Capillary 80 70 - 99 mg/dL  Comment: Glucose reference range applies  only to samples taken after fasting for at least 8 hours.  Hemoglobin and hematocrit, blood     Status: Abnormal   Collection Time: 08/25/23 12:33 PM  Result Value Ref Range   Hemoglobin 7.9 (L) 12.0 - 15.0 g/dL   HCT 16.1 (L) 09.6 - 04.5 %    Comment: Performed at [facility], [address] [place], [place] [address]  Urinalysis, Routine w reflex microscopic -Urine, Clean Catch     Status: Abnormal   Collection Time: 08/25/23  3:21 PM  Result Value Ref Range   Color, Urine STRAW (A) YELLOW   APPearance CLEAR (A) CLEAR   Specific Gravity, Urine 1.005 1.005 - 1.030   pH 5.0 5.0 - 8.0   Glucose, UA NEGATIVE NEGATIVE mg/dL   Hgb urine dipstick NEGATIVE NEGATIVE   Bilirubin Urine NEGATIVE NEGATIVE   Ketones, ur NEGATIVE NEGATIVE mg/dL   Protein, ur NEGATIVE NEGATIVE mg/dL   Nitrite NEGATIVE NEGATIVE   Leukocytes,Ua TRACE (A) NEGATIVE   RBC / HPF 0-5 0 - 5 RBC/hpf   WBC, UA 0-5 0 - 5 WBC/hpf   Bacteria, UA NONE SEEN NONE SEEN   Squamous Epithelial / HPF 0-5 0 - 5 /HPF    Comment: Performed at [facility], [address] [place], [place] [address]  Glucose, capillary     Status: None   Collection Time: 08/25/23  4:23 PM  Result Value Ref Range   Glucose-Capillary 70 70 - 99 mg/dL    Comment: Glucose reference range applies only to samples taken after fasting for at least 8 hours.  Glucose, capillary     Status: Abnormal   Collection Time: 08/25/23 10:10 PM  Result Value Ref Range   Glucose-Capillary 141 (H) 70 - 99 mg/dL    Comment: Glucose reference range applies only to samples taken after fasting for at least 8 hours.  Glucose, capillary     Status: Abnormal   Collection Time: 08/26/23  7:45 AM  Result Value Ref Range   Glucose-Capillary 104 (H) 70 - 99 mg/dL    Comment: Glucose reference range applies only to samples taken after fasting for at least 8 hours.  Glucose, capillary     Status: Abnormal   Collection Time: 08/26/23 11:27 AM  Result Value  Ref Range   Glucose-Capillary 112 (H) 70 - 99 mg/dL    Comment: Glucose reference range applies only to samples taken after fasting for at least 8 hours.  Glucose, capillary     Status: Abnormal   Collection Time: 08/26/23  5:54 PM  Result Value Ref Range   Glucose-Capillary 103 (H) 70 - 99 mg/dL    Comment: Glucose reference range applies only to samples taken after fasting for at least 8 hours.  Glucose, capillary     Status: Abnormal   Collection Time: 08/26/23  8:44 PM  Result Value Ref Range   Glucose-Capillary 141 (H) 70 - 99 mg/dL    Comment: Glucose reference range applies only to samples taken after fasting for at least 8 hours.  Glucose, capillary     Status: None   Collection Time: 08/27/23  7:25 AM  Result Value Ref Range   Glucose-Capillary 86 70 - 99 mg/dL    Comment: Glucose reference range applies only to samples taken after fasting for at least 8 hours.  Glucose, capillary     Status: Abnormal   Collection Time: 08/27/23 11:00 AM  Result Value Ref Range  Glucose-Capillary 165 (H) 70 - 99 mg/dL    Comment: Glucose reference range applies only to samples taken after fasting for at least 8 hours.  Glucose, capillary     Status: None   Collection Time: 08/27/23  4:02 PM  Result Value Ref Range   Glucose-Capillary 75 70 - 99 mg/dL    Comment: Glucose reference range applies only to samples taken after fasting for at least 8 hours.  Glucose, capillary     Status: Abnormal   Collection Time: 08/27/23  9:54 PM  Result Value Ref Range   Glucose-Capillary 120 (H) 70 - 99 mg/dL    Comment: Glucose reference range applies only to samples taken after fasting for at least 8 hours.  Glucose, capillary     Status: Abnormal   Collection Time: 08/28/23  8:16 AM  Result Value Ref Range   Glucose-Capillary 128 (H) 70 - 99 mg/dL    Comment: Glucose reference range applies only to samples taken after fasting for at least 8 hours.  CBC     Status: Abnormal   Collection Time:  08/28/23 12:21 PM  Result Value Ref Range   WBC 8.2 4.0 - 10.5 K/uL   RBC 2.53 (L) 3.87 - 5.11 MIL/uL   Hemoglobin 7.8 (L) 12.0 - 15.0 g/dL   HCT 60.4 (L) 54.0 - 98.1 %   MCV 95.3 80.0 - 100.0 fL   MCH 30.8 26.0 - 34.0 pg   MCHC 32.4 30.0 - 36.0 g/dL   RDW 19.1 (H) 47.8 - 29.5 %   Platelets 281 150 - 400 K/uL   nRBC 0.0 0.0 - 0.2 %    Comment: Performed at [facility], [address] [place], [place] [address]  Comprehensive metabolic panel     Status: Abnormal   Collection Time: 08/28/23 12:21 PM  Result Value Ref Range   Sodium 133 (L) 135 - 145 mmol/L   Potassium 3.4 (L) 3.5 - 5.1 mmol/L   Chloride 99 98 - 111 mmol/L   CO2 23 22 - 32 mmol/L   Glucose, Bld 141 (H) 70 - 99 mg/dL    Comment: Glucose reference range applies only to samples taken after fasting for at least 8 hours.   BUN 14 8 - 23 mg/dL   Creatinine, Ser 8.65 0.44 - 1.00 mg/dL   Calcium 8.3 (L) 8.9 - 10.3 mg/dL   Total Protein 5.4 (L) 6.5 - 8.1 g/dL   Albumin 3.0 (L) 3.5 - 5.0 g/dL   AST 20 15 - 41 U/L   ALT 10 0 - 44 U/L   Alkaline Phosphatase 49 38 - 126 U/L   Total Bilirubin 0.7 0.0 - 1.2 mg/dL   GFR, Estimated >78 >46 mL/min    Comment: (NOTE) Calculated using the CKD-EPI Creatinine Equation (2021)    Anion gap 11 5 - 15    Comment: Performed at [facility], [address] [place], [place] [address]  Glucose, capillary     Status: Abnormal   Collection Time: 08/28/23 12:31 PM  Result Value Ref Range   Glucose-Capillary 151 (H) 70 - 99 mg/dL    Comment: Glucose reference range applies only to samples taken after fasting for at least 8 hours.  Glucose, capillary     Status: Abnormal   Collection Time: 08/28/23  4:38 PM  Result Value Ref Range   Glucose-Capillary 120 (H) 70 - 99 mg/dL    Comment: Glucose reference range applies only to samples taken after fasting for at  least 8 hours.  Glucose, capillary     Status: Abnormal   Collection Time: 08/28/23  9:54 PM  Result Value Ref  Range   Glucose-Capillary 119 (H) 70 - 99 mg/dL    Comment: Glucose reference range applies only to samples taken after fasting for at least 8 hours.  Glucose, capillary     Status: None   Collection Time: 08/29/23  8:09 AM  Result Value Ref Range   Glucose-Capillary 99 70 - 99 mg/dL    Comment: Glucose reference range applies only to samples taken after fasting for at least 8 hours.   Comment 1 Notify RN    Comment 2 Document in Chart   Glucose, capillary     Status: Abnormal   Collection Time: 08/29/23 11:37 AM  Result Value Ref Range   Glucose-Capillary 119 (H) 70 - 99 mg/dL    Comment: Glucose reference range applies only to samples taken after fasting for at least 8 hours.   Comment 1 Notify RN    Comment 2 Document in Chart   Glucose, capillary     Status: Abnormal   Collection Time: 08/29/23  4:41 PM  Result Value Ref Range   Glucose-Capillary 121 (H) 70 - 99 mg/dL    Comment: Glucose reference range applies only to samples taken after fasting for at least 8 hours.   Comment 1 Notify RN    Comment 2 Document in Chart   Glucose, capillary     Status: None   Collection Time: 08/29/23  9:28 PM  Result Value Ref Range   Glucose-Capillary 86 70 - 99 mg/dL    Comment: Glucose reference range applies only to samples taken after fasting for at least 8 hours.  Glucose, capillary     Status: Abnormal   Collection Time: 08/30/23  2:15 PM  Result Value Ref Range   Glucose-Capillary 152 (H) 70 - 99 mg/dL    Comment: Glucose reference range applies only to samples taken after fasting for at least 8 hours.  Glucose, capillary     Status: Abnormal   Collection Time: 08/30/23  4:58 PM  Result Value Ref Range   Glucose-Capillary 66 (L) 70 - 99 mg/dL    Comment: Glucose reference range applies only to samples taken after fasting for at least 8 hours.   Comment 1 Notify RN   Glucose, capillary     Status: Abnormal   Collection Time: 08/30/23  9:18 PM  Result Value Ref Range    Glucose-Capillary 118 (H) 70 - 99 mg/dL    Comment: Glucose reference range applies only to samples taken after fasting for at least 8 hours.   Comment 1 Notify RN   Creatinine, serum     Status: None   Collection Time: 08/31/23  5:58 AM  Result Value Ref Range   Creatinine, Ser 0.66 0.44 - 1.00 mg/dL   GFR, Estimated >16 >10 mL/min    Comment: (NOTE) Calculated using the CKD-EPI Creatinine Equation (2021) Performed at [facility], [address] [place], [place] [address]   Glucose, capillary     Status: Abnormal   Collection Time: 08/31/23  8:13 AM  Result Value Ref Range   Glucose-Capillary 124 (H) 70 - 99 mg/dL    Comment: Glucose reference range applies only to samples taken after fasting for at least 8 hours.   Comment 1 Notify RN    Comment 2 Document in Chart   Glucose, capillary     Status: Abnormal  Collection Time: 08/31/23 11:31 AM  Result Value Ref Range   Glucose-Capillary 119 (H) 70 - 99 mg/dL    Comment: Glucose reference range applies only to samples taken after fasting for at least 8 hours.   Comment 1 Notify RN    Comment 2 Document in Chart   Glucose, capillary     Status: Abnormal   Collection Time: 08/31/23  4:29 PM  Result Value Ref Range   Glucose-Capillary 105 (H) 70 - 99 mg/dL    Comment: Glucose reference range applies only to samples taken after fasting for at least 8 hours.   Comment 1 Notify RN    Comment 2 Document in Chart   Glucose, capillary     Status: Abnormal   Collection Time: 08/31/23  8:44 PM  Result Value Ref Range   Glucose-Capillary 127 (H) 70 - 99 mg/dL    Comment: Glucose reference range applies only to samples taken after fasting for at least 8 hours.  Glucose, capillary     Status: Abnormal   Collection Time: 09/01/23  8:13 AM  Result Value Ref Range   Glucose-Capillary 132 (H) 70 - 99 mg/dL    Comment: Glucose reference range applies only to samples taken after fasting for at least 8 hours.  Glucose, capillary      Status: Abnormal   Collection Time: 09/01/23 11:27 AM  Result Value Ref Range   Glucose-Capillary 179 (H) 70 - 99 mg/dL    Comment: Glucose reference range applies only to samples taken after fasting for at least 8 hours.  Glucose, capillary     Status: Abnormal   Collection Time: 09/01/23  5:01 PM  Result Value Ref Range   Glucose-Capillary 150 (H) 70 - 99 mg/dL    Comment: Glucose reference range applies only to samples taken after fasting for at least 8 hours.  Glucose, capillary     Status: Abnormal   Collection Time: 09/01/23  9:18 PM  Result Value Ref Range   Glucose-Capillary 162 (H) 70 - 99 mg/dL    Comment: Glucose reference range applies only to samples taken after fasting for at least 8 hours.  Glucose, capillary     Status: Abnormal   Collection Time: 09/02/23  8:38 AM  Result Value Ref Range   Glucose-Capillary 167 (H) 70 - 99 mg/dL    Comment: Glucose reference range applies only to samples taken after fasting for at least 8 hours.   Comment 1 Notify RN    Comment 2 Document in Chart   Glucose, capillary     Status: Abnormal   Collection Time: 09/02/23 11:50 AM  Result Value Ref Range   Glucose-Capillary 192 (H) 70 - 99 mg/dL    Comment: Glucose reference range applies only to samples taken after fasting for at least 8 hours.  Glucose, capillary     Status: Abnormal   Collection Time: 09/02/23  4:31 PM  Result Value Ref Range   Glucose-Capillary 128 (H) 70 - 99 mg/dL    Comment: Glucose reference range applies only to samples taken after fasting for at least 8 hours.   Comment 1 Notify RN    Comment 2 Document in Chart   Glucose, capillary     Status: Abnormal   Collection Time: 09/02/23  9:16 PM  Result Value Ref Range   Glucose-Capillary 147 (H) 70 - 99 mg/dL    Comment: Glucose reference range applies only to samples taken after fasting for at least 8 hours.  Glucose, capillary  Status: Abnormal   Collection Time: 09/03/23  7:56 AM  Result Value Ref Range    Glucose-Capillary 154 (H) 70 - 99 mg/dL    Comment: Glucose reference range applies only to samples taken after fasting for at least 8 hours.  Glucose, capillary     Status: Abnormal   Collection Time: 09/03/23 11:40 AM  Result Value Ref Range   Glucose-Capillary 143 (H) 70 - 99 mg/dL    Comment: Glucose reference range applies only to samples taken after fasting for at least 8 hours.  VAS [place] ABI WITH/WO TBI     Status: None   Collection Time: 09/13/23 11:12 AM  Result Value Ref Range   Right ABI 1.14    Left ABI 1.11   Hold Tube- Blood Bank     Status: None   Collection Time: 09/15/23  1:17 PM  Result Value Ref Range   Blood Bank Specimen SAMPLE AVAILABLE FOR TESTING    Sample Expiration      09/18/2023,2359 Performed at [facility], [address] [place], [place] [address]   Iron and TIBC([facility])     Status: Abnormal   Collection Time: 09/15/23  1:17 PM  Result Value Ref Range   Iron 28 28 - 170 ug/dL   TIBC 366 440 - 347 ug/dL   Saturation Ratios 8 (L) 10.4 - 31.8 %   UIBC 335 ug/dL    Comment: Performed at [facility], [address] [place], [place] [address]  Ferritin     Status: None   Collection Time: 09/15/23  1:17 PM  Result Value Ref Range   Ferritin 41 11 - 307 ng/mL    Comment: Performed at [facility], [address] [place], [place] [address]  CBC with Differential (Cancer Center Only)     Status: Abnormal   Collection Time: 09/15/23  1:17 PM  Result Value Ref Range   WBC Count 8.0 4.0 - 10.5 K/uL   RBC 2.13 (L) 3.87 - 5.11 MIL/uL   Hemoglobin 6.1 (LL) 12.0 - 15.0 g/dL    Comment: REPEATED TO VERIFY THIS CRITICAL RESULT HAS VERIFIED AND BEEN CALLED TO DR AGRAWAL BY KIM ROOS ON 02 06 2025 AT 1351, AND HAS BEEN READ BACK.     HCT 20.7 (L) 36.0 - 46.0 %   MCV 97.2 80.0 - 100.0 fL   MCH 28.6 26.0 - 34.0 pg   MCHC 29.5 (L) 30.0 - 36.0 g/dL   RDW 64.3 (H) 32.9 - 51.8 %   Platelet Count 544 (H) 150 - 400 K/uL    nRBC 0.0 0.0 - 0.2 %   Neutrophils Relative % 81 %   Neutro Abs 6.5 1.7 - 7.7 K/uL   Lymphocytes Relative 8 %   Lymphs Abs 0.6 (L) 0.7 - 4.0 K/uL   Monocytes Relative 9 %   Monocytes Absolute 0.7 0.1 - 1.0 K/uL   Eosinophils Relative 1 %   Eosinophils Absolute 0.1 0.0 - 0.5 K/uL   Basophils Relative 0 %   Basophils Absolute 0.0 0.0 - 0.1 K/uL   Immature Granulocytes 1 %   Abs Immature Granulocytes 0.05 0.00 - 0.07 K/uL    Comment: Performed at [facility], [address] [place], [place] [address]  ABO/Rh     Status: None   Collection Time: 09/15/23  1:20 PM  Result Value Ref Range   ABO/RH(D)      A POS Performed at [facility], [address] [place],  [place] [address]   Basic metabolic panel     Status: Abnormal   Collection Time: 09/15/23  3:40 PM  Result Value Ref Range   Sodium 136 135 - 145 mmol/L   Potassium 4.1 3.5 - 5.1 mmol/L   Chloride 104 98 - 111 mmol/L   CO2 18 (L) 22 - 32 mmol/L   Glucose, Bld 138 (H) 70 - 99 mg/dL    Comment: Glucose reference range applies only to samples taken after fasting for at least 8 hours.   BUN 28 (H) 8 - 23 mg/dL   Creatinine, Ser 2.84 0.44 - 1.00 mg/dL   Calcium 8.7 (L) 8.9 - 10.3 mg/dL   GFR, Estimated >13 >24 mL/min    Comment: (NOTE) Calculated using the CKD-EPI Creatinine Equation (2021)    Anion gap 14 5 - 15    Comment: Performed at [facility], [address] [place], [place] [address]  CBC with Differential     Status: Abnormal   Collection Time: 09/15/23  3:40 PM  Result Value Ref Range   WBC 7.8 4.0 - 10.5 K/uL   RBC 2.15 (L) 3.87 - 5.11 MIL/uL   Hemoglobin 6.3 (L) 12.0 - 15.0 g/dL   HCT 72.5 (L) 36.6 - 44.0 %   MCV 100.5 (H) 80.0 - 100.0 fL   MCH 29.3 26.0 - 34.0 pg   MCHC 29.2 (L) 30.0 - 36.0 g/dL   RDW 34.7 (H) 42.5 - 95.6 %   Platelets 574 (H) 150 - 400 K/uL   nRBC 0.0 0.0 - 0.2 %   Neutrophils Relative % 78 %   Neutro Abs 6.0 1.7 - 7.7 K/uL   Lymphocytes Relative 10 %    Lymphs Abs 0.8 0.7 - 4.0 K/uL   Monocytes Relative 10 %   Monocytes Absolute 0.8 0.1 - 1.0 K/uL   Eosinophils Relative 1 %   Eosinophils Absolute 0.1 0.0 - 0.5 K/uL   Basophils Relative 1 %   Basophils Absolute 0.0 0.0 - 0.1 K/uL   Immature Granulocytes 0 %   Abs Immature Granulocytes 0.03 0.00 - 0.07 K/uL    Comment: Performed at [facility], [address] [place], [place] [address]  Type and screen [facility]     Status: None   Collection Time: 09/15/23  3:40 PM  Result Value Ref Range   ABO/RH(D) A POS    Antibody Screen NEG    Sample Expiration 09/18/2023,2359    Unit Number [id number]    Blood Component Type RED CELLS,LR    Unit division 00    Status of Unit ISSUED,FINAL    Transfusion Status OK TO TRANSFUSE    Crossmatch Result Compatible    Unit Number [id number]    Blood Component Type RED CELLS,LR    Unit division 00    Status of Unit ISSUED,FINAL    Transfusion Status OK TO TRANSFUSE    Crossmatch Result      Compatible Performed at [facility], [address] [place], [place] [address]   BPAM RBC     Status: None   Collection Time: 09/15/23  3:40 PM  Result Value Ref Range   ISSUE DATE / TIME 202542706237    Blood Product Unit Number [id number]    PRODUCT CODE [id number]    Unit Type and Rh 6200    Blood Product Expiration Date 485462703500    ISSUE DATE / TIME [id number]    Blood Product Unit Number [id number]  PRODUCT CODE [id number]    Unit Type and Rh 6200    Blood Product Expiration Date [id number]   Prepare RBC (crossmatch)     Status: None   Collection Time: 09/15/23  6:51 PM  Result Value Ref Range   Order Confirmation      ORDER PROCESSED BY BLOOD BANK Performed at [facility], [address] [place], [place] [address]   CBC     Status: Abnormal   Collection Time: 09/16/23  2:19 AM  Result Value Ref Range   WBC 5.9 4.0 - 10.5 K/uL   RBC 2.78 (L) 3.87 - 5.11 MIL/uL    Hemoglobin 8.0 (L) 12.0 - 15.0 g/dL    Comment: REPEATED TO VERIFY   HCT 25.3 (L) 36.0 - 46.0 %   MCV 91.0 80.0 - 100.0 fL    Comment: REPEATED TO VERIFY   MCH 28.8 26.0 - 34.0 pg   MCHC 31.6 30.0 - 36.0 g/dL   RDW 13.0 (H) 86.5 - 78.4 %   Platelets 426 (H) 150 - 400 K/uL   nRBC 0.3 (H) 0.0 - 0.2 %    Comment: Performed at [facility], [address] [place], [place] [address]  CBC     Status: Abnormal   Collection Time: 09/16/23  4:50 AM  Result Value Ref Range   WBC 5.7 4.0 - 10.5 K/uL   RBC 2.71 (L) 3.87 - 5.11 MIL/uL   Hemoglobin 7.8 (L) 12.0 - 15.0 g/dL   HCT 52.8 (L) 41.3 - 24.4 %   MCV 94.8 80.0 - 100.0 fL   MCH 28.8 26.0 - 34.0 pg   MCHC 30.4 30.0 - 36.0 g/dL   RDW 01.0 (H) 27.2 - 53.6 %   Platelets 416 (H) 150 - 400 K/uL   nRBC 0.0 0.0 - 0.2 %    Comment: Performed at [facility], [address] [place], [place] [address]  Basic metabolic panel     Status: Abnormal   Collection Time: 09/16/23  4:50 AM  Result Value Ref Range   Sodium 138 135 - 145 mmol/L   Potassium 4.1 3.5 - 5.1 mmol/L   Chloride 108 98 - 111 mmol/L   CO2 20 (L) 22 - 32 mmol/L   Glucose, Bld 96 70 - 99 mg/dL    Comment: Glucose reference range applies only to samples taken after fasting for at least 8 hours.   BUN 24 (H) 8 - 23 mg/dL   Creatinine, Ser 4.74 0.44 - 1.00 mg/dL   Calcium 8.1 (L) 8.9 - 10.3 mg/dL   GFR, Estimated >25 >95 mL/min    Comment: (NOTE) Calculated using the CKD-EPI Creatinine Equation (2021)    Anion gap 10 5 - 15    Comment: Performed at [facility], [address] [place], [place] [address]  APTT     Status: None   Collection Time: 09/16/23  4:50 AM  Result Value Ref Range   aPTT 26 24 - 36 seconds    Comment: Performed at [facility], [address] [place], [place] [address]  Protime-INR     Status: Abnormal   Collection Time: 09/16/23  4:50 AM  Result Value Ref Range   Prothrombin Time 15.4 (H) 11.4 - 15.2 seconds    INR 1.2 0.8 - 1.2    Comment: (NOTE) INR goal varies based on device and disease states. Performed at [facility], [address], [place], [place] [address]   CBG monitoring, ED  Status: Abnormal   Collection Time: 09/16/23  7:58 AM  Result Value Ref Range   Glucose-Capillary 122 (H) 70 - 99 mg/dL    Comment: Glucose reference range applies only to samples taken after fasting for at least 8 hours.  Comprehensive metabolic panel     Status: Abnormal   Collection Time: 09/17/23  4:59 AM  Result Value Ref Range   Sodium 140 135 - 145 mmol/L   Potassium 4.1 3.5 - 5.1 mmol/L   Chloride 109 98 - 111 mmol/L   CO2 24 22 - 32 mmol/L   Glucose, Bld 110 (H) 70 - 99 mg/dL    Comment: Glucose reference range applies only to samples taken after fasting for at least 8 hours.   BUN 15 8 - 23 mg/dL   Creatinine, Ser 9.56 0.44 - 1.00 mg/dL   Calcium 8.4 (L) 8.9 - 10.3 mg/dL   Total Protein 5.3 (L) 6.5 - 8.1 g/dL   Albumin 3.1 (L) 3.5 - 5.0 g/dL   AST 15 15 - 41 U/L   ALT 9 0 - 44 U/L   Alkaline Phosphatase 37 (L) 38 - 126 U/L   Total Bilirubin 0.4 0.0 - 1.2 mg/dL   GFR, Estimated >21 >30 mL/min    Comment: (NOTE) Calculated using the CKD-EPI Creatinine Equation (2021)    Anion gap 7 5 - 15    Comment: Performed at [facility], [address] [place], [place] [address]  CBC with Differential/Platelet     Status: Abnormal   Collection Time: 09/17/23  4:59 AM  Result Value Ref Range   WBC 6.8 4.0 - 10.5 K/uL   RBC 2.87 (L) 3.87 - 5.11 MIL/uL   Hemoglobin 8.4 (L) 12.0 - 15.0 g/dL   HCT 46.9 (L) 62.9 - 52.8 %   MCV 92.3 80.0 - 100.0 fL   MCH 29.3 26.0 - 34.0 pg   MCHC 31.7 30.0 - 36.0 g/dL   RDW 41.3 (H) 24.4 - 01.0 %   Platelets 412 (H) 150 - 400 K/uL   nRBC 0.0 0.0 - 0.2 %   Neutrophils Relative % 75 %   Neutro Abs 5.1 1.7 - 7.7 K/uL   Lymphocytes Relative 8 %   Lymphs Abs 0.6 (L) 0.7 - 4.0 K/uL   Monocytes Relative 13 %   Monocytes Absolute 0.9 0.1 - 1.0  K/uL   Eosinophils Relative 3 %   Eosinophils Absolute 0.2 0.0 - 0.5 K/uL   Basophils Relative 1 %   Basophils Absolute 0.0 0.0 - 0.1 K/uL   Immature Granulocytes 0 %   Abs Immature Granulocytes 0.03 0.00 - 0.07 K/uL    Comment: Performed at [facility], [address] [place], [place] [address]  Phosphorus     Status: None   Collection Time: 09/17/23  4:59 AM  Result Value Ref Range   Phosphorus 3.5 2.5 - 4.6 mg/dL    Comment: Performed at [facility], [address] [place], [place] [address]  Magnesium     Status: None   Collection Time: 09/17/23  4:59 AM  Result Value Ref Range   Magnesium 2.1 1.7 - 2.4 mg/dL    Comment: Performed at [facility], [address] [place], [place] [address]  Glucose, capillary     Status: Abnormal   Collection Time: 09/17/23  7:47 AM  Result Value Ref Range   Glucose-Capillary 141 (H) 70 - 99 mg/dL    Comment: Glucose reference range applies only to  samples taken after fasting for at least 8 hours.  Glucose, capillary     Status: Abnormal   Collection Time: 09/17/23 11:19 AM  Result Value Ref Range   Glucose-Capillary 138 (H) 70 - 99 mg/dL    Comment: Glucose reference range applies only to samples taken after fasting for at least 8 hours.  CBC with Differential/Platelet     Status: Abnormal   Collection Time: 09/23/23  9:45 AM  Result Value Ref Range   WBC 8.6 3.4 - 10.8 x10E3/uL   RBC 3.35 (L) 3.77 - 5.28 x10E6/uL   Hemoglobin 9.6 (L) 11.1 - 15.9 g/dL   Hematocrit 13.0 (L) 86.5 - 46.6 %   MCV 96 79 - 97 fL   MCH 28.7 26.6 - 33.0 pg   MCHC 30.0 (L) 31.5 - 35.7 g/dL   RDW 78.4 (H) 69.6 - 29.5 %   Platelets 424 150 - 450 x10E3/uL   Neutrophils 84 Not Estab. %   Lymphs 7 Not Estab. %   Monocytes 7 Not Estab. %   Eos 2 Not Estab. %   Basos 0 Not Estab. %   Neutrophils Absolute 7.2 (H) 1.4 - 7.0 x10E3/uL   Lymphocytes Absolute 0.6 (L) 0.7 - 3.1 x10E3/uL   Monocytes Absolute 0.6 0.1 - 0.9 x10E3/uL   EOS  (ABSOLUTE) 0.1 0.0 - 0.4 x10E3/uL   Basophils Absolute 0.0 0.0 - 0.2 x10E3/uL   Immature Granulocytes 0 Not Estab. %   Immature Grans (Abs) 0.0 0.0 - 0.1 x10E3/uL  Iron, TIBC and Ferritin Panel     Status: Abnormal   Collection Time: 09/23/23  9:45 AM  Result Value Ref Range   Total Iron Binding Capacity 322 250 - 450 ug/dL   UIBC 284 132 - 440 ug/dL   Iron 44 27 - 102 ug/dL   Iron Saturation 14 (L) 15 - 55 %   Ferritin 91 15 - 150 ng/mL  CBC with Differential/Platelet     Status: Abnormal   Collection Time: 10/18/23 10:45 AM  Result Value Ref Range   WBC 10.2 3.4 - 10.8 x10E3/uL   RBC 2.67 (LL) 3.77 - 5.28 x10E6/uL   Hemoglobin 8.1 (L) 11.1 - 15.9 g/dL   Hematocrit 72.5 (L) 36.6 - 46.6 %   MCV 104 (H) 79 - 97 fL   MCH 30.3 26.6 - 33.0 pg   MCHC 29.1 (L) 31.5 - 35.7 g/dL   RDW 44.0 (H) 34.7 - 42.5 %   Platelets 452 (H) 150 - 450 x10E3/uL   Neutrophils 83 Not Estab. %   Lymphs 7 Not Estab. %   Monocytes 8 Not Estab. %   Eos 1 Not Estab. %   Basos 0 Not Estab. %   Neutrophils Absolute 8.6 (H) 1.4 - 7.0 x10E3/uL   Lymphocytes Absolute 0.7 0.7 - 3.1 x10E3/uL   Monocytes Absolute 0.8 0.1 - 0.9 x10E3/uL   EOS (ABSOLUTE) 0.1 0.0 - 0.4 x10E3/uL   Basophils Absolute 0.0 0.0 - 0.2 x10E3/uL   Immature Granulocytes 1 Not Estab. %   Immature Grans (Abs) 0.1 0.0 - 0.1 x10E3/uL  Iron and TIBC([facility])     Status: Abnormal   Collection Time: 10/25/23 12:59 PM  Result Value Ref Range   Iron 29 28 - 170 ug/dL   TIBC 956 387 - 564 ug/dL   Saturation Ratios 8 (L) 10.4 - 31.8 %   UIBC 321 ug/dL    Comment: Performed at [facility] [facility], [address], [place], [place] [address]  Ferritin  Status: None   Collection Time: 10/25/23 12:59 PM  Result Value Ref Range   Ferritin 131 11 - 307 ng/mL    Comment: Performed at [facility], [address] [place], [place] [address]  CBC with Differential/Platelet     Status: Abnormal   Collection Time: 10/25/23 12:59 PM   Result Value Ref Range   WBC 9.4 4.0 - 10.5 K/uL   RBC 2.37 (L) 3.87 - 5.11 MIL/uL   Hemoglobin 7.5 (L) 12.0 - 15.0 g/dL   HCT 60.4 (L) 54.0 - 98.1 %   MCV 109.3 (H) 80.0 - 100.0 fL   MCH 31.6 26.0 - 34.0 pg   MCHC 29.0 (L) 30.0 - 36.0 g/dL   RDW 19.1 (H) 47.8 - 29.5 %   Platelets 396 150 - 400 K/uL   nRBC 0.3 (H) 0.0 - 0.2 %   Neutrophils Relative % 90 %   Neutro Abs 8.5 (H) 1.7 - 7.7 K/uL   Lymphocytes Relative 4 %   Lymphs Abs 0.4 (L) 0.7 - 4.0 K/uL   Monocytes Relative 5 %   Monocytes Absolute 0.4 0.1 - 1.0 K/uL   Eosinophils Relative 0 %   Eosinophils Absolute 0.0 0.0 - 0.5 K/uL   Basophils Relative 0 %   Basophils Absolute 0.0 0.0 - 0.1 K/uL   Immature Granulocytes 1 %   Abs Immature Granulocytes 0.07 0.00 - 0.07 K/uL    Comment: Performed at [facility], [address] [place], [place] [address]  Kappa/lambda light chains     Status: Abnormal   Collection Time: 10/25/23  2:03 PM  Result Value Ref Range   Kappa free light chain 24.2 (H) 3.3 - 19.4 mg/L   Lambda free light chains 15.3 5.7 - 26.3 mg/L   Kappa, lambda light chain ratio 1.58 0.26 - 1.65    Comment: (NOTE) Performed At: [facility] [address] [place], [place] [id number] Jolene Schimke MD EX:[id number]   Multiple Myeloma Panel (SPEP&IFE w/QIG)     Status: Abnormal   Collection Time: 10/25/23  2:03 PM  Result Value Ref Range   IgG (Immunoglobin G), Serum 573 (L) 586 - 1,602 mg/dL   IgA 102 64 - 725 mg/dL   IgM (Immunoglobulin M), Srm 53 26 - 217 mg/dL   Total Protein ELP 5.7 (L) 6.0 - 8.5 g/dL   Albumin SerPl Elph-Mcnc 3.4 2.9 - 4.4 g/dL   Alpha 1 0.3 0.0 - 0.4 g/dL   Alpha2 Glob SerPl Elph-Mcnc 0.6 0.4 - 1.0 g/dL   B-Globulin SerPl Elph-Mcnc 0.8 0.7 - 1.3 g/dL   Gamma Glob SerPl Elph-Mcnc 0.5 0.4 - 1.8 g/dL   M Protein SerPl Elph-Mcnc Not Observed Not Observed g/dL   Globulin, Total 2.3 2.2 - 3.9 g/dL   Albumin/Glob SerPl 1.5 0.7 - 1.7   IFE 1 Comment     Comment: (NOTE) The  immunofixation pattern appears unremarkable. Evidence of monoclonal protein is not apparent.    Please Note Comment     Comment: (NOTE) Protein electrophoresis scan will follow via computer, mail, or courier delivery. Performed At: [facility] [address] [place], [place] [id number] Jolene Schimke MD QQ:[id number]   Reticulocytes     Status: Abnormal   Collection Time: 10/25/23  2:03 PM  Result Value Ref Range   Retic Ct Pct 7.1 (H) 0.4 - 3.1 %   RBC. 2.40 (L) 3.87 - 5.11 MIL/uL   Retic Count, Absolute 170.4 19.0 - 186.0 K/uL   Immature Retic  Fract 39.9 (H) 2.3 - 15.9 %    Comment: Performed at [facility], [address] [place], [place] [address]  Lactate dehydrogenase     Status: None   Collection Time: 10/25/23  2:03 PM  Result Value Ref Range   LDH 126 98 - 192 U/L    Comment: Performed at [facility], [address] [place], [place] [address]  Haptoglobin     Status: None   Collection Time: 10/25/23  2:03 PM  Result Value Ref Range   Haptoglobin 134 42 - 346 mg/dL    Comment: (NOTE) Performed At: [facility] [address] [place], [place] [id number] Jolene Schimke MD [id number]   Folate     Status: None   Collection Time: 10/25/23  2:03 PM  Result Value Ref Range   Folate 11.1 >5.9 ng/mL    Comment: Performed at [facility], [address] [place], [place] [address]  Vitamin B12     Status: None   Collection Time: 10/25/23  2:03 PM  Result Value Ref Range   Vitamin B-12 464 180 - 914 pg/mL    Comment: (NOTE) This assay is not validated for testing neonatal or myeloproliferative syndrome specimens for Vitamin B12 levels. Performed at [facility], [address], [place], [place] [address]   CBC with Differential (Cancer Center Only)     Status: Abnormal   Collection Time: 11/02/23  8:51 AM  Result Value Ref Range   WBC Count 7.0 4.0 - 10.5 K/uL   RBC 2.37 (L) 3.87 - 5.11 MIL/uL   Hemoglobin 7.3 (L)  12.0 - 15.0 g/dL   HCT 02.7 (L) 25.3 - 66.4 %   MCV 105.5 (H) 80.0 - 100.0 fL   MCH 30.8 26.0 - 34.0 pg   MCHC 29.2 (L) 30.0 - 36.0 g/dL   RDW 40.3 (H) 47.4 - 25.9 %   Platelet Count 433 (H) 150 - 400 K/uL   nRBC 0.3 (H) 0.0 - 0.2 %   Neutrophils Relative % 79 %   Neutro Abs 5.6 1.7 - 7.7 K/uL   Lymphocytes Relative 7 %   Lymphs Abs 0.5 (L) 0.7 - 4.0 K/uL   Monocytes Relative 10 %   Monocytes Absolute 0.7 0.1 - 1.0 K/uL   Eosinophils Relative 3 %   Eosinophils Absolute 0.2 0.0 - 0.5 K/uL   Basophils Relative 1 %   Basophils Absolute 0.1 0.0 - 0.1 K/uL   Immature Granulocytes 0 %   Abs Immature Granulocytes 0.02 0.00 - 0.07 K/uL    Comment: Performed at [facility], [address] [place], [place] [address]  Bilirubin, Total and Direct (Cancer Center Only)     Status: None   Collection Time: 11/02/23  8:51 AM  Result Value Ref Range   Total Bilirubin 0.5 0.0 - 1.2 mg/dL   Bilirubin, Direct <5.6 0.0 - 0.2 mg/dL   Indirect Bilirubin NOT CALCULATED 0.3 - 0.9 mg/dL    Comment: Performed at [facility], [address] [place], [place] [address]  Prepare RBC (crossmatch)     Status: None   Collection Time: 11/02/23  8:51 AM  Result Value Ref Range   Order Confirmation      ORDER PROCESSED BY BLOOD BANK Performed at [facility], [address] [place], [place] [address]   Type and screen     Status: None   Collection Time: 11/02/23  8:51 AM  Result Value Ref Range  ABO/RH(D) A POS    Antibody Screen POS    Sample Expiration 11/05/2023,2359    Antibody Identification NON SPECIFIC ANTIBODY REACTIVITY    Unit Number [id number]    Blood Component Type RED CELLS,LR    Unit division 00    Status of Unit ISSUED,FINAL    Transfusion Status OK TO TRANSFUSE    Crossmatch Result COMPATIBLE   BPAM RBC     Status: None   Collection Time: 11/02/23  8:51 AM  Result Value Ref Range   ISSUE DATE / TIME [id number]    Blood Product Unit Number  [id number]    PRODUCT CODE [id number]    Unit Type and Rh 6200    Blood Product Expiration Date [id number]     Radiology No results found.  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  ABIs are 1.18 on the right and 1.17 on the left with triphasic waveforms.  Would continue dual antiplatelet therapy and statin agent at least until she follows up in about 3 months and then we can discuss removing one of the antiplatelet agents.  HTN (hypertension) blood pressure control important in reducing the progression of atherosclerotic disease. On appropriate oral medications.   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 Also, involved in wound healing. On appropriate medications.   Hyperlipidemia lipid control important in reducing the progression of atherosclerotic disease. Continue statin therapy    Festus Barren, MD  11/11/2023 2:22 PM    This note was created with Dragon medical transcription system.  Any errors from dictation are purely unintentional

## 2023-11-11 NOTE — Assessment & Plan Note (Signed)
 blood glucose control important in reducing the progression of atherosclerotic disease. Also, involved in wound healing. On appropriate medications.

## 2023-11-11 NOTE — Assessment & Plan Note (Signed)
 lipid control important in reducing the progression of atherosclerotic disease. Continue statin therapy

## 2023-11-14 LAB — VAS US ABI WITH/WO TBI
Left ABI: 1.17
Right ABI: 1.18

## 2023-11-15 ENCOUNTER — Ambulatory Visit: Admitting: Internal Medicine

## 2023-11-15 ENCOUNTER — Other Ambulatory Visit

## 2023-11-16 ENCOUNTER — Inpatient Hospital Stay

## 2023-11-16 VITALS — BP 136/57 | HR 55 | Temp 97.7°F | Resp 18

## 2023-11-16 DIAGNOSIS — D500 Iron deficiency anemia secondary to blood loss (chronic): Secondary | ICD-10-CM

## 2023-11-16 DIAGNOSIS — D509 Iron deficiency anemia, unspecified: Secondary | ICD-10-CM | POA: Diagnosis not present

## 2023-11-16 MED ORDER — IRON SUCROSE 20 MG/ML IV SOLN
200.0000 mg | Freq: Once | INTRAVENOUS | Status: AC
  Administered 2023-11-16: 200 mg via INTRAVENOUS
  Filled 2023-11-16: qty 10

## 2023-11-16 NOTE — Progress Notes (Signed)
 Patient declined to wait the 30 minutes for post iron infusion observation today. Tolerated infusion well. VSS.

## 2023-11-23 ENCOUNTER — Inpatient Hospital Stay

## 2023-11-23 VITALS — BP 110/61 | HR 60 | Temp 97.2°F | Resp 18

## 2023-11-23 DIAGNOSIS — D509 Iron deficiency anemia, unspecified: Secondary | ICD-10-CM | POA: Diagnosis not present

## 2023-11-23 DIAGNOSIS — D500 Iron deficiency anemia secondary to blood loss (chronic): Secondary | ICD-10-CM

## 2023-11-23 MED ORDER — IRON SUCROSE 20 MG/ML IV SOLN
200.0000 mg | Freq: Once | INTRAVENOUS | Status: AC
  Administered 2023-11-23: 200 mg via INTRAVENOUS
  Filled 2023-11-23: qty 10

## 2023-11-23 NOTE — Patient Instructions (Signed)

## 2023-11-28 ENCOUNTER — Inpatient Hospital Stay (HOSPITAL_BASED_OUTPATIENT_CLINIC_OR_DEPARTMENT_OTHER): Admitting: Internal Medicine

## 2023-11-28 ENCOUNTER — Other Ambulatory Visit: Payer: Self-pay

## 2023-11-28 ENCOUNTER — Inpatient Hospital Stay

## 2023-11-28 ENCOUNTER — Encounter: Payer: Self-pay | Admitting: Internal Medicine

## 2023-11-28 VITALS — BP 113/68 | HR 56 | Temp 97.9°F | Resp 18 | Ht 65.0 in | Wt 97.5 lb

## 2023-11-28 DIAGNOSIS — D539 Nutritional anemia, unspecified: Secondary | ICD-10-CM

## 2023-11-28 DIAGNOSIS — D500 Iron deficiency anemia secondary to blood loss (chronic): Secondary | ICD-10-CM

## 2023-11-28 DIAGNOSIS — D509 Iron deficiency anemia, unspecified: Secondary | ICD-10-CM | POA: Diagnosis not present

## 2023-11-28 LAB — CBC WITH DIFFERENTIAL (CANCER CENTER ONLY)
Abs Immature Granulocytes: 0.02 10*3/uL (ref 0.00–0.07)
Basophils Absolute: 0 10*3/uL (ref 0.0–0.1)
Basophils Relative: 1 %
Eosinophils Absolute: 0.1 10*3/uL (ref 0.0–0.5)
Eosinophils Relative: 2 %
HCT: 28.6 % — ABNORMAL LOW (ref 36.0–46.0)
Hemoglobin: 8.6 g/dL — ABNORMAL LOW (ref 12.0–15.0)
Immature Granulocytes: 0 %
Lymphocytes Relative: 13 %
Lymphs Abs: 0.8 10*3/uL (ref 0.7–4.0)
MCH: 33.6 pg (ref 26.0–34.0)
MCHC: 30.1 g/dL (ref 30.0–36.0)
MCV: 111.7 fL — ABNORMAL HIGH (ref 80.0–100.0)
Monocytes Absolute: 0.7 10*3/uL (ref 0.1–1.0)
Monocytes Relative: 12 %
Neutro Abs: 4.3 10*3/uL (ref 1.7–7.7)
Neutrophils Relative %: 72 %
Platelet Count: 357 10*3/uL (ref 150–400)
RBC: 2.56 MIL/uL — ABNORMAL LOW (ref 3.87–5.11)
RDW: 19.5 % — ABNORMAL HIGH (ref 11.5–15.5)
WBC Count: 6 10*3/uL (ref 4.0–10.5)
nRBC: 0 % (ref 0.0–0.2)

## 2023-11-28 LAB — SAMPLE TO BLOOD BANK

## 2023-11-28 NOTE — Progress Notes (Signed)
 [facility]  [phone] [phone]  ID: Yolanda Jimenez OB: 1946/04/27  MR#: [id number]  HYQ#:[id number]  Patient Care Team: Sari Cunning, MD as PCP - General (Unknown Physician Specialty) Loreatha Rodney, MD as Consulting Physician (Oncology)   HPI: Yolanda Jimenez is a 78 y.o. female with past medical history of A-fib, arthritis, asthma, AVM, Barrett's esophagus, carotid stenosis, COPD, diabetes, GERD, IBS, PVD referred to hematology for management of iron  deficiency anemia.  Patient reports longstanding history of iron  deficiency anemia.  She has been on for count for many years.  Recently has been going through depression due to multiple difficult events happening in her life.  As a result she stopped her iron  supplements.  She went back on it 3 to 4 days ago. Reports with her history of IBS sometimes she has voluminous and mushy stools.  After frequent wiping can notice some blood on the tissue paper at the end.   Colonoscopy from 06/27/2020 by Dr. Ole Berkeley showed multiple polyps and diverticulosis.  Pathology showed tubular adenoma x 5 and hyperplastic polyp x 1.  Follows with Dr. Ace Holder of urology for microscopic hematuria.  Had CT scans and urine cytology which was unremarkable.Yolanda Jimenez  Has nocturia and was started on Mybetriq.    PVD - s/p bilateral femoral endarterectomies with stent placement on 08/24/2023 by Dr. Vonna Guardian.  Preop hemoglobin was 10.5.  09/15/23 -sent to ER for hemoglobin 6.1. s/p upper endoscopy with Dr. 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  Recommended colonoscopy in 6 months when she is able to hold Plavix .  10/25/23 -completed IV Venofer  x 4 doses.  Hemoglobin 7.5, ferritin 130, saturation 8%.  Treated with 4 more doses of IV Venofer .  Also received 1 unit PRBC for Hb 7.2 and symptomatic.  Hemoglobin today is 8.6.  She has worsening macrocytosis.  Vitamin B12/folate normal.   Reticulocytosis elevated 7%.  LDH/haptoglobin normal.  Myeloma panel negative.   Interval history Patient seen today as follow-up for anemia and labs. Patient continues to have palpitations primarily in the mornings.  Has occasional dizziness which is postural.  She is scheduled to follow-up with Dr. Annabell Key this Thursday and we will further discuss about evaluation by cardiology if needed.  Denies any bleeding in stools or black tarry stools.  She reports mild numbness on the left thigh which has started after the angiogram procedure.  Does not bother her or affect her ambulation.  She does have intermittent pain in the left hip previously had ORIF for fracture.  REVIEW OF SYSTEMS:   ROS  As per HPI. Otherwise, a complete review of systems is negative.  PAST MEDICAL HISTORY: Past Medical History:  Diagnosis Date   A-fib [facility])    a.) CHA2DS2-VASc = 5 (age x2, sex, vascular disease history, T2DM) as of 08/23/2023; b.) cardiac rate/rhythm maintained on oral metoprolol  succinate; no chronic OAC   Abnormal vaginal Pap smear 2015   Anxiety    a.) on BZO PRN (diazepam )   Aortic atherosclerosis (HCC)    Aortic stenosis 05/03/2023   a.) TTE 05/03/2023: mild AS (MPG 12 mmHg; AVA = 1.3 cm2)   Arthritis of spine    Asthma    Barrett esophagus    Benign hematuria 08/29/2014   W/u neg  Normal renal ultrasound 2/19  Cystoscopy normal 2019, [place]  CT -9/24     Bilateral carotid artery disease (HCC)    a.) carotid dopplers 10/13/2016, 10/24/2018, 10/26/2019, 10/21/2020,  10/22/2022: 1-39% BICA   C. difficile diarrhea    CAD (coronary artery disease)    a.) CT chest 07/12/2018: LAD calcifications; b.) CT CAP 11/18/2022: 3 vessel CAD; c.) cPET 05/19/2023: severe 4 vessel coronary calcs mainly in LAD distribution; d.) cPET 05/03/2023: small/mild reversible defect in mid-anterosep/ant region   Complication of anesthesia    a.) delayed emergence; b.) emergence delirium; "I get really loopy after  surgery"   COPD (chronic obstructive pulmonary disease) (HCC)    DDD (degenerative disc disease), lumbosacral    Depression    Diverticulosis    Epigastric hernia    GERD (gastroesophageal reflux disease)    Glaucoma    Heart murmur    Heart palpitations    History of bilateral cataract extraction 2015   History of recurrent UTIs    Hx of adenomatous colonic polyps    Hyperlipidemia    IBS (irritable bowel syndrome)    Insomnia    Iron  deficiency anemia    Kidney cysts    Long term current use of aspirin     Macular degeneration of left eye    Osteoporosis    a.) on RANKLi (denosumab )   PVD (peripheral vascular disease) with claudication (HCC)    Small bowel arteriovenous malformation 2007   a.) noted on VCE in 2007   Type 2 diabetes, diet controlled (HCC)    Wears dentures    partial lower    PAST SURGICAL HISTORY: Past Surgical History:  Procedure Laterality Date   ABDOMINAL AORTIC ENDOVASCULAR STENT GRAFT N/A 08/24/2023   Procedure: ABDOMINAL AORTIC ENDOVASCULAR STENT GRAFT;  Surgeon: Celso College, MD;  Location: [facility] ORS;  Service: Vascular;  Laterality: N/A;   BUNIONECTOMY Right    CATARACT EXTRACTION Bilateral    CERVICAL CONE BIOPSY     CHOLECYSTECTOMY     COLONOSCOPY     COLONOSCOPY WITH PROPOFOL  N/A 01/22/2019   Procedure: COLONOSCOPY WITH BIOPSY;  Surgeon: Marnee Sink, MD;  Location: [facility];  Service: Endoscopy;  Laterality: N/A;  diabetic - diet controlled   COLONOSCOPY WITH PROPOFOL  N/A 06/27/2020   Procedure: COLONOSCOPY WITH BIOPSY;  Surgeon: Marnee Sink, MD;  Location: [facility];  Service: Endoscopy;  Laterality: N/A;  priority 4   ENDARTERECTOMY FEMORAL Bilateral 08/24/2023   Procedure: ENDARTERECTOMY FEMORAL;  Surgeon: Celso College, MD;  Location: [facility] ORS;  Service: Vascular;  Laterality: Bilateral;   ESOPHAGOGASTRODUODENOSCOPY (EGD) WITH PROPOFOL  N/A 08/29/2017   Procedure: ESOPHAGOGASTRODUODENOSCOPY (EGD) WITH PROPOFOL ;   Surgeon: Marnee Sink, MD;  Location: [facility];  Service: Endoscopy;  Laterality: N/A;   ESOPHAGOGASTRODUODENOSCOPY (EGD) WITH PROPOFOL  N/A 09/16/2023   Procedure: ESOPHAGOGASTRODUODENOSCOPY (EGD) WITH PROPOFOL ;  Surgeon: Marnee Sink, MD;  Location: [facility] ENDOSCOPY;  Service: Endoscopy;  Laterality: N/A;   FOOT SURGERY     right 2nd toe   HIP ARTHROPLASTY Left 05/16/2018   Procedure: ARTHROPLASTY BIPOLAR HIP (HEMIARTHROPLASTY);  Surgeon: Elner Hahn, MD;  Location: [facility] ORS;  Service: Orthopedics;  Laterality: Left;   HOT HEMOSTASIS  09/16/2023   Procedure: HOT HEMOSTASIS (ARGON PLASMA COAGULATION/BICAP);  Surgeon: Marnee Sink, MD;  Location: [facility] END[facility];  Service: Endoscopy;;   INSERTION OF ILIAC STENT Bilateral 08/24/2023   Procedure: INSERTION OF ILIAC STENT;  Surgeon: Celso College, MD;  Location: [facility] OR[facility];  Service: Vascular;  Laterality: Bilateral;   LOWER EXTREMITY ANGIOGRAPHY Left 04/14/2023   Procedure: Lower Extremity Angiography;  Surgeon: Celso College, MD;  Location: [facility] INVAS[facility] CV [facility];  Service: Cardiovascular;  Laterality: Left;  ORIF PERIPROSTHETIC FRACTURE Left 12/10/2021   Procedure: OPEN REDUCTION INTERNAL FIXATION OF A LEFT GREATER TROCHANTERIC FRACTURE;  Surgeon: Elner Hahn, MD;  Location: [facility] OR[facility];  Service: Orthopedics;  Laterality: Left;   ORIF WRIST FRACTURE Right 10/07/2020   Procedure: OPEN REDUCTION INTERNAL FIXATION (ORIF) WRIST FRACTURE;  Surgeon: Molli Angelucci, MD;  Location: [facility] OR[facility];  Service: Orthopedics;  Laterality: Right;   POLYPECTOMY N/A 01/22/2019   Procedure: POLYPECTOMY;  Surgeon: Marnee Sink, MD;  Location: [facility];  Service: Endoscopy;  Laterality: N/A;  2 Clips placed at polyp removal site in ascending colon   POLYPECTOMY N/A 06/27/2020   Procedure: POLYPECTOMY;  Surgeon: Marnee Sink, MD;  Location: [facility];  Service: Endoscopy;  Laterality: N/A;   TUBAL LIGATION     UPPER GASTROINTESTINAL ENDOSCOPY       FAMILY HISTORY: Family History  Problem Relation Age of Onset   Diabetes Mother    Heart disease Mother    Hypertension Mother    Diabetes Son    Diabetes Sister    Inflammatory bowel disease Father        diverticulitis   Multiple myeloma Father    Hypertension Son    Diabetes Maternal Grandmother    Colon cancer Neg Hx    Esophageal cancer Neg Hx    Rectal cancer Neg Hx    Stomach cancer Neg Hx    Breast cancer Neg Hx     HEALTH MAINTENANCE: Social History   Tobacco Use   Smoking status: Every Day    Current packs/day: 0.50    Average packs/day: 0.5 packs/day for 55.0 years (27.5 ttl pk-yrs)    Types: Cigarettes   Smokeless tobacco: Never   Tobacco comments:    she has patches to start just not started yet. smoked since teenager.  Vaping Use   Vaping status: Never Used  Substance Use Topics   Alcohol use: Yes    Alcohol/week: 2.0 standard drinks of alcohol    Types: 2 Glasses of wine per week    Comment: occ wine 1-2 weekly   Drug use: No     Allergies  Allergen Reactions   Codeine     "felt drunk, crazy"   Hydrocodone Other (See Comments)   Hydrocodone-Acetaminophen  Other (See Comments)    Confusion/delirium   Lidocaine      Heart racing, increased BP - from local at dentist (likely epi)   Nsaids Other (See Comments)    Avoids due to IBS symptoms (bleeding symptoms)   Oxycodone      Confusion/delirium    Current Outpatient Medications  Medication Sig Dispense Refill   acetaminophen  (TYLENOL ) 500 MG tablet Take 1,000 mg by mouth every 6 (six) hours as needed.     aspirin  EC 81 MG tablet Take 1 tablet (81 mg total) by mouth daily. Swallow whole. 150 tablet 2   buPROPion  (WELLBUTRIN ) 75 MG tablet Take 75 mg by mouth every morning.     Cholecalciferol  125 MCG (5000 UT) TABS Take 1 tablet by mouth daily.     clopidogrel  (PLAVIX ) 75 MG tablet Take 1 tablet (75 mg total) by mouth daily. 30 tablet 6   cyanocobalamin  1000 MCG tablet Take 1,000 mcg by  mouth daily.     denosumab  (PROLIA ) 60 MG/ML SOSY injection Inject 60 mg into the skin every 6 (six) months.     donepezil  (ARICEPT ) 5 MG tablet Take 1 tablet by mouth at bedtime.     Ferrous Gluconate  239 (27 Fe) MG TABS  Take 1 tablet by mouth daily.     fluticasone  (FLONASE ) 50 MCG/ACT nasal spray Place 1 spray into both nostrils daily as needed for allergies or rhinitis.     loperamide  (IMODIUM ) 2 MG capsule Take 2 mg by mouth every morning.     metoprolol  succinate (TOPROL -XL) 25 MG 24 hr tablet Take 1 tablet by mouth daily.     pantoprazole  (PROTONIX ) 40 MG tablet Take 40 mg by mouth daily.     rosuvastatin  (CRESTOR ) 20 MG tablet Take 20 mg by mouth daily.     No current facility-administered medications for this visit.    OBJECTIVE: Vitals:   11/28/23 1333  BP: 113/68  Pulse: (!) 56  Resp: 18  Temp: 97.9 F (36.6 C)      Body mass index is 16.22 kg/m.      General: Well-developed, well-nourished, no acute distress. Eyes: Pink conjunctiva, anicteric sclera. HEENT: Normocephalic, moist mucous membranes, clear oropharnyx. Lungs: Clear to auscultation bilaterally. Heart: Regular rate and rhythm. No rubs, murmurs, or gallops. Abdomen: Soft, nontender, nondistended. No organomegaly noted, normoactive bowel sounds. Musculoskeletal: No edema, cyanosis, or clubbing. Neuro: Alert, answering all questions appropriately. Cranial nerves grossly intact. Skin: No rashes or petechiae noted. Psych: Normal affect. Lymphatics: No cervical, calvicular, axillary or inguinal LAD.   LAB RESULTS:  Lab Results  Component Value Date   NA 140 09/17/2023   K 4.1 09/17/2023   CL 109 09/17/2023   CO2 24 09/17/2023   GLUCOSE 110 (H) 09/17/2023   BUN 15 09/17/2023   CREATININE 0.70 09/17/2023   CALCIUM  8.4 (L) 09/17/2023   PROT 5.3 (L) 09/17/2023   ALBUMIN  3.1 (L) 09/17/2023   AST 15 09/17/2023   ALT 9 09/17/2023   ALKPHOS 37 (L) 09/17/2023   BILITOT 0.5 11/02/2023   GFRNONAA >60  09/17/2023   GFRAA >60 05/18/2018    Lab Results  Component Value Date   WBC 6.0 11/28/2023   NEUTROABS 4.3 11/28/2023   HGB 8.6 (L) 11/28/2023   HCT 28.6 (L) 11/28/2023   MCV 111.7 (H) 11/28/2023   PLT 357 11/28/2023    Lab Results  Component Value Date   TIBC 350 10/25/2023   TIBC 322 09/23/2023   TIBC 363 09/15/2023   FERRITIN 131 10/25/2023   FERRITIN 91 09/23/2023   FERRITIN 41 09/15/2023   IRONPCTSAT 8 (L) 10/25/2023   IRONPCTSAT 14 (L) 09/23/2023   IRONPCTSAT 8 (L) 09/15/2023     STUDIES: VAS US  ABI WITH/WO TBI Result Date: 11/14/2023  LOWER EXTREMITY DOPPLER STUDY Patient Name:  Yolanda Jimenez  Date of Exam:   11/11/2023 Medical Rec #: [id number]       Accession #:    [id number] Date of Birth: [birth year]       Patient Gender: F Patient Age:   8 years Exam Location:  [facility] Procedure:      VAS US  ABI WITH/WO TBI Referring Phys: --------------------------------------------------------------------------------  Indications: Claudication, and peripheral artery disease. High Risk Factors: Hyperlipidemia.  Vascular Interventions: 08/23/2022 Left common femoral, profunda femoris, and                         superficial femoral artery endarterectomies                         2. Right common femoral, profunda femoris, and  superficial femoral artery endarterectomies                         3. Aortogram and iliofemoral angiograms                         4. Use of the shockwave device for angioplasty and                         lithotripsy of the left common iliac artery with a 6 mm                         diameter by 6 cm length device                         5. Use of the shockwave device for angioplasty and                         lithotripsy of the right common iliac artery using an 8                         mm diameter by 6 cm length device                         6. Placement of an Endologix aortic stent graft with 20                         mm  proximal and 13 mm distal stents for treatment of                         occlusive disease.                         7. Lifestream balloon expandable stent placement into                         the right common iliac artery with 10 mm diameter by 38                         mm length Lifestream stent                         8. Lifestream balloon expandable stent placement of the                         left common iliac artery with 9 mm diameter by 58 mm                         length Lifestream stent. Comparison Study: 09/2023 Performing Technologist: Faustine Hoof RVT  Examination Guidelines: A complete evaluation includes at minimum, Doppler waveform signals and systolic blood pressure reading at the level of bilateral brachial, anterior tibial, and posterior tibial arteries, when vessel segments are accessible. Bilateral testing is considered an integral part of a complete examination. Photoelectric Plethysmograph (PPG) waveforms and toe systolic pressure readings are included as required and additional duplex testing as needed. Limited examinations for reoccurring indications may be performed as noted.  ABI Findings: +---------+------------------+-----+---------+--------+ Right  Rt Pressure (mmHg)IndexWaveform Comment  +---------+------------------+-----+---------+--------+ Brachial 110                                      +---------+------------------+-----+---------+--------+ PTA      122               1.06 triphasic         +---------+------------------+-----+---------+--------+ DP       136               1.18 triphasic         +---------+------------------+-----+---------+--------+ Great Toe45                0.39 Abnormal          +---------+------------------+-----+---------+--------+ +---------+------------------+-----+---------+-------+ Left     Lt Pressure (mmHg)IndexWaveform Comment +---------+------------------+-----+---------+-------+ Brachial 115                                      +---------+------------------+-----+---------+-------+ PTA      126               1.10 triphasic        +---------+------------------+-----+---------+-------+ DP       135               1.17 triphasic        +---------+------------------+-----+---------+-------+ Great Toe85                0.74 Abnormal         +---------+------------------+-----+---------+-------+ +-------+-----------+-----------+------------+------------+ ABI/TBIToday's ABIToday's TBIPrevious ABIPrevious TBI +-------+-----------+-----------+------------+------------+ Right  1.18       .39        1.14        .39          +-------+-----------+-----------+------------+------------+ Left   1.17       .74        1.11        .77          +-------+-----------+-----------+------------+------------+  Summary: Right: Resting right ankle-brachial index is within normal range. The right toe-brachial index is abnormal. Left: Resting left ankle-brachial index is within normal range. The left toe-brachial index is normal. *See table(s) above for measurements and observations.  Electronically signed by Mikki Alexander MD on 11/14/2023 at 3:40:40 PM.    Final      ASSESSMENT AND PLAN:   Yolanda Jimenez is a 78 y.o. female with pmh of A-fib, arthritis, asthma, AVM, Barrett's esophagus, carotid stenosis, COPD, diabetes, GERD, IBS, PVD referred to hematology for management of iron  deficiency anemia.  # Iron  deficiency anemia -Patient has longstanding history of iron  deficiency anemia.  Outpatient she has been treated with multiple doses of IV Venofer .  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  Repeat colonoscopy was recommended once she is able to get off dual antiplatelets.  -She has longstanding history of microscopic hematuria and follows with Dr. Ace Holder.  Had CT scans and urine cytology which was negative.   -Last  visit, patient developed new onset macrocytosis.  Vitamin B12/folate normal.  SPEP/IFE no M protein.  Kappa 24, lambda 15.3 with ratio of 1.58 which is normal.  LDH and haptoglobin is normal.  Reticulocyte count is elevated at 7.1% which may be contributing to macrocytosis. Ferritin 131 however iron  saturation is still low at  8%.  Treated with 4 more doses of IV Venofer .  Iron  levels not drawn today.  However her hemoglobin has mildly improved to 8.6.  She has worsening macrocytosis to 111 today.  Due to blunted response to iron  infusions and no other etiology evident on the lab work with worsening macrocytosis, I offered her bone marrow biopsy which she would like to proceed to rule out bone marrow disorder such as MDS.  Will schedule.  # Nocturia -Follows with Dr. Ace Holder.  She was started on Mybertiq.    # Depression -Continue with Effexor    # PVD -Follows with Dr. Vonna Guardian.   s/p bilateral femoral endarterectomies with stent placement on 08/24/2023  - On aspirin  and Plavix   # IBS-diarrhea -Follows with Dr. Ole Berkeley.  On Imodium .  Last colonoscopy November 2021 as above.  Orders Placed This Encounter  Procedures   CBC with Differential (Cancer Center Only)   Ferritin   Iron  and TIBC([facility])   RTC in 3 weeks with labs to discuss bone marrow biopsy results.  Patient expressed understanding and was in agreement with this plan. She also understands that She can call clinic at any time with any questions, concerns, or complaints.   I spent a total of 25 minutes reviewing chart data, face-to-face evaluation with the patient, counseling and coordination of care as detailed above.  Rontavious Albright, MD   11/28/2023 2:10 PM

## 2023-11-29 ENCOUNTER — Telehealth: Payer: Self-pay | Admitting: *Deleted

## 2023-11-29 NOTE — Telephone Encounter (Signed)
 Patient instructed on bone marrow biopsy for Friday 5/2 at 8:30a an arrive at 7:30a. Pt instructed to not eat or drink anything after midnight prior to procedure and to bring a driver. She was instructed to go to the heart[facility]vascular entrance of [facility] to on day of procedure.

## 2023-12-04 ENCOUNTER — Encounter: Payer: Self-pay | Admitting: Internal Medicine

## 2023-12-07 NOTE — H&P (Signed)
 Chief Complaint: Chronic macrocytic anemia - IR consulted for image guided bone marrow biopsy  Referring Provider(s): Agrawal, Kavita, MD   Supervising Physician: Myrlene Asper  Patient Status: [facility] - Out-pt  History of Present Illness: Yolanda Jimenez is a 78 y.o. female with pmhx of Afib, arthritis, asthma, AVM, barrett's esophagus, carotid stenosis, COPD, DM, GERD. Presenting to IR service for CT bone marrow biopsy. Has been following with Dr. Aris Bel with heme/onc for chronic anemia. Recently has been found to have macrocytosis refractory to iron  infusions with no other evident etiology. Pt now referred to Ir for bone marrow biopsy to rule out other etiology, such as MDS.   Patient is Full Code  Past Medical History:  Diagnosis Date   A-fib [facility])    a.) CHA2DS2-VASc = 5 (age x2, sex, vascular disease history, T2DM) as of 08/23/2023; b.) cardiac rate/rhythm maintained on oral metoprolol  succinate; no chronic OAC   Abnormal vaginal Pap smear 2015   Anxiety    a.) on BZO PRN (diazepam )   Aortic atherosclerosis (HCC)    Aortic stenosis 05/03/2023   a.) TTE 05/03/2023: mild AS (MPG 12 mmHg; AVA = 1.3 cm2)   Arthritis of spine    Asthma    Barrett esophagus    Benign hematuria 08/29/2014   W/u neg  Normal renal ultrasound 2/19  Cystoscopy normal 2019, [place]  CT -9/24     Bilateral carotid artery disease (HCC)    a.) carotid dopplers 10/13/2016, 10/24/2018, 10/26/2019, 10/21/2020, 10/22/2022: 1-39% BICA   C. difficile diarrhea    CAD (coronary artery disease)    a.) CT chest 07/12/2018: LAD calcifications; b.) CT CAP 11/18/2022: 3 vessel CAD; c.) cPET 05/19/2023: severe 4 vessel coronary calcs mainly in LAD distribution; d.) cPET 05/03/2023: small/mild reversible defect in mid-anterosep/ant region   Complication of anesthesia    a.) delayed emergence; b.) emergence delirium; "I get really loopy after surgery"   COPD (chronic obstructive pulmonary disease) (HCC)    DDD  (degenerative disc disease), lumbosacral    Depression    Diverticulosis    Epigastric hernia    GERD (gastroesophageal reflux disease)    Glaucoma    Heart murmur    Heart palpitations    History of bilateral cataract extraction 2015   History of recurrent UTIs    Hx of adenomatous colonic polyps    Hyperlipidemia    IBS (irritable bowel syndrome)    Insomnia    Iron  deficiency anemia    Kidney cysts    Long term current use of aspirin     Macular degeneration of left eye    Osteoporosis    a.) on RANKLi (denosumab )   PVD (peripheral vascular disease) with claudication (HCC)    Small bowel arteriovenous malformation 2007   a.) noted on VCE in 2007   Type 2 diabetes, diet controlled (HCC)    Wears dentures    partial lower    Past Surgical History:  Procedure Laterality Date   ABDOMINAL AORTIC ENDOVASCULAR STENT GRAFT N/A 08/24/2023   Procedure: ABDOMINAL AORTIC ENDOVASCULAR STENT GRAFT;  Surgeon: Celso College, MD;  Location: [facility] ORS;  Service: Vascular;  Laterality: N/A;   BUNIONECTOMY Right    CATARACT EXTRACTION Bilateral    CERVICAL CONE BIOPSY     CHOLECYSTECTOMY     COLONOSCOPY     COLONOSCOPY WITH PROPOFOL  N/A 01/22/2019   Procedure: COLONOSCOPY WITH BIOPSY;  Surgeon: Marnee Sink, MD;  Location: [facility];  Service: Endoscopy;  Laterality: N/A;  diabetic - diet controlled   COLONOSCOPY WITH PROPOFOL  N/A 06/27/2020   Procedure: COLONOSCOPY WITH BIOPSY;  Surgeon: Marnee Sink, MD;  Location: [facility];  Service: Endoscopy;  Laterality: N/A;  priority 4   ENDARTERECTOMY FEMORAL Bilateral 08/24/2023   Procedure: ENDARTERECTOMY FEMORAL;  Surgeon: Celso College, MD;  Location: [facility] ORS;  Service: Vascular;  Laterality: Bilateral;   ESOPHAGOGASTRODUODENOSCOPY (EGD) WITH PROPOFOL  N/A 08/29/2017   Procedure: ESOPHAGOGASTRODUODENOSCOPY (EGD) WITH PROPOFOL ;  Surgeon: Marnee Sink, MD;  Location: [facility];  Service: Endoscopy;  Laterality: N/A;    ESOPHAGOGASTRODUODENOSCOPY (EGD) WITH PROPOFOL  N/A 09/16/2023   Procedure: ESOPHAGOGASTRODUODENOSCOPY (EGD) WITH PROPOFOL ;  Surgeon: Marnee Sink, MD;  Location: [facility] ENDOSCOPY;  Service: Endoscopy;  Laterality: N/A;   FOOT SURGERY     right 2nd toe   HIP ARTHROPLASTY Left 05/16/2018   Procedure: ARTHROPLASTY BIPOLAR HIP (HEMIARTHROPLASTY);  Surgeon: Elner Hahn, MD;  Location: [facility] ORS;  Service: Orthopedics;  Laterality: Left;   HOT HEMOSTASIS  09/16/2023   Procedure: HOT HEMOSTASIS (ARGON PLASMA COAGULATION/BICAP);  Surgeon: Marnee Sink, MD;  Location: [facility] ENDOSCOPY;  Service: Endoscopy;;   INSERTION OF ILIAC STENT Bilateral 08/24/2023   Procedure: INSERTION OF ILIAC STENT;  Surgeon: Celso College, MD;  Location: [facility] ORS;  Service: Vascular;  Laterality: Bilateral;   LOWER EXTREMITY ANGIOGRAPHY Left 04/14/2023   Procedure: Lower Extremity Angiography;  Surgeon: Celso College, MD;  Location: [facility] INVASIVE CV LAB;  Service: Cardiovascular;  Laterality: Left;   ORIF PERIPROSTHETIC FRACTURE Left 12/10/2021   Procedure: OPEN REDUCTION INTERNAL FIXATION OF A LEFT GREATER TROCHANTERIC FRACTURE;  Surgeon: Elner Hahn, MD;  Location: [facility] ORS;  Service: Orthopedics;  Laterality: Left;   ORIF WRIST FRACTURE Right 10/07/2020   Procedure: OPEN REDUCTION INTERNAL FIXATION (ORIF) WRIST FRACTURE;  Surgeon: Molli Angelucci, MD;  Location: [facility] ORS;  Service: Orthopedics;  Laterality: Right;   POLYPECTOMY N/A 01/22/2019   Procedure: POLYPECTOMY;  Surgeon: Marnee Sink, MD;  Location: [facility];  Service: Endoscopy;  Laterality: N/A;  2 Clips placed at polyp removal site in ascending colon   POLYPECTOMY N/A 06/27/2020   Procedure: POLYPECTOMY;  Surgeon: Marnee Sink, MD;  Location: [facility];  Service: Endoscopy;  Laterality: N/A;   TUBAL LIGATION     UPPER GASTROINTESTINAL ENDOSCOPY      Allergies: Codeine, Hydrocodone, Hydrocodone-acetaminophen , Lidocaine , Nsaids, and  Oxycodone   Medications: Prior to Admission medications   Medication Sig Start Date End Date Taking? Authorizing Provider  acetaminophen  (TYLENOL ) 500 MG tablet Take 1,000 mg by mouth every 6 (six) hours as needed.    [provider]  aspirin  EC 81 MG tablet Take 1 tablet (81 mg total) by mouth daily. Swallow whole. 04/14/23 04/13/24  Celso College, MD  buPROPion  (WELLBUTRIN ) 75 MG tablet Take 75 mg by mouth every morning. 09/15/23 09/14/24  [provider]  Cholecalciferol  125 MCG (5000 UT) TABS Take 1 tablet by mouth daily.    [provider]  clopidogrel  (PLAVIX ) 75 MG tablet Take 1 tablet (75 mg total) by mouth daily. 09/03/23   Pace, Valentin Gaskins, NP  cyanocobalamin  1000 MCG tablet Take 1,000 mcg by mouth daily.    [provider]  denosumab  (PROLIA ) 60 MG/ML SOSY injection Inject 60 mg into the skin every 6 (six) months. 08/10/22   [provider]  donepezil  (ARICEPT ) 5 MG tablet Take 1 tablet by mouth at bedtime. 09/14/23 09/13/24  [provider]  Ferrous Gluconate  239 (27 Fe) MG TABS Take 1  tablet by mouth daily.    [provider]  fluticasone  (FLONASE ) 50 MCG/ACT nasal spray Place 1 spray into both nostrils daily as needed for allergies or rhinitis.    [provider]  loperamide  (IMODIUM ) 2 MG capsule Take 2 mg by mouth every morning.    [provider]  metoprolol  succinate (TOPROL -XL) 25 MG 24 hr tablet Take 1 tablet by mouth daily. 04/26/23 04/25/24  [provider]  pantoprazole  (PROTONIX ) 40 MG tablet Take 40 mg by mouth daily. 09/13/23 09/12/24  [provider]  rosuvastatin  (CRESTOR ) 20 MG tablet Take 20 mg by mouth daily.    [provider]     Family History  Problem Relation Age of Onset   Diabetes Mother    Heart disease Mother    Hypertension Mother    Diabetes Son    Diabetes Sister    Inflammatory bowel disease Father        diverticulitis   Multiple myeloma Father    Hypertension  Son    Diabetes Maternal Grandmother    Colon cancer Neg Hx    Esophageal cancer Neg Hx    Rectal cancer Neg Hx    Stomach cancer Neg Hx    Breast cancer Neg Hx     Social History   Socioeconomic History   Marital status: Divorced    Spouse name: Not on file   Number of children: 2   Years of education: Not on file   Highest education level: Not on file  Occupational History   Occupation: Retired  Tobacco Use   Smoking status: Every Day    Current packs/day: 0.50    Average packs/day: 0.5 packs/day for 55.0 years (27.5 ttl pk-yrs)    Types: Cigarettes   Smokeless tobacco: Never   Tobacco comments:    she has patches to start just not started yet. smoked since teenager.  Vaping Use   Vaping status: Never Used  Substance and Sexual Activity   Alcohol use: Yes    Alcohol/week: 2.0 standard drinks of alcohol    Types: 2 Glasses of wine per week    Comment: occ wine 1-2 weekly   Drug use: No   Sexual activity: Not on file  Other Topics Concern   Not on file  Social History Narrative   Independent at baseline.  Lives at home alone with two adult children .    Social Drivers of Corporate investment banker Strain: Low Risk  (12/01/2023)   Received from [facility]   Overall Financial Resource Strain (CARDIA)    Difficulty of Paying Living Expenses: Not hard at all  Food Insecurity: No Food Insecurity (12/01/2023)   Received [facility]   Hunger Vital Sign    Worried About Running Out of Food in the Last Year: Never true    Ran Out of Food in the Last Year: Never true  Transportation Needs: No Transportation Needs (12/01/2023)   Received from [facility] - Transportation    In the past 12 months, has lack of transportation kept you from medical appointments or from getting medications?: No    Lack of Transportation (Non-Medical): No  Physical Activity: Not on file  Stress: Not on file  Social  Connections: Moderately Isolated (09/16/2023)   Social Connection and Isolation Panel [NHANES]    Frequency of Communication with Friends and Family: More than three times a week    Frequency of Social  Gatherings with Friends and Family: More than three times a week    Attends Religious Services: More than 4 times per year    Active Member of Clubs or Organizations: No    Attends Banker Meetings: Never    Marital Status: Widowed     Review of Systems: A 12 point ROS discussed and pertinent positives are indicated in the HPI above.  All other systems are negative.  Review of Systems  Neurological:  Positive for dizziness (occasional with standing up, onging for a while).    Vital Signs: BP (!) 142/73   Pulse (!) 53   Temp 98 F (36.7 C) (Oral)   Resp 17   Ht 5\' 1"  (1.549 m)   Wt 95 lb 11.2 oz (43.4 kg)   SpO2 98%   BMI 18.08 kg/m   Advance Care Plan: No documents on file  Physical Exam Vitals and nursing note reviewed.  Constitutional:      Appearance: Normal appearance.  HENT:     Mouth/Throat:     Mouth: Mucous membranes are moist.     Pharynx: Oropharynx is clear.  Cardiovascular:     Rate and Rhythm: Regular rhythm. Bradycardia present.  Pulmonary:     Effort: Pulmonary effort is normal.     Breath sounds: Normal breath sounds.  Abdominal:     Palpations: Abdomen is soft.     Tenderness: There is no abdominal tenderness.  Musculoskeletal:     Right lower leg: No edema.     Left lower leg: No edema.  Skin:    General: Skin is warm and dry.  Neurological:     Mental Status: She is alert and oriented to person, place, and time. Mental status is at baseline.     Imaging: VAS US  ABI WITH/WO TBI Result Date: 11/14/2023  LOWER EXTREMITY DOPPLER STUDY Patient Name:  MAMTA AUGUST  Date of Exam:   11/11/2023 Medical Rec #: [id number]       Accession #:    [id number] Date of Birth: 17-Nov-1945       Patient Gender: F Patient Age:   45 years Exam Location:   [facility] Procedure:      VAS US  ABI WITH/WO TBI Referring Phys: --------------------------------------------------------------------------------  Indications: Claudication, and peripheral artery disease. High Risk Factors: Hyperlipidemia.  Vascular Interventions: 08/23/2022 Left common femoral, profunda femoris, and                         superficial femoral artery endarterectomies                         2. Right common femoral, profunda femoris, and                         superficial femoral artery endarterectomies                         3. Aortogram and iliofemoral angiograms                         4. Use of the shockwave device for angioplasty and                         lithotripsy of the left common iliac artery with a 6 mm  diameter by 6 cm length device                         5. Use of the shockwave device for angioplasty and                         lithotripsy of the right common iliac artery using an 8                         mm diameter by 6 cm length device                         6. Placement of an Endologix aortic stent graft with 20                         mm proximal and 13 mm distal stents for treatment of                         occlusive disease.                         7. Lifestream balloon expandable stent placement into                         the right common iliac artery with 10 mm diameter by 38                         mm length Lifestream stent                         8. Lifestream balloon expandable stent placement of the                         left common iliac artery with 9 mm diameter by 58 mm                         length Lifestream stent. Comparison Study: 09/2023 Performing Technologist: Faustine Hoof RVT  Examination Guidelines: A complete evaluation includes at minimum, Doppler waveform signals and systolic blood pressure reading at the level of bilateral brachial, anterior tibial, and posterior tibial arteries, when vessel segments are  accessible. Bilateral testing is considered an integral part of a complete examination. Photoelectric Plethysmograph (PPG) waveforms and toe systolic pressure readings are included as required and additional duplex testing as needed. Limited examinations for reoccurring indications may be performed as noted.  ABI Findings: +---------+------------------+-----+---------+--------+ Right    Rt Pressure (mmHg)IndexWaveform Comment  +---------+------------------+-----+---------+--------+ Brachial 110                                      +---------+------------------+-----+---------+--------+ PTA      122               1.06 triphasic         +---------+------------------+-----+---------+--------+ DP       136               1.18 triphasic         +---------+------------------+-----+---------+--------+ Aminta Kales  0.39 Abnormal          +---------+------------------+-----+---------+--------+ +---------+------------------+-----+---------+-------+ Left     Lt Pressure (mmHg)IndexWaveform Comment +---------+------------------+-----+---------+-------+ Brachial 115                                     +---------+------------------+-----+---------+-------+ PTA      126               1.10 triphasic        +---------+------------------+-----+---------+-------+ DP       135               1.17 triphasic        +---------+------------------+-----+---------+-------+ Great Toe85                0.74 Abnormal         +---------+------------------+-----+---------+-------+ +-------+-----------+-----------+------------+------------+ ABI/TBIToday's ABIToday's TBIPrevious ABIPrevious TBI +-------+-----------+-----------+------------+------------+ Right  1.18       .39        1.14        .39          +-------+-----------+-----------+------------+------------+ Left   1.17       .74        1.11        .77           +-------+-----------+-----------+------------+------------+  Summary: Right: Resting right ankle-brachial index is within normal range. The right toe-brachial index is abnormal. Left: Resting left ankle-brachial index is within normal range. The left toe-brachial index is normal. *See table(s) above for measurements and observations.  Electronically signed by Mikki Alexander MD on 11/14/2023 at 3:40:40 PM.    Final     Labs:  CBC: Recent Labs    10/25/23 1259 11/02/23 0851 11/28/23 1319 12/09/23 0747  WBC 9.4 7.0 6.0 6.1  HGB 7.5* 7.3* 8.6* 9.5*  HCT 25.9* 25.0* 28.6* 31.9*  PLT 396 433* 357 451*    COAGS: Recent Labs    09/16/23 0450  INR 1.2  APTT 26    BMP: Recent Labs    08/28/23 1221 08/31/23 0558 09/15/23 1540 09/16/23 0450 09/17/23 0459  NA 133*  --  136 138 140  K 3.4*  --  4.1 4.1 4.1  CL 99  --  104 108 109  CO2 23  --  18* 20* 24  GLUCOSE 141*  --  138* 96 110*  BUN 14  --  28* 24* 15  CALCIUM  8.3*  --  8.7* 8.1* 8.4*  CREATININE 0.58 0.66 0.84 0.69 0.70  GFRNONAA >60 >60 >60 >60 >60    LIVER FUNCTION TESTS: Recent Labs    08/28/23 1221 09/17/23 0459 11/02/23 0851  BILITOT 0.7 0.4 0.5  AST 20 15  --   ALT 10 9  --   ALKPHOS 49 37*  --   PROT 5.4* 5.3*  --   ALBUMIN  3.0* 3.1*  --     TUMOR MARKERS: No results for input(s): "AFPTM", "CEA", "CA199", "CHROMGRNA" in the last 8760 hours.  Assessment and Plan:  GEETHA HORNICK is a 78 y.o. female with pmhx of Afib, arthritis, asthma, AVM, barrett's esophagus, carotid stenosis, COPD, DM, GERD. Presenting to IR service for CT bone marrow biopsy. Has been following with Dr. Aris Bel with heme/onc for chronic anemia. Recently has been found to have macrocytosis refractory to iron  infusions with no other evident etiology. Pt now referred to Ir for bone marrow biopsy to rule out other etiology, such as  MDS.  Risks and benefits of CT bone marrow biopsy was discussed with the patient and/or patient's family  including, but not limited to bleeding, infection, damage to adjacent structures or low yield requiring additional tests.  All of the questions were answered and there is agreement to proceed.  Consent signed and in chart.   Thank you for allowing our service to participate in Yolanda Jimenez 's care.  Electronically Signed: Nicolasa Barrett, PA-C   12/09/2023, 8:39 AM      I spent a total of  30 Minutes   in face to face in clinical consultation, greater than 50% of which was counseling/coordinating care for CT bone marrow biopsy

## 2023-12-08 NOTE — Progress Notes (Signed)
 Patient for IR Bone Marrow Biopsy on Friday 12/09/23, I called and spoke with the patient on the phone and gave pre-procedure instructions. Pt was made aware to be here at 7:30a, NPO after MN prior to procedure as well as driver post procedure/recovery/discharge. Pt stated understanding.  Called 12/08/23

## 2023-12-09 ENCOUNTER — Ambulatory Visit
Admission: RE | Admit: 2023-12-09 | Discharge: 2023-12-09 | Disposition: A | Discharge status: Home / Self Care | Source: Ambulatory Visit | Attending: Internal Medicine | Admitting: Internal Medicine

## 2023-12-09 ENCOUNTER — Encounter: Payer: Self-pay | Admitting: Radiology

## 2023-12-09 ENCOUNTER — Other Ambulatory Visit: Payer: Self-pay

## 2023-12-09 VITALS — BP 117/67 | HR 53 | Temp 98.4°F | Resp 14 | Ht 61.0 in | Wt 95.7 lb

## 2023-12-09 DIAGNOSIS — J4489 Other specified chronic obstructive pulmonary disease: Secondary | ICD-10-CM | POA: Diagnosis not present

## 2023-12-09 DIAGNOSIS — Z1379 Encounter for other screening for genetic and chromosomal anomalies: Secondary | ICD-10-CM | POA: Insufficient documentation

## 2023-12-09 DIAGNOSIS — I6529 Occlusion and stenosis of unspecified carotid artery: Secondary | ICD-10-CM | POA: Insufficient documentation

## 2023-12-09 DIAGNOSIS — K219 Gastro-esophageal reflux disease without esophagitis: Secondary | ICD-10-CM | POA: Insufficient documentation

## 2023-12-09 DIAGNOSIS — D649 Anemia, unspecified: Secondary | ICD-10-CM | POA: Insufficient documentation

## 2023-12-09 DIAGNOSIS — I4891 Unspecified atrial fibrillation: Secondary | ICD-10-CM | POA: Insufficient documentation

## 2023-12-09 DIAGNOSIS — D75839 Thrombocytosis, unspecified: Secondary | ICD-10-CM | POA: Diagnosis not present

## 2023-12-09 DIAGNOSIS — D539 Nutritional anemia, unspecified: Secondary | ICD-10-CM

## 2023-12-09 DIAGNOSIS — F17210 Nicotine dependence, cigarettes, uncomplicated: Secondary | ICD-10-CM | POA: Diagnosis not present

## 2023-12-09 DIAGNOSIS — E1151 Type 2 diabetes mellitus with diabetic peripheral angiopathy without gangrene: Secondary | ICD-10-CM | POA: Diagnosis not present

## 2023-12-09 DIAGNOSIS — Z79899 Other long term (current) drug therapy: Secondary | ICD-10-CM | POA: Insufficient documentation

## 2023-12-09 HISTORY — PX: IR BONE MARROW BIOPSY & ASPIRATION: IMG5727

## 2023-12-09 LAB — CBC WITH DIFFERENTIAL/PLATELET
Abs Immature Granulocytes: 0.03 10*3/uL (ref 0.00–0.07)
Basophils Absolute: 0.1 10*3/uL (ref 0.0–0.1)
Basophils Relative: 1 %
Eosinophils Absolute: 0.2 10*3/uL (ref 0.0–0.5)
Eosinophils Relative: 4 %
HCT: 31.9 % — ABNORMAL LOW (ref 36.0–46.0)
Hemoglobin: 9.5 g/dL — ABNORMAL LOW (ref 12.0–15.0)
Immature Granulocytes: 1 %
Lymphocytes Relative: 20 %
Lymphs Abs: 1.2 10*3/uL (ref 0.7–4.0)
MCH: 32 pg (ref 26.0–34.0)
MCHC: 29.8 g/dL — ABNORMAL LOW (ref 30.0–36.0)
MCV: 107.4 fL — ABNORMAL HIGH (ref 80.0–100.0)
Monocytes Absolute: 0.8 10*3/uL (ref 0.1–1.0)
Monocytes Relative: 13 %
Neutro Abs: 3.8 10*3/uL (ref 1.7–7.7)
Neutrophils Relative %: 61 %
Platelets: 451 10*3/uL — ABNORMAL HIGH (ref 150–400)
RBC: 2.97 MIL/uL — ABNORMAL LOW (ref 3.87–5.11)
RDW: 16.6 % — ABNORMAL HIGH (ref 11.5–15.5)
WBC: 6.1 10*3/uL (ref 4.0–10.5)
nRBC: 0 % (ref 0.0–0.2)

## 2023-12-09 MED ORDER — HEPARIN SOD (PORK) LOCK FLUSH 100 UNIT/ML IV SOLN
INTRAVENOUS | Status: AC
  Filled 2023-12-09: qty 5

## 2023-12-09 MED ORDER — MIDAZOLAM HCL 2 MG/2ML IJ SOLN
INTRAMUSCULAR | Status: AC
  Filled 2023-12-09: qty 2

## 2023-12-09 MED ORDER — FENTANYL CITRATE (PF) 100 MCG/2ML IJ SOLN
INTRAMUSCULAR | Status: AC
  Filled 2023-12-09: qty 2

## 2023-12-09 MED ORDER — LIDOCAINE 1 % OPTIME INJ - NO CHARGE
10.0000 mL | Freq: Once | INTRAMUSCULAR | Status: AC
  Administered 2023-12-09: 10 mL via INTRADERMAL
  Filled 2023-12-09: qty 10

## 2023-12-09 MED ORDER — MIDAZOLAM HCL 5 MG/5ML IJ SOLN
INTRAMUSCULAR | Status: AC | PRN
  Administered 2023-12-09: .5 mg via INTRAVENOUS

## 2023-12-09 MED ORDER — FENTANYL CITRATE (PF) 100 MCG/2ML IJ SOLN
INTRAMUSCULAR | Status: AC | PRN
  Administered 2023-12-09: 25 ug via INTRAVENOUS

## 2023-12-09 MED ORDER — SODIUM CHLORIDE 0.9 % IV SOLN: INTRAVENOUS | Status: DC

## 2023-12-09 NOTE — Progress Notes (Signed)
 Patient clinically stable post IR BMB per Dr Mabel Savage, tolerated well. Vitals stable pre and post procedure. Received Versed  0.5 mg along with Fentanyl  25 mcg IV for procedure. Report given to [facility] RN post procedure/specials/15.

## 2023-12-09 NOTE — Procedures (Signed)
Interventional Radiology Procedure Note  Procedure: Image guided aspirate and core biopsy of left posterior iliac bone Complications: None Recommendations: - Bedrest supine x 1 hrs - OTC's PRN  Pain - Follow biopsy results  Signed,  Dulcy Fanny. Earleen Newport, DO

## 2023-12-16 ENCOUNTER — Inpatient Hospital Stay: Admitting: Internal Medicine

## 2023-12-16 ENCOUNTER — Encounter: Payer: Self-pay | Admitting: Internal Medicine

## 2023-12-16 ENCOUNTER — Telehealth: Payer: Self-pay | Admitting: *Deleted

## 2023-12-16 ENCOUNTER — Inpatient Hospital Stay: Attending: Internal Medicine

## 2023-12-16 DIAGNOSIS — Z807 Family history of other malignant neoplasms of lymphoid, hematopoietic and related tissues: Secondary | ICD-10-CM | POA: Insufficient documentation

## 2023-12-16 DIAGNOSIS — D509 Iron deficiency anemia, unspecified: Secondary | ICD-10-CM | POA: Insufficient documentation

## 2023-12-16 DIAGNOSIS — K922 Gastrointestinal hemorrhage, unspecified: Secondary | ICD-10-CM | POA: Insufficient documentation

## 2023-12-16 DIAGNOSIS — F17210 Nicotine dependence, cigarettes, uncomplicated: Secondary | ICD-10-CM | POA: Insufficient documentation

## 2023-12-16 LAB — SURGICAL PATHOLOGY

## 2023-12-16 NOTE — Telephone Encounter (Signed)
 pt' bone marrow bx results are not yet back. I called the pt and made her aware. pt was very sad that she couldn't see Dr. Alana Hoyle on her last day. I provided reassurance. I didn't want her to come all the way to the [facility]ct[facility] today if we didn't have results.  Msg sent to scheduling to see if we can schedule pt w/one of the other hematologist next week. pt doesnt have a preference on the provider taking over her care. I called [facility] path department last evening. I was informed at that time that the case was not yet signed off on but the results should be back.  pt knows that if anything would change and the results come back before 2pm. I will call her back. Otherwise, pt's apt will be rescheduled.  Pt thanked me for calling her and updating her.

## 2023-12-19 ENCOUNTER — Encounter (HOSPITAL_COMMUNITY): Payer: Self-pay | Admitting: Internal Medicine

## 2023-12-19 ENCOUNTER — Encounter: Payer: Self-pay | Admitting: Internal Medicine

## 2023-12-21 ENCOUNTER — Encounter: Payer: Self-pay | Admitting: Oncology

## 2023-12-21 ENCOUNTER — Inpatient Hospital Stay

## 2023-12-21 ENCOUNTER — Inpatient Hospital Stay (HOSPITAL_BASED_OUTPATIENT_CLINIC_OR_DEPARTMENT_OTHER): Admitting: Oncology

## 2023-12-21 ENCOUNTER — Other Ambulatory Visit: Payer: Self-pay | Admitting: *Deleted

## 2023-12-21 VITALS — BP 111/60 | HR 50 | Temp 96.6°F | Resp 16 | Ht 61.0 in | Wt 97.0 lb

## 2023-12-21 DIAGNOSIS — D500 Iron deficiency anemia secondary to blood loss (chronic): Secondary | ICD-10-CM

## 2023-12-21 DIAGNOSIS — K922 Gastrointestinal hemorrhage, unspecified: Secondary | ICD-10-CM | POA: Diagnosis not present

## 2023-12-21 DIAGNOSIS — D509 Iron deficiency anemia, unspecified: Secondary | ICD-10-CM | POA: Diagnosis present

## 2023-12-21 DIAGNOSIS — Z807 Family history of other malignant neoplasms of lymphoid, hematopoietic and related tissues: Secondary | ICD-10-CM | POA: Diagnosis not present

## 2023-12-21 DIAGNOSIS — F17210 Nicotine dependence, cigarettes, uncomplicated: Secondary | ICD-10-CM | POA: Diagnosis not present

## 2023-12-21 LAB — CBC WITH DIFFERENTIAL (CANCER CENTER ONLY)
Abs Immature Granulocytes: 0.04 10*3/uL (ref 0.00–0.07)
Basophils Absolute: 0.1 10*3/uL (ref 0.0–0.1)
Basophils Relative: 1 %
Eosinophils Absolute: 0.2 10*3/uL (ref 0.0–0.5)
Eosinophils Relative: 2 %
HCT: 27 % — ABNORMAL LOW (ref 36.0–46.0)
Hemoglobin: 8.2 g/dL — ABNORMAL LOW (ref 12.0–15.0)
Immature Granulocytes: 0 %
Lymphocytes Relative: 5 %
Lymphs Abs: 0.4 10*3/uL — ABNORMAL LOW (ref 0.7–4.0)
MCH: 30.8 pg (ref 26.0–34.0)
MCHC: 30.4 g/dL (ref 30.0–36.0)
MCV: 101.5 fL — ABNORMAL HIGH (ref 80.0–100.0)
Monocytes Absolute: 0.7 10*3/uL (ref 0.1–1.0)
Monocytes Relative: 8 %
Neutro Abs: 7.7 10*3/uL (ref 1.7–7.7)
Neutrophils Relative %: 84 %
Platelet Count: 436 10*3/uL — ABNORMAL HIGH (ref 150–400)
RBC: 2.66 MIL/uL — ABNORMAL LOW (ref 3.87–5.11)
RDW: 16.3 % — ABNORMAL HIGH (ref 11.5–15.5)
WBC Count: 9.1 10*3/uL (ref 4.0–10.5)
nRBC: 0 % (ref 0.0–0.2)

## 2023-12-21 LAB — IRON AND TIBC
Iron: 27 ug/dL — ABNORMAL LOW (ref 28–170)
Saturation Ratios: 7 % — ABNORMAL LOW (ref 10.4–31.8)
TIBC: 388 ug/dL (ref 250–450)
UIBC: 361 ug/dL

## 2023-12-21 LAB — PREPARE RBC (CROSSMATCH)

## 2023-12-21 LAB — FERRITIN: Ferritin: 47 ng/mL (ref 11–307)

## 2023-12-21 NOTE — Progress Notes (Signed)
 [facility]  [phone] [phone]) [phone]  ID: Bernice Bring OB: 11-Jul-1946  MR#: [id number]  HKV#:[id number]  Patient Care Team: Sari Cunning, MD as PCP - General (Unknown Physician Specialty)  CHIEF COMPLAINT: Iron  deficiency anemia.  INTERVAL HISTORY: Patient returns to clinic today for repeat laboratory work, further evaluation, discussion of her bone marrow biopsy results.  She continues to feel well and remains asymptomatic.  She has no neurologic complaints.  She denies any recent fevers or illnesses.  She has a good appetite and denies weight loss.  She has no chest pain, shortness of breath, cough, or hemoptysis.  She denies any nausea, vomiting, constipation, or diarrhea.  She has noted no melena or hematochezia recently.  She has no urinary complaints.  Patient offers no further specific complaints today.  REVIEW OF SYSTEMS:   Review of Systems  Constitutional: Negative.  Negative for fever, malaise/fatigue and weight loss.  Respiratory: Negative.  Negative for cough, hemoptysis and shortness of breath.   Cardiovascular: Negative.  Negative for chest pain and leg swelling.  Gastrointestinal: Negative.  Negative for abdominal pain, blood in stool and melena.  Genitourinary: Negative.  Negative for dysuria.  Musculoskeletal: Negative.  Negative for back pain.  Skin: Negative.  Negative for rash.  Neurological: Negative.  Negative for dizziness, focal weakness, weakness and headaches.  Psychiatric/Behavioral: Negative.  The patient is not nervous/anxious.     As per HPI. Otherwise, a complete review of systems is negative.  PAST MEDICAL HISTORY: Past Medical History:  Diagnosis Date   A-fib [facility])    a.) CHA2DS2-VASc = 5 (age x2, sex, vascular disease history, T2DM) as of 08/23/2023; b.) cardiac rate/rhythm maintained on oral metoprolol  succinate; no chronic OAC   Abnormal vaginal Pap smear 2015   Anxiety    a.) on BZO PRN (diazepam )    Aortic atherosclerosis (HCC)    Aortic stenosis 05/03/2023   a.) TTE 05/03/2023: mild AS (MPG 12 mmHg; AVA = 1.3 cm2)   Arthritis of spine    Asthma    Barrett esophagus    Benign hematuria 08/29/2014   W/u neg  Normal renal ultrasound 2/19  Cystoscopy normal 2019, [place]  CT -9/24     Bilateral carotid artery disease (HCC)    a.) carotid dopplers 10/13/2016, 10/24/2018, 10/26/2019, 10/21/2020, 10/22/2022: 1-39% BICA   C. difficile diarrhea    CAD (coronary artery disease)    a.) CT chest 07/12/2018: LAD calcifications; b.) CT CAP 11/18/2022: 3 vessel CAD; c.) cPET 05/19/2023: severe 4 vessel coronary calcs mainly in LAD distribution; d.) cPET 05/03/2023: small/mild reversible defect in mid-anterosep/ant region   Complication of anesthesia    a.) delayed emergence; b.) emergence delirium; "I get really loopy after surgery"   COPD (chronic obstructive pulmonary disease) (HCC)    DDD (degenerative disc disease), lumbosacral    Depression    Diverticulosis    Epigastric hernia    GERD (gastroesophageal reflux disease)    Glaucoma    Heart murmur    Heart palpitations    History of bilateral cataract extraction 2015   History of recurrent UTIs    Hx of adenomatous colonic polyps    Hyperlipidemia    IBS (irritable bowel syndrome)    Insomnia    Iron  deficiency anemia    Kidney cysts    Long term current use of aspirin     Macular degeneration of left eye    Osteoporosis    a.) on RANKLi (denosumab )   PVD (peripheral  vascular disease) with claudication (HCC)    Small bowel arteriovenous malformation 2007   a.) noted on VCE in 2007   Type 2 diabetes, diet controlled (HCC)    Wears dentures    partial lower    PAST SURGICAL HISTORY: Past Surgical History:  Procedure Laterality Date   ABDOMINAL AORTIC ENDOVASCULAR STENT GRAFT N/A 08/24/2023   Procedure: ABDOMINAL AORTIC ENDOVASCULAR STENT GRAFT;  Surgeon: Celso College, MD;  Location: [facility] OR[facility];  Service: Vascular;  Laterality:  N/A;   BUNIONECTOMY Right    CATARACT EXTRACTION Bilateral    CERVICAL CONE BIOPSY     CHOLECYSTECTOMY     COLONOSCOPY     COLONOSCOPY WITH PROPOFOL  N/A 01/22/2019   Procedure: COLONOSCOPY WITH BIOPSY;  Surgeon: Marnee Sink, MD;  Location: [facility];  Service: Endoscopy;  Laterality: N/A;  diabetic - diet controlled   COLONOSCOPY WITH PROPOFOL  N/A 06/27/2020   Procedure: COLONOSCOPY WITH BIOPSY;  Surgeon: Marnee Sink, MD;  Location: [facility];  Service: Endoscopy;  Laterality: N/A;  priority 4   ENDARTERECTOMY FEMORAL Bilateral 08/24/2023   Procedure: ENDARTERECTOMY FEMORAL;  Surgeon: Celso College, MD;  Location: [facility];  Service: Vascular;  Laterality: Bilateral;   ESOPHAGOGASTRODUODENOSCOPY (EGD) WITH PROPOFOL  N/A 08/29/2017   Procedure: ESOPHAGOGASTRODUODENOSCOPY (EGD) WITH PROPOFOL ;  Surgeon: Marnee Sink, MD;  Location: [facility];  Service: Endoscopy;  Laterality: N/A;   ESOPHAGOGASTRODUODENOSCOPY (EGD) WITH PROPOFOL  N/A 09/16/2023   Procedure: ESOPHAGOGASTRODUODENOSCOPY (EGD) WITH PROPOFOL ;  Surgeon: Marnee Sink, MD;  Location: [facility];  Service: Endoscopy;  Laterality: N/A;   FOOT SURGERY     right 2nd toe   HIP ARTHROPLASTY Left 05/16/2018   Procedure: ARTHROPLASTY BIPOLAR HIP (HEMIARTHROPLASTY);  Surgeon: Elner Hahn, MD;  Location: [facility];  Service: Orthopedics;  Laterality: Left;   HOT HEMOSTASIS  09/16/2023   Procedure: HOT HEMOSTASIS (ARGON PLASMA COAGULATION/BICAP);  Surgeon: Marnee Sink, MD;  Location: [facility] END[facility];  Service: Endoscopy;;   INSERTION OF ILIAC STENT Bilateral 08/24/2023   Procedure: INSERTION OF ILIAC STENT;  Surgeon: Celso College, MD;  Location: [facility];  Service: Vascular;  Laterality: Bilateral;   IR BONE MARROW BIOPSY & ASPIRATION  12/09/2023   LOWER EXTREMITY ANGIOGRAPHY Left 04/14/2023   Procedure: Lower Extremity Angiography;  Surgeon: Celso College, MD;  Location: [facility];  Service: Cardiovascular;   Laterality: Left;   ORIF PERIPROSTHETIC FRACTURE Left 12/10/2021   Procedure: OPEN REDUCTION INTERNAL FIXATION OF A LEFT GREATER TROCHANTERIC FRACTURE;  Surgeon: Elner Hahn, MD;  Location: [facility];  Service: Orthopedics;  Laterality: Left;   ORIF WRIST FRACTURE Right 10/07/2020   Procedure: OPEN REDUCTION INTERNAL FIXATION (ORIF) WRIST FRACTURE;  Surgeon: Molli Angelucci, MD;  Location: [facility];  Service: Orthopedics;  Laterality: Right;   POLYPECTOMY N/A 01/22/2019   Procedure: POLYPECTOMY;  Surgeon: Marnee Sink, MD;  Location: [facility];  Service: Endoscopy;  Laterality: N/A;  2 Clips placed at polyp removal site in ascending colon   POLYPECTOMY N/A 06/27/2020   Procedure: POLYPECTOMY;  Surgeon: Marnee Sink, MD;  Location: [facility];  Service: Endoscopy;  Laterality: N/A;   TUBAL LIGATION     UPPER GASTROINTESTINAL ENDOSCOPY      FAMILY HISTORY: Family History  Problem Relation Age of Onset   Diabetes Mother    Heart disease Mother    Hypertension Mother    Diabetes Son    Diabetes Sister    Inflammatory bowel disease Father  diverticulitis   Multiple myeloma Father    Hypertension Son    Diabetes Maternal Grandmother    Colon cancer Neg Hx    Esophageal cancer Neg Hx    Rectal cancer Neg Hx    Stomach cancer Neg Hx    Breast cancer Neg Hx     ADVANCED DIRECTIVES (Y/N):  N  HEALTH MAINTENANCE: Social History   Tobacco Use   Smoking status: Every Day    Current packs/day: 0.50    Average packs/day: 0.5 packs/day for 55.0 years (27.5 ttl pk-yrs)    Types: Cigarettes   Smokeless tobacco: Never   Tobacco comments:    she has patches to start just not started yet. smoked since teenager.  Vaping Use   Vaping status: Never Used  Substance Use Topics   Alcohol use: Yes    Alcohol/week: 2.0 standard drinks of alcohol    Types: 2 Glasses of wine per week    Comment: occ wine 1-2 weekly   Drug use: No     Colonoscopy:  PAP:  Bone  density:  Lipid panel:  Allergies  Allergen Reactions   Codeine     "felt drunk, crazy"   Hydrocodone Other (See Comments)   Hydrocodone-Acetaminophen  Other (See Comments)    Confusion/delirium   Lidocaine      Heart racing, increased BP - from local at dentist (likely epi)   Nsaids Other (See Comments)    Avoids due to IBS symptoms (bleeding symptoms)   Oxycodone      Confusion/delirium    Current Outpatient Medications  Medication Sig Dispense Refill   acetaminophen  (TYLENOL ) 500 MG tablet Take 1,000 mg by mouth every 6 (six) hours as needed.     aspirin  EC 81 MG tablet Take 1 tablet (81 mg total) by mouth daily. Swallow whole. 150 tablet 2   buPROPion  (WELLBUTRIN ) 75 MG tablet Take 75 mg by mouth every morning.     Cholecalciferol  125 MCG (5000 UT) TABS Take 1 tablet by mouth daily.     clopidogrel  (PLAVIX ) 75 MG tablet Take 1 tablet (75 mg total) by mouth daily. 30 tablet 6   cyanocobalamin  1000 MCG tablet Take 1,000 mcg by mouth daily.     denosumab  (PROLIA ) 60 MG/ML SOSY injection Inject 60 mg into the skin every 6 (six) months.     donepezil  (ARICEPT ) 5 MG tablet Take 1 tablet by mouth at bedtime.     Ferrous Gluconate  239 (27 Fe) MG TABS Take 1 tablet by mouth daily.     fluticasone  (FLONASE ) 50 MCG/ACT nasal spray Place 1 spray into both nostrils daily as needed for allergies or rhinitis.     loperamide  (IMODIUM ) 2 MG capsule Take 2 mg by mouth every morning.     metoprolol  succinate (TOPROL -XL) 25 MG 24 hr tablet Take 1 tablet by mouth daily. Added 1/2 tablet at night as well     pantoprazole  (PROTONIX ) 40 MG tablet Take 40 mg by mouth daily.     rosuvastatin  (CRESTOR ) 20 MG tablet Take 20 mg by mouth daily.     No current facility-administered medications for this visit.    OBJECTIVE: Vitals:   12/21/23 0825  BP: 111/60  Pulse: (!) 50  Resp: 16  Temp: (!) 96.6 F (35.9 C)  SpO2: 100%     Body mass index is 18.33 kg/m.    ECOG FS:0 - Asymptomatic  General:  Well-developed, well-nourished, no acute distress. Eyes: Pink conjunctiva, anicteric sclera. HEENT: Normocephalic, moist mucous membranes. Lungs: No  audible wheezing or coughing. Heart: Regular rate and rhythm. Abdomen: Soft, nontender, no obvious distention. Musculoskeletal: No edema, cyanosis, or clubbing. Neuro: Alert, answering all questions appropriately. Cranial nerves grossly intact. Skin: No rashes or petechiae noted. Psych: Normal affect. Lymphatics: No cervical, calvicular, axillary or inguinal LAD.   LAB RESULTS:  Lab Results  Component Value Date   NA 140 09/17/2023   K 4.1 09/17/2023   CL 109 09/17/2023   CO2 24 09/17/2023   GLUCOSE 110 (H) 09/17/2023   BUN 15 09/17/2023   CREATININE 0.70 09/17/2023   CALCIUM  8.4 (L) 09/17/2023   PROT 5.3 (L) 09/17/2023   ALBUMIN  3.1 (L) 09/17/2023   AST 15 09/17/2023   ALT 9 09/17/2023   ALKPHOS 37 (L) 09/17/2023   BILITOT 0.5 11/02/2023   GFRNONAA >60 09/17/2023   GFRAA >60 05/18/2018    Lab Results  Component Value Date   WBC 9.1 12/21/2023   NEUTROABS 7.7 12/21/2023   HGB 8.2 (L) 12/21/2023   HCT 27.0 (L) 12/21/2023   MCV 101.5 (H) 12/21/2023   PLT 436 (H) 12/21/2023   Lab Results  Component Value Date   IRON  27 (L) 12/21/2023   TIBC 388 12/21/2023   IRONPCTSAT 7 (L) 12/21/2023   Lab Results  Component Value Date   FERRITIN 47 12/21/2023     STUDIES: IR BONE MARROW BIOPSY & ASPIRATION Result Date: 12/09/2023 INDICATION: 78 year old female referred for bone marrow biopsy EXAM: IR BONE MARRO BIOPY AND ASPIRATION MEDICATIONS: None. ANESTHESIA/SEDATION: Moderate (conscious) sedation was employed during this procedure. A total of Versed  0.5 mg and Fentanyl  20 mcg was administered intravenously. Moderate Sedation Time: 10 minutes. The patient's level of consciousness and vital signs were monitored continuously by radiology nursing throughout the procedure under my direct supervision. FLUOROSCOPY TIME:  Fluoroscopy  Time:  (1 mGy). COMPLICATIONS: None PROCEDURE: Informed written consent was obtained from the patient after a thorough discussion of the procedural risks, benefits and alternatives. All questions were addressed. Maximal Sterile Barrier Technique was utilized including caps, mask, sterile gowns, sterile gloves, sterile drape, hand hygiene and skin antiseptic. A timeout was performed prior to the initiation of the procedure. The procedure risks, benefits, and alternatives were explained to the patient. Questions regarding the procedure were encouraged and answered. The patient understands and consents to the procedure. Patient was positioned prone under the image intensifier. Physical exam was used to determine the L5- S1 level, and then the posterior pelvis was prepped with Chlorhexidine  in a sterile fashion. A sterile drape was applied covering the operative field. Sterile gown/sterile gloves were used for the procedure. Local anesthesia was provided with 1% Lidocaine . Posterior left iliac bone was targeted for biopsy. The skin and subcutaneous tissues were infiltrated with 1% lidocaine  without epinephrine . A small stab incision was made with an 11 blade scalpel, and an 11 gauge Murphy needle was advanced with fluoro guidance to the posterior cortex. Manual forced was used to advance the needle through the posterior cortex and the stylet was removed. A bone marrow aspirate was retrieved and passed to a cytotechnologist in the room. The Murphy needle was then advanced without the stylet for a core biopsy. The core biopsy was retrieved and also passed to a cytotechnologist. Manual pressure was used for hemostasis and a sterile dressing was placed. No complications were encountered no significant blood loss was encountered. Patient tolerated the procedure well and remained hemodynamically stable throughout. IMPRESSION: Status post image guided bone marrow biopsy. Signed, Marciano Settles. Mabel Savage, DO, ABVM, RPVI  [facility] Electronically Signed   By: Myrlene Asper D.O.   On: 12/09/2023 10:26    ASSESSMENT: Iron  deficiency anemia.  PLAN:    Iron  deficiency anemia: Likely secondary to GI bleed.  Patient underwent EGD on September 16, 2023 that revealed multiple angiodysplastic lesions with recent stigmata of bleeding throughout the entire duodenum.  These were subsequently treated with argon plasma coagulation.  Recommendation at that time was to repeat endoscopy in 6 months when patient was able to hold Plavix .  She also underwent bone marrow biopsy on Dec 09, 2023 that only revealed a hypercellular bone marrow with relative relative erythroid hyperplasia suggestive of compensatory hyperplasia.  Myeloid series was reported as unremarkable.  Cytogenetics was normal as well.  Patient's hemoglobin has decreased from 9.5-8.2 over the past 2 weeks therefore she will return to clinic tomorrow for 1 unit of packed red blood cells.  Patient will then return to clinic in 2 weeks for further evaluation and consideration of IV Venofer . GI bleed: Patient will require follow-up with GI in the near future. Cardiology: Patient has an appointment on January 11, 2024 she has been instructed to keep this appointment as scheduled.  Patient expressed understanding and was in agreement with this plan. She also understands that She can call clinic at any time with any questions, concerns, or complaints.    Shellie Dials, MD   12/21/2023 11:46 AM

## 2023-12-22 ENCOUNTER — Inpatient Hospital Stay

## 2023-12-22 DIAGNOSIS — D500 Iron deficiency anemia secondary to blood loss (chronic): Secondary | ICD-10-CM

## 2023-12-22 DIAGNOSIS — D509 Iron deficiency anemia, unspecified: Secondary | ICD-10-CM | POA: Diagnosis not present

## 2023-12-22 MED ORDER — SODIUM CHLORIDE 0.9% IV SOLUTION
250.0000 mL | INTRAVENOUS | Status: DC
  Administered 2023-12-22: 250 mL via INTRAVENOUS
  Filled 2023-12-22: qty 250

## 2023-12-22 MED ORDER — ACETAMINOPHEN 325 MG PO TABS
650.0000 mg | ORAL_TABLET | Freq: Once | ORAL | Status: AC
Start: 2023-12-22 — End: 2023-12-22
  Administered 2023-12-22: 650 mg via ORAL
  Filled 2023-12-22: qty 2

## 2023-12-22 MED ORDER — DIPHENHYDRAMINE HCL 50 MG/ML IJ SOLN
25.0000 mg | Freq: Once | INTRAMUSCULAR | Status: AC
  Administered 2023-12-22: 25 mg via INTRAVENOUS
  Filled 2023-12-22: qty 1

## 2023-12-23 LAB — TYPE AND SCREEN
ABO/RH(D): A POS
Antibody Screen: NEGATIVE
Unit division: 0

## 2023-12-23 LAB — BPAM RBC
Blood Product Expiration Date: 202506142359
ISSUE DATE / TIME: 202505150914
Unit Type and Rh: 202506142359
Unit Type and Rh: 6200

## 2023-12-26 ENCOUNTER — Encounter (HOSPITAL_COMMUNITY): Payer: Self-pay | Admitting: Internal Medicine

## 2023-12-27 ENCOUNTER — Encounter (INDEPENDENT_AMBULATORY_CARE_PROVIDER_SITE_OTHER): Payer: Self-pay

## 2023-12-28 ENCOUNTER — Encounter: Payer: Self-pay | Admitting: Internal Medicine

## 2024-01-03 ENCOUNTER — Other Ambulatory Visit: Payer: Self-pay | Admitting: *Deleted

## 2024-01-03 DIAGNOSIS — D500 Iron deficiency anemia secondary to blood loss (chronic): Secondary | ICD-10-CM

## 2024-01-04 ENCOUNTER — Encounter: Payer: Self-pay | Admitting: Oncology

## 2024-01-04 ENCOUNTER — Inpatient Hospital Stay (HOSPITAL_BASED_OUTPATIENT_CLINIC_OR_DEPARTMENT_OTHER): Admitting: Oncology

## 2024-01-04 ENCOUNTER — Inpatient Hospital Stay

## 2024-01-04 VITALS — BP 135/50 | HR 56 | Temp 96.5°F | Resp 18

## 2024-01-04 VITALS — BP 125/73 | HR 55 | Temp 97.1°F | Resp 15 | Wt 99.1 lb

## 2024-01-04 DIAGNOSIS — D509 Iron deficiency anemia, unspecified: Secondary | ICD-10-CM | POA: Diagnosis not present

## 2024-01-04 DIAGNOSIS — D500 Iron deficiency anemia secondary to blood loss (chronic): Secondary | ICD-10-CM | POA: Diagnosis not present

## 2024-01-04 LAB — SAMPLE TO BLOOD BANK

## 2024-01-04 LAB — CBC WITH DIFFERENTIAL/PLATELET
Abs Immature Granulocytes: 0.02 10*3/uL (ref 0.00–0.07)
Basophils Absolute: 0.1 10*3/uL (ref 0.0–0.1)
Basophils Relative: 1 %
Eosinophils Absolute: 0.1 10*3/uL (ref 0.0–0.5)
Eosinophils Relative: 2 %
HCT: 32.1 % — ABNORMAL LOW (ref 36.0–46.0)
Hemoglobin: 10 g/dL — ABNORMAL LOW (ref 12.0–15.0)
Immature Granulocytes: 0 %
Lymphocytes Relative: 6 %
Lymphs Abs: 0.5 10*3/uL — ABNORMAL LOW (ref 0.7–4.0)
MCH: 29.5 pg (ref 26.0–34.0)
MCHC: 31.2 g/dL (ref 30.0–36.0)
MCV: 94.7 fL (ref 80.0–100.0)
Monocytes Absolute: 0.7 10*3/uL (ref 0.1–1.0)
Monocytes Relative: 8 %
Neutro Abs: 6.8 10*3/uL (ref 1.7–7.7)
Neutrophils Relative %: 83 %
Platelets: 456 10*3/uL — ABNORMAL HIGH (ref 150–400)
RBC: 3.39 MIL/uL — ABNORMAL LOW (ref 3.87–5.11)
RDW: 16.5 % — ABNORMAL HIGH (ref 11.5–15.5)
WBC: 8.2 10*3/uL (ref 4.0–10.5)
nRBC: 0 % (ref 0.0–0.2)

## 2024-01-04 LAB — IRON AND TIBC
Iron: 18 ug/dL — ABNORMAL LOW (ref 28–170)
Saturation Ratios: 4 % — ABNORMAL LOW (ref 10.4–31.8)
TIBC: 421 ug/dL (ref 250–450)
UIBC: 403 ug/dL

## 2024-01-04 LAB — FERRITIN: Ferritin: 22 ng/mL (ref 11–307)

## 2024-01-04 MED ORDER — IRON SUCROSE 20 MG/ML IV SOLN
200.0000 mg | Freq: Once | INTRAVENOUS | Status: AC
  Administered 2024-01-04: 200 mg via INTRAVENOUS

## 2024-01-04 NOTE — Progress Notes (Signed)
 [facility]  [phone] [phone]  ID: Yolanda Jimenez OB: 08-09-1946  MR#: [id number]  GUY#:[id number]  Patient Care Team: Sari Cunning, MD as PCP - General (Unknown Physician Specialty) Shellie Dials, MD as Consulting Physician (Hematology and Oncology)  CHIEF COMPLAINT: Iron  deficiency anemia.  INTERVAL HISTORY: Patient returns to clinic today for repeat laboratory work, further evaluation, and consideration of IV Venofer .  Her fatigue has improved since receiving 1 unit of packed red blood cells proximately 2 weeks ago.  She has an intermittent "tingling" in her left upper arm, but no other associated symptoms.  She otherwise feels well and is asymptomatic.  She has no other neurologic complaints.  She denies any recent fevers or illnesses.  She has a good appetite and denies weight loss.  She has no chest pain, shortness of breath, cough, or hemoptysis.  She denies any nausea, vomiting, constipation, or diarrhea.  She has noted no melena or hematochezia recently.  She has no urinary complaints.  Patient offered no further specific complaints today.  REVIEW OF SYSTEMS:   Review of Systems  Constitutional: Negative.  Negative for fever, malaise/fatigue and weight loss.  Respiratory: Negative.  Negative for cough, hemoptysis and shortness of breath.   Cardiovascular: Negative.  Negative for chest pain and leg swelling.  Gastrointestinal: Negative.  Negative for abdominal pain, blood in stool and melena.  Genitourinary: Negative.  Negative for dysuria.  Musculoskeletal: Negative.  Negative for back pain.  Skin: Negative.  Negative for rash.  Neurological:  Positive for tingling. Negative for dizziness, focal weakness, weakness and headaches.  Psychiatric/Behavioral: Negative.  The patient is not nervous/anxious.     As per HPI. Otherwise, a complete review of systems is negative.  PAST MEDICAL HISTORY: Past Medical History:  Diagnosis  Date   A-fib [facility])    a.) CHA2DS2-VASc = 5 (age x2, sex, vascular disease history, T2DM) as of 08/23/2023; b.) cardiac rate/rhythm maintained on oral metoprolol  succinate; no chronic OAC   Abnormal vaginal Pap smear 2015   Anxiety    a.) on BZO PRN (diazepam )   Aortic atherosclerosis (HCC)    Aortic stenosis 05/03/2023   a.) TTE 05/03/2023: mild AS (MPG 12 mmHg; AVA = 1.3 cm2)   Arthritis of spine    Asthma    Barrett esophagus    Benign hematuria 08/29/2014   W/u neg  Normal renal ultrasound 2/19  Cystoscopy normal 2019, [place]  CT -9/24     Bilateral carotid artery disease (HCC)    a.) carotid dopplers 10/13/2016, 10/24/2018, 10/26/2019, 10/21/2020, 10/22/2022: 1-39% BICA   C. difficile diarrhea    CAD (coronary artery disease)    a.) CT chest 07/12/2018: LAD calcifications; b.) CT CAP 11/18/2022: 3 vessel CAD; c.) cPET 05/19/2023: severe 4 vessel coronary calcs mainly in LAD distribution; d.) cPET 05/03/2023: small/mild reversible defect in mid-anterosep/ant region   Complication of anesthesia    a.) delayed emergence; b.) emergence delirium; "I get really loopy after surgery"   COPD (chronic obstructive pulmonary disease) (HCC)    DDD (degenerative disc disease), lumbosacral    Depression    Diverticulosis    Epigastric hernia    GERD (gastroesophageal reflux disease)    Glaucoma    Heart murmur    Heart palpitations    History of bilateral cataract extraction 2015   History of recurrent UTIs    Hx of adenomatous colonic polyps    Hyperlipidemia    IBS (irritable bowel syndrome)  Insomnia    Iron  deficiency anemia    Kidney cysts    Long term current use of aspirin     Macular degeneration of left eye    Osteoporosis    a.) on RANKLi (denosumab )   PVD (peripheral vascular disease) with claudication (HCC)    Small bowel arteriovenous malformation 2007   a.) noted on VCE in 2007   Type 2 diabetes, diet controlled (HCC)    Wears dentures    partial lower    PAST  SURGICAL HISTORY: Past Surgical History:  Procedure Laterality Date   ABDOMINAL AORTIC ENDOVASCULAR STENT GRAFT N/A 08/24/2023   Procedure: ABDOMINAL AORTIC ENDOVASCULAR STENT GRAFT;  Surgeon: Celso College, MD;  Location: [facility] ORS;  Service: Vascular;  Laterality: N/A;   BUNIONECTOMY Right    CATARACT EXTRACTION Bilateral    CERVICAL CONE BIOPSY     CHOLECYSTECTOMY     COLONOSCOPY     COLONOSCOPY WITH PROPOFOL  N/A 01/22/2019   Procedure: COLONOSCOPY WITH BIOPSY;  Surgeon: Marnee Sink, MD;  Location: [facility];  Service: Endoscopy;  Laterality: N/A;  diabetic - diet controlled   COLONOSCOPY WITH PROPOFOL  N/A 06/27/2020   Procedure: COLONOSCOPY WITH BIOPSY;  Surgeon: Marnee Sink, MD;  Location: [facility];  Service: Endoscopy;  Laterality: N/A;  priority 4   ENDARTERECTOMY FEMORAL Bilateral 08/24/2023   Procedure: ENDARTERECTOMY FEMORAL;  Surgeon: Celso College, MD;  Location: [facility] OR[facility];  Service: Vascular;  Laterality: Bilateral;   ESOPHAGOGASTRODUODENOSCOPY (EGD) WITH PROPOFOL  N/A 08/29/2017   Procedure: ESOPHAGOGASTRODUODENOSCOPY (EGD) WITH PROPOFOL ;  Surgeon: Marnee Sink, MD;  Location: [facility];  Service: Endoscopy;  Laterality: N/A;   ESOPHAGOGASTRODUODENOSCOPY (EGD) WITH PROPOFOL  N/A 09/16/2023   Procedure: ESOPHAGOGASTRODUODENOSCOPY (EGD) WITH PROPOFOL ;  Surgeon: Marnee Sink, MD;  Location: [facility] END[facility];  Service: Endoscopy;  Laterality: N/A;   FOOT SURGERY     right 2nd toe   HIP ARTHROPLASTY Left 05/16/2018   Procedure: ARTHROPLASTY BIPOLAR HIP (HEMIARTHROPLASTY);  Surgeon: Elner Hahn, MD;  Location: [facility] OR[facility];  Service: Orthopedics;  Laterality: Left;   HOT HEMOSTASIS  09/16/2023   Procedure: HOT HEMOSTASIS (ARGON PLASMA COAGULATION/BICAP);  Surgeon: Marnee Sink, MD;  Location: [facility];  Service: Endoscopy;;   INSERTION OF ILIAC STENT Bilateral 08/24/2023   Procedure: INSERTION OF ILIAC STENT;  Surgeon: Celso College, MD;  Location: [facility] OR[facility];   Service: Vascular;  Laterality: Bilateral;   IR BONE MARROW BIOPSY & ASPIRATION  12/09/2023   LOWER EXTREMITY ANGIOGRAPHY Left 04/14/2023   Procedure: Lower Extremity Angiography;  Surgeon: Celso College, MD;  Location: [facility] IN[facility];  Service: Cardiovascular;  Laterality: Left;   ORIF PERIPROSTHETIC FRACTURE Left 12/10/2021   Procedure: OPEN REDUCTION INTERNAL FIXATION OF A LEFT GREATER TROCHANTERIC FRACTURE;  Surgeon: Elner Hahn, MD;  Location: [facility];  Service: Orthopedics;  Laterality: Left;   ORIF WRIST FRACTURE Right 10/07/2020   Procedure: OPEN REDUCTION INTERNAL FIXATION (ORIF) WRIST FRACTURE;  Surgeon: Molli Angelucci, MD;  Location: [facility];  Service: Orthopedics;  Laterality: Right;   POLYPECTOMY N/A 01/22/2019   Procedure: POLYPECTOMY;  Surgeon: Marnee Sink, MD;  Location: [facility];  Service: Endoscopy;  Laterality: N/A;  2 Clips placed at polyp removal site in ascending colon   POLYPECTOMY N/A 06/27/2020   Procedure: POLYPECTOMY;  Surgeon: Marnee Sink, MD;  Location: [facility];  Service: Endoscopy;  Laterality: N/A;   TUBAL LIGATION     UPPER GASTROINTESTINAL ENDOSCOPY      FAMILY HISTORY: Family History  Problem Relation Age of Onset   Diabetes Mother    Heart disease Mother    Hypertension Mother    Diabetes Son    Diabetes Sister    Inflammatory bowel disease Father        diverticulitis   Multiple myeloma Father    Hypertension Son    Diabetes Maternal Grandmother    Colon cancer Neg Hx    Esophageal cancer Neg Hx    Rectal cancer Neg Hx    Stomach cancer Neg Hx    Breast cancer Neg Hx     ADVANCED DIRECTIVES (Y/N):  N  HEALTH MAINTENANCE: Social History   Tobacco Use   Smoking status: Every Day    Current packs/day: 0.50    Average packs/day: 0.5 packs/day for 55.0 years (27.5 ttl pk-yrs)    Types: Cigarettes   Smokeless tobacco: Never   Tobacco comments:    she has patches to start just not started yet. smoked since  teenager.  Vaping Use   Vaping status: Never Used  Substance Use Topics   Alcohol use: Yes    Alcohol/week: 2.0 standard drinks of alcohol    Types: 2 Glasses of wine per week    Comment: occ wine 1-2 weekly   Drug use: No     Colonoscopy:  PAP:  Bone density:  Lipid panel:  Allergies  Allergen Reactions   Codeine     "felt drunk, crazy"   Hydrocodone Other (See Comments)   Hydrocodone-Acetaminophen  Other (See Comments)    Confusion/delirium   Lidocaine      Heart racing, increased BP - from local at dentist (likely epi)   Nsaids Other (See Comments)    Avoids due to IBS symptoms (bleeding symptoms)   Oxycodone      Confusion/delirium    Current Outpatient Medications  Medication Sig Dispense Refill   acetaminophen  (TYLENOL ) 500 MG tablet Take 1,000 mg by mouth every 6 (six) hours as needed.     aspirin  EC 81 MG tablet Take 1 tablet (81 mg total) by mouth daily. Swallow whole. 150 tablet 2   buPROPion  (WELLBUTRIN ) 75 MG tablet Take 75 mg by mouth every morning.     Cholecalciferol  125 MCG (5000 UT) TABS Take 1 tablet by mouth daily.     clopidogrel  (PLAVIX ) 75 MG tablet Take 1 tablet (75 mg total) by mouth daily. 30 tablet 6   cyanocobalamin  1000 MCG tablet Take 1,000 mcg by mouth daily.     denosumab  (PROLIA ) 60 MG/ML SOSY injection Inject 60 mg into the skin every 6 (six) months.     donepezil  (ARICEPT ) 5 MG tablet Take 1 tablet by mouth at bedtime.     Ferrous Gluconate  239 (27 Fe) MG TABS Take 1 tablet by mouth daily.     fluticasone  (FLONASE ) 50 MCG/ACT nasal spray Place 1 spray into both nostrils daily as needed for allergies or rhinitis.     loperamide  (IMODIUM ) 2 MG capsule Take 2 mg by mouth every morning.     metoprolol  succinate (TOPROL -XL) 25 MG 24 hr tablet Take 1 tablet by mouth daily. Added 1/2 tablet at night as well     pantoprazole  (PROTONIX ) 40 MG tablet Take 40 mg by mouth daily.     rosuvastatin  (CRESTOR ) 20 MG tablet Take 20 mg by mouth daily.      No current facility-administered medications for this visit.    OBJECTIVE: Vitals:   01/04/24 0942  BP: 125/73  Pulse: (!) 55  Resp: 15  Temp: Aaron Aas)  97.1 F (36.2 C)  SpO2: 100%     Body mass index is 18.72 kg/m.    ECOG FS:0 - Asymptomatic  General: Well-developed, well-nourished, no acute distress. Eyes: Pink conjunctiva, anicteric sclera. HEENT: Normocephalic, moist mucous membranes. Lungs: No audible wheezing or coughing. Heart: Regular rate and rhythm. Abdomen: Soft, nontender, no obvious distention. Musculoskeletal: No edema, cyanosis, or clubbing. Neuro: Alert, answering all questions appropriately. Cranial nerves grossly intact. Skin: No rashes or petechiae noted. Psych: Normal affect.  LAB RESULTS:  Lab Results  Component Value Date   NA 140 09/17/2023   K 4.1 09/17/2023   CL 109 09/17/2023   CO2 24 09/17/2023   GLUCOSE 110 (H) 09/17/2023   BUN 15 09/17/2023   CREATININE 0.70 09/17/2023   CALCIUM  8.4 (L) 09/17/2023   PROT 5.3 (L) 09/17/2023   ALBUMIN  3.1 (L) 09/17/2023   AST 15 09/17/2023   ALT 9 09/17/2023   ALKPHOS 37 (L) 09/17/2023   BILITOT 0.5 11/02/2023   GFRNONAA >60 09/17/2023   GFRAA >60 05/18/2018    Lab Results  Component Value Date   WBC 8.2 01/04/2024   NEUTROABS 6.8 01/04/2024   HGB 10.0 (L) 01/04/2024   HCT 32.1 (L) 01/04/2024   MCV 94.7 01/04/2024   PLT 456 (H) 01/04/2024   Lab Results  Component Value Date   IRON  27 (L) 12/21/2023   TIBC 388 12/21/2023   IRONPCTSAT 7 (L) 12/21/2023   Lab Results  Component Value Date   FERRITIN 47 12/21/2023     STUDIES: IR BONE MARROW BIOPSY & ASPIRATION Result Date: 12/09/2023 INDICATION: 78 year old female referred for bone marrow biopsy EXAM: IR BONE MARRO BIOPY AND ASPIRATION MEDICATIONS: None. ANESTHESIA/SEDATION: Moderate (conscious) sedation was employed during this procedure. A total of Versed  0.5 mg and Fentanyl  20 mcg was administered intravenously. Moderate Sedation Time: 10  minutes. The patient's level of consciousness and vital signs were monitored continuously by radiology nursing throughout the procedure under my direct supervision. FLUOROSCOPY TIME:  Fluoroscopy Time:  (1 mGy). COMPLICATIONS: None PROCEDURE: Informed written consent was obtained from the patient after a thorough discussion of the procedural risks, benefits and alternatives. All questions were addressed. Maximal Sterile Barrier Technique was utilized including caps, mask, sterile gowns, sterile gloves, sterile drape, hand hygiene and skin antiseptic. A timeout was performed prior to the initiation of the procedure. The procedure risks, benefits, and alternatives were explained to the patient. Questions regarding the procedure were encouraged and answered. The patient understands and consents to the procedure. Patient was positioned prone under the image intensifier. Physical exam was used to determine the L5- S1 level, and then the posterior pelvis was prepped with Chlorhexidine  in a sterile fashion. A sterile drape was applied covering the operative field. Sterile gown/sterile gloves were used for the procedure. Local anesthesia was provided with 1% Lidocaine . Posterior left iliac bone was targeted for biopsy. The skin and subcutaneous tissues were infiltrated with 1% lidocaine  without epinephrine . A small stab incision was made with an 11 blade scalpel, and an 11 gauge Murphy needle was advanced with fluoro guidance to the posterior cortex. Manual forced was used to advance the needle through the posterior cortex and the stylet was removed. A bone marrow aspirate was retrieved and passed to a cytotechnologist in the room. The Murphy needle was then advanced without the stylet for a core biopsy. The core biopsy was retrieved and also passed to a cytotechnologist. Manual pressure was used for hemostasis and a sterile dressing was placed. No complications  were encountered no significant blood loss was encountered.  Patient tolerated the procedure well and remained hemodynamically stable throughout. IMPRESSION: Status post image guided bone marrow biopsy. Signed, Marciano Settles. Rexine Cater, RPVI [facility] Electronically Signed   By: Myrlene Asper D.O.   On: 12/09/2023 10:26    ASSESSMENT: Iron  deficiency anemia.  PLAN:    Iron  deficiency anemia: Likely secondary to GI bleed.  Patient underwent EGD on September 16, 2023 that revealed multiple angiodysplastic lesions with recent stigmata of bleeding throughout the entire duodenum.  These were subsequently treated with argon plasma coagulation.  Recommendation at that time was to repeat endoscopy in 6 months when patient was able to hold Plavix .  She also underwent bone marrow biopsy on Dec 09, 2023 that only revealed a hypercellular bone marrow with relative relative erythroid hyperplasia suggestive of compensatory hyperplasia.  Myeloid series was reported as unremarkable.  Cytogenetics was normal as well.  Patient continues to have a decreased hemoglobin and iron  stores, therefore we will proceed with 200 mg IV Venofer  today.  Return to clinic 4 times over the next 2 weeks to receive additional treatment.  Patient will then return to clinic in 3 months with repeat laboratory work, further evaluation, and continuation of treatment if needed.   GI bleed: Patient will require follow-up with GI in the near future. Cardiology: Patient has an appointment on January 11, 2024 she has been instructed to keep this appointment as scheduled. Left arm "tingling": No discernible pattern.  Does not appear cardiac in nature.  Monitor and follow-up with cardiology as above.  I spent a total of 30 minutes reviewing chart data, face-to-face evaluation with the patient, counseling and coordination of care as detailed above.   Patient expressed understanding and was in agreement with this plan. She also understands that She can call  clinic at any time with any questions, concerns, or complaints.    Shellie Dials, MD   01/04/2024 11:08 AM

## 2024-01-05 ENCOUNTER — Inpatient Hospital Stay

## 2024-01-05 ENCOUNTER — Encounter: Payer: Self-pay | Admitting: Internal Medicine

## 2024-01-09 ENCOUNTER — Inpatient Hospital Stay: Attending: Internal Medicine

## 2024-01-09 ENCOUNTER — Other Ambulatory Visit: Payer: Self-pay | Admitting: Oncology

## 2024-01-09 VITALS — BP 122/70 | HR 67 | Temp 97.0°F | Resp 18

## 2024-01-09 DIAGNOSIS — D500 Iron deficiency anemia secondary to blood loss (chronic): Secondary | ICD-10-CM

## 2024-01-09 DIAGNOSIS — D509 Iron deficiency anemia, unspecified: Secondary | ICD-10-CM | POA: Insufficient documentation

## 2024-01-09 MED ORDER — IRON SUCROSE 20 MG/ML IV SOLN
200.0000 mg | Freq: Once | INTRAVENOUS | Status: AC
  Administered 2024-01-09: 200 mg via INTRAVENOUS
  Filled 2024-01-09: qty 10

## 2024-01-09 NOTE — Patient Instructions (Signed)

## 2024-01-12 ENCOUNTER — Inpatient Hospital Stay

## 2024-01-12 VITALS — BP 121/72 | HR 82 | Temp 98.3°F | Resp 18

## 2024-01-12 DIAGNOSIS — D509 Iron deficiency anemia, unspecified: Secondary | ICD-10-CM | POA: Diagnosis not present

## 2024-01-12 DIAGNOSIS — D500 Iron deficiency anemia secondary to blood loss (chronic): Secondary | ICD-10-CM

## 2024-01-12 IMAGING — DX DG HIP (WITH OR WITHOUT PELVIS) 2-3V*L*
2 series · 2 of 2 positions shown · non-contrast
Comparison: Portable exam 9031 hours compared to 12/06/2021

CLINICAL DATA: Post LEFT hip surgery for trochanteric avulsion
fracture

EXAM:
DG HIP (WITH OR WITHOUT PELVIS) 2-3V LEFT

[pelvis ap]
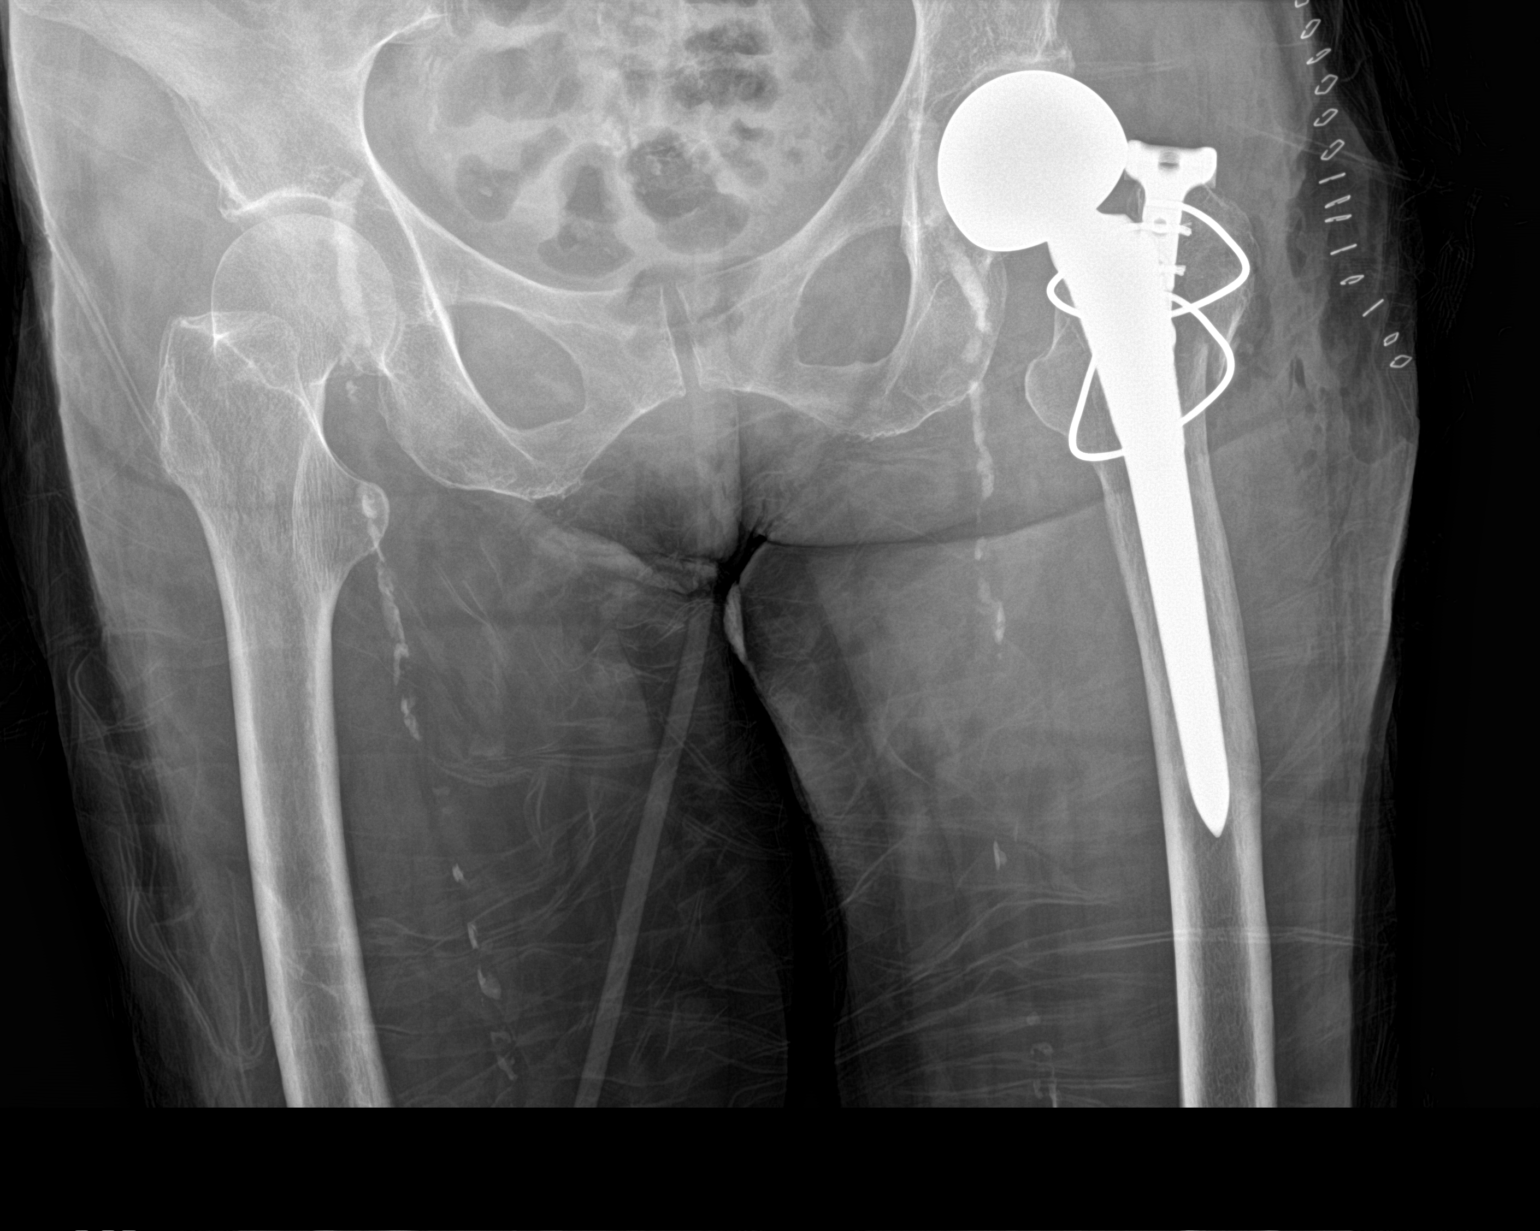

[hip lat]
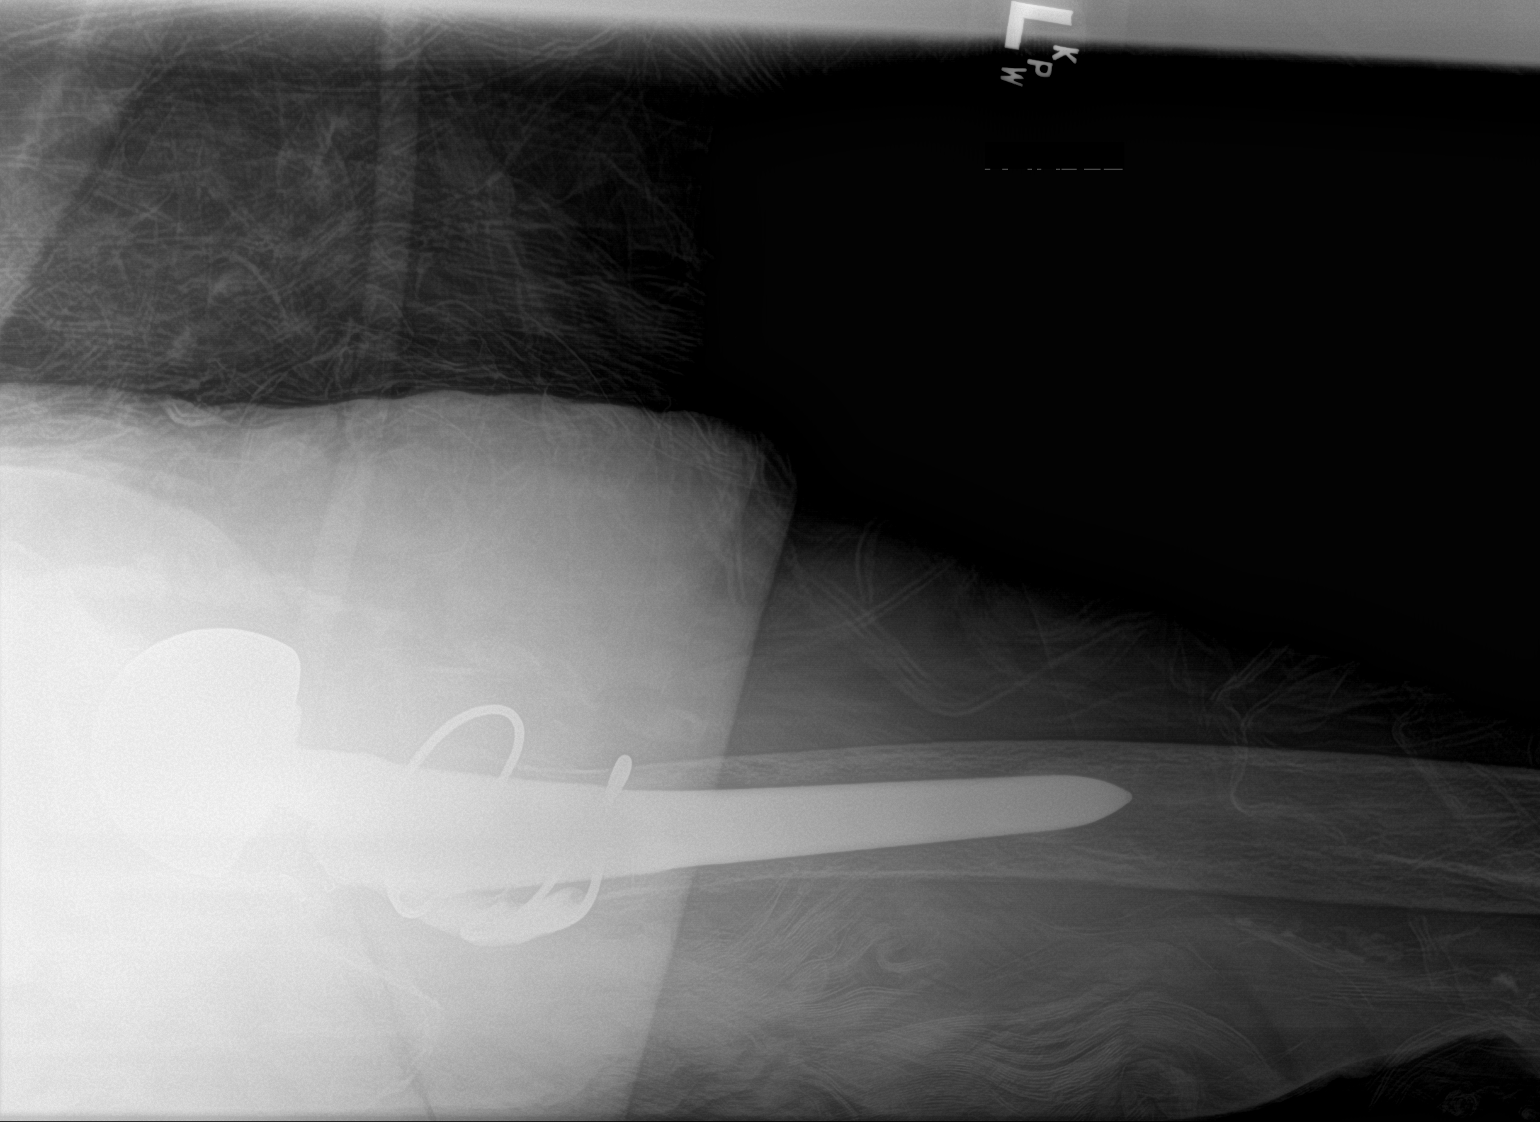

[2 of 2 positions shown; findings below may reference images not displayed]

FINDINGS: LEFT hip prosthesis can identified.

A plate and screws as well as 2 cerclage wires have been placed
across the greater trochanteric fracture fragment.

No dislocation.

Bones demineralized.

Scattered atherosclerotic calcifications.
IMPRESSION: Post ORIF of greater trochanteric avulsion fracture.

Osseous demineralization and LEFT hip prosthesis again noted.

## 2024-01-12 MED ORDER — IRON SUCROSE 20 MG/ML IV SOLN
200.0000 mg | Freq: Once | INTRAVENOUS | Status: AC
  Administered 2024-01-12: 200 mg via INTRAVENOUS

## 2024-01-12 NOTE — Patient Instructions (Signed)

## 2024-01-16 ENCOUNTER — Inpatient Hospital Stay

## 2024-01-16 VITALS — BP 137/77 | HR 63 | Temp 96.6°F | Resp 17

## 2024-01-16 DIAGNOSIS — D509 Iron deficiency anemia, unspecified: Secondary | ICD-10-CM | POA: Diagnosis not present

## 2024-01-16 DIAGNOSIS — D500 Iron deficiency anemia secondary to blood loss (chronic): Secondary | ICD-10-CM

## 2024-01-16 MED ORDER — IRON SUCROSE 20 MG/ML IV SOLN
200.0000 mg | Freq: Once | INTRAVENOUS | Status: AC
  Administered 2024-01-16: 200 mg via INTRAVENOUS

## 2024-01-16 NOTE — Patient Instructions (Signed)
 [facility] - A DEPT OF [facility]  Discharge Instructions: Thank you for choosing [facility] to provide your oncology and hematology care.  If you have a lab appointment with the Cancer Center, please go directly to the Cancer Center and check in at the registration area.  Wear comfortable clothing and clothing appropriate for easy access to any Portacath or PICC line.   We strive to give you quality time with your provider. You may need to reschedule your appointment if you arrive late (15 or more minutes).  Arriving late affects you and other patients whose appointments are after yours.  Also, if you miss three or more appointments without notifying the office, you may be dismissed from the clinic at the provider's discretion.      For prescription refill requests, have your pharmacy contact our office and allow 72 hours for refills to be completed.    Today you received the following chemotherapy and/or immunotherapy agents venofer      To help prevent nausea and vomiting after your treatment, we encourage you to take your nausea medication as directed.  BELOW ARE SYMPTOMS THAT SHOULD BE REPORTED IMMEDIATELY: *FEVER GREATER THAN 100.4 F (38 C) OR HIGHER *CHILLS OR SWEATING *NAUSEA AND VOMITING THAT IS NOT CONTROLLED WITH YOUR NAUSEA MEDICATION *UNUSUAL SHORTNESS OF BREATH *UNUSUAL BRUISING OR BLEEDING *URINARY PROBLEMS (pain or burning when urinating, or frequent urination) *BOWEL PROBLEMS (unusual diarrhea, constipation, pain near the anus) TENDERNESS IN MOUTH AND THROAT WITH OR WITHOUT PRESENCE OF ULCERS (sore throat, sores in mouth, or a toothache) UNUSUAL RASH, SWELLING OR PAIN  UNUSUAL VAGINAL DISCHARGE OR ITCHING   Items with * indicate a potential emergency and should be followed up as soon as possible or go to the Emergency Department if any problems should occur.  Please show the CHEMOTHERAPY ALERT CARD or IMMUNOTHERAPY  ALERT CARD at check-in to the Emergency Department and triage nurse.  Should you have questions after your visit or need to cancel or reschedule your appointment, please contact [facility] - A DEPT OF [facility]  [phone] and follow the prompts.  Office hours are 8:00 a.m. to 4:30 p.m. Monday - Friday. Please note that voicemails left after 4:00 p.m. may not be returned until the following business day.  We are closed weekends and major holidays. You have access to a nurse at all times for urgent questions. Please call the main number to the clinic [phone] and follow the prompts.  For any non-urgent questions, you may also contact your provider using MyChart. We now offer e-Visits for anyone 3 and older to request care online for non-urgent symptoms. For details visit [email or web address].   Also download the MyChart app! Go to the app store, search "MyChart", open the app, select [facility], and log in with your MyChart username and password.

## 2024-01-19 ENCOUNTER — Inpatient Hospital Stay

## 2024-01-19 VITALS — BP 102/53 | HR 52 | Temp 97.9°F | Resp 16

## 2024-01-19 DIAGNOSIS — D500 Iron deficiency anemia secondary to blood loss (chronic): Secondary | ICD-10-CM

## 2024-01-19 DIAGNOSIS — D509 Iron deficiency anemia, unspecified: Secondary | ICD-10-CM | POA: Diagnosis not present

## 2024-01-19 MED ORDER — IRON SUCROSE 20 MG/ML IV SOLN
200.0000 mg | Freq: Once | INTRAVENOUS | Status: AC
  Administered 2024-01-19: 200 mg via INTRAVENOUS

## 2024-01-19 NOTE — Patient Instructions (Signed)

## 2024-01-19 NOTE — Progress Notes (Signed)
 During appointment, pt expressed that she continues to have racing heart x1 month, along with intermittent left arm tingling, but not necessarily both at the same time. She also voiced feeling wobbly x2-3 weeks, sometimes running into furniture. Pt expressed that she has reported her symptoms to several providers, including her cardiologist, and feels that no one is listening to her or offering any explanations. She denies any chest pain or shortness of breath, and vitals remain stable. Discussed concerns with Dr. Adrian Alba, who feels that these symptoms are not hematology/anemia related, and patient should contact her cardiologist office and request first available appointment to be seen; however if pt feels this is something urgent, she should go to the ED. This was relayed to the patient, who voided understanding.

## 2024-02-03 ENCOUNTER — Ambulatory Visit: Admitting: Physician Assistant

## 2024-02-03 VITALS — BP 115/68 | HR 75 | Ht 61.0 in | Wt 97.0 lb

## 2024-02-03 DIAGNOSIS — R351 Nocturia: Secondary | ICD-10-CM | POA: Diagnosis not present

## 2024-02-03 DIAGNOSIS — R3129 Other microscopic hematuria: Secondary | ICD-10-CM

## 2024-02-03 LAB — MICROSCOPIC EXAMINATION

## 2024-02-03 LAB — URINALYSIS, COMPLETE
Bilirubin, UA: NEGATIVE
Glucose, UA: NEGATIVE
Leukocytes,UA: NEGATIVE
Nitrite, UA: NEGATIVE
Specific Gravity, UA: 1.03 (ref 1.005–1.030)
Urobilinogen, Ur: 0.2 mg/dL (ref 0.2–1.0)
pH, UA: 6 (ref 5.0–7.5)

## 2024-02-03 MED ORDER — TROSPIUM CHLORIDE 20 MG PO TABS: 20.0000 mg | ORAL_TABLET | Freq: Every day | ORAL | 11 refills | Status: AC

## 2024-02-03 NOTE — Progress Notes (Signed)
 02/03/2024 1:41 PM   Yolanda Jimenez [birth year] [id number]  CC: Chief Complaint  Patient presents with   Nocturia   HPI: Yolanda Jimenez is a 78 y.o. female with PMH microscopic hematuria with benign cystoscopy in February 2023 and benign CTU and negative urine cytology in 2024; left renal cyst; recurrent UTI; LUTS including urgency, frequency, and weak stream; and nocturia who failed beta 3 agonists who presents today for evaluation of nocturia.   I saw her in clinic 5 times last year to discuss her nocturia and repeatedly recommended a sleep study, however this has not been done.  She had no improvement on Myrbetriq  or Gemtesa .  Today she reports 6 weeks of new, large volume nocturia x 3-5.  She does not recall seeing me previously to discuss this and feels that it is a new problem.  She denies dysuria.  In-office UA today positive for 1+ protein, 2+ blood, and trace ketones; urine microscopy with 11-30 RBCs/HPF and moderate bacteria.   PMH: Past Medical History:  Diagnosis Date   [facility])    a.) CHA2DS2-VASc = 5 (age x2, sex, vascular disease history, T2DM) as of 08/23/2023; b.) cardiac rate/rhythm maintained on oral metoprolol  succinate; no chronic OAC   Abnormal vaginal Pap smear 2015   Anxiety    a.) on BZO PRN (diazepam )   Aortic atherosclerosis (HCC)    Aortic stenosis 05/03/2023   a.) TTE 05/03/2023: mild AS (MPG 12 mmHg; AVA = 1.3 cm2)   Arthritis of spine    Asthma    Barrett esophagus    Benign hematuria 08/29/2014   W/u neg  Normal renal ultrasound 2/19  Cystoscopy normal 2019, [place]  CT -9/24     Bilateral carotid artery disease (HCC)    a.) carotid dopplers 10/13/2016, 10/24/2018, 10/26/2019, 10/21/2020, 10/22/2022: 1-39% BICA   C. difficile diarrhea    CAD (coronary artery disease)    a.) CT chest 07/12/2018: LAD calcifications; b.) CT CAP 11/18/2022: 3 vessel CAD; c.) cPET 05/19/2023: severe 4 vessel coronary calcs mainly in LAD distribution; d.) cPET  05/03/2023: small/mild reversible defect in mid-anterosep/ant region   Complication of anesthesia    a.) delayed emergence; b.) emergence delirium; I get really loopy after surgery   COPD (chronic obstructive pulmonary disease) (HCC)    DDD (degenerative disc disease), lumbosacral    Depression    Diverticulosis    Epigastric hernia    GERD (gastroesophageal reflux disease)    Glaucoma    Heart murmur    Heart palpitations    History of bilateral cataract extraction 2015   History of recurrent UTIs    Hx of adenomatous colonic polyps    Hyperlipidemia    IBS (irritable bowel syndrome)    Insomnia    Iron  deficiency anemia    Kidney cysts    Long term current use of aspirin     Macular degeneration of left eye    Osteoporosis    a.) on RANKLi (denosumab )   PVD (peripheral vascular disease) with claudication (HCC)    Small bowel arteriovenous malformation 2007   a.) noted on VCE in 2007   Type 2 diabetes, diet controlled (HCC)    Wears dentures    partial lower    Surgical History: Past Surgical History:  Procedure Laterality Date   ABDOMINAL AORTIC ENDOVASCULAR STENT GRAFT N/A 08/24/2023   Procedure: ABDOMINAL AORTIC ENDOVASCULAR STENT GRAFT;  Surgeon: Marea Selinda RAMAN, MD;  Location: [facility];  Service: Vascular;  Laterality: N/A;  BUNIONECTOMY Right    CATARACT EXTRACTION Bilateral    CERVICAL CONE BIOPSY     CHOLECYSTECTOMY     COLONOSCOPY     COLONOSCOPY WITH PROPOFOL  N/A 01/22/2019   Procedure: COLONOSCOPY WITH BIOPSY;  Surgeon: Jinny Carmine, MD;  Location: [facility];  Service: Endoscopy;  Laterality: N/A;  diabetic - diet controlled   COLONOSCOPY WITH PROPOFOL  N/A 06/27/2020   Procedure: COLONOSCOPY WITH BIOPSY;  Surgeon: Jinny Carmine, MD;  Location: [facility];  Service: Endoscopy;  Laterality: N/A;  priority 4   ENDARTERECTOMY FEMORAL Bilateral 08/24/2023   Procedure: ENDARTERECTOMY FEMORAL;  Surgeon: Marea Selinda RAMAN, MD;  Location: [facility];   Service: Vascular;  Laterality: Bilateral;   ESOPHAGOGASTRODUODENOSCOPY (EGD) WITH PROPOFOL  N/A 08/29/2017   Procedure: ESOPHAGOGASTRODUODENOSCOPY (EGD) WITH PROPOFOL ;  Surgeon: Jinny Carmine, MD;  Location: [facility];  Service: Endoscopy;  Laterality: N/A;   ESOPHAGOGASTRODUODENOSCOPY (EGD) WITH PROPOFOL  N/A 09/16/2023   Procedure: ESOPHAGOGASTRODUODENOSCOPY (EGD) WITH PROPOFOL ;  Surgeon: Jinny Carmine, MD;  Location: [facility];  Service: Endoscopy;  Laterality: N/A;   FOOT SURGERY     right 2nd toe   HIP ARTHROPLASTY Left 05/16/2018   Procedure: ARTHROPLASTY BIPOLAR HIP (HEMIARTHROPLASTY);  Surgeon: Edie Norleen PARAS, MD;  Location: [facility];  Service: Orthopedics;  Laterality: Left;   HOT HEMOSTASIS  09/16/2023   Procedure: HOT HEMOSTASIS (ARGON PLASMA COAGULATION/BICAP);  Surgeon: Jinny Carmine, MD;  Location: [facility];  Service: Endoscopy;;   INSERTION OF ILIAC STENT Bilateral 08/24/2023   Procedure: INSERTION OF ILIAC STENT;  Surgeon: Marea Selinda RAMAN, MD;  Location: [facility];  Service: Vascular;  Laterality: Bilateral;   IR BONE MARROW BIOPSY & ASPIRATION  12/09/2023   LOWER EXTREMITY ANGIOGRAPHY Left 04/14/2023   Procedure: Lower Extremity Angiography;  Surgeon: Marea Selinda RAMAN, MD;  Location: [facility];  Service: Cardiovascular;  Laterality: Left;   ORIF PERIPROSTHETIC FRACTURE Left 12/10/2021   Procedure: OPEN REDUCTION INTERNAL FIXATION OF A LEFT GREATER TROCHANTERIC FRACTURE;  Surgeon: Edie Norleen PARAS, MD;  Location: [facility];  Service: Orthopedics;  Laterality: Left;   ORIF WRIST FRACTURE Right 10/07/2020   Procedure: OPEN REDUCTION INTERNAL FIXATION (ORIF) WRIST FRACTURE;  Surgeon: Kathlynn Sharper, MD;  Location: [facility];  Service: Orthopedics;  Laterality: Right;   POLYPECTOMY N/A 01/22/2019   Procedure: POLYPECTOMY;  Surgeon: Jinny Carmine, MD;  Location: [facility];  Service: Endoscopy;  Laterality: N/A;  2 Clips placed at polyp removal site in ascending colon    POLYPECTOMY N/A 06/27/2020   Procedure: POLYPECTOMY;  Surgeon: Jinny Carmine, MD;  Location: [facility];  Service: Endoscopy;  Laterality: N/A;   TUBAL LIGATION     UPPER GASTROINTESTINAL ENDOSCOPY      Home Medications:  Allergies as of 02/03/2024       Reactions   Codeine    felt drunk, crazy   Hydrocodone Other (See Comments)   Hydrocodone-acetaminophen  Other (See Comments)   Confusion/delirium   Lidocaine     Heart racing, increased BP - from local at dentist (likely epi)   Nsaids Other (See Comments)   Avoids due to IBS symptoms (bleeding symptoms)   Oxycodone     Confusion/delirium        Medication List        Accurate as of February 03, 2024  1:41 PM. If you have any questions, ask your nurse or doctor.          acetaminophen  500 MG tablet Commonly known as: TYLENOL  Take 1,000 mg by mouth every  6 (six) hours as needed.   aspirin  EC 81 MG tablet Take 1 tablet (81 mg total) by mouth daily. Swallow whole.   buPROPion  75 MG tablet Commonly known as: WELLBUTRIN  Take 75 mg by mouth every morning.   Cholecalciferol  125 MCG (5000 UT) Tabs Take 1 tablet by mouth daily.   clopidogrel  75 MG tablet Commonly known as: PLAVIX  Take 1 tablet (75 mg total) by mouth daily.   cyanocobalamin  1000 MCG tablet Take 1,000 mcg by mouth daily.   donepezil  5 MG tablet Commonly known as: ARICEPT  Take 1 tablet by mouth at bedtime.   Ferrous Gluconate  239 (27 Fe) MG Tabs Take 1 tablet by mouth daily.   fluticasone  50 MCG/ACT nasal spray Commonly known as: FLONASE  Place 1 spray into both nostrils daily as needed for allergies or rhinitis.   loperamide  2 MG capsule Commonly known as: IMODIUM  Take 2 mg by mouth every morning.   metoprolol  succinate 25 MG 24 hr tablet Commonly known as: TOPROL -XL Take 1 tablet by mouth daily. Added 1/2 tablet at night as well   pantoprazole  40 MG tablet Commonly known as: PROTONIX  Take 40 mg by mouth daily.   Prolia  60 MG/ML  Sosy injection Generic drug: denosumab  Inject 60 mg into the skin every 6 (six) months.   rosuvastatin  20 MG tablet Commonly known as: CRESTOR  Take 20 mg by mouth daily.        Allergies:  Allergies  Allergen Reactions   Codeine     felt drunk, crazy   Hydrocodone Other (See Comments)   Hydrocodone-Acetaminophen  Other (See Comments)    Confusion/delirium   Lidocaine      Heart racing, increased BP - from local at dentist (likely epi)   Nsaids Other (See Comments)    Avoids due to IBS symptoms (bleeding symptoms)   Oxycodone      Confusion/delirium    Family History: Family History  Problem Relation Age of Onset   Diabetes Mother    Heart disease Mother    Hypertension Mother    Diabetes Son    Diabetes Sister    Inflammatory bowel disease Father        diverticulitis   Multiple myeloma Father    Hypertension Son    Diabetes Maternal Grandmother    Colon cancer Neg Hx    Esophageal cancer Neg Hx    Rectal cancer Neg Hx    Stomach cancer Neg Hx    Breast cancer Neg Hx     Social History:   reports that she has been smoking cigarettes. She has a 27.5 pack-year smoking history. She has never used smokeless tobacco. She reports current alcohol use of about 2.0 standard drinks of alcohol per week. She reports that she does not use drugs.  Physical Exam: BP 115/68   Pulse 75   Ht 5' 1 (1.549 m)   Wt 97 lb (44 kg)   BMI 18.33 kg/m   Constitutional:  Alert and oriented, no acute distress, nontoxic appearing HEENT: [place], AT Cardiovascular: No clubbing, cyanosis, or edema Respiratory: Normal respiratory effort, no increased work of breathing Skin: No rashes, bruises or suspicious lesions Neurologic: Grossly intact, no focal deficits, moving all 4 extremities Psychiatric: Normal mood and affect  Laboratory Data: Results for orders placed or performed in visit on 02/03/24  Microscopic Examination   Collection Time: 02/03/24  1:17 PM   Urine  Result Value Ref  Range   WBC, UA 0-5 0 - 5 /hpf   RBC, Urine 11-30 (A)  0 - 2 /hpf   Epithelial Cells (non renal) 0-10 0 - 10 /hpf   Crystals Present (A) N/A   Crystal Type Amorphous Sediment N/A   Mucus, UA Present (A) Not Estab.   Bacteria, UA Moderate (A) None seen/Few  Urinalysis, Complete   Collection Time: 02/03/24  1:17 PM  Result Value Ref Range   Specific Gravity, UA 1.030 1.005 - 1.030   pH, UA 6.0 5.0 - 7.5   Color, UA Yellow Yellow   Appearance Ur Clear Clear   Leukocytes,UA Negative Negative   Protein,UA 1+ (A) Negative/Trace   Glucose, UA Negative Negative   Ketones, UA Trace (A) Negative   RBC, UA 2+ (A) Negative   Bilirubin, UA Negative Negative   Urobilinogen, Ur 0.2 0.2 - 1.0 mg/dL   Nitrite, UA Negative Negative   Microscopic Examination See below:    Assessment & Plan:   1. Microscopic hematuria (Primary) Persistent microscopic hematuria today.  Will send for atypical culture.  I offered her repeat cystoscopy and she agreed. - Urinalysis, Complete - Mycoplasma / ureaplasma culture  2. Nocturia Chronic despite patient reports.  I continue to recommend sleep study.  She has failed beta 3 agonists.  Will try nighttime trospium, though I am not optimistic this will help.  I do not recommend other antimuscarinics due to cognitive concerns. - trospium (SANCTURA) 20 MG tablet; Take 1 tablet (20 mg total) by mouth at bedtime.  Dispense: 30 tablet; Refill: 11   Return in about 4 weeks (around 03/02/2024) for Cysto and symptom recheck on trospium.  Lucie Hones, PA-C  [facility] [place] [address], [address] [place], [place] [address] [phone]

## 2024-02-10 LAB — MYCOPLASMA / UREAPLASMA CULTURE
Mycoplasma hominis Culture: NEGATIVE
Ureaplasma urealyticum: NEGATIVE

## 2024-02-13 ENCOUNTER — Ambulatory Visit: Payer: Self-pay | Admitting: Physician Assistant

## 2024-02-14 ENCOUNTER — Encounter (INDEPENDENT_AMBULATORY_CARE_PROVIDER_SITE_OTHER): Payer: Self-pay | Admitting: Vascular Surgery

## 2024-02-14 ENCOUNTER — Telehealth (INDEPENDENT_AMBULATORY_CARE_PROVIDER_SITE_OTHER): Payer: Self-pay

## 2024-02-14 ENCOUNTER — Ambulatory Visit (INDEPENDENT_AMBULATORY_CARE_PROVIDER_SITE_OTHER): Admitting: Vascular Surgery

## 2024-02-14 ENCOUNTER — Other Ambulatory Visit (INDEPENDENT_AMBULATORY_CARE_PROVIDER_SITE_OTHER)

## 2024-02-14 VITALS — BP 106/68 | HR 56 | Resp 18 | Ht 61.0 in | Wt 97.6 lb

## 2024-02-14 DIAGNOSIS — E785 Hyperlipidemia, unspecified: Secondary | ICD-10-CM

## 2024-02-14 DIAGNOSIS — I10 Essential (primary) hypertension: Secondary | ICD-10-CM

## 2024-02-14 DIAGNOSIS — I70219 Atherosclerosis of native arteries of extremities with intermittent claudication, unspecified extremity: Secondary | ICD-10-CM

## 2024-02-14 DIAGNOSIS — E1151 Type 2 diabetes mellitus with diabetic peripheral angiopathy without gangrene: Secondary | ICD-10-CM

## 2024-02-14 NOTE — Telephone Encounter (Signed)
 Patient left a message wanted to make you aware that she has contact her cardiologist Dr Wilburn and has scheduled an appt to discuss sleep study.

## 2024-02-14 NOTE — Progress Notes (Unsigned)
 MRN : [id number]  Yolanda Jimenez is a 78 y.o. (12-Oct-1945) female who presents with chief complaint of  Chief Complaint  Patient presents with   Follow-up    3 month follow up ABI   .  History of Present Illness: Patient returns today in follow up of her PAD.  She is doing fairly well today.  Her biggest complaint currently is of some palpitation and cardiopulmonary symptoms.  I have urged her to contact her cardiologist regarding the symptoms.  She does still have some numbness in her legs and intermittently weakness, but her claudication symptoms are better. Symptoms are markedly improved after extensive revascularization including bilateral femoral endarterectomies and the Endologix stent graft for her aortoiliac disease.  ABIs today are 1.15 on the right and 1.13 on the left with triphasic waveforms.  Current Outpatient Medications  Medication Sig Dispense Refill   acetaminophen  (TYLENOL ) 500 MG tablet Take 1,000 mg by mouth every 6 (six) hours as needed.     aspirin  EC 81 MG tablet Take 1 tablet (81 mg total) by mouth daily. Swallow whole. 150 tablet 2   buPROPion  (WELLBUTRIN ) 75 MG tablet Take 75 mg by mouth every morning.     Cholecalciferol  125 MCG (5000 UT) TABS Take 1 tablet by mouth daily.     clopidogrel  (PLAVIX ) 75 MG tablet Take 1 tablet (75 mg total) by mouth daily. 30 tablet 6   cyanocobalamin  1000 MCG tablet Take 1,000 mcg by mouth daily.     denosumab  (PROLIA ) 60 MG/ML SOSY injection Inject 60 mg into the skin every 6 (six) months.     donepezil  (ARICEPT ) 5 MG tablet Take 1 tablet by mouth at bedtime.     Ferrous Gluconate  239 (27 Fe) MG TABS Take 1 tablet by mouth daily.     fluticasone  (FLONASE ) 50 MCG/ACT nasal spray Place 1 spray into both nostrils daily as needed for allergies or rhinitis.     loperamide  (IMODIUM ) 2 MG capsule Take 2 mg by mouth every morning.     metoprolol  succinate (TOPROL -XL) 25 MG 24 hr tablet Take 1 tablet by mouth daily. Added 1/2 tablet at  night as well     pantoprazole  (PROTONIX ) 40 MG tablet Take 40 mg by mouth daily.     rosuvastatin  (CRESTOR ) 20 MG tablet Take 20 mg by mouth daily.     trospium  (SANCTURA ) 20 MG tablet Take 1 tablet (20 mg total) by mouth at bedtime. 30 tablet 11   No current facility-administered medications for this visit.    Past Medical History:  Diagnosis Date   A-fib [facility])    a.) CHA2DS2-VASc = 5 (age x2, sex, vascular disease history, T2DM) as of 08/23/2023; b.) cardiac rate/rhythm maintained on oral metoprolol  succinate; no chronic OAC   Abnormal vaginal Pap smear 2015   Anxiety    a.) on BZO PRN (diazepam )   Aortic atherosclerosis (HCC)    Aortic stenosis 05/03/2023   a.) TTE 05/03/2023: mild AS (MPG 12 mmHg; AVA = 1.3 cm2)   Arthritis of spine    Asthma    Barrett esophagus    Benign hematuria 08/29/2014   W/u neg  Normal renal ultrasound 2/19  Cystoscopy normal 2019, [place]  CT -9/24     Bilateral carotid artery disease (HCC)    a.) carotid dopplers 10/13/2016, 10/24/2018, 10/26/2019, 10/21/2020, 10/22/2022: 1-39% BICA   C. difficile diarrhea    CAD (coronary artery disease)    a.) CT chest 07/12/2018: LAD calcifications; b.) CT  CAP 11/18/2022: 3 vessel CAD; c.) cPET 05/19/2023: severe 4 vessel coronary calcs mainly in LAD distribution; d.) cPET 05/03/2023: small/mild reversible defect in mid-anterosep/ant region   Complication of anesthesia    a.) delayed emergence; b.) emergence delirium; I get really loopy after surgery   COPD (chronic obstructive pulmonary disease) (HCC)    DDD (degenerative disc disease), lumbosacral    Depression    Diverticulosis    Epigastric hernia    GERD (gastroesophageal reflux disease)    Glaucoma    Heart murmur    Heart palpitations    History of bilateral cataract extraction 2015   History of recurrent UTIs    Hx of adenomatous colonic polyps    Hyperlipidemia    IBS (irritable bowel syndrome)    Insomnia    Iron  deficiency anemia    Kidney  cysts    Long term current use of aspirin     Macular degeneration of left eye    Osteoporosis    a.) on RANKLi (denosumab )   PVD (peripheral vascular disease) with claudication (HCC)    Small bowel arteriovenous malformation 2007   a.) noted on VCE in 2007   Type 2 diabetes, diet controlled (HCC)    Wears dentures    partial lower    Past Surgical History:  Procedure Laterality Date   ABDOMINAL AORTIC ENDOVASCULAR STENT GRAFT N/A 08/24/2023   Procedure: ABDOMINAL AORTIC ENDOVASCULAR STENT GRAFT;  Surgeon: Marea Selinda RAMAN, MD;  Location: [facility] ORS;  Service: Vascular;  Laterality: N/A;   BUNIONECTOMY Right    CATARACT EXTRACTION Bilateral    CERVICAL CONE BIOPSY     CHOLECYSTECTOMY     COLONOSCOPY     COLONOSCOPY WITH PROPOFOL  N/A 01/22/2019   Procedure: COLONOSCOPY WITH BIOPSY;  Surgeon: Jinny Carmine, MD;  Location: [facility];  Service: Endoscopy;  Laterality: N/A;  diabetic - diet controlled   COLONOSCOPY WITH PROPOFOL  N/A 06/27/2020   Procedure: COLONOSCOPY WITH BIOPSY;  Surgeon: Jinny Carmine, MD;  Location: [facility];  Service: Endoscopy;  Laterality: N/A;  priority 4   ENDARTERECTOMY FEMORAL Bilateral 08/24/2023   Procedure: ENDARTERECTOMY FEMORAL;  Surgeon: Marea Selinda RAMAN, MD;  Location: [facility] ORS;  Service: Vascular;  Laterality: Bilateral;   ESOPHAGOGASTRODUODENOSCOPY (EGD) WITH PROPOFOL  N/A 08/29/2017   Procedure: ESOPHAGOGASTRODUODENOSCOPY (EGD) WITH PROPOFOL ;  Surgeon: Jinny Carmine, MD;  Location: [facility];  Service: Endoscopy;  Laterality: N/A;   ESOPHAGOGASTRODUODENOSCOPY (EGD) WITH PROPOFOL  N/A 09/16/2023   Procedure: ESOPHAGOGASTRODUODENOSCOPY (EGD) WITH PROPOFOL ;  Surgeon: Jinny Carmine, MD;  Location: [facility] END[facility]COPY;  Service: Endoscopy;  Laterality: N/A;   FOOT SURGERY     right 2nd toe   HIP ARTHROPLASTY Left 05/16/2018   Procedure: ARTHROPLASTY BIPOLAR HIP (HEMIARTHROPLASTY);  Surgeon: Edie Norleen PARAS, MD;  Location: [facility] OR[facility];  Service:  Orthopedics;  Laterality: Left;   HOT HEMOSTASIS  09/16/2023   Procedure: HOT HEMOSTASIS (ARGON PLASMA COAGULATION/BICAP);  Surgeon: Jinny Carmine, MD;  Location: [facility] END[facility]COPY;  Service: Endoscopy;;   INSERTION OF ILIAC STENT Bilateral 08/24/2023   Procedure: INSERTION OF ILIAC STENT;  Surgeon: Marea Selinda RAMAN, MD;  Location: [facility] OR[facility];  Service: Vascular;  Laterality: Bilateral;   IR BONE MARROW BIOPSY & ASPIRATION  12/09/2023   LOWER EXTREMITY ANGIOGRAPHY Left 04/14/2023   Procedure: Lower Extremity Angiography;  Surgeon: Marea Selinda RAMAN, MD;  Location: [facility] IN[facility]AS[facility];  Service: Cardiovascular;  Laterality: Left;   ORIF PERIPROSTHETIC FRACTURE Left 12/10/2021   Procedure: OPEN REDUCTION INTERNAL FIXATION OF A LEFT GREATER TROCHANTERIC  FRACTURE;  Surgeon: Edie Norleen PARAS, MD;  Location: [facility];  Service: Orthopedics;  Laterality: Left;   ORIF WRIST FRACTURE Right 10/07/2020   Procedure: OPEN REDUCTION INTERNAL FIXATION (ORIF) WRIST FRACTURE;  Surgeon: Kathlynn Sharper, MD;  Location: [facility];  Service: Orthopedics;  Laterality: Right;   POLYPECTOMY N/A 01/22/2019   Procedure: POLYPECTOMY;  Surgeon: Jinny Carmine, MD;  Location: [facility];  Service: Endoscopy;  Laterality: N/A;  2 Clips placed at polyp removal site in ascending colon   POLYPECTOMY N/A 06/27/2020   Procedure: POLYPECTOMY;  Surgeon: Jinny Carmine, MD;  Location: [facility];  Service: Endoscopy;  Laterality: N/A;   TUBAL LIGATION     UPPER GASTROINTESTINAL ENDOSCOPY       Social History   Tobacco Use   Smoking status: Every Day    Current packs/day: 0.50    Average packs/day: 0.5 packs/day for 55.0 years (27.5 ttl pk-yrs)    Types: Cigarettes   Smokeless tobacco: Never   Tobacco comments:    she has patches to start just not started yet. smoked since teenager.  Vaping Use   Vaping status: Never Used  Substance Use Topics   Alcohol use: Yes    Alcohol/week: 2.0 standard drinks of alcohol    Types: 2 Glasses  of wine per week    Comment: occ wine 1-2 weekly   Drug use: No      Family History  Problem Relation Age of Onset   Diabetes Mother    Heart disease Mother    Hypertension Mother    Diabetes Son    Diabetes Sister    Inflammatory bowel disease Father        diverticulitis   Multiple myeloma Father    Hypertension Son    Diabetes Maternal Grandmother    Colon cancer Neg Hx    Esophageal cancer Neg Hx    Rectal cancer Neg Hx    Stomach cancer Neg Hx    Breast cancer Neg Hx      Allergies  Allergen Reactions   Codeine     felt drunk, crazy   Hydrocodone Other (See Comments)   Hydrocodone-Acetaminophen  Other (See Comments)    Confusion/delirium   Lidocaine      Heart racing, increased BP - from local at dentist (likely epi)   Nsaids Other (See Comments)    Avoids due to IBS symptoms (bleeding symptoms)   Oxycodone      Confusion/delirium     REVIEW OF SYSTEMS (Negative unless checked)  Constitutional: [] Weight loss  [] Fever  [] Chills Cardiac: [] Chest pain   [] Chest pressure   [x] Palpitations   [] Shortness of breath when laying flat   [] Shortness of breath at rest   [] Shortness of breath with exertion. Vascular:  [x] Pain in legs with walking   [] Pain in legs at rest   [] Pain in legs when laying flat   [x] Claudication   [] Pain in feet when walking  [] Pain in feet at rest  [] Pain in feet when laying flat   [] History of DVT   [] Phlebitis   [] Swelling in legs   [] Varicose veins   [] Non-healing ulcers Pulmonary:   [] Uses home oxygen   [] Productive cough   [] Hemoptysis   [] Wheeze  [x] COPD   [] Asthma Neurologic:  [] Dizziness  [] Blackouts   [] Seizures   [] History of stroke   [] History of TIA  [] Aphasia   [] Temporary blindness   [] Dysphagia   [] Weakness or numbness in arms   [] Weakness or numbness in legs Musculoskeletal:  [x] Arthritis   []   Joint swelling   [x] Joint pain   [] Low back pain Hematologic:  [] Easy bruising  [] Easy bleeding   [] Hypercoagulable state   [] Anemic    Gastrointestinal:  [] Blood in stool   [] Vomiting blood  [x] Gastroesophageal reflux/heartburn   [] Abdominal pain Genitourinary:  [] Chronic kidney disease   [] Difficult urination  [] Frequent urination  [] Burning with urination   [] Hematuria Skin:  [] Rashes   [] Ulcers   [] Wounds Psychological:  [] History of anxiety   []  History of major depression.  Physical Examination  BP 106/68 (BP Location: Right Arm, Patient Position: Sitting, Cuff Size: Normal)   Pulse (!) 56   Resp 18   Ht 5' 1 (1.549 m)   Wt 97 lb 9.6 oz (44.3 kg)   BMI 18.44 kg/m  Gen:  WD/WN, NAD Head: [place]/AT, No temporalis wasting. Ear/Nose/Throat: Hearing grossly intact, nares w/o erythema or drainage Eyes: Conjunctiva clear. Sclera non-icteric Neck: Supple.  Trachea midline Pulmonary:  Good air movement, no use of accessory muscles.  Cardiac: bradycardic Vascular:  Vessel Right Left  Radial Palpable Palpable                          PT Palpable Palpable  DP Palpable Palpable   Gastrointestinal: soft, non-tender/non-distended. No guarding/reflex.  Musculoskeletal: M/S 5/5 throughout.  No deformity or atrophy. Trace LLE edema. Neurologic: Sensation grossly intact in extremities.  Symmetrical.  Speech is fluent.  Psychiatric: Judgment intact, Mood & affect appropriate for pt's clinical situation. Dermatologic: No rashes or ulcers noted.  No cellulitis or open wounds.      Labs Recent Results (from the past 2160 hours)  Hold Tube- Blood Bank     Status: None   Collection Time: 11/28/23  1:19 PM  Result Value Ref Range   Blood Bank Specimen SAMPLE AVAILABLE FOR TESTING    Sample Expiration      12/01/2023,2359 Performed at [facility], [address] [place], [place] [address]   CBC with Differential (Cancer Center Only)     Status: Abnormal   Collection Time: 11/28/23  1:19 PM  Result Value Ref Range   WBC Count 6.0 4.0 - 10.5 K/uL   RBC 2.56 (L) 3.87 - 5.11 MIL/uL   Hemoglobin 8.6 (L)  12.0 - 15.0 g/dL   HCT 71.3 (L) 63.9 - 53.9 %   MCV 111.7 (H) 80.0 - 100.0 fL   MCH 33.6 26.0 - 34.0 pg   MCHC 30.1 30.0 - 36.0 g/dL   RDW 80.4 (H) 88.4 - 84.4 %   Platelet Count 357 150 - 400 K/uL   nRBC 0.0 0.0 - 0.2 %   Neutrophils Relative % 72 %   Neutro Abs 4.3 1.7 - 7.7 K/uL   Lymphocytes Relative 13 %   Lymphs Abs 0.8 0.7 - 4.0 K/uL   Monocytes Relative 12 %   Monocytes Absolute 0.7 0.1 - 1.0 K/uL   Eosinophils Relative 2 %   Eosinophils Absolute 0.1 0.0 - 0.5 K/uL   Basophils Relative 1 %   Basophils Absolute 0.0 0.0 - 0.1 K/uL   Immature Granulocytes 0 %   Abs Immature Granulocytes 0.02 0.00 - 0.07 K/uL    Comment: Performed at [facility], [address], [place], [place] [address]  Surgical pathology     Status: None   Collection Time: 12/09/23 12:00 AM  Result Value Ref Range   SURGICAL PATHOLOGY      Surgical Pathology CASE: [id number] PATIENT: Aryiana Barreras Bone Marrow  Report     Clinical History: Macrocytic anemia     DIAGNOSIS:  BONE MARROW, ASPIRATE, CLOT, CORE: -  Mildly hypercellular bone marrow with relative erythroid hyperplasia and left shift and mild megakaryocytic hyperplasia with mild dyspoiesis, see note.  PERIPHERAL BLOOD: -  Macrocytic anemia -  Thrombocytosis.  Note: The erythroid series is present in a relative hyperplasia with left shift.  The myeloid series is unremarkable.  There is a mild megakaryocytic hyperplasia with some abnormal lobulation, but insufficient for a diagnosis of dysplasia.  Morphologically the findings suggest possible erythroid compensatory hyperplasia.  It should be noted that while the patient has a history of iron  deficiency anemia the current bone marrow has adequate histiocytic iron  stores.  An underlying/early MDS, however, cannot be completely excluded and pending cytogenetics may be helpful.  If clin ically indicated material is reserved for myeloid NexGen sequencing at the clinicians  discretion.  MICROSCOPIC DESCRIPTION:  PERIPHERAL BLOOD SMEAR: The peripheral blood smear and indices are reviewed revealing a macrocytic anemia and thrombocytosis. Morphologically there is no significant anisopoikilocytosis, other than rare polychromasia.  White blood cells are overall unremarkable.  The platelets show rare clumping, not considered clinically significant, as well as some larger size platelets.  BONE MARROW ASPIRATE: Bone marrow aspirate smear slides contain several cellular bone marrow spicules which are adequate for interpretation. Erythroid precursors: Erythroid series is present in adequate to absolute increased number as well as showing a relative hyperplasia (M: E ratio = 0.88.  There is a mild left shift to the erythroid lineage but with apparent adequate maturation.  Dempsey dyspoiesis is not identified. Granulocytic precursors: The myeloid series is present in  adequate number with a relative hypoplasia.  There is a slight left shift but overall adequate maturation without morphologic evidence of dysgranulopoiesis. Megakaryocytes: The megakaryocytes are present in adequate to overall increased number but overall show normal lobulation. Lymphocytes/plasma cells: Plasma cells and lymphocytes are not increased and are small, round and mature.  TOUCH PREPARATIONS: The touch prep shows similar but suboptimal morphology compared to the aspirate smear slides, refer to above  CLOT AND BIOPSY: The decalcified bone marrow biopsy consists of a heavily fragmented core of trabecular bone and hematopoietic marrow which is adequate for interpretation.  The cellularity is approximately 30% overall which is mildly hyperplastic for age.  There is a mild erythroid hyperplasia with left shift but with otherwise orderly trilineage hematopoiesis.  The clot section does show the mild megakaryocytic hyperplasia identified on the aspirate smear s lides, but that was not as obvious  on the biopsy.  The megakaryocytes are focally clustered and some show separated nuclei and rare hyper lobation but insufficient for diagnosis of dysplasia.  A few random well-circumscribed lymphoid aggregates are present and are interpreted as benign.  IMMUNOHISTOCHEMICAL STAINS: Immunohistochemical stains were performed about the biopsy and clot section with appropriate controls CD34/CD117 highlight blasts and immature precursors which are not significantly increased.  An E-cadherin stain highlights mildly increased immature erythroid precursors, best seen on the clot section.  An MPO stain highlights the myeloid compartment which is overall unremarkable.  CD20 and CD3 were performed on the clot sections given the presence of well-circumscribed lymphoid aggregates and consist of mixed B and T cells which support the morphologic impression of reactive lymphoid aggregates.  IRON  STAIN: Iron  stains are performed on a bone marrow aspira te or touch imprint smear and section of clot. The controls stained appropriately.       Storage Iron : Adequate to  increased histiocytic iron  stores      Ring Sideroblasts: Not identified  ADDITIONAL DATA/TESTING: Conventional cytogenetics are pending and will be reported separately.  Material is reserved for NexGen sequencing as clinically indicated.  CELL COUNT DATA:  Bone Marrow count performed on 500 cells shows: Blasts:   0%   Myeloid:  44% Promyelocytes: 0%   Erythroid:     50% Myelocytes:    7%   Lymphocytes:   3% Metamyelocytes:     3%   Plasma cells:  1% Bands:    7% Neutrophils:   22%  M:E ratio:     0.88 Eosinophils:   5% Basophils:     0% Monocytes:     2%  Lab Data: CBC performed on 12/09/2023 shows: WBC: 6.1 k/uL  Neutrophils:   68% Hgb: 9.5 g/dL  Lymphocytes:   81% HCT: 31.9 %    Monocytes:     8% MCV: 107.4 fL  Eosinophils:   5% RDW: 16.6 %    Basophils:     1% PLT: 451 k/uL    GROSS DESCRIPTION:  A: Aspirate smear  B:  Received in B-pl us  fixative are tissue fragments measuring 2.5 x 1.6 x 0.2 cm in aggregate.  The specimen is submitted in toto.  C: Received in B-plus fixative are 3 cores of bone 0.5 to 1.2 cm in length and each 0.2 cm in diameter.  The specimen is submitted in toto following decalcification.  (GRP 12/09/2023)   Final Diagnosis performed by Mark LeGolvan DO.   Electronically signed 12/16/2023 Technical and / or Professional components performed at [facility], [address], [place], [place] [address].  Immunohistochemistry Technical component (if applicable) was performed at [facility]. [address], [place], [place] [address].   IMMUNOHISTOCHEMISTRY DISCLAIMER (if applicable): Some of these immunohistochemical stains may have been developed and the performance characteristics determine by [facility]. Some may not have been cleared or approved by the U.S. Food and Drug Administration. The FDA has determined that such cle arance or approval is not necessary. This test is used for clinical purposes. It should not be regarded as investigational or for research. This laboratory is certified under the Clinical Laboratory Improvement Amendments of 1988 (CLIA-88) as qualified to perform high complexity clinical laboratory testing.  The controls stained appropriately.   IHC stains are performed on formalin fixed, paraffin embedded tissue using a 3,3diaminobenzidine (DAB) chromogen and Leica Bond Autostainer System. The staining intensity of the nucleus is score manually and is reported as the percentage of tumor cell nuclei demonstrating specific nuclear staining. The specimens are fixed in 10% Neutral Formalin for at least 6 hours and up to 72hrs. These tests are validated on decalcified tissue. Results should be interpreted with caution given the possibility of false negative results on decalcified specimens. Antibody Clones  are as follows ER-clone 109F, PR-clone 16, Ki67- clone MM1. Some of the se immunohistochemical stains may have been developed and the performance characteristics determined by [facility].   CBC with Differential/Platelet     Status: Abnormal   Collection Time: 12/09/23  7:47 AM  Result Value Ref Range   WBC 6.1 4.0 - 10.5 K/uL   RBC 2.97 (L) 3.87 - 5.11 MIL/uL   Hemoglobin 9.5 (L) 12.0 - 15.0 g/dL   HCT 68.0 (L) 63.9 - 53.9 %   MCV 107.4 (H) 80.0 - 100.0 fL   MCH 32.0 26.0 - 34.0 pg   MCHC 29.8 (L)  30.0 - 36.0 g/dL   RDW 83.3 (H) 88.4 - 84.4 %   Platelets 451 (H) 150 - 400 K/uL   nRBC 0.0 0.0 - 0.2 %   Neutrophils Relative % 61 %   Neutro Abs 3.8 1.7 - 7.7 K/uL   Lymphocytes Relative 20 %   Lymphs Abs 1.2 0.7 - 4.0 K/uL   Monocytes Relative 13 %   Monocytes Absolute 0.8 0.1 - 1.0 K/uL   Eosinophils Relative 4 %   Eosinophils Absolute 0.2 0.0 - 0.5 K/uL   Basophils Relative 1 %   Basophils Absolute 0.1 0.0 - 0.1 K/uL   Immature Granulocytes 1 %   Abs Immature Granulocytes 0.03 0.00 - 0.07 K/uL    Comment: Performed at [facility], [address] [place], [place] [address]  Type and screen     Status: None   Collection Time: 12/21/23  8:04 AM  Result Value Ref Range   ABO/RH(D) A POS    Antibody Screen NEG    Sample Expiration 12/24/2023,2359    Unit Number [id number]    Blood Component Type RED CELLS,LR    Unit division 00    Status of Unit ISSUED,FINAL    Transfusion Status OK TO TRANSFUSE    Crossmatch Result COMPATIBLE   BPAM RBC     Status: None   Collection Time: 12/21/23  8:04 AM  Result Value Ref Range   ISSUE DATE / TIME [id number]    Blood Product Unit Number [id number]    PRODUCT CODE [id number]    Unit Type and Rh 6200    Blood Product Expiration Date 797493857640   Iron  and TIBC([facility])     Status: Abnormal   Collection Time: 12/21/23  8:05 AM  Result Value Ref Range   Iron  27 (L) 28 - 170 ug/dL   TIBC 611 749 - 549  ug/dL   Saturation Ratios 7 (L) 10.4 - 31.8 %   UIBC 361 ug/dL    Comment: Performed at [facility], [address] [place], [place] [address]  Ferritin     Status: None   Collection Time: 12/21/23  8:05 AM  Result Value Ref Range   Ferritin 47 11 - 307 ng/mL    Comment: Performed at [facility], [address] [place], [place] [address]  CBC with Differential (Cancer Center Only)     Status: Abnormal   Collection Time: 12/21/23  8:05 AM  Result Value Ref Range   WBC Count 9.1 4.0 - 10.5 K/uL   RBC 2.66 (L) 3.87 - 5.11 MIL/uL   Hemoglobin 8.2 (L) 12.0 - 15.0 g/dL   HCT 72.9 (L) 63.9 - 53.9 %   MCV 101.5 (H) 80.0 - 100.0 fL   MCH 30.8 26.0 - 34.0 pg   MCHC 30.4 30.0 - 36.0 g/dL   RDW 83.6 (H) 88.4 - 84.4 %   Platelet Count 436 (H) 150 - 400 K/uL   nRBC 0.0 0.0 - 0.2 %   Neutrophils Relative % 84 %   Neutro Abs 7.7 1.7 - 7.7 K/uL   Lymphocytes Relative 5 %   Lymphs Abs 0.4 (L) 0.7 - 4.0 K/uL   Monocytes Relative 8 %   Monocytes Absolute 0.7 0.1 - 1.0 K/uL   Eosinophils Relative 2 %   Eosinophils Absolute 0.2 0.0 - 0.5 K/uL   Basophils Relative 1 %   Basophils Absolute 0.1 0.0 - 0.1 K/uL   Immature Granulocytes 0 %   Abs Immature Granulocytes 0.04  0.00 - 0.07 K/uL    Comment: Performed at [facility], [address] [place], [place] [address]  Prepare RBC (crossmatch)     Status: None   Collection Time: 12/21/23 11:59 AM  Result Value Ref Range   Order Confirmation      ORDER PROCESSED BY BLOOD BANK Performed at [facility], [address] [place], [place] [address]   CBC with Differential/Platelet     Status: Abnormal   Collection Time: 01/04/24  9:20 AM  Result Value Ref Range   WBC 8.2 4.0 - 10.5 K/uL   RBC 3.39 (L) 3.87 - 5.11 MIL/uL   Hemoglobin 10.0 (L) 12.0 - 15.0 g/dL   HCT 67.8 (L) 63.9 - 53.9 %   MCV 94.7 80.0 - 100.0 fL   MCH 29.5 26.0 - 34.0 pg   MCHC 31.2 30.0 - 36.0 g/dL   RDW 83.4 (H) 88.4 - 84.4 %    Platelets 456 (H) 150 - 400 K/uL   nRBC 0.0 0.0 - 0.2 %   Neutrophils Relative % 83 %   Neutro Abs 6.8 1.7 - 7.7 K/uL   Lymphocytes Relative 6 %   Lymphs Abs 0.5 (L) 0.7 - 4.0 K/uL   Monocytes Relative 8 %   Monocytes Absolute 0.7 0.1 - 1.0 K/uL   Eosinophils Relative 2 %   Eosinophils Absolute 0.1 0.0 - 0.5 K/uL   Basophils Relative 1 %   Basophils Absolute 0.1 0.0 - 0.1 K/uL   Immature Granulocytes 0 %   Abs Immature Granulocytes 0.02 0.00 - 0.07 K/uL    Comment: Performed at [facility], [address] [place], [place] [address]  Ferritin     Status: None   Collection Time: 01/04/24  9:20 AM  Result Value Ref Range   Ferritin 22 11 - 307 ng/mL    Comment: Performed at [facility], [address] [place], [place] [address]  Iron  and TIBC([facility])     Status: Abnormal   Collection Time: 01/04/24  9:20 AM  Result Value Ref Range   Iron  18 (L) 28 - 170 ug/dL   TIBC 578 749 - 549 ug/dL   Saturation Ratios 4 (L) 10.4 - 31.8 %   UIBC 403 ug/dL    Comment: Performed at [facility], [address] [place], [place] [address]  Hold Tube- Blood Bank     Status: None   Collection Time: 01/04/24  9:20 AM  Result Value Ref Range   Blood Bank Specimen SAMPLE AVAILABLE FOR TESTING    Sample Expiration      01/07/2024,2359 Performed at [facility], [address] [place], [place] [address]   Urinalysis, Complete     Status: Abnormal   Collection Time: 02/03/24  1:17 PM  Result Value Ref Range   Specific Gravity, UA 1.030 1.005 - 1.030   pH, UA 6.0 5.0 - 7.5   Color, UA Yellow Yellow   Appearance Ur Clear Clear   Leukocytes,UA Negative Negative   Protein,UA 1+ (A) Negative/Trace   Glucose, UA Negative Negative   Ketones, UA Trace (A) Negative   RBC, UA 2+ (A) Negative   Bilirubin, UA Negative Negative   Urobilinogen, Ur 0.2 0.2 - 1.0 mg/dL   Nitrite, UA Negative Negative   Microscopic Examination See below:     Comment:  Microscopic was indicated and was performed.  Microscopic Examination     Status: Abnormal   Collection Time: 02/03/24  1:17 PM  Urine  Result Value Ref Range   WBC, UA 0-5 0 - 5 /hpf   RBC, Urine 11-30 (A) 0 - 2 /hpf   Epithelial Cells (non renal) 0-10 0 - 10 /hpf   Crystals Present (A) N/A   Crystal Type Amorphous Sediment N/A   Mucus, UA Present (A) Not Estab.   Bacteria, UA Moderate (A) None seen/Few  Mycoplasma / ureaplasma culture     Status: None   Collection Time: 02/03/24  2:13 PM   Specimen: Genital   UR  Result Value Ref Range   Ureaplasma urealyticum Negative Negative   Mycoplasma hominis Culture Negative Negative    Radiology No results found.  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  Symptoms are markedly improved after extensive revascularization about 6 months ago.  We will now stretch her out to 6 months and follow-up.  She will continue her aspirin , Plavix , and Crestor .  HTN (hypertension) blood pressure control important in reducing the progression of atherosclerotic disease. On appropriate oral medications.     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 Also, involved in wound healing. On appropriate medications.     Hyperlipidemia lipid control important in reducing the progression of atherosclerotic disease. Continue statin therapy  Selinda Gu, MD  02/16/2024 10:09 AM    This note was created with Dragon medical transcription system.  Any errors from dictation are purely unintentional

## 2024-02-16 LAB — VAS US ABI WITH/WO TBI
Left ABI: 1.13
Right ABI: 1.15

## 2024-02-16 NOTE — Assessment & Plan Note (Signed)
 ABIs today are 1.15 on the right and 1.13 on the left with triphasic waveforms.  Symptoms are markedly improved after extensive revascularization about 6 months ago.  We will now stretch her out to 6 months and follow-up.  She will continue her aspirin , Plavix , and Crestor .

## 2024-02-29 ENCOUNTER — Ambulatory Visit: Admitting: Physician Assistant

## 2024-03-08 ENCOUNTER — Ambulatory Visit: Admitting: Urology

## 2024-03-08 VITALS — BP 97/61 | HR 61

## 2024-03-08 DIAGNOSIS — R3129 Other microscopic hematuria: Secondary | ICD-10-CM

## 2024-03-08 NOTE — Progress Notes (Addendum)
   03/08/24  CC:  Chief Complaint  Patient presents with   Cysto    HPI: Refer to Yolanda Jimenez's previous note 02/03/2024.  CT urogram 04/2023 showed no upper tract abnormalities  Blood pressure 97/61, pulse 61. NED. A&Ox3.   No respiratory distress   Abd soft, NT, ND Normal external genitalia with patent urethral meatus  Cystoscopy Procedure Note  Patient identification was confirmed, informed consent was obtained, and patient was prepped using Betadine solution.  Lidocaine  jelly was administered per urethral meatus.    Procedure: - Flexible cystoscope introduced, without any difficulty.   - Thorough search of the bladder revealed:    normal urethral meatus    normal urothelium    no stones    no ulcers     no tumors    no urethral polyps    no trabeculation  - Ureteral orifices were normal in position and appearance.  Post-Procedure: - Patient tolerated the procedure well  Assessment/ Plan:  1.  Microhematuria No bladder mucosal abnormalities on cystoscopy No upper tract abnormalities on CT urogram 54-month follow-up with UA    Yolanda JAYSON Barba, MD

## 2024-03-12 ENCOUNTER — Encounter: Payer: Self-pay | Admitting: Urology

## 2024-04-04 ENCOUNTER — Inpatient Hospital Stay: Attending: Internal Medicine

## 2024-04-04 DIAGNOSIS — D500 Iron deficiency anemia secondary to blood loss (chronic): Secondary | ICD-10-CM

## 2024-04-04 DIAGNOSIS — D509 Iron deficiency anemia, unspecified: Secondary | ICD-10-CM | POA: Diagnosis present

## 2024-04-04 LAB — CBC WITH DIFFERENTIAL (CANCER CENTER ONLY)
Abs Immature Granulocytes: 0.03 K/uL (ref 0.00–0.07)
Basophils Absolute: 0.1 K/uL (ref 0.0–0.1)
Basophils Relative: 1 %
Eosinophils Absolute: 0.1 K/uL (ref 0.0–0.5)
Eosinophils Relative: 1 %
HCT: 26 % — ABNORMAL LOW (ref 36.0–46.0)
Hemoglobin: 7.8 g/dL — ABNORMAL LOW (ref 12.0–15.0)
Immature Granulocytes: 0 %
Lymphocytes Relative: 6 %
Lymphs Abs: 0.5 K/uL — ABNORMAL LOW (ref 0.7–4.0)
MCH: 25.7 pg — ABNORMAL LOW (ref 26.0–34.0)
MCHC: 30 g/dL (ref 30.0–36.0)
MCV: 85.8 fL (ref 80.0–100.0)
Monocytes Absolute: 0.5 K/uL (ref 0.1–1.0)
Monocytes Relative: 6 %
Neutro Abs: 7.2 K/uL (ref 1.7–7.7)
Neutrophils Relative %: 86 %
Platelet Count: 449 K/uL — ABNORMAL HIGH (ref 150–400)
RBC: 3.03 MIL/uL — ABNORMAL LOW (ref 3.87–5.11)
RDW: 17.4 % — ABNORMAL HIGH (ref 11.5–15.5)
WBC Count: 8.4 K/uL (ref 4.0–10.5)
nRBC: 0 % (ref 0.0–0.2)

## 2024-04-04 LAB — IRON AND TIBC
Iron: <10 ug/dL — ABNORMAL LOW (ref 28–170)
TIBC: 405 ug/dL (ref 250–450)

## 2024-04-04 LAB — SAMPLE TO BLOOD BANK

## 2024-04-04 LAB — FERRITIN: Ferritin: 13 ng/mL (ref 11–307)

## 2024-04-05 ENCOUNTER — Inpatient Hospital Stay

## 2024-04-05 ENCOUNTER — Inpatient Hospital Stay (HOSPITAL_BASED_OUTPATIENT_CLINIC_OR_DEPARTMENT_OTHER): Admitting: Oncology

## 2024-04-05 ENCOUNTER — Encounter: Payer: Self-pay | Admitting: Oncology

## 2024-04-05 VITALS — BP 121/74 | HR 50 | Temp 97.6°F | Resp 16 | Ht 61.0 in | Wt 101.0 lb

## 2024-04-05 DIAGNOSIS — D500 Iron deficiency anemia secondary to blood loss (chronic): Secondary | ICD-10-CM

## 2024-04-05 DIAGNOSIS — D509 Iron deficiency anemia, unspecified: Secondary | ICD-10-CM | POA: Diagnosis not present

## 2024-04-05 MED ORDER — IRON SUCROSE 20 MG/ML IV SOLN
200.0000 mg | Freq: Once | INTRAVENOUS | Status: AC
  Administered 2024-04-05: 200 mg via INTRAVENOUS
  Filled 2024-04-05: qty 10

## 2024-04-05 NOTE — Progress Notes (Signed)
 [facility]  [phone] [phone]  ID: Leita DELENA Lesches OB: 1945/11/21  MR#: [id number]  RDW#:[id number]  Patient Care Team: Cleotilde Oneil FALCON, MD as PCP - General (Unknown Physician Specialty) Jacobo Evalene PARAS, MD as Consulting Physician (Hematology and Oncology)  CHIEF COMPLAINT: Iron  deficiency anemia.  INTERVAL HISTORY: Patient returns to clinic today for repeat laboratory, further evaluation, consideration of IV Venofer .  She continues to have chronic fatigue, but otherwise feels well.  She reports she has an appointment with cardiology in the near future to discuss possible pacemaker placement.  She otherwise feels well.  She has no neurologic complaints.  She denies any recent fevers or illnesses.  She has a good appetite and denies weight loss.  She has no chest pain, shortness of breath, cough, or hemoptysis.  She denies any nausea, vomiting, constipation, or diarrhea.  She has noted no melena or hematochezia recently.  She has no urinary complaints.  Patient offers no further specific complaints today.  REVIEW OF SYSTEMS:   Review of Systems  Constitutional:  Positive for malaise/fatigue. Negative for fever and weight loss.  Respiratory: Negative.  Negative for cough, hemoptysis and shortness of breath.   Cardiovascular: Negative.  Negative for chest pain and leg swelling.  Gastrointestinal: Negative.  Negative for abdominal pain, blood in stool and melena.  Genitourinary: Negative.  Negative for dysuria.  Musculoskeletal: Negative.  Negative for back pain.  Skin: Negative.  Negative for rash.  Neurological: Negative.  Negative for dizziness, tingling, focal weakness, weakness and headaches.  Psychiatric/Behavioral: Negative.  The patient is not nervous/anxious.     As per HPI. Otherwise, a complete review of systems is negative.  PAST MEDICAL HISTORY: Past Medical History:  Diagnosis Date   A-fib [facility])    a.) CHA2DS2-VASc = 5 (age x2,  sex, vascular disease history, T2DM) as of 08/23/2023; b.) cardiac rate/rhythm maintained on oral metoprolol  succinate; no chronic OAC   Abnormal vaginal Pap smear 2015   Anxiety    a.) on BZO PRN (diazepam )   Aortic atherosclerosis (HCC)    Aortic stenosis 05/03/2023   a.) TTE 05/03/2023: mild AS (MPG 12 mmHg; AVA = 1.3 cm2)   Arthritis of spine    Asthma    Barrett esophagus    Benign hematuria 08/29/2014   W/u neg  Normal renal ultrasound 2/19  Cystoscopy normal 2019, [place]  CT -9/24     Bilateral carotid artery disease (HCC)    a.) carotid dopplers 10/13/2016, 10/24/2018, 10/26/2019, 10/21/2020, 10/22/2022: 1-39% BICA   C. difficile diarrhea    CAD (coronary artery disease)    a.) CT chest 07/12/2018: LAD calcifications; b.) CT CAP 11/18/2022: 3 vessel CAD; c.) cPET 05/19/2023: severe 4 vessel coronary calcs mainly in LAD distribution; d.) cPET 05/03/2023: small/mild reversible defect in mid-anterosep/ant region   Complication of anesthesia    a.) delayed emergence; b.) emergence delirium; I get really loopy after surgery   COPD (chronic obstructive pulmonary disease) (HCC)    DDD (degenerative disc disease), lumbosacral    Depression    Diverticulosis    Epigastric hernia    GERD (gastroesophageal reflux disease)    Glaucoma    Heart murmur    Heart palpitations    History of bilateral cataract extraction 2015   History of recurrent UTIs    Hx of adenomatous colonic polyps    Hyperlipidemia    IBS (irritable bowel syndrome)    Insomnia    Iron  deficiency anemia    Kidney  cysts    Long term current use of aspirin     Macular degeneration of left eye    Osteoporosis    a.) on RANKLi (denosumab )   PVD (peripheral vascular disease) with claudication (HCC)    Small bowel arteriovenous malformation 2007   a.) noted on VCE in 2007   Type 2 diabetes, diet controlled (HCC)    Wears dentures    partial lower    PAST SURGICAL HISTORY: Past Surgical History:  Procedure  Laterality Date   ABDOMINAL AORTIC ENDOVASCULAR STENT GRAFT N/A 08/24/2023   Procedure: ABDOMINAL AORTIC ENDOVASCULAR STENT GRAFT;  Surgeon: Marea Selinda RAMAN, MD;  Location: [facility];  Service: Vascular;  Laterality: N/A;   BUNIONECTOMY Right    CATARACT EXTRACTION Bilateral    CERVICAL CONE BIOPSY     CHOLECYSTECTOMY     COLONOSCOPY     COLONOSCOPY WITH PROPOFOL  N/A 01/22/2019   Procedure: COLONOSCOPY WITH BIOPSY;  Surgeon: Jinny Carmine, MD;  Location: [facility];  Service: Endoscopy;  Laterality: N/A;  diabetic - diet controlled   COLONOSCOPY WITH PROPOFOL  N/A 06/27/2020   Procedure: COLONOSCOPY WITH BIOPSY;  Surgeon: Jinny Carmine, MD;  Location: [facility];  Service: Endoscopy;  Laterality: N/A;  priority 4   ENDARTERECTOMY FEMORAL Bilateral 08/24/2023   Procedure: ENDARTERECTOMY FEMORAL;  Surgeon: Marea Selinda RAMAN, MD;  Location: [facility];  Service: Vascular;  Laterality: Bilateral;   ESOPHAGOGASTRODUODENOSCOPY (EGD) WITH PROPOFOL  N/A 08/29/2017   Procedure: ESOPHAGOGASTRODUODENOSCOPY (EGD) WITH PROPOFOL ;  Surgeon: Jinny Carmine, MD;  Location: [facility];  Service: Endoscopy;  Laterality: N/A;   ESOPHAGOGASTRODUODENOSCOPY (EGD) WITH PROPOFOL  N/A 09/16/2023   Procedure: ESOPHAGOGASTRODUODENOSCOPY (EGD) WITH PROPOFOL ;  Surgeon: Jinny Carmine, MD;  Location: [facility];  Service: Endoscopy;  Laterality: N/A;   FOOT SURGERY     right 2nd toe   HIP ARTHROPLASTY Left 05/16/2018   Procedure: ARTHROPLASTY BIPOLAR HIP (HEMIARTHROPLASTY);  Surgeon: Edie Norleen PARAS, MD;  Location: [facility];  Service: Orthopedics;  Laterality: Left;   HOT HEMOSTASIS  09/16/2023   Procedure: HOT HEMOSTASIS (ARGON PLASMA COAGULATION/BICAP);  Surgeon: Jinny Carmine, MD;  Location: [facility];  Service: Endoscopy;;   INSERTION OF ILIAC STENT Bilateral 08/24/2023   Procedure: INSERTION OF ILIAC STENT;  Surgeon: Marea Selinda RAMAN, MD;  Location: [facility];  Service: Vascular;  Laterality: Bilateral;   IR BONE  MARROW BIOPSY & ASPIRATION  12/09/2023   LOWER EXTREMITY ANGIOGRAPHY Left 04/14/2023   Procedure: Lower Extremity Angiography;  Surgeon: Marea Selinda RAMAN, MD;  Location: [facility];  Service: Cardiovascular;  Laterality: Left;   ORIF PERIPROSTHETIC FRACTURE Left 12/10/2021   Procedure: OPEN REDUCTION INTERNAL FIXATION OF A LEFT GREATER TROCHANTERIC FRACTURE;  Surgeon: Edie Norleen PARAS, MD;  Location: [facility];  Service: Orthopedics;  Laterality: Left;   ORIF WRIST FRACTURE Right 10/07/2020   Procedure: OPEN REDUCTION INTERNAL FIXATION (ORIF) WRIST FRACTURE;  Surgeon: Kathlynn Sharper, MD;  Location: [facility];  Service: Orthopedics;  Laterality: Right;   POLYPECTOMY N/A 01/22/2019   Procedure: POLYPECTOMY;  Surgeon: Jinny Carmine, MD;  Location: [facility];  Service: Endoscopy;  Laterality: N/A;  2 Clips placed at polyp removal site in ascending colon   POLYPECTOMY N/A 06/27/2020   Procedure: POLYPECTOMY;  Surgeon: Jinny Carmine, MD;  Location: [facility];  Service: Endoscopy;  Laterality: N/A;   TUBAL LIGATION     UPPER GASTROINTESTINAL ENDOSCOPY      FAMILY HISTORY: Family History  Problem Relation Age of Onset   Diabetes Mother  Heart disease Mother    Hypertension Mother    Diabetes Son    Diabetes Sister    Inflammatory bowel disease Father        diverticulitis   Multiple myeloma Father    Hypertension Son    Diabetes Maternal Grandmother    Colon cancer Neg Hx    Esophageal cancer Neg Hx    Rectal cancer Neg Hx    Stomach cancer Neg Hx    Breast cancer Neg Hx     ADVANCED DIRECTIVES (Y/N):  N  HEALTH MAINTENANCE: Social History   Tobacco Use   Smoking status: Every Day    Current packs/day: 0.50    Average packs/day: 0.5 packs/day for 55.0 years (27.5 ttl pk-yrs)    Types: Cigarettes   Smokeless tobacco: Never   Tobacco comments:    she has patches to start just not started yet. smoked since teenager.  Vaping Use   Vaping status: Never Used   Substance Use Topics   Alcohol use: Yes    Alcohol/week: 2.0 standard drinks of alcohol    Types: 2 Glasses of wine per week    Comment: occ wine 1-2 weekly   Drug use: No     Colonoscopy:  PAP:  Bone density:  Lipid panel:  Allergies  Allergen Reactions   Codeine     felt drunk, crazy   Hydrocodone Other (See Comments)   Hydrocodone-Acetaminophen  Other (See Comments)    Confusion/delirium   Lidocaine      Heart racing, increased BP - from local at dentist (likely epi)   Nsaids Other (See Comments)    Avoids due to IBS symptoms (bleeding symptoms)   Oxycodone      Confusion/delirium    Current Outpatient Medications  Medication Sig Dispense Refill   acetaminophen  (TYLENOL ) 500 MG tablet Take 1,000 mg by mouth every 6 (six) hours as needed.     aspirin  EC 81 MG tablet Take 1 tablet (81 mg total) by mouth daily. Swallow whole. 150 tablet 2   buPROPion  (WELLBUTRIN ) 75 MG tablet Take 75 mg by mouth every morning.     ciprofloxacin  (CIPRO ) 500 MG tablet Take 500 mg by mouth 2 (two) times daily.     clopidogrel  (PLAVIX ) 75 MG tablet Take 1 tablet (75 mg total) by mouth daily. 30 tablet 6   denosumab  (PROLIA ) 60 MG/ML SOSY injection Inject 60 mg into the skin every 6 (six) months.     diazepam  (VALIUM ) 5 MG tablet Take 5 mg by mouth 2 (two) times daily as needed.     diltiazem  (CARDIZEM  CD) 120 MG 24 hr capsule Take 120 mg by mouth daily.     fluticasone  (FLONASE ) 50 MCG/ACT nasal spray Place 1 spray into both nostrils daily as needed for allergies or rhinitis.     loperamide  (IMODIUM ) 2 MG capsule Take 2 mg by mouth every morning.     memantine (NAMENDA) 5 MG tablet Take 5 mg by mouth 2 (two) times daily.     metoprolol  succinate (TOPROL -XL) 25 MG 24 hr tablet Take 1 tablet by mouth daily. Added 1/2 tablet at night as well     pantoprazole  (PROTONIX ) 40 MG tablet Take 40 mg by mouth daily.     sertraline  (ZOLOFT ) 50 MG tablet Take 50 mg by mouth daily.     trospium  (SANCTURA )  20 MG tablet Take 1 tablet (20 mg total) by mouth at bedtime. 30 tablet 11   Cholecalciferol  125 MCG (5000 UT) TABS Take 1 tablet  by mouth daily. (Patient not taking: Reported on 04/05/2024)     cyanocobalamin  1000 MCG tablet Take 1,000 mcg by mouth daily. (Patient not taking: Reported on 04/05/2024)     donepezil  (ARICEPT ) 5 MG tablet Take 1 tablet by mouth at bedtime. (Patient not taking: Reported on 04/05/2024)     Ferrous Gluconate  239 (27 Fe) MG TABS Take 1 tablet by mouth daily. (Patient not taking: Reported on 04/05/2024)     rosuvastatin  (CRESTOR ) 20 MG tablet Take 20 mg by mouth daily. (Patient not taking: Reported on 04/05/2024)     No current facility-administered medications for this visit.    OBJECTIVE: Vitals:   04/05/24 1009  BP: 121/74  Pulse: (!) 50  Resp: 16  Temp: 97.6 F (36.4 C)  SpO2: 99%     Body mass index is 19.08 kg/m.    ECOG FS:0 - Asymptomatic  General: Well-developed, well-nourished, no acute distress. Eyes: Pink conjunctiva, anicteric sclera. HEENT: Normocephalic, moist mucous membranes. Lungs: No audible wheezing or coughing. Heart: Regular rate and rhythm. Abdomen: Soft, nontender, no obvious distention. Musculoskeletal: No edema, cyanosis, or clubbing. Neuro: Alert, answering all questions appropriately. Cranial nerves grossly intact. Skin: No rashes or petechiae noted. Psych: Normal affect.  LAB RESULTS:  Lab Results  Component Value Date   NA 140 09/17/2023   K 4.1 09/17/2023   CL 109 09/17/2023   CO2 24 09/17/2023   GLUCOSE 110 (H) 09/17/2023   BUN 15 09/17/2023   CREATININE 0.70 09/17/2023   CALCIUM  8.4 (L) 09/17/2023   PROT 5.3 (L) 09/17/2023   ALBUMIN  3.1 (L) 09/17/2023   AST 15 09/17/2023   ALT 9 09/17/2023   ALKPHOS 37 (L) 09/17/2023   BILITOT 0.5 11/02/2023   GFRNONAA >60 09/17/2023   GFRAA >60 05/18/2018    Lab Results  Component Value Date   WBC 8.4 04/04/2024   NEUTROABS 7.2 04/04/2024   HGB 7.8 (L) 04/04/2024   HCT  26.0 (L) 04/04/2024   MCV 85.8 04/04/2024   PLT 449 (H) 04/04/2024   Lab Results  Component Value Date   IRON  <10 (L) 04/04/2024   TIBC 405 04/04/2024   IRONPCTSAT NOT CALCULATED 04/04/2024   Lab Results  Component Value Date   FERRITIN 13 04/04/2024     STUDIES: No results found.   ASSESSMENT: Iron  deficiency anemia.  PLAN:    Iron  deficiency anemia: Likely secondary to GI bleed.  Patient underwent EGD on September 16, 2023 that revealed multiple angiodysplastic lesions with recent stigmata of bleeding throughout the entire duodenum.  These were subsequently treated with argon plasma coagulation.  Recommendation at that time was to repeat endoscopy in 6 months when patient was able to hold Plavix .  She also underwent bone marrow biopsy on Dec 09, 2023 that only revealed a hypercellular bone marrow with relative relative erythroid hyperplasia suggestive of compensatory hyperplasia.  Myeloid series was reported as unremarkable.  Cytogenetics was normal as well.  Patient continues to have decreased hemoglobin and iron  stores, therefore proceed with 200 mg IV Venofer  today.  Return to clinic 4 times over the next 1 to 2 weeks for additional treatment.  Patient will then return to clinic in 3 months with repeat laboratory work, further evaluation, and consideration of additional treatment if necessary.  A referral has also been sent back to GI for further evaluation.   GI bleed: Patient will require follow-up with GI in the near future. Cardiology: Continue follow-up with cardiology as scheduled.  Patient reports she likely will  require pacemaker in the near future.    I spent a total of 30 minutes reviewing chart data, face-to-face evaluation with the patient, counseling and coordination of care as detailed above.   Patient expressed understanding and was in agreement with this plan. She also understands that She can call clinic at any time with any questions, concerns, or complaints.     Evalene JINNY Reusing, MD   04/05/2024 4:40 PM

## 2024-04-05 NOTE — Progress Notes (Signed)
 Patient is needing a Pacemaker. She is having rapid heart rate. Shortness of breath, pain in her left leg, along with numbness.

## 2024-04-05 NOTE — Patient Instructions (Signed)

## 2024-04-12 ENCOUNTER — Inpatient Hospital Stay: Attending: Internal Medicine

## 2024-04-12 VITALS — BP 119/54 | HR 47 | Temp 96.2°F | Resp 18

## 2024-04-12 DIAGNOSIS — D500 Iron deficiency anemia secondary to blood loss (chronic): Secondary | ICD-10-CM

## 2024-04-12 DIAGNOSIS — D509 Iron deficiency anemia, unspecified: Secondary | ICD-10-CM | POA: Diagnosis present

## 2024-04-12 MED ORDER — IRON SUCROSE 20 MG/ML IV SOLN
200.0000 mg | Freq: Once | INTRAVENOUS | Status: AC
  Administered 2024-04-12: 200 mg via INTRAVENOUS
  Filled 2024-04-12: qty 10

## 2024-04-12 MED ORDER — SODIUM CHLORIDE 0.9% FLUSH
10.0000 mL | Freq: Once | INTRAVENOUS | Status: AC | PRN
  Administered 2024-04-12: 10 mL
  Filled 2024-04-12: qty 10

## 2024-04-16 ENCOUNTER — Inpatient Hospital Stay

## 2024-04-16 VITALS — BP 130/58 | HR 43 | Temp 97.1°F

## 2024-04-16 DIAGNOSIS — D500 Iron deficiency anemia secondary to blood loss (chronic): Secondary | ICD-10-CM

## 2024-04-16 DIAGNOSIS — D509 Iron deficiency anemia, unspecified: Secondary | ICD-10-CM | POA: Diagnosis not present

## 2024-04-16 MED ORDER — SODIUM CHLORIDE 0.9% FLUSH
10.0000 mL | Freq: Once | INTRAVENOUS | Status: AC | PRN
  Administered 2024-04-16: 10 mL
  Filled 2024-04-16: qty 10

## 2024-04-16 MED ORDER — IRON SUCROSE 20 MG/ML IV SOLN
200.0000 mg | Freq: Once | INTRAVENOUS | Status: AC
  Administered 2024-04-16: 200 mg via INTRAVENOUS
  Filled 2024-04-16: qty 10

## 2024-04-16 NOTE — Patient Instructions (Signed)

## 2024-04-16 NOTE — Progress Notes (Signed)
 Patient tolerated Venofer  infusion well. Explained recommendation of 30 min post monitoring. Patient refused to wait post monitoring. Educated on what signs to watch for & to call with any concerns. No questions, discharged. Stable

## 2024-04-18 ENCOUNTER — Inpatient Hospital Stay

## 2024-04-18 VITALS — BP 109/76 | HR 51 | Temp 97.8°F | Resp 18

## 2024-04-18 DIAGNOSIS — D500 Iron deficiency anemia secondary to blood loss (chronic): Secondary | ICD-10-CM

## 2024-04-18 DIAGNOSIS — D509 Iron deficiency anemia, unspecified: Secondary | ICD-10-CM | POA: Diagnosis not present

## 2024-04-18 MED ORDER — SODIUM CHLORIDE 0.9% FLUSH
10.0000 mL | Freq: Once | INTRAVENOUS | Status: AC | PRN
  Administered 2024-04-18: 10 mL
  Filled 2024-04-18: qty 10

## 2024-04-18 MED ORDER — IRON SUCROSE 20 MG/ML IV SOLN
200.0000 mg | Freq: Once | INTRAVENOUS | Status: AC
  Administered 2024-04-18: 200 mg via INTRAVENOUS
  Filled 2024-04-18: qty 10

## 2024-04-20 ENCOUNTER — Inpatient Hospital Stay

## 2024-04-20 VITALS — BP 150/61 | HR 50 | Resp 17

## 2024-04-20 DIAGNOSIS — D500 Iron deficiency anemia secondary to blood loss (chronic): Secondary | ICD-10-CM

## 2024-04-20 DIAGNOSIS — D509 Iron deficiency anemia, unspecified: Secondary | ICD-10-CM | POA: Diagnosis not present

## 2024-04-20 MED ORDER — IRON SUCROSE 20 MG/ML IV SOLN
200.0000 mg | Freq: Once | INTRAVENOUS | Status: AC
  Administered 2024-04-20: 200 mg via INTRAVENOUS
  Filled 2024-04-20: qty 10

## 2024-04-20 MED ORDER — SODIUM CHLORIDE 0.9% FLUSH
10.0000 mL | Freq: Once | INTRAVENOUS | Status: AC | PRN
  Administered 2024-04-20: 10 mL
  Filled 2024-04-20: qty 10

## 2024-04-20 NOTE — Patient Instructions (Signed)

## 2024-04-20 NOTE — Progress Notes (Signed)
 Patient tolerated Venofer  infusion well. Explained recommendation of 30 min post monitoring. Patient refused to wait post monitoring. Educated on what signs to watch for & to call with any concerns. No questions, discharged. Stable

## 2024-04-30 ENCOUNTER — Encounter: Payer: Self-pay | Admitting: Physician Assistant

## 2024-07-10 ENCOUNTER — Inpatient Hospital Stay: Attending: Internal Medicine

## 2024-07-10 ENCOUNTER — Inpatient Hospital Stay: Admitting: Oncology

## 2024-07-10 ENCOUNTER — Inpatient Hospital Stay

## 2024-07-10 ENCOUNTER — Encounter: Payer: Self-pay | Admitting: Oncology

## 2024-07-10 VITALS — BP 121/75 | HR 88 | Temp 97.2°F | Resp 18 | Ht 61.0 in | Wt 95.2 lb

## 2024-07-10 DIAGNOSIS — D500 Iron deficiency anemia secondary to blood loss (chronic): Secondary | ICD-10-CM

## 2024-07-10 DIAGNOSIS — D509 Iron deficiency anemia, unspecified: Secondary | ICD-10-CM | POA: Diagnosis present

## 2024-07-10 LAB — CBC WITH DIFFERENTIAL/PLATELET
Abs Immature Granulocytes: 0.05 K/uL (ref 0.00–0.07)
Basophils Absolute: 0.1 K/uL (ref 0.0–0.1)
Basophils Relative: 1 %
Eosinophils Absolute: 0.1 K/uL (ref 0.0–0.5)
Eosinophils Relative: 1 %
HCT: 38 % (ref 36.0–46.0)
Hemoglobin: 12.1 g/dL (ref 12.0–15.0)
Immature Granulocytes: 1 %
Lymphocytes Relative: 14 %
Lymphs Abs: 1.2 K/uL (ref 0.7–4.0)
MCH: 30.1 pg (ref 26.0–34.0)
MCHC: 31.8 g/dL (ref 30.0–36.0)
MCV: 94.5 fL (ref 80.0–100.0)
Monocytes Absolute: 0.7 K/uL (ref 0.1–1.0)
Monocytes Relative: 8 %
Neutro Abs: 6.5 K/uL (ref 1.7–7.7)
Neutrophils Relative %: 75 %
Platelets: 403 K/uL — ABNORMAL HIGH (ref 150–400)
RBC: 4.02 MIL/uL (ref 3.87–5.11)
RDW: 19.9 % — ABNORMAL HIGH (ref 11.5–15.5)
WBC: 8.5 K/uL (ref 4.0–10.5)
nRBC: 0 % (ref 0.0–0.2)

## 2024-07-10 LAB — IRON AND TIBC
Iron: 34 ug/dL (ref 28–170)
Saturation Ratios: 9 % — ABNORMAL LOW (ref 10.4–31.8)
TIBC: 368 ug/dL (ref 250–450)
UIBC: 335 ug/dL

## 2024-07-10 LAB — FERRITIN: Ferritin: 72 ng/mL (ref 11–307)

## 2024-07-10 MED ORDER — IRON SUCROSE 20 MG/ML IV SOLN
200.0000 mg | Freq: Once | INTRAVENOUS | Status: AC
  Administered 2024-07-10: 200 mg via INTRAVENOUS
  Filled 2024-07-10: qty 10

## 2024-07-10 NOTE — Progress Notes (Unsigned)
 Patient doesn't understand why she has to keep coming here?

## 2024-07-10 NOTE — Progress Notes (Unsigned)
 [facility]  Telephone:(336) [phone] Fax:(336) [phone]  ID: Yolanda Jimenez OB: Aug 18, 1945  MR#: [id number]  RDW#:[id number]  Patient Care Team: Cleotilde Oneil FALCON, MD as PCP - General (Unknown Physician Specialty) Jacobo Evalene PARAS, MD as Consulting Physician (Hematology and Oncology)  CHIEF COMPLAINT: Iron  deficiency anemia.  INTERVAL HISTORY: Patient returns to clinic today for repeat laboratory, further evaluation, consideration of IV Venofer .  She continues to have chronic fatigue, but otherwise feels well.  She reports she has an appointment with cardiology in the near future to discuss possible pacemaker placement.  She otherwise feels well.  She has no neurologic complaints.  She denies any recent fevers or illnesses.  She has a good appetite and denies weight loss.  She has no chest pain, shortness of breath, cough, or hemoptysis.  She denies any nausea, vomiting, constipation, or diarrhea.  She has noted no melena or hematochezia recently.  She has no urinary complaints.  Patient offers no further specific complaints today.  REVIEW OF SYSTEMS:   Review of Systems  Constitutional:  Positive for malaise/fatigue. Negative for fever and weight loss.  Respiratory: Negative.  Negative for cough, hemoptysis and shortness of breath.   Cardiovascular: Negative.  Negative for chest pain and leg swelling.  Gastrointestinal: Negative.  Negative for abdominal pain, blood in stool and melena.  Genitourinary: Negative.  Negative for dysuria.  Musculoskeletal: Negative.  Negative for back pain.  Skin: Negative.  Negative for rash.  Neurological: Negative.  Negative for dizziness, tingling, focal weakness, weakness and headaches.  Psychiatric/Behavioral: Negative.  The patient is not nervous/anxious.     As per HPI. Otherwise, a complete review of systems is negative.  PAST MEDICAL HISTORY: Past Medical History:  Diagnosis Date   A-fib [facility])    a.) CHA2DS2-VASc = 5 (age x2,  sex, vascular disease history, T2DM) as of 08/23/2023; b.) cardiac rate/rhythm maintained on oral metoprolol  succinate; no chronic OAC   Abnormal vaginal Pap smear 2015   Anxiety    a.) on BZO PRN (diazepam )   Aortic atherosclerosis    Aortic stenosis 05/03/2023   a.) TTE 05/03/2023: mild AS (MPG 12 mmHg; AVA = 1.3 cm2)   Arthritis of spine    Asthma    Barrett esophagus    Benign hematuria 08/29/2014   W/u neg  Normal renal ultrasound 2/19  Cystoscopy normal 2019, [place]  CT -9/24     Bilateral carotid artery disease    a.) carotid dopplers 10/13/2016, 10/24/2018, 10/26/2019, 10/21/2020, 10/22/2022: 1-39% BICA   C. difficile diarrhea    CAD (coronary artery disease)    a.) CT chest 07/12/2018: LAD calcifications; b.) CT CAP 11/18/2022: 3 vessel CAD; c.) cPET 05/19/2023: severe 4 vessel coronary calcs mainly in LAD distribution; d.) cPET 05/03/2023: small/mild reversible defect in mid-anterosep/ant region   Complication of anesthesia    a.) delayed emergence; b.) emergence delirium; I get really loopy after surgery   COPD (chronic obstructive pulmonary disease) (HCC)    DDD (degenerative disc disease), lumbosacral    Depression    Diverticulosis    Epigastric hernia    GERD (gastroesophageal reflux disease)    Glaucoma    Heart murmur    Heart palpitations    History of bilateral cataract extraction 2015   History of recurrent UTIs    Hx of adenomatous colonic polyps    Hyperlipidemia    IBS (irritable bowel syndrome)    Insomnia    Iron  deficiency anemia    Kidney cysts  Long term current use of aspirin     Macular degeneration of left eye    Osteoporosis    a.) on RANKLi (denosumab )   PVD (peripheral vascular disease) with claudication    Small bowel arteriovenous malformation 2007   a.) noted on VCE in 2007   Type 2 diabetes, diet controlled (HCC)    Wears dentures    partial lower    PAST SURGICAL HISTORY: Past Surgical History:  Procedure Laterality Date    ABDOMINAL AORTIC ENDOVASCULAR STENT GRAFT N/A 08/24/2023   Procedure: ABDOMINAL AORTIC ENDOVASCULAR STENT GRAFT;  Surgeon: Marea Selinda RAMAN, MD;  Location: [facility] OR[facility];  Service: Vascular;  Laterality: N/A;   BUNIONECTOMY Right    CATARACT EXTRACTION Bilateral    CERVICAL CONE BIOPSY     CHOLECYSTECTOMY     COLONOSCOPY     COLONOSCOPY WITH PROPOFOL  N/A 01/22/2019   Procedure: COLONOSCOPY WITH BIOPSY;  Surgeon: Jinny Carmine, MD;  Location: [facility];  Service: Endoscopy;  Laterality: N/A;  diabetic - diet controlled   COLONOSCOPY WITH PROPOFOL  N/A 06/27/2020   Procedure: COLONOSCOPY WITH BIOPSY;  Surgeon: Jinny Carmine, MD;  Location: [facility];  Service: Endoscopy;  Laterality: N/A;  priority 4   ENDARTERECTOMY FEMORAL Bilateral 08/24/2023   Procedure: ENDARTERECTOMY FEMORAL;  Surgeon: Marea Selinda RAMAN, MD;  Location: [facility];  Service: Vascular;  Laterality: Bilateral;   ESOPHAGOGASTRODUODENOSCOPY (EGD) WITH PROPOFOL  N/A 08/29/2017   Procedure: ESOPHAGOGASTRODUODENOSCOPY (EGD) WITH PROPOFOL ;  Surgeon: Jinny Carmine, MD;  Location: [facility];  Service: Endoscopy;  Laterality: N/A;   ESOPHAGOGASTRODUODENOSCOPY (EGD) WITH PROPOFOL  N/A 09/16/2023   Procedure: ESOPHAGOGASTRODUODENOSCOPY (EGD) WITH PROPOFOL ;  Surgeon: Jinny Carmine, MD;  Location: [facility];  Service: Endoscopy;  Laterality: N/A;   FOOT SURGERY     right 2nd toe   HIP ARTHROPLASTY Left 05/16/2018   Procedure: ARTHROPLASTY BIPOLAR HIP (HEMIARTHROPLASTY);  Surgeon: Edie Norleen PARAS, MD;  Location: [facility];  Service: Orthopedics;  Laterality: Left;   HOT HEMOSTASIS  09/16/2023   Procedure: HOT HEMOSTASIS (ARGON PLASMA COAGULATION/BICAP);  Surgeon: Jinny Carmine, MD;  Location: [facility];  Service: Endoscopy;;   INSERTION OF ILIAC STENT Bilateral 08/24/2023   Procedure: INSERTION OF ILIAC STENT;  Surgeon: Marea Selinda RAMAN, MD;  Location: [facility];  Service: Vascular;  Laterality: Bilateral;   IR BONE MARROW BIOPSY &  ASPIRATION  12/09/2023   LOWER EXTREMITY ANGIOGRAPHY Left 04/14/2023   Procedure: Lower Extremity Angiography;  Surgeon: Marea Selinda RAMAN, MD;  Location: [facility];  Service: Cardiovascular;  Laterality: Left;   ORIF PERIPROSTHETIC FRACTURE Left 12/10/2021   Procedure: OPEN REDUCTION INTERNAL FIXATION OF A LEFT GREATER TROCHANTERIC FRACTURE;  Surgeon: Edie Norleen PARAS, MD;  Location: [facility];  Service: Orthopedics;  Laterality: Left;   ORIF WRIST FRACTURE Right 10/07/2020   Procedure: OPEN REDUCTION INTERNAL FIXATION (ORIF) WRIST FRACTURE;  Surgeon: Kathlynn Sharper, MD;  Location: [facility];  Service: Orthopedics;  Laterality: Right;   POLYPECTOMY N/A 01/22/2019   Procedure: POLYPECTOMY;  Surgeon: Jinny Carmine, MD;  Location: [facility];  Service: Endoscopy;  Laterality: N/A;  2 Clips placed at polyp removal site in ascending colon   POLYPECTOMY N/A 06/27/2020   Procedure: POLYPECTOMY;  Surgeon: Jinny Carmine, MD;  Location: [facility];  Service: Endoscopy;  Laterality: N/A;   TUBAL LIGATION     UPPER GASTROINTESTINAL ENDOSCOPY      FAMILY HISTORY: Family History  Problem Relation Age of Onset   Diabetes Mother    Heart disease Mother  Hypertension Mother    Diabetes Son    Diabetes Sister    Inflammatory bowel disease Father        diverticulitis   Multiple myeloma Father    Hypertension Son    Diabetes Maternal Grandmother    Colon cancer Neg Hx    Esophageal cancer Neg Hx    Rectal cancer Neg Hx    Stomach cancer Neg Hx    Breast cancer Neg Hx     ADVANCED DIRECTIVES (Y/N):  N  HEALTH MAINTENANCE: Social History   Tobacco Use   Smoking status: Every Day    Current packs/day: 0.50    Average packs/day: 0.5 packs/day for 55.0 years (27.5 ttl pk-yrs)    Types: Cigarettes   Smokeless tobacco: Never   Tobacco comments:    she has patches to start just not started yet. smoked since teenager.  Vaping Use   Vaping status: Never Used  Substance Use  Topics   Alcohol use: Yes    Alcohol/week: 2.0 standard drinks of alcohol    Types: 2 Glasses of wine per week    Comment: occ wine 1-2 weekly   Drug use: No     Colonoscopy:  PAP:  Bone density:  Lipid panel:  Allergies  Allergen Reactions   Codeine     felt drunk, crazy   Hydrocodone Other (See Comments)   Hydrocodone-Acetaminophen  Other (See Comments)    Confusion/delirium   Lidocaine      Heart racing, increased BP - from local at dentist (likely epi)   Nsaids Other (See Comments)    Avoids due to IBS symptoms (bleeding symptoms)   Oxycodone      Confusion/delirium    Current Outpatient Medications  Medication Sig Dispense Refill   acetaminophen  (TYLENOL ) 500 MG tablet Take 1,000 mg by mouth every 6 (six) hours as needed.     buPROPion  (WELLBUTRIN ) 75 MG tablet Take 75 mg by mouth every morning.     Cholecalciferol  125 MCG (5000 UT) TABS Take 1 tablet by mouth daily.     ciprofloxacin  (CIPRO ) 500 MG tablet Take 500 mg by mouth 2 (two) times daily.     clopidogrel  (PLAVIX ) 75 MG tablet Take 1 tablet (75 mg total) by mouth daily. 30 tablet 6   cyanocobalamin  1000 MCG tablet Take 1,000 mcg by mouth daily.     denosumab  (PROLIA ) 60 MG/ML SOSY injection Inject 60 mg into the skin every 6 (six) months.     diazepam  (VALIUM ) 5 MG tablet Take 5 mg by mouth 2 (two) times daily as needed.     diltiazem  (CARDIZEM  CD) 120 MG 24 hr capsule Take 120 mg by mouth daily.     donepezil  (ARICEPT ) 5 MG tablet Take 1 tablet by mouth at bedtime.     Ferrous Gluconate  239 (27 Fe) MG TABS Take 1 tablet by mouth daily.     fluticasone  (FLONASE ) 50 MCG/ACT nasal spray Place 1 spray into both nostrils daily as needed for allergies or rhinitis.     loperamide  (IMODIUM ) 2 MG capsule Take 2 mg by mouth every morning.     memantine (NAMENDA) 5 MG tablet Take 5 mg by mouth 2 (two) times daily.     metoprolol  succinate (TOPROL -XL) 25 MG 24 hr tablet Take 1 tablet by mouth daily. Added 1/2 tablet at  night as well     pantoprazole  (PROTONIX ) 40 MG tablet Take 40 mg by mouth daily.     sertraline  (ZOLOFT ) 50 MG tablet Take 50 mg  by mouth daily.     trospium  (SANCTURA ) 20 MG tablet Take 1 tablet (20 mg total) by mouth at bedtime. 30 tablet 11   rosuvastatin  (CRESTOR ) 20 MG tablet Take 20 mg by mouth daily. (Patient not taking: Reported on 07/10/2024)     No current facility-administered medications for this visit.    OBJECTIVE: Vitals:   07/10/24 1308  BP: 121/75  Pulse: 88  Resp: 18  Temp: (!) 97.2 F (36.2 C)  SpO2: 99%     Body mass index is 17.99 kg/m.    ECOG FS:0 - Asymptomatic  General: Well-developed, well-nourished, no acute distress. Eyes: Pink conjunctiva, anicteric sclera. HEENT: Normocephalic, moist mucous membranes. Lungs: No audible wheezing or coughing. Heart: Regular rate and rhythm. Abdomen: Soft, nontender, no obvious distention. Musculoskeletal: No edema, cyanosis, or clubbing. Neuro: Alert, answering all questions appropriately. Cranial nerves grossly intact. Skin: No rashes or petechiae noted. Psych: Normal affect.  LAB RESULTS:  Lab Results  Component Value Date   NA 140 09/17/2023   K 4.1 09/17/2023   CL 109 09/17/2023   CO2 24 09/17/2023   GLUCOSE 110 (H) 09/17/2023   BUN 15 09/17/2023   CREATININE 0.70 09/17/2023   CALCIUM  8.4 (L) 09/17/2023   PROT 5.3 (L) 09/17/2023   ALBUMIN  3.1 (L) 09/17/2023   AST 15 09/17/2023   ALT 9 09/17/2023   ALKPHOS 37 (L) 09/17/2023   BILITOT 0.5 11/02/2023   GFRNONAA >60 09/17/2023   GFRAA >60 05/18/2018    Lab Results  Component Value Date   WBC 8.5 07/10/2024   NEUTROABS 6.5 07/10/2024   HGB 12.1 07/10/2024   HCT 38.0 07/10/2024   MCV 94.5 07/10/2024   PLT 403 (H) 07/10/2024   Lab Results  Component Value Date   IRON  <10 (L) 04/04/2024   TIBC 405 04/04/2024   IRONPCTSAT NOT CALCULATED 04/04/2024   Lab Results  Component Value Date   FERRITIN 13 04/04/2024     STUDIES: No results  found.   ASSESSMENT: Iron  deficiency anemia.  PLAN:    Iron  deficiency anemia: Likely secondary to GI bleed.  Patient underwent EGD on September 16, 2023 that revealed multiple angiodysplastic lesions with recent stigmata of bleeding throughout the entire duodenum.  These were subsequently treated with argon plasma coagulation.  Recommendation at that time was to repeat endoscopy in 6 months when patient was able to hold Plavix .  She also underwent bone marrow biopsy on Dec 09, 2023 that only revealed a hypercellular bone marrow with relative relative erythroid hyperplasia suggestive of compensatory hyperplasia.  Myeloid series was reported as unremarkable.  Cytogenetics was normal as well.  Patient continues to have decreased hemoglobin and iron  stores, therefore proceed with 200 mg IV Venofer  today.  Return to clinic 4 times over the next 1 to 2 weeks for additional treatment.  Patient will then return to clinic in 3 months with repeat laboratory work, further evaluation, and consideration of additional treatment if necessary.  A referral has also been sent back to GI for further evaluation.   GI bleed: Patient will require follow-up with GI in the near future. Cardiology: Continue follow-up with cardiology as scheduled.  Patient reports she likely will require pacemaker in the near future.    I spent a total of 30 minutes reviewing chart data, face-to-face evaluation with the patient, counseling and coordination of care as detailed above.   Patient expressed understanding and was in agreement with this plan. She also understands that She can call clinic at  any time with any questions, concerns, or complaints.    Evalene JINNY Reusing, MD   07/10/2024 1:16 PM

## 2024-07-10 NOTE — Patient Instructions (Signed)

## 2024-07-11 ENCOUNTER — Encounter: Payer: Self-pay | Admitting: Oncology

## 2024-07-16 ENCOUNTER — Inpatient Hospital Stay

## 2024-07-16 VITALS — BP 153/77 | HR 63 | Temp 97.3°F | Resp 18

## 2024-07-16 DIAGNOSIS — D500 Iron deficiency anemia secondary to blood loss (chronic): Secondary | ICD-10-CM

## 2024-07-16 DIAGNOSIS — D509 Iron deficiency anemia, unspecified: Secondary | ICD-10-CM | POA: Diagnosis not present

## 2024-07-16 MED ORDER — IRON SUCROSE 20 MG/ML IV SOLN
200.0000 mg | Freq: Once | INTRAVENOUS | Status: AC
  Administered 2024-07-16: 200 mg via INTRAVENOUS
  Filled 2024-07-16: qty 10

## 2024-07-16 NOTE — Patient Instructions (Signed)

## 2024-07-19 ENCOUNTER — Inpatient Hospital Stay

## 2024-07-19 VITALS — BP 137/90 | HR 75 | Temp 97.6°F | Resp 16

## 2024-07-19 DIAGNOSIS — D509 Iron deficiency anemia, unspecified: Secondary | ICD-10-CM | POA: Diagnosis not present

## 2024-07-19 DIAGNOSIS — D500 Iron deficiency anemia secondary to blood loss (chronic): Secondary | ICD-10-CM

## 2024-07-19 MED ORDER — IRON SUCROSE 20 MG/ML IV SOLN
200.0000 mg | Freq: Once | INTRAVENOUS | Status: AC
  Administered 2024-07-19: 200 mg via INTRAVENOUS
  Filled 2024-07-19: qty 10

## 2024-07-19 NOTE — Patient Instructions (Signed)

## 2024-07-23 ENCOUNTER — Inpatient Hospital Stay

## 2024-07-23 VITALS — BP 120/77 | HR 64 | Temp 97.8°F | Resp 18

## 2024-07-23 DIAGNOSIS — D509 Iron deficiency anemia, unspecified: Secondary | ICD-10-CM | POA: Diagnosis not present

## 2024-07-23 DIAGNOSIS — D500 Iron deficiency anemia secondary to blood loss (chronic): Secondary | ICD-10-CM

## 2024-07-23 MED ORDER — IRON SUCROSE 20 MG/ML IV SOLN
200.0000 mg | Freq: Once | INTRAVENOUS | Status: AC
  Administered 2024-07-23: 14:00:00 200 mg via INTRAVENOUS

## 2024-07-23 NOTE — Patient Instructions (Signed)

## 2024-08-14 ENCOUNTER — Encounter (INDEPENDENT_AMBULATORY_CARE_PROVIDER_SITE_OTHER): Payer: Self-pay | Admitting: Vascular Surgery

## 2024-08-14 ENCOUNTER — Ambulatory Visit (INDEPENDENT_AMBULATORY_CARE_PROVIDER_SITE_OTHER): Admitting: Vascular Surgery

## 2024-08-14 ENCOUNTER — Ambulatory Visit (INDEPENDENT_AMBULATORY_CARE_PROVIDER_SITE_OTHER)

## 2024-08-14 ENCOUNTER — Encounter: Payer: Self-pay | Admitting: Oncology

## 2024-08-14 VITALS — BP 110/69 | HR 83 | Resp 18 | Wt 93.6 lb

## 2024-08-14 DIAGNOSIS — I10 Essential (primary) hypertension: Secondary | ICD-10-CM

## 2024-08-14 DIAGNOSIS — I70219 Atherosclerosis of native arteries of extremities with intermittent claudication, unspecified extremity: Secondary | ICD-10-CM

## 2024-08-14 DIAGNOSIS — E1169 Type 2 diabetes mellitus with other specified complication: Secondary | ICD-10-CM

## 2024-08-14 DIAGNOSIS — Z96649 Presence of unspecified artificial hip joint: Secondary | ICD-10-CM

## 2024-08-14 DIAGNOSIS — E785 Hyperlipidemia, unspecified: Secondary | ICD-10-CM

## 2024-08-14 DIAGNOSIS — E782 Mixed hyperlipidemia: Secondary | ICD-10-CM

## 2024-08-14 NOTE — Progress Notes (Signed)
 Established Patient Visit   Chief Complaint: Palpitations  Date of Service: 08/14/2024 Date of Birth: 20-Feb-1946 PCP: Cleotilde Oneil Novel, MD  History of Present Illness: Yolanda Jimenez is a 79 y.o.female patient who presented for palpitations  Past medical history significant for 50-pack-year smoking history-still smokes, peripheral arterial disease with claudication symptoms status post endarterectomy/stent, COPD, hyperlipidemia, CAD as noted by coronary artery calcification on PET/CT stress test.  PET/CT stress test 05/2023 with no ischemia.  Normal left ventricular systolic function, no TID.  Mildly reduced coronary flow reserve likely secondary to microvascular disease. Echocardiogram with normal biventricular systolic function, LVEF greater than 55% with mild aortic stenosis.  Prior Holter in 2024 showed frequent PACs of 3%.  Holter 05/2024 predominantly sinus rhythm, frequent PACs with burden of 7%.  Short lasting SVT without any associated symptoms.  In the past Cardizem  was added to metoprolol  for palpitations but this dropped her blood pressure/heart rate.  Then Cardizem  was discontinued and started on amiodarone.  Recently amiodarone discontinued with memory issues/polypharmacy  Patient details that she is overall doing fine.  Having some leg pain issues.  Gets some palpitations with increased ambulation.  Otherwise no significant palpitation.  No chest pain/pressure.  Has mild chronic exertional dyspnea which is unchanged.  Past Medical and Surgical History  Past Medical History Past Medical History:  Diagnosis Date   Abnormal cytology 2015?   Anemia 2010   ongoing   Anxiety 2021   Arthritis    Cataract cortical, senile 09/2013   have had surgery both eyes   COPD (chronic obstructive pulmonary disease) (CMS/HHS-HCC) ?   In my chart-not mentioned to me   GERD (gastroesophageal reflux disease) 2010?   Glaucoma (increased eye pressure)    History of abnormal cervical Pap  smear 2015?   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 Carotid stenosis.  b. Femoral and distal aortic disease.  c. Prior ischemic right toe.  [facility])    Past Surgical History She has a past surgical history that includes Cholecystectomy; Cervical cone biopsy; Foot surgery (Right, 1997); Cataract extraction (Bilateral, 10/2013, 11/2013); Fracture surgery (1987); Tubal ligation (1985); Left hip unipolar hemiarthroplasty (Left, 05/16/2018); ORIF of displaced left greater trochanteric fracture (Left, 12/10/2021); and other surgery (Left).   Medications and Allergies  Current Medications  Current Outpatient Medications  Medication Sig Dispense Refill   cholecalciferol , vitamin D3, (VITAMIN D3) 125 mcg (5,000 unit) tablet Take 1 tablet by mouth once daily     clopidogreL  (PLAVIX ) 75 mg tablet Take 75 mg by mouth once daily     cyanocobalamin  (VITAMIN B12) 1000 MCG tablet Take 1,000 mcg by mouth once daily     denosumab  (PROLIA ) 60 mg/mL inj syringe Inject subcutaneously every 6 (six) months     donepeziL  (ARICEPT ) 10 MG tablet Take 1 tablet (10 mg total) by mouth at bedtime 90 tablet 3   multivitamin with minerals, EYE, (PRESERVISION AREDS-2) soft gel capsule Take 2 capsules by mouth once daily     rosuvastatin  (CRESTOR ) 20 MG tablet Take 1 tablet (20 mg total) by mouth once daily 90 tablet 3  No current facility-administered medications for this visit.    Allergies: Hydrocodone, Lidocaine , Nsaids (non-steroidal anti-inflammatory drug), Oxycodone , and Vicodin  [hydrocodone-acetaminophen ]  Social and Family History  Social History  reports that she has been smoking cigarettes. She has a 50 pack-year smoking history. She has been exposed to tobacco smoke. She has never used smokeless tobacco. She reports current alcohol use of about 2.0 standard drinks of alcohol per week. She reports that she does not use drugs.  Family History Family History  Problem Relation Name Age of Onset   Myocardial Infarction (Heart attack) Mother Earnie    High blood pressure (Hypertension) Mother Earnie    Diabetes type II Mother Earnie    Coronary Artery Disease (Blocked arteries around heart) Mother Earnie    Multiple myeloma Father     Peripheral vascular disease Sister Delphine    Diabetes type II Sister Delphine    Skin cancer Sister Delphine        Squamous cell   Hip fracture Sister Delphine    Thyroid  disease Sister Delphine    Diabetes type II Son Fairy    Hyperlipidemia (Elevated cholesterol) Son Fairy    High blood pressure (Hypertension) Son Fairy    Diabetes type II Maternal Grandmother Louise     Review of Systems   Review of Systems:  Palpitations, dizziness  Physical Examination   Vitals:BP 100/70 (BP Location: Left upper arm, Patient Position: Sitting, BP Cuff Size: Adult)   Pulse 83   Resp 12   Ht 157.5 cm (5' 2)   Wt (!) 42.2 kg (93 lb)   SpO2 99%   BMI 17.01 kg/m  Ht:157.5 cm (5' 2) Wt:(!) 42.2 kg (93 lb) ADJ:Anib surface area is 1.36 meters squared. Body mass index is 17.01 kg/m.  HEENT: Pupils equally reactive to light and accomodation  Neck: Supple, no significant JVD Lungs: clear to auscultation bilaterally; no wheezes, rales, rhonchi Heart: Regular rate and rhythm.  Grade 2 systolic murmur aortic area Extremities: no pedal edema  Assessment and Plan   79 y.o. female with CAD as noted by elevated coronary calcification on PET/CT without anginal symptoms PET/CT stress test with no ischemia  05/2023 Palpitations, improved Frequent PACs/short lasting SVT with burden of 7% NSVT, PVC burden less than 1% Hyperlipidemia Aortic atherosclerosis Peripheral vascular disease 50-pack-year smoking history, continues to smoke COPD  Overall stable from cardiac standpoint without anginal symptoms. Palpitation reasonably well-controlled.  Recently amiodarone was discontinued by PCP with memory issues/concern for polypharmacy.  Cardizem  stopped in the past with bradycardia issues. Her symptoms are reasonably controlled.  Also on monitor she has PACs and short lasting SVT but they are not associated with symptoms.  So I do not strongly feel need for rate control medications/amiodarone at this time. Continue to monitor. Continue low-dose aspirin  81 mg daily. LDL at goal, continue Crestor .  No orders of the defined types were placed in this encounter.   Return in about 6 months (around 02/11/2025).  KRISHNA CHAITANYA ALLURI, MD  This dictation was prepared with dragon dictation. Any transcription errors that result from this process are unintentional.

## 2024-08-14 NOTE — Progress Notes (Signed)
 "   MRN : [id number]  Yolanda Jimenez is a 79 y.o. ([birth year]) female who presents with chief complaint of  Chief Complaint  Patient presents with   Follow-up    6 month abi follow up  .  History of Present Illness:   Discussed the use of AI scribe software for clinical note transcription with the patient, who gave verbal consent to proceed.  History of Present Illness Yolanda Jimenez is an 79 year old female with peripheral artery disease status post vascular intervention with femoral endarterectomy and Endologix stent graft placement for severe aortoiliac disease who presents with persistent left lower extremity weakness and pain limiting ambulation.  She reports ongoing left leg weakness and pain since her arterial plaque removal procedure. Symptoms involve the entire left leg, worsen with walking, and limit her ability to walk longer distances. The leg may give out with prolonged activity such as grocery shopping, causing difficulty with daily tasks and occasional stumbling.  She also has intermittent left hip pain with certain movements, described as a lightning-bolt sensation. She previously had bilateral hip fracture treated surgically. She has not had recent orthopedic evaluation or new therapies since those surgeries.    Results ABIs today were 1.15 on the right and 1.13 on the left with biphasic waveforms  Current Outpatient Medications  Medication Sig Dispense Refill   acetaminophen  (TYLENOL ) 500 MG tablet Take 1,000 mg by mouth every 6 (six) hours as needed.     buPROPion  (WELLBUTRIN ) 75 MG tablet Take 75 mg by mouth every morning.     Cholecalciferol  125 MCG (5000 UT) TABS Take 1 tablet by mouth daily.     ciprofloxacin  (CIPRO ) 500 MG tablet Take 500 mg by mouth 2 (two) times daily.     clopidogrel  (PLAVIX ) 75 MG tablet Take 1 tablet (75 mg total) by mouth daily. 30 tablet 6   cyanocobalamin  1000 MCG tablet Take 1,000 mcg by mouth daily.     denosumab  (PROLIA ) 60 MG/ML  SOSY injection Inject 60 mg into the skin every 6 (six) months.     diazepam  (VALIUM ) 5 MG tablet Take 5 mg by mouth 2 (two) times daily as needed.     diltiazem  (CARDIZEM  CD) 120 MG 24 hr capsule Take 120 mg by mouth daily.     donepezil  (ARICEPT ) 5 MG tablet Take 1 tablet by mouth at bedtime.     Ferrous Gluconate  239 (27 Fe) MG TABS Take 1 tablet by mouth daily.     fluticasone  (FLONASE ) 50 MCG/ACT nasal spray Place 1 spray into both nostrils daily as needed for allergies or rhinitis.     loperamide  (IMODIUM ) 2 MG capsule Take 2 mg by mouth every morning.     memantine (NAMENDA) 5 MG tablet Take 5 mg by mouth 2 (two) times daily.     metoprolol  succinate (TOPROL -XL) 25 MG 24 hr tablet Take 1 tablet by mouth daily. Added 1/2 tablet at night as well     pantoprazole  (PROTONIX ) 40 MG tablet Take 40 mg by mouth daily.     sertraline  (ZOLOFT ) 50 MG tablet Take 50 mg by mouth daily.     trospium  (SANCTURA ) 20 MG tablet Take 1 tablet (20 mg total) by mouth at bedtime. 30 tablet 11   rosuvastatin  (CRESTOR ) 20 MG tablet Take 20 mg by mouth daily. (Patient not taking: Reported on 08/14/2024)     No current facility-administered medications for this visit.    Past Medical History:  Diagnosis Date  A-fib [facility])    a.) CHA2DS2-VASc = 5 (age x2, sex, vascular disease history, T2DM) as of 08/23/2023; b.) cardiac rate/rhythm maintained on oral metoprolol  succinate; no chronic OAC   Abnormal vaginal Pap smear 2015   Anxiety    a.) on BZO PRN (diazepam )   Aortic atherosclerosis    Aortic stenosis 05/03/2023   a.) TTE 05/03/2023: mild AS (MPG 12 mmHg; AVA = 1.3 cm2)   Arthritis of spine    Asthma    Barrett esophagus    Benign hematuria 08/29/2014   W/u neg  Normal renal ultrasound 2/19  Cystoscopy normal 2019, [place]  CT -9/24     Bilateral carotid artery disease    a.) carotid dopplers 10/13/2016, 10/24/2018, 10/26/2019, 10/21/2020, 10/22/2022: 1-39% BICA   C. difficile diarrhea    CAD (coronary  artery disease)    a.) CT chest 07/12/2018: LAD calcifications; b.) CT CAP 11/18/2022: 3 vessel CAD; c.) cPET 05/19/2023: severe 4 vessel coronary calcs mainly in LAD distribution; d.) cPET 05/03/2023: small/mild reversible defect in mid-anterosep/ant region   Complication of anesthesia    a.) delayed emergence; b.) emergence delirium; I get really loopy after surgery   COPD (chronic obstructive pulmonary disease) (HCC)    DDD (degenerative disc disease), lumbosacral    Depression    Diverticulosis    Epigastric hernia    GERD (gastroesophageal reflux disease)    Glaucoma    Heart murmur    Heart palpitations    History of bilateral cataract extraction 2015   History of recurrent UTIs    Hx of adenomatous colonic polyps    Hyperlipidemia    IBS (irritable bowel syndrome)    Insomnia    Iron  deficiency anemia    Kidney cysts    Long term current use of aspirin     Macular degeneration of left eye    Osteoporosis    a.) on RANKLi (denosumab )   PVD (peripheral vascular disease) with claudication    Small bowel arteriovenous malformation 2007   a.) noted on VCE in 2007   Type 2 diabetes, diet controlled (HCC)    Wears dentures    partial lower    Past Surgical History:  Procedure Laterality Date   ABDOMINAL AORTIC ENDOVASCULAR STENT GRAFT N/A 08/24/2023   Procedure: ABDOMINAL AORTIC ENDOVASCULAR STENT GRAFT;  Surgeon: Marea Selinda RAMAN, MD;  Location: [facility];  Service: Vascular;  Laterality: N/A;   BUNIONECTOMY Right    CATARACT EXTRACTION Bilateral    CERVICAL CONE BIOPSY     CHOLECYSTECTOMY     COLONOSCOPY     COLONOSCOPY WITH PROPOFOL  N/A 01/22/2019   Procedure: COLONOSCOPY WITH BIOPSY;  Surgeon: Jinny Carmine, MD;  Location: [facility];  Service: Endoscopy;  Laterality: N/A;  diabetic - diet controlled   COLONOSCOPY WITH PROPOFOL  N/A 06/27/2020   Procedure: COLONOSCOPY WITH BIOPSY;  Surgeon: Jinny Carmine, MD;  Location: [facility];  Service: Endoscopy;   Laterality: N/A;  priority 4   ENDARTERECTOMY FEMORAL Bilateral 08/24/2023   Procedure: ENDARTERECTOMY FEMORAL;  Surgeon: Marea Selinda RAMAN, MD;  Location: [facility];  Service: Vascular;  Laterality: Bilateral;   ESOPHAGOGASTRODUODENOSCOPY (EGD) WITH PROPOFOL  N/A 08/29/2017   Procedure: ESOPHAGOGASTRODUODENOSCOPY (EGD) WITH PROPOFOL ;  Surgeon: Jinny Carmine, MD;  Location: [facility];  Service: Endoscopy;  Laterality: N/A;   ESOPHAGOGASTRODUODENOSCOPY (EGD) WITH PROPOFOL  N/A 09/16/2023   Procedure: ESOPHAGOGASTRODUODENOSCOPY (EGD) WITH PROPOFOL ;  Surgeon: Jinny Carmine, MD;  Location: [facility];  Service: Endoscopy;  Laterality: N/A;   FOOT SURGERY  right 2nd toe   HIP ARTHROPLASTY Left 05/16/2018   Procedure: ARTHROPLASTY BIPOLAR HIP (HEMIARTHROPLASTY);  Surgeon: Edie Norleen PARAS, MD;  Location: [facility];  Service: Orthopedics;  Laterality: Left;   HOT HEMOSTASIS  09/16/2023   Procedure: HOT HEMOSTASIS (ARGON PLASMA COAGULATION/BICAP);  Surgeon: Jinny Carmine, MD;  Location: [facility] END[facility];  Service: Endoscopy;;   INSERTION OF ILIAC STENT Bilateral 08/24/2023   Procedure: INSERTION OF ILIAC STENT;  Surgeon: Marea Selinda RAMAN, MD;  Location: [facility];  Service: Vascular;  Laterality: Bilateral;   IR BONE MARROW BIOPSY & ASPIRATION  12/09/2023   LOWER EXTREMITY ANGIOGRAPHY Left 04/14/2023   Procedure: Lower Extremity Angiography;  Surgeon: Marea Selinda RAMAN, MD;  Location: [facility];  Service: Cardiovascular;  Laterality: Left;   ORIF PERIPROSTHETIC FRACTURE Left 12/10/2021   Procedure: OPEN REDUCTION INTERNAL FIXATION OF A LEFT GREATER TROCHANTERIC FRACTURE;  Surgeon: Edie Norleen PARAS, MD;  Location: [facility];  Service: Orthopedics;  Laterality: Left;   ORIF WRIST FRACTURE Right 10/07/2020   Procedure: OPEN REDUCTION INTERNAL FIXATION (ORIF) WRIST FRACTURE;  Surgeon: Kathlynn Sharper, MD;  Location: [facility];  Service: Orthopedics;  Laterality: Right;   POLYPECTOMY N/A 01/22/2019   Procedure:  POLYPECTOMY;  Surgeon: Jinny Carmine, MD;  Location: [facility];  Service: Endoscopy;  Laterality: N/A;  2 Clips placed at polyp removal site in ascending colon   POLYPECTOMY N/A 06/27/2020   Procedure: POLYPECTOMY;  Surgeon: Jinny Carmine, MD;  Location: [facility];  Service: Endoscopy;  Laterality: N/A;   TUBAL LIGATION     UPPER GASTROINTESTINAL ENDOSCOPY       Social History[1]    Family History  Problem Relation Age of Onset   Diabetes Mother    Heart disease Mother    Hypertension Mother    Diabetes Son    Diabetes Sister    Inflammatory bowel disease Father        diverticulitis   Multiple myeloma Father    Hypertension Son    Diabetes Maternal Grandmother    Colon cancer Neg Hx    Esophageal cancer Neg Hx    Rectal cancer Neg Hx    Stomach cancer Neg Hx    Breast cancer Neg Hx      Allergies[2]   REVIEW OF SYSTEMS (Negative unless checked)   Constitutional: [] Weight loss  [] Fever  [] Chills Cardiac: [] Chest pain   [] Chest pressure   [x] Palpitations   [] Shortness of breath when laying flat   [] Shortness of breath at rest   [] Shortness of breath with exertion. Vascular:  [x] Pain in legs with walking   [] Pain in legs at rest   [] Pain in legs when laying flat   [x] Claudication   [] Pain in feet when walking  [] Pain in feet at rest  [] Pain in feet when laying flat   [] History of DVT   [] Phlebitis   [] Swelling in legs   [] Varicose veins   [] Non-healing ulcers Pulmonary:   [] Uses home oxygen   [] Productive cough   [] Hemoptysis   [] Wheeze  [x] COPD   [] Asthma Neurologic:  [] Dizziness  [] Blackouts   [] Seizures   [] History of stroke   [] History of TIA  [] Aphasia   [] Temporary blindness   [] Dysphagia   [] Weakness or numbness in arms   [] Weakness or numbness in legs Musculoskeletal:  [x] Arthritis   [] Joint swelling   [x] Joint pain   [] Low back pain Hematologic:  [] Easy bruising  [] Easy bleeding   [] Hypercoagulable state   [] Anemic   Gastrointestinal:  [] Blood in  stool   [] Vomiting blood  [x] Gastroesophageal reflux/heartburn   [] Abdominal pain Genitourinary:  [] Chronic kidney disease   [] Difficult urination  [] Frequent urination  [] Burning with urination   [] Hematuria Skin:  [] Rashes   [] Ulcers   [] Wounds Psychological:  [] History of anxiety   []  History of major depression.  Physical Examination  BP 110/69 (BP Location: Left Arm)   Pulse 83   Resp 18   Wt 93 lb 9.6 oz (42.5 kg)   BMI 17.69 kg/m  Gen:  WD/WN, NAD Head: [place]/AT, No temporalis wasting. Ear/Nose/Throat: Hearing grossly intact, nares w/o erythema or drainage Eyes: Conjunctiva clear. Sclera non-icteric Neck: Supple.  Trachea midline Pulmonary:  Good air movement, no use of accessory muscles.  Cardiac: RRR, no JVD Vascular:  Vessel Right Left  Radial Palpable Palpable                          PT Palpable Palpable  DP Palpable Palpable    Musculoskeletal: M/S 5/5 throughout.  No deformity or atrophy. No edema. Neurologic: Sensation grossly intact in extremities.  Symmetrical.  Speech is fluent.  Psychiatric: Judgment intact, Mood & affect appropriate for pt's clinical situation. Dermatologic: No rashes or ulcers noted.  No cellulitis or open wounds.  Physical Exam     Labs Recent Results (from the past 2160 hours)  Iron  and TIBC     Status: Abnormal   Collection Time: 07/10/24 12:50 PM  Result Value Ref Range   Iron  34 28 - 170 ug/dL   TIBC 631 749 - 549 ug/dL   Saturation Ratios 9 (L) 10.4 - 31.8 %   UIBC 335 ug/dL    Comment: Performed at [facility], [address] [place], [place] [address]  Ferritin     Status: None   Collection Time: 07/10/24 12:50 PM  Result Value Ref Range   Ferritin 72 11 - 307 ng/mL    Comment: Performed at [facility], [address] [place], [place] [address]  CBC with Differential/Platelet     Status: Abnormal   Collection Time: 07/10/24 12:50 PM  Result Value Ref Range   WBC 8.5 4.0 - 10.5 K/uL   RBC  4.02 3.87 - 5.11 MIL/uL   Hemoglobin 12.1 12.0 - 15.0 g/dL   HCT 61.9 63.9 - 53.9 %   MCV 94.5 80.0 - 100.0 fL   MCH 30.1 26.0 - 34.0 pg   MCHC 31.8 30.0 - 36.0 g/dL   RDW 80.0 (H) 88.4 - 84.4 %   Platelets 403 (H) 150 - 400 K/uL   nRBC 0.0 0.0 - 0.2 %   Neutrophils Relative % 75 %   Neutro Abs 6.5 1.7 - 7.7 K/uL   Lymphocytes Relative 14 %   Lymphs Abs 1.2 0.7 - 4.0 K/uL   Monocytes Relative 8 %   Monocytes Absolute 0.7 0.1 - 1.0 K/uL   Eosinophils Relative 1 %   Eosinophils Absolute 0.1 0.0 - 0.5 K/uL   Basophils Relative 1 %   Basophils Absolute 0.1 0.0 - 0.1 K/uL   Immature Granulocytes 1 %   Abs Immature Granulocytes 0.05 0.00 - 0.07 K/uL    Comment: Performed at [facility], [address] [place], [place] [address]    Radiology No results found.  Assessment/Plan Assessment & Plan Peripheral artery disease with intermittent claudication Peripheral artery disease with persistent symptoms post-revascularization. Left leg symptoms likely musculoskeletal or neuropathic, not vascular. - Referred to orthopedic surgery for  evaluation of left leg and hip pain, including imaging and assessment for musculoskeletal causes. - Planned follow-up vascular assessment in six months to monitor arterial status.  HTN (hypertension) blood pressure control important in reducing the progression of atherosclerotic disease. On appropriate oral medications.     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 Also, involved in wound healing. On appropriate medications.     Hyperlipidemia lipid control important in reducing the progression of atherosclerotic disease. Continue statin therapy    Selinda Gu, MD  08/14/2024 10:50 AM    This note was created with Dragon medical transcription system.  Any errors from dictation are purely unintentional     [1]  Social History Tobacco  Use   Smoking status: Every Day    Current packs/day: 0.50    Average packs/day: 0.5 packs/day for 55.0 years (27.5 ttl pk-yrs)    Types: Cigarettes   Smokeless tobacco: Never   Tobacco comments:    she has patches to start just not started yet. smoked since teenager.  Vaping Use   Vaping status: Never Used  Substance Use Topics   Alcohol use: Yes    Alcohol/week: 2.0 standard drinks of alcohol    Types: 2 Glasses of wine per week    Comment: occ wine 1-2 weekly   Drug use: No  [2]  Allergies Allergen Reactions   Codeine     felt drunk, crazy   Hydrocodone Other (See Comments)   Hydrocodone-Acetaminophen  Other (See Comments)    Confusion/delirium   Lidocaine      Heart racing, increased BP - from local at dentist (likely epi)   Nsaids Other (See Comments)    Avoids due to IBS symptoms (bleeding symptoms)   Oxycodone      Confusion/delirium   "

## 2024-08-15 LAB — VAS US ABI WITH/WO TBI
Left ABI: 1.13
Right ABI: 1.15

## 2024-09-06 ENCOUNTER — Ambulatory Visit: Admitting: Physician Assistant

## 2024-09-11 ENCOUNTER — Ambulatory Visit (INDEPENDENT_AMBULATORY_CARE_PROVIDER_SITE_OTHER): Admitting: Physician Assistant

## 2024-09-11 ENCOUNTER — Encounter: Payer: Self-pay | Admitting: Physician Assistant

## 2024-09-11 VITALS — BP 164/96 | HR 80 | Ht 62.0 in | Wt 95.0 lb

## 2024-09-11 DIAGNOSIS — R3129 Other microscopic hematuria: Secondary | ICD-10-CM

## 2024-09-11 DIAGNOSIS — R351 Nocturia: Secondary | ICD-10-CM | POA: Diagnosis not present

## 2024-09-11 NOTE — Progress Notes (Signed)
 Yolanda Jimenez presents for an office visit. BP today is 164/96. She is complaint with BP medication. Greater than 140/90. Provider  notified. Pt advised to follow up with her Primary care doctor about her elevated blood pressure. Pt voiced understanding.

## 2024-11-19 ENCOUNTER — Inpatient Hospital Stay

## 2024-11-20 ENCOUNTER — Inpatient Hospital Stay: Admitting: Oncology

## 2024-11-20 ENCOUNTER — Inpatient Hospital Stay

## 2025-02-12 ENCOUNTER — Ambulatory Visit (INDEPENDENT_AMBULATORY_CARE_PROVIDER_SITE_OTHER): Admitting: Vascular Surgery

## 2025-02-12 ENCOUNTER — Encounter (INDEPENDENT_AMBULATORY_CARE_PROVIDER_SITE_OTHER)

## 2025-09-11 ENCOUNTER — Ambulatory Visit: Admitting: Physician Assistant
